# Patient Record
Sex: Female | Born: 1945 | Race: White | Hispanic: No | Smoking: Never smoker
Health system: Southern US, Community
[De-identification: ages and names within clinical notes are randomized; demographics above are authoritative.]

## PROBLEM LIST (undated history)

## (undated) DIAGNOSIS — I7300 Raynaud's syndrome without gangrene: Secondary | ICD-10-CM

## (undated) DIAGNOSIS — I781 Nevus, non-neoplastic: Secondary | ICD-10-CM

## (undated) DIAGNOSIS — I82409 Acute embolism and thrombosis of unspecified deep veins of unspecified lower extremity: Secondary | ICD-10-CM

## (undated) DIAGNOSIS — R51 Headache: Secondary | ICD-10-CM

## (undated) DIAGNOSIS — C4491 Basal cell carcinoma of skin, unspecified: Secondary | ICD-10-CM

## (undated) DIAGNOSIS — R519 Headache, unspecified: Secondary | ICD-10-CM

## (undated) DIAGNOSIS — Z8701 Personal history of pneumonia (recurrent): Secondary | ICD-10-CM

## (undated) DIAGNOSIS — R05 Cough: Secondary | ICD-10-CM

## (undated) DIAGNOSIS — I219 Acute myocardial infarction, unspecified: Secondary | ICD-10-CM

## (undated) DIAGNOSIS — E0590 Thyrotoxicosis, unspecified without thyrotoxic crisis or storm: Secondary | ICD-10-CM

## (undated) DIAGNOSIS — R112 Nausea with vomiting, unspecified: Secondary | ICD-10-CM

## (undated) DIAGNOSIS — K5090 Crohn's disease, unspecified, without complications: Secondary | ICD-10-CM

## (undated) DIAGNOSIS — M1990 Unspecified osteoarthritis, unspecified site: Secondary | ICD-10-CM

## (undated) DIAGNOSIS — I5022 Chronic systolic (congestive) heart failure: Secondary | ICD-10-CM

## (undated) DIAGNOSIS — Z9889 Other specified postprocedural states: Secondary | ICD-10-CM

## (undated) DIAGNOSIS — IMO0002 Reserved for concepts with insufficient information to code with codable children: Secondary | ICD-10-CM

## (undated) DIAGNOSIS — I341 Nonrheumatic mitral (valve) prolapse: Secondary | ICD-10-CM

## (undated) DIAGNOSIS — I5181 Takotsubo syndrome: Secondary | ICD-10-CM

## (undated) DIAGNOSIS — R053 Chronic cough: Secondary | ICD-10-CM

## (undated) DIAGNOSIS — I10 Essential (primary) hypertension: Secondary | ICD-10-CM

## (undated) HISTORY — PX: COLONOSCOPY: SHX174

## (undated) HISTORY — PX: SQUAMOUS CELL CARCINOMA EXCISION: SHX2433

## (undated) HISTORY — DX: Headache, unspecified: R51.9

## (undated) HISTORY — DX: Crohn's disease, unspecified, without complications: K50.90

## (undated) HISTORY — DX: Nevus, non-neoplastic: I78.1

## (undated) HISTORY — PX: CARDIAC CATHETERIZATION: SHX172

## (undated) HISTORY — DX: Raynaud's syndrome without gangrene: I73.00

## (undated) HISTORY — DX: Chronic systolic (congestive) heart failure: I50.22

## (undated) HISTORY — DX: Thyrotoxicosis, unspecified without thyrotoxic crisis or storm: E05.90

## (undated) HISTORY — DX: Chronic cough: R05.3

## (undated) HISTORY — DX: Personal history of pneumonia (recurrent): Z87.01

## (undated) HISTORY — DX: Takotsubo syndrome: I51.81

## (undated) HISTORY — PX: BASAL CELL CARCINOMA EXCISION: SHX1214

## (undated) HISTORY — DX: Headache: R51

## (undated) HISTORY — DX: Cough: R05

## (undated) HISTORY — DX: Essential (primary) hypertension: I10

## (undated) HISTORY — DX: Nonrheumatic mitral (valve) prolapse: I34.1

## (undated) HISTORY — DX: Acute embolism and thrombosis of unspecified deep veins of unspecified lower extremity: I82.409

---

## 1969-03-21 DIAGNOSIS — R112 Nausea with vomiting, unspecified: Secondary | ICD-10-CM

## 1969-03-21 DIAGNOSIS — Z9889 Other specified postprocedural states: Secondary | ICD-10-CM

## 1969-03-21 HISTORY — DX: Nausea with vomiting, unspecified: R11.2

## 1969-03-21 HISTORY — DX: Nausea with vomiting, unspecified: Z98.890

## 1969-07-21 HISTORY — PX: APPENDECTOMY: SHX54

## 1971-07-22 HISTORY — PX: TUBAL LIGATION: SHX77

## 1994-07-21 HISTORY — PX: SHOULDER ARTHROSCOPY W/ ROTATOR CUFF REPAIR: SHX2400

## 1998-03-21 ENCOUNTER — Ambulatory Visit (HOSPITAL_COMMUNITY): Admission: RE | Admit: 1998-03-21 | Discharge: 1998-03-21 | Payer: Self-pay | Admitting: Internal Medicine

## 1998-03-21 ENCOUNTER — Encounter: Payer: Self-pay | Admitting: Internal Medicine

## 1998-03-23 ENCOUNTER — Ambulatory Visit (HOSPITAL_BASED_OUTPATIENT_CLINIC_OR_DEPARTMENT_OTHER): Admission: RE | Admit: 1998-03-23 | Discharge: 1998-03-23 | Payer: Self-pay | Admitting: Plastic Surgery

## 1998-04-09 ENCOUNTER — Ambulatory Visit (HOSPITAL_BASED_OUTPATIENT_CLINIC_OR_DEPARTMENT_OTHER): Admission: RE | Admit: 1998-04-09 | Discharge: 1998-04-09 | Payer: Self-pay | Admitting: Plastic Surgery

## 1998-05-07 ENCOUNTER — Encounter (HOSPITAL_COMMUNITY): Admission: RE | Admit: 1998-05-07 | Discharge: 1998-08-05 | Payer: Self-pay | Admitting: Internal Medicine

## 1998-08-02 ENCOUNTER — Ambulatory Visit (HOSPITAL_COMMUNITY): Admission: RE | Admit: 1998-08-02 | Discharge: 1998-08-02 | Payer: Self-pay | Admitting: *Deleted

## 1998-09-24 ENCOUNTER — Ambulatory Visit (HOSPITAL_COMMUNITY): Admission: RE | Admit: 1998-09-24 | Discharge: 1998-09-24 | Payer: Self-pay | Admitting: Cardiology

## 1998-09-24 ENCOUNTER — Encounter: Payer: Self-pay | Admitting: Cardiology

## 1998-11-05 ENCOUNTER — Other Ambulatory Visit: Admission: RE | Admit: 1998-11-05 | Discharge: 1998-11-05 | Payer: Self-pay | Admitting: *Deleted

## 1998-12-05 ENCOUNTER — Ambulatory Visit (HOSPITAL_COMMUNITY): Admission: RE | Admit: 1998-12-05 | Discharge: 1998-12-05 | Payer: Self-pay | Admitting: Family Medicine

## 1998-12-20 ENCOUNTER — Ambulatory Visit (HOSPITAL_COMMUNITY): Admission: RE | Admit: 1998-12-20 | Discharge: 1998-12-20 | Payer: Self-pay | Admitting: Family Medicine

## 1999-04-01 ENCOUNTER — Encounter (HOSPITAL_COMMUNITY): Admission: RE | Admit: 1999-04-01 | Discharge: 1999-06-30 | Payer: Self-pay | Admitting: Internal Medicine

## 1999-04-24 ENCOUNTER — Ambulatory Visit (HOSPITAL_COMMUNITY): Admission: RE | Admit: 1999-04-24 | Discharge: 1999-04-24 | Payer: Self-pay | Admitting: Family Medicine

## 1999-04-24 ENCOUNTER — Encounter: Payer: Self-pay | Admitting: Family Medicine

## 1999-08-06 ENCOUNTER — Ambulatory Visit (HOSPITAL_COMMUNITY): Admission: RE | Admit: 1999-08-06 | Discharge: 1999-08-06 | Payer: Self-pay | Admitting: *Deleted

## 1999-08-06 ENCOUNTER — Encounter: Payer: Self-pay | Admitting: *Deleted

## 1999-10-25 ENCOUNTER — Ambulatory Visit (HOSPITAL_COMMUNITY): Admission: RE | Admit: 1999-10-25 | Discharge: 1999-10-25 | Payer: Self-pay | Admitting: Internal Medicine

## 1999-12-20 ENCOUNTER — Other Ambulatory Visit: Admission: RE | Admit: 1999-12-20 | Discharge: 1999-12-20 | Payer: Self-pay | Admitting: *Deleted

## 2000-05-16 ENCOUNTER — Ambulatory Visit (HOSPITAL_COMMUNITY): Admission: RE | Admit: 2000-05-16 | Discharge: 2000-05-16 | Payer: Self-pay | Admitting: Neurosurgery

## 2000-05-16 ENCOUNTER — Encounter: Payer: Self-pay | Admitting: Neurosurgery

## 2000-06-04 ENCOUNTER — Ambulatory Visit (HOSPITAL_COMMUNITY): Admission: RE | Admit: 2000-06-04 | Discharge: 2000-06-04 | Payer: Self-pay | Admitting: Neurosurgery

## 2000-06-04 ENCOUNTER — Encounter: Payer: Self-pay | Admitting: Neurosurgery

## 2000-08-07 ENCOUNTER — Encounter: Payer: Self-pay | Admitting: General Surgery

## 2000-08-07 ENCOUNTER — Ambulatory Visit (HOSPITAL_COMMUNITY): Admission: RE | Admit: 2000-08-07 | Discharge: 2000-08-07 | Payer: Self-pay | Admitting: General Surgery

## 2000-09-25 ENCOUNTER — Ambulatory Visit (HOSPITAL_COMMUNITY): Admission: RE | Admit: 2000-09-25 | Discharge: 2000-09-25 | Payer: Self-pay | Admitting: Cardiology

## 2000-09-25 ENCOUNTER — Encounter: Payer: Self-pay | Admitting: Cardiology

## 2000-10-06 ENCOUNTER — Ambulatory Visit (HOSPITAL_COMMUNITY): Admission: RE | Admit: 2000-10-06 | Discharge: 2000-10-06 | Payer: Self-pay | Admitting: Cardiology

## 2001-01-14 ENCOUNTER — Other Ambulatory Visit: Admission: RE | Admit: 2001-01-14 | Discharge: 2001-01-14 | Payer: Self-pay | Admitting: *Deleted

## 2001-10-29 ENCOUNTER — Ambulatory Visit (HOSPITAL_COMMUNITY): Admission: RE | Admit: 2001-10-29 | Discharge: 2001-10-29 | Payer: Self-pay | Admitting: *Deleted

## 2001-10-29 ENCOUNTER — Encounter: Payer: Self-pay | Admitting: *Deleted

## 2002-03-09 ENCOUNTER — Other Ambulatory Visit: Admission: RE | Admit: 2002-03-09 | Discharge: 2002-03-09 | Payer: Self-pay | Admitting: *Deleted

## 2002-07-08 ENCOUNTER — Encounter: Payer: Self-pay | Admitting: Neurosurgery

## 2002-07-08 ENCOUNTER — Ambulatory Visit (HOSPITAL_COMMUNITY): Admission: RE | Admit: 2002-07-08 | Discharge: 2002-07-08 | Payer: Self-pay | Admitting: Neurosurgery

## 2002-09-15 ENCOUNTER — Encounter: Payer: Self-pay | Admitting: Orthopedic Surgery

## 2002-09-15 ENCOUNTER — Ambulatory Visit (HOSPITAL_COMMUNITY): Admission: RE | Admit: 2002-09-15 | Discharge: 2002-09-15 | Payer: Self-pay | Admitting: Orthopedic Surgery

## 2002-10-10 ENCOUNTER — Ambulatory Visit (HOSPITAL_BASED_OUTPATIENT_CLINIC_OR_DEPARTMENT_OTHER): Admission: RE | Admit: 2002-10-10 | Discharge: 2002-10-10 | Payer: Self-pay | Admitting: Orthopedic Surgery

## 2002-11-13 ENCOUNTER — Encounter: Payer: Self-pay | Admitting: Orthopedic Surgery

## 2002-11-13 ENCOUNTER — Ambulatory Visit (HOSPITAL_COMMUNITY): Admission: RE | Admit: 2002-11-13 | Discharge: 2002-11-13 | Payer: Self-pay | Admitting: Orthopedic Surgery

## 2003-03-01 ENCOUNTER — Ambulatory Visit (HOSPITAL_COMMUNITY): Admission: RE | Admit: 2003-03-01 | Discharge: 2003-03-01 | Payer: Self-pay | Admitting: *Deleted

## 2003-03-01 ENCOUNTER — Encounter: Payer: Self-pay | Admitting: *Deleted

## 2003-03-24 ENCOUNTER — Ambulatory Visit (HOSPITAL_COMMUNITY): Admission: RE | Admit: 2003-03-24 | Discharge: 2003-03-24 | Payer: Self-pay | Admitting: Internal Medicine

## 2003-03-24 ENCOUNTER — Encounter (INDEPENDENT_AMBULATORY_CARE_PROVIDER_SITE_OTHER): Payer: Self-pay | Admitting: Specialist

## 2003-04-14 ENCOUNTER — Other Ambulatory Visit: Admission: RE | Admit: 2003-04-14 | Discharge: 2003-04-14 | Payer: Self-pay | Admitting: *Deleted

## 2004-02-16 ENCOUNTER — Emergency Department (HOSPITAL_COMMUNITY): Admission: EM | Admit: 2004-02-16 | Discharge: 2004-02-16 | Payer: Self-pay | Admitting: Family Medicine

## 2004-04-30 ENCOUNTER — Ambulatory Visit (HOSPITAL_COMMUNITY): Admission: RE | Admit: 2004-04-30 | Discharge: 2004-04-30 | Payer: Self-pay | Admitting: Obstetrics and Gynecology

## 2004-05-06 ENCOUNTER — Encounter: Admission: RE | Admit: 2004-05-06 | Discharge: 2004-05-06 | Payer: Self-pay | Admitting: Obstetrics and Gynecology

## 2004-08-27 ENCOUNTER — Other Ambulatory Visit: Admission: RE | Admit: 2004-08-27 | Discharge: 2004-08-27 | Payer: Self-pay | Admitting: Obstetrics and Gynecology

## 2005-01-22 ENCOUNTER — Encounter: Admission: RE | Admit: 2005-01-22 | Discharge: 2005-01-22 | Payer: Self-pay | Admitting: Family Medicine

## 2005-01-22 ENCOUNTER — Ambulatory Visit: Payer: Self-pay | Admitting: Family Medicine

## 2005-01-23 ENCOUNTER — Ambulatory Visit: Payer: Self-pay | Admitting: Internal Medicine

## 2005-01-23 ENCOUNTER — Encounter: Admission: RE | Admit: 2005-01-23 | Discharge: 2005-01-23 | Payer: Self-pay | Admitting: Internal Medicine

## 2005-01-23 ENCOUNTER — Ambulatory Visit: Payer: Self-pay | Admitting: Family Medicine

## 2005-01-28 ENCOUNTER — Ambulatory Visit: Payer: Self-pay | Admitting: Internal Medicine

## 2005-02-11 ENCOUNTER — Inpatient Hospital Stay (HOSPITAL_COMMUNITY): Admission: EM | Admit: 2005-02-11 | Discharge: 2005-02-12 | Payer: Self-pay | Admitting: Emergency Medicine

## 2005-03-12 ENCOUNTER — Encounter (INDEPENDENT_AMBULATORY_CARE_PROVIDER_SITE_OTHER): Payer: Self-pay | Admitting: *Deleted

## 2005-03-12 ENCOUNTER — Ambulatory Visit: Payer: Self-pay | Admitting: Internal Medicine

## 2005-03-12 ENCOUNTER — Ambulatory Visit (HOSPITAL_COMMUNITY): Admission: RE | Admit: 2005-03-12 | Discharge: 2005-03-12 | Payer: Self-pay | Admitting: Internal Medicine

## 2005-07-01 ENCOUNTER — Ambulatory Visit (HOSPITAL_COMMUNITY): Admission: RE | Admit: 2005-07-01 | Discharge: 2005-07-01 | Payer: Self-pay | Admitting: Obstetrics and Gynecology

## 2005-11-18 ENCOUNTER — Inpatient Hospital Stay (HOSPITAL_COMMUNITY): Admission: EM | Admit: 2005-11-18 | Discharge: 2005-11-25 | Payer: Self-pay | Admitting: *Deleted

## 2005-11-19 ENCOUNTER — Encounter (INDEPENDENT_AMBULATORY_CARE_PROVIDER_SITE_OTHER): Payer: Self-pay | Admitting: *Deleted

## 2005-12-04 ENCOUNTER — Encounter (HOSPITAL_COMMUNITY): Admission: RE | Admit: 2005-12-04 | Discharge: 2006-02-17 | Payer: Self-pay | Admitting: Cardiology

## 2005-12-10 ENCOUNTER — Ambulatory Visit (HOSPITAL_COMMUNITY): Admission: RE | Admit: 2005-12-10 | Discharge: 2005-12-10 | Payer: Self-pay | Admitting: Cardiology

## 2005-12-10 ENCOUNTER — Encounter (INDEPENDENT_AMBULATORY_CARE_PROVIDER_SITE_OTHER): Payer: Self-pay | Admitting: Cardiology

## 2005-12-18 ENCOUNTER — Emergency Department (HOSPITAL_COMMUNITY): Admission: EM | Admit: 2005-12-18 | Discharge: 2005-12-18 | Payer: Self-pay | Admitting: Emergency Medicine

## 2006-02-23 ENCOUNTER — Encounter (HOSPITAL_COMMUNITY): Admission: RE | Admit: 2006-02-23 | Discharge: 2006-05-21 | Payer: Self-pay | Admitting: Cardiology

## 2006-04-29 ENCOUNTER — Encounter (INDEPENDENT_AMBULATORY_CARE_PROVIDER_SITE_OTHER): Payer: Self-pay | Admitting: *Deleted

## 2006-04-29 ENCOUNTER — Ambulatory Visit (HOSPITAL_COMMUNITY): Admission: RE | Admit: 2006-04-29 | Discharge: 2006-04-29 | Payer: Self-pay | Admitting: Cardiology

## 2006-05-22 ENCOUNTER — Encounter (HOSPITAL_COMMUNITY): Admission: RE | Admit: 2006-05-22 | Discharge: 2006-08-20 | Payer: Self-pay | Admitting: Cardiology

## 2006-07-23 ENCOUNTER — Ambulatory Visit (HOSPITAL_COMMUNITY): Admission: RE | Admit: 2006-07-23 | Discharge: 2006-07-23 | Payer: Self-pay | Admitting: Obstetrics and Gynecology

## 2006-08-21 ENCOUNTER — Encounter (HOSPITAL_COMMUNITY): Admission: RE | Admit: 2006-08-21 | Discharge: 2006-11-19 | Payer: Self-pay | Admitting: Cardiology

## 2006-08-28 ENCOUNTER — Ambulatory Visit (HOSPITAL_COMMUNITY): Admission: RE | Admit: 2006-08-28 | Discharge: 2006-08-28 | Payer: Self-pay | Admitting: Neurology

## 2006-08-28 ENCOUNTER — Emergency Department (HOSPITAL_COMMUNITY): Admission: EM | Admit: 2006-08-28 | Discharge: 2006-08-28 | Payer: Self-pay | Admitting: Emergency Medicine

## 2006-11-20 ENCOUNTER — Encounter (HOSPITAL_COMMUNITY): Admission: RE | Admit: 2006-11-20 | Discharge: 2007-02-18 | Payer: Self-pay | Admitting: Cardiology

## 2006-12-30 ENCOUNTER — Ambulatory Visit (HOSPITAL_COMMUNITY): Admission: RE | Admit: 2006-12-30 | Discharge: 2006-12-30 | Payer: Self-pay | Admitting: Cardiology

## 2007-02-19 ENCOUNTER — Encounter (HOSPITAL_COMMUNITY): Admission: RE | Admit: 2007-02-19 | Discharge: 2007-05-20 | Payer: Self-pay | Admitting: Cardiology

## 2007-04-01 ENCOUNTER — Ambulatory Visit (HOSPITAL_COMMUNITY): Admission: RE | Admit: 2007-04-01 | Discharge: 2007-04-01 | Payer: Self-pay | Admitting: Cardiology

## 2007-05-22 ENCOUNTER — Encounter (HOSPITAL_COMMUNITY): Admission: RE | Admit: 2007-05-22 | Discharge: 2007-07-20 | Payer: Self-pay | Admitting: Cardiology

## 2007-07-22 ENCOUNTER — Encounter (HOSPITAL_COMMUNITY): Admission: RE | Admit: 2007-07-22 | Discharge: 2007-10-14 | Payer: Self-pay | Admitting: Cardiology

## 2007-08-03 ENCOUNTER — Ambulatory Visit (HOSPITAL_COMMUNITY): Admission: RE | Admit: 2007-08-03 | Discharge: 2007-08-03 | Payer: Self-pay | Admitting: Obstetrics and Gynecology

## 2007-08-17 ENCOUNTER — Encounter: Admission: RE | Admit: 2007-08-17 | Discharge: 2007-11-02 | Payer: Self-pay | Admitting: Internal Medicine

## 2007-10-12 ENCOUNTER — Ambulatory Visit: Payer: Self-pay | Admitting: Internal Medicine

## 2007-10-20 ENCOUNTER — Encounter (HOSPITAL_COMMUNITY): Admission: RE | Admit: 2007-10-20 | Discharge: 2008-01-18 | Payer: Self-pay | Admitting: Cardiology

## 2007-10-25 ENCOUNTER — Encounter: Payer: Self-pay | Admitting: Internal Medicine

## 2007-10-25 LAB — CONVERTED CEMR LAB: Hgb A1c MFr Bld: 5.9 % (ref 4.6–6.1)

## 2008-01-19 ENCOUNTER — Encounter (HOSPITAL_COMMUNITY): Admission: RE | Admit: 2008-01-19 | Discharge: 2008-04-18 | Payer: Self-pay | Admitting: Cardiology

## 2008-02-21 ENCOUNTER — Ambulatory Visit: Payer: Self-pay | Admitting: Family Medicine

## 2008-02-21 DIAGNOSIS — J029 Acute pharyngitis, unspecified: Secondary | ICD-10-CM

## 2008-02-21 DIAGNOSIS — K219 Gastro-esophageal reflux disease without esophagitis: Secondary | ICD-10-CM

## 2008-02-21 DIAGNOSIS — E0590 Thyrotoxicosis, unspecified without thyrotoxic crisis or storm: Secondary | ICD-10-CM | POA: Insufficient documentation

## 2008-04-20 ENCOUNTER — Encounter (HOSPITAL_COMMUNITY): Admission: RE | Admit: 2008-04-20 | Discharge: 2008-07-17 | Payer: Self-pay | Admitting: Cardiology

## 2008-05-24 ENCOUNTER — Telehealth: Payer: Self-pay | Admitting: Family Medicine

## 2008-07-21 ENCOUNTER — Encounter (HOSPITAL_COMMUNITY): Admission: RE | Admit: 2008-07-21 | Discharge: 2008-10-19 | Payer: Self-pay | Admitting: Cardiology

## 2008-08-17 ENCOUNTER — Ambulatory Visit (HOSPITAL_COMMUNITY): Admission: RE | Admit: 2008-08-17 | Discharge: 2008-08-17 | Payer: Self-pay | Admitting: Obstetrics and Gynecology

## 2008-10-20 ENCOUNTER — Encounter (HOSPITAL_COMMUNITY): Admission: RE | Admit: 2008-10-20 | Discharge: 2009-01-18 | Payer: Self-pay | Admitting: Cardiology

## 2008-12-01 ENCOUNTER — Ambulatory Visit: Payer: Self-pay | Admitting: Vascular Surgery

## 2008-12-04 ENCOUNTER — Telehealth: Payer: Self-pay | Admitting: Internal Medicine

## 2009-01-08 ENCOUNTER — Ambulatory Visit: Payer: Self-pay | Admitting: Internal Medicine

## 2009-01-16 ENCOUNTER — Ambulatory Visit: Payer: Self-pay | Admitting: Internal Medicine

## 2009-01-16 ENCOUNTER — Encounter: Payer: Self-pay | Admitting: Internal Medicine

## 2009-01-16 ENCOUNTER — Ambulatory Visit (HOSPITAL_COMMUNITY): Admission: RE | Admit: 2009-01-16 | Discharge: 2009-01-16 | Payer: Self-pay | Admitting: Internal Medicine

## 2009-01-17 ENCOUNTER — Encounter: Payer: Self-pay | Admitting: Internal Medicine

## 2009-01-19 ENCOUNTER — Encounter (HOSPITAL_COMMUNITY): Admission: RE | Admit: 2009-01-19 | Discharge: 2009-04-19 | Payer: Self-pay | Admitting: Cardiology

## 2009-04-12 ENCOUNTER — Encounter: Payer: Self-pay | Admitting: *Deleted

## 2009-04-20 ENCOUNTER — Encounter (HOSPITAL_COMMUNITY): Admission: RE | Admit: 2009-04-20 | Discharge: 2009-07-19 | Payer: Self-pay | Admitting: Cardiology

## 2009-04-27 DIAGNOSIS — Z8679 Personal history of other diseases of the circulatory system: Secondary | ICD-10-CM

## 2009-04-27 DIAGNOSIS — J302 Other seasonal allergic rhinitis: Secondary | ICD-10-CM

## 2009-04-27 DIAGNOSIS — I059 Rheumatic mitral valve disease, unspecified: Secondary | ICD-10-CM | POA: Insufficient documentation

## 2009-04-27 DIAGNOSIS — Z8672 Personal history of thrombophlebitis: Secondary | ICD-10-CM | POA: Insufficient documentation

## 2009-04-27 DIAGNOSIS — I789 Disease of capillaries, unspecified: Secondary | ICD-10-CM | POA: Insufficient documentation

## 2009-04-30 ENCOUNTER — Ambulatory Visit: Payer: Self-pay | Admitting: Cardiovascular Disease

## 2009-04-30 DIAGNOSIS — I5181 Takotsubo syndrome: Secondary | ICD-10-CM

## 2009-07-21 ENCOUNTER — Encounter (HOSPITAL_COMMUNITY): Admission: RE | Admit: 2009-07-21 | Discharge: 2009-10-19 | Payer: Self-pay | Admitting: Cardiology

## 2009-08-10 ENCOUNTER — Ambulatory Visit: Payer: Self-pay | Admitting: Cardiovascular Disease

## 2009-08-14 LAB — CONVERTED CEMR LAB
AST: 19 units/L (ref 0–37)
Alkaline Phosphatase: 49 units/L (ref 39–117)
BUN: 14 mg/dL (ref 6–23)
Chloride: 104 meq/L (ref 96–112)
Creatinine, Ser: 1 mg/dL (ref 0.4–1.2)
Glucose, Bld: 89 mg/dL (ref 70–99)
LDL Cholesterol: 106 mg/dL — ABNORMAL HIGH (ref 0–99)
Total CHOL/HDL Ratio: 3
Total Protein: 7.2 g/dL (ref 6.0–8.3)
VLDL: 8.2 mg/dL (ref 0.0–40.0)

## 2009-08-16 ENCOUNTER — Ambulatory Visit: Payer: Self-pay | Admitting: Vascular Surgery

## 2009-08-21 ENCOUNTER — Ambulatory Visit (HOSPITAL_COMMUNITY): Admission: RE | Admit: 2009-08-21 | Discharge: 2009-08-21 | Payer: Self-pay | Admitting: Obstetrics and Gynecology

## 2009-10-20 ENCOUNTER — Encounter (HOSPITAL_COMMUNITY): Admission: RE | Admit: 2009-10-20 | Discharge: 2010-01-18 | Payer: Self-pay | Admitting: Cardiology

## 2009-11-02 ENCOUNTER — Ambulatory Visit: Payer: Self-pay | Admitting: Family Medicine

## 2010-01-14 ENCOUNTER — Telehealth: Payer: Self-pay | Admitting: Family Medicine

## 2010-01-19 ENCOUNTER — Encounter (HOSPITAL_COMMUNITY): Admission: RE | Admit: 2010-01-19 | Discharge: 2010-04-19 | Payer: Self-pay | Admitting: Cardiology

## 2010-04-20 ENCOUNTER — Encounter (HOSPITAL_COMMUNITY)
Admission: RE | Admit: 2010-04-20 | Discharge: 2010-07-19 | Payer: Self-pay | Source: Home / Self Care | Attending: Cardiovascular Disease | Admitting: Cardiovascular Disease

## 2010-05-17 ENCOUNTER — Ambulatory Visit: Payer: Self-pay | Admitting: Cardiovascular Disease

## 2010-05-17 ENCOUNTER — Encounter: Payer: Self-pay | Admitting: Cardiovascular Disease

## 2010-05-17 DIAGNOSIS — I5181 Takotsubo syndrome: Secondary | ICD-10-CM

## 2010-07-23 ENCOUNTER — Encounter (HOSPITAL_COMMUNITY)
Admission: RE | Admit: 2010-07-23 | Discharge: 2010-08-20 | Payer: Self-pay | Source: Home / Self Care | Attending: Cardiovascular Disease | Admitting: Cardiovascular Disease

## 2010-08-10 ENCOUNTER — Encounter: Payer: Self-pay | Admitting: Obstetrics and Gynecology

## 2010-08-12 ENCOUNTER — Other Ambulatory Visit (HOSPITAL_COMMUNITY): Payer: Self-pay | Admitting: Obstetrics and Gynecology

## 2010-08-12 DIAGNOSIS — Z1231 Encounter for screening mammogram for malignant neoplasm of breast: Secondary | ICD-10-CM

## 2010-08-12 DIAGNOSIS — Z0000 Encounter for general adult medical examination without abnormal findings: Secondary | ICD-10-CM

## 2010-08-19 ENCOUNTER — Telehealth: Payer: Self-pay | Admitting: Internal Medicine

## 2010-08-20 ENCOUNTER — Ambulatory Visit
Admission: RE | Admit: 2010-08-20 | Discharge: 2010-08-20 | Payer: Self-pay | Source: Home / Self Care | Attending: Cardiovascular Disease | Admitting: Cardiovascular Disease

## 2010-08-20 ENCOUNTER — Other Ambulatory Visit: Payer: Self-pay | Admitting: Cardiovascular Disease

## 2010-08-20 LAB — BASIC METABOLIC PANEL
CO2: 32 mEq/L (ref 19–32)
Calcium: 9.5 mg/dL (ref 8.4–10.5)
Chloride: 102 mEq/L (ref 96–112)
Creatinine, Ser: 0.8 mg/dL (ref 0.4–1.2)
Sodium: 141 mEq/L (ref 135–145)

## 2010-08-20 LAB — LIPID PANEL
Cholesterol: 191 mg/dL (ref 0–200)
HDL: 78.8 mg/dL (ref >39.00)
LDL Cholesterol: 101 mg/dL — ABNORMAL HIGH (ref 0–99)
Total CHOL/HDL Ratio: 2
Triglycerides: 54 mg/dL (ref 0.0–149.0)

## 2010-08-20 LAB — HEPATIC FUNCTION PANEL
AST: 19 U/L (ref 0–37)
Albumin: 4.2 g/dL (ref 3.5–5.2)
Alkaline Phosphatase: 43 U/L (ref 39–117)
Total Protein: 6.6 g/dL (ref 6.0–8.3)

## 2010-08-20 NOTE — Assessment & Plan Note (Signed)
Summary: cough/congestion and fever/cjr   Vital Signs:  Patient profile:   65 year old female Weight:      122 pounds Temp:     98.0 degrees F oral BP sitting:   118 / 78 Cuff size:   regular  Vitals Entered By: Kern Reap CMA Duncan Dull) (November 02, 2009 11:48 AM) CC: fever, cough, congestion   Primary Care Provider:  Roderick Pee MD  CC:  fever, cough, and congestion.  History of Present Illness: Kirsten Rice is a  65 year old, married female, nonsmoker retired Engineer, civil (consulting) who comes in today for evaluation of fever, chills, headache, nonproductive cough x 3 days.  Review of systems otherwise negative.  No contact with anybody has been ill.  Allergies: 1)  ! Compazine 2)  ! * Td 3)  ! * Caffergote  Past History:  Past medical, surgical, family and social histories (including risk factors) reviewed for relevance to current acute and chronic problems.  Past Medical History: Reviewed history from 04/30/2009 and no changes required. 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 Right shoulder surgery 1996 Bilateral tubal ligation 1973 Appendectomy 1971  Family History: Reviewed history from 04/30/2009 and no changes required. Negative for premature CAD Father: deceased 67, aortic stenosis Mother:deceased 53, respiratory Siblings: 1 brother deceased infant, pre-mature 5 sisters: 1 deceased infant, pre-mature, 4 alive 42, 77, 40, 43  Social History: Reviewed history from 04/27/2009 and no changes required.  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.   divorced and remarried-  one daughter  She is  a nonsmoker No alcohol use No drug use  Review of Systems      See HPI  Physical Exam  General:  Well-developed,well-nourished,in no acute distress; alert,appropriate and cooperative throughout examination Head:  Normocephalic and atraumatic without obvious abnormalities. No apparent alopecia or balding. Eyes:  No corneal or conjunctival inflammation noted. EOMI. Perrla. Funduscopic exam benign, without hemorrhages, exudates or papilledema. Vision grossly normal. Ears:  External ear exam shows no significant lesions or deformities.  Otoscopic examination reveals clear canals, tympanic membranes are intact bilaterally without bulging, retraction, inflammation or discharge. Hearing is grossly normal bilaterally. Nose:  External nasal examination shows no deformity or inflammation. Nasal mucosa are pink and moist without lesions or exudates. Mouth:  Oral mucosa and oropharynx without lesions or exudates.  Teeth in good repair. Neck:  No deformities, masses, or tenderness noted. Lungs:  Normal respiratory effort, chest expands symmetrically. Lungs are clear to auscultation, no crackles or wheezes.   Problems:  Medical Problems Added: 1)  Dx of Viral Infection-unspec  (ICD-079.99)  Impression & Recommendations:  Problem # 1:  VIRAL INFECTION-UNSPEC (ICD-079.99) Assessment New  Her updated medication list for this problem includes:    Adult Aspirin Low Strength 81 Mg Tbdp (Aspirin) ..... Once daily    Hydromet 5-1.5 Mg/47ml Syrp (Hydrocodone-homatropine) .Marland Kitchen... 1 or 2 tsps three times a day as needed  Complete Medication List: 1)  Lotensin 10 Mg Tabs (Benazepril hcl) .... Once daily 2)  Protonix 40 Mg Tbec (Pantoprazole sodium) .... Once daily 3)  Furosemide 20 Mg Tabs (Furosemide) .... Once daily 4)  Potassium Chloride Crys Cr 20 Meq Cr-tabs (Potassium chloride crys cr) .... Take one tablet by mouth daily 5)  Fish Oil 1200 Mg Caps (Omega-3 fatty acids) .... Once daily 6)  Adult  Aspirin Low Strength 81 Mg Tbdp (Aspirin) .... Once daily 7)  Calcium 1500 Mg Tabs (Calcium carbonate) .... Once daily 8)  Colazal 750 Mg Caps (Balsalazide disodium) .... Take 3 capsules by mouth two times a day 9)  Vitamin C 500 Mg Tabs (Ascorbic acid) .Marland Kitchen.. 1 tab once daily 10)  Vitamin D 1000 Unit Tabs (Cholecalciferol) .Marland Kitchen.. 1 tab once daily 11)  Vitamin E 400 Unit Caps (Vitamin e) .Marland Kitchen.. 1 cap once daily 12)  Hydromet 5-1.5 Mg/58ml Syrp (Hydrocodone-homatropine) .Marland Kitchen.. 1 or 2 tsps three times a day as needed  Patient Instructions: 1)  drank 20 to 30 ounces of water daily, Tylenol for your fever, and chills, Hydromet one half to 1 teaspoon 3 to 4 times a day as needed for flu symptoms.  Return p.r.n. 2)  set up a time in May for a general physical exam Prescriptions: HYDROMET 5-1.5 MG/5ML SYRP (HYDROCODONE-HOMATROPINE) 1 or 2 tsps three times a day as needed  #8oz x 1   Entered and Authorized by:   Roderick Pee MD   Signed by:   Roderick Pee MD on 11/02/2009   Method used:   Print then Give to Patient   RxID:   [id number]

## 2010-08-20 NOTE — Progress Notes (Signed)
Summary: REQ FOR SHINGLES VACCINE  Phone Note Call from Patient   Caller: Patient   [phone] Reason for Call: Talk to Nurse, Talk to Doctor Summary of Call: Pt called to ck availability of Shingles (Zostavax) Vaccine.... Pt can be reached at [phone].  Initial call taken by: Debbra Riding,  January 14, 2010 10:01 AM  Follow-up for Phone Call        left message on machine for patient to let me know if she would like a rx called in or if she would like to come into the office. Follow-up by: Kern Reap CMA Duncan Dull),  January 14, 2010 1:35 PM    New/Updated Medications: ZOSTAVAX 27253 UNT/0.65ML SOLR (ZOSTER VACCINE LIVE) use as directed Prescriptions: ZOSTAVAX 66440 UNT/0.65ML SOLR (ZOSTER VACCINE LIVE) use as directed  #1 x 0   Entered by:   Kern Reap CMA (AAMA)   Authorized by:   Roderick Pee MD   Signed by:   Kern Reap CMA (AAMA) on 01/14/2010   Method used:   Electronically to        [facility] [phone]* (retail)       [address]       [address]       [place], [place]  [address]       Ph: [phone]       Fax: [phone]   RxID:   [id number]

## 2010-08-20 NOTE — Assessment & Plan Note (Signed)
Summary: [facility]  Medications Added DILT-CD 120 MG XR24H-CAP (DILTIAZEM HCL COATED BEADS) take one capsule by mouth daily FUROSEMIDE 20 MG  TABS (FUROSEMIDE) once daily POTASSIUM CHLORIDE CRYS CR 20 MEQ CR-TABS (POTASSIUM CHLORIDE CRYS CR) Take one tablet by mouth daily CALCIUM CARBONATE 600 MG TABS (CALCIUM CARBONATE) Take 1 tablet by mouth two times a day        Visit Type:  1 year follow up Primary Provider:  Roderick Pee MD  CC:  none.  History of Present Illness: This is a 65 year old woman with history of Tako-tsubo's cardiomyopathy who presents today for followup evaluation. She has previously followed with Dr. Elsie Lincoln, and presents today to establish care. Her initial event dates back to 2007, when she was involved in a stressful meeting at the hospital.  She developed chest and neck pain immediately after the meeting. She was found to have ST elevation on EKG, and was brought emergently for cardiac catheterization. This demonstrated widely patent coronary arteries and left ventriculography consistent with LV apical ballooning.  She continues to exercises at least 3 days per week. Only complaint is Raynaud's...fingers worse than toes. No chest pain or dyspnea. No other complaints.  Current Medications (verified): 1)  Lotensin 10 Mg  Tabs (Benazepril Hcl) .... Once Daily 2)  Protonix 40 Mg  Tbec (Pantoprazole Sodium) .... Once Daily 3)  Furosemide 20 Mg  Tabs (Furosemide) .... Once Daily 4)  Potassium Chloride Crys Cr 20 Meq Cr-Tabs (Potassium Chloride Crys Cr) .... Take One Tablet By Mouth Daily 5)  Fish Oil 1200 Mg  Caps (Omega-3 Fatty Acids) .... Once Daily 6)  Adult Aspirin Low Strength 81 Mg  Tbdp (Aspirin) .... Once Daily 7)  Calcium Carbonate 600 Mg Tabs (Calcium Carbonate) .... Take 1 Tablet By Mouth Two Times A Day 8)  Colazal 750 Mg Caps (Balsalazide Disodium) .... Take 3 Capsules By Mouth Two Times A Day 9)  Vitamin C 500 Mg Tabs (Ascorbic Acid) .Marland Kitchen.. 1 Tab  Once Daily 10)  Vitamin D 1000 Unit Tabs (Cholecalciferol) .Marland Kitchen.. 1 Tab Once Daily 11)  Vitamin E 400 Unit Caps (Vitamin E) .Marland Kitchen.. 1 Cap Once Daily  Allergies: 1)  ! Compazine 2)  ! * Td 3)  ! * Caffergote  Past History:  Past medical history reviewed for relevance to current acute and chronic problems.  Past Medical History: Reviewed history from 04/30/2009 and no changes required. 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 HEENT: normal Neck: normal carotid upstrokes without bruits, JVP normal Lungs: CTA CV: RRR without murmur or gallop Abd: soft, NT, positive BS, no bruit, no organomegaly Ext: no clubbing, cyanosis, or edema. peripheral pulses 2+ and equal Skin: warm and dry  without rash    EKG  Procedure date:  05/17/2010  Findings:      NSR 72 bpm, within normal limits.  Impression & Recommendations:  Problem # 1:  TAKOTSUBO SYNDROME (ICD-429.83) No recurrent chest pain. LV has normalized. Continue to follow yearly.  Problem # 2:  RAYNAUD'S SYNDROME, HX OF (ICD-V12.59) Pt's BP is always low-normal range, even when she forgets her lotensin. Recommend d/c lotensin and give a trial of low-dose diltiazem to see if it helps with her Raynaud's.  Her updated  medication list for this problem includes:    Dilt-cd 120 Mg Xr24h-cap (Diltiazem hcl coated beads) .Marland Kitchen... Take one capsule by mouth daily    Adult Aspirin Low Strength 81 Mg Tbdp (Aspirin) ..... Once daily  Other Orders: EKG w/ Interpretation (93000)  Patient Instructions: 1)  Your physician recommends that you return for a FASTING Lipid, BMP and Liver Profile in January 2012---Nothing to eat or drink after midnight--lab opens at 8:30, labs the week of 07/29/10 2)  Your physician has recommended you make the following change in your medication: STOP Lotensin, START Diltiazem CD 120mg  one a day  3)  Your physician wants you to follow-up in:   1 YEAR. You will receive a reminder letter in the mail two months in advance. If you don't receive a letter, please call our office to schedule the follow-up appointment. Prescriptions: POTASSIUM CHLORIDE CRYS CR 20 MEQ CR-TABS (POTASSIUM CHLORIDE CRYS CR) Take one tablet by mouth daily  #90 x 3   Entered by:   Julieta Gutting, RN, BSN   Authorized by:   Norva Karvonen, MD   Signed by:   Julieta Gutting, RN, BSN on 05/17/2010   Method used:   Electronically to        [facility] # [phone]* (retail)       [address]       [place], [place]  [address]       Ph: [phone]       Fax: [phone]   RxID:   [id number] FUROSEMIDE 20 MG  TABS (FUROSEMIDE) once daily  #90 x 3   Entered by:   Julieta Gutting, RN, BSN   Authorized by:   Norva Karvonen, MD   Signed by:   Julieta Gutting, RN, BSN on 05/17/2010   Method used:   Electronically to        [facility] # [phone]* (retail)       [address]       [place], [place]  [address]       Ph: [phone]       Fax: [phone]   RxID:   [id number] DILT-CD 120 MG XR24H-CAP (DILTIAZEM HCL COATED BEADS) take one capsule by mouth daily  #90 x 3   Entered by:   Julieta Gutting, RN, BSN   Authorized by:   Norva Karvonen, MD   Signed by:   Julieta Gutting, RN, BSN on 05/17/2010   Method  used:   Electronically to        [facility] # [phone]* (retail)       [address]       [place], [place]  [address]       Ph: [phone]       Fax: [phone]   RxID:   [id number]

## 2010-08-21 ENCOUNTER — Telehealth: Payer: Self-pay | Admitting: Cardiovascular Disease

## 2010-08-22 ENCOUNTER — Ambulatory Visit (HOSPITAL_COMMUNITY)
Admission: RE | Admit: 2010-08-22 | Discharge: 2010-08-22 | Disposition: A | Discharge status: Home / Self Care | Payer: Medicare Other | Source: Ambulatory Visit | Attending: Obstetrics and Gynecology | Admitting: Obstetrics and Gynecology

## 2010-08-22 ENCOUNTER — Encounter (HOSPITAL_COMMUNITY): Payer: Self-pay | Attending: Cardiovascular Disease

## 2010-08-22 ENCOUNTER — Other Ambulatory Visit (HOSPITAL_COMMUNITY): Payer: Self-pay | Admitting: Obstetrics and Gynecology

## 2010-08-22 DIAGNOSIS — I252 Old myocardial infarction: Secondary | ICD-10-CM | POA: Insufficient documentation

## 2010-08-22 DIAGNOSIS — Z1231 Encounter for screening mammogram for malignant neoplasm of breast: Secondary | ICD-10-CM | POA: Insufficient documentation

## 2010-08-22 DIAGNOSIS — Z0000 Encounter for general adult medical examination without abnormal findings: Secondary | ICD-10-CM

## 2010-08-22 DIAGNOSIS — Z7901 Long term (current) use of anticoagulants: Secondary | ICD-10-CM | POA: Insufficient documentation

## 2010-08-22 DIAGNOSIS — Z1239 Encounter for other screening for malignant neoplasm of breast: Secondary | ICD-10-CM

## 2010-08-22 DIAGNOSIS — I471 Supraventricular tachycardia, unspecified: Secondary | ICD-10-CM | POA: Insufficient documentation

## 2010-08-22 DIAGNOSIS — I059 Rheumatic mitral valve disease, unspecified: Secondary | ICD-10-CM | POA: Insufficient documentation

## 2010-08-22 DIAGNOSIS — I10 Essential (primary) hypertension: Secondary | ICD-10-CM | POA: Insufficient documentation

## 2010-08-22 DIAGNOSIS — I5181 Takotsubo syndrome: Secondary | ICD-10-CM | POA: Insufficient documentation

## 2010-08-22 DIAGNOSIS — E785 Hyperlipidemia, unspecified: Secondary | ICD-10-CM | POA: Insufficient documentation

## 2010-08-22 DIAGNOSIS — Z5189 Encounter for other specified aftercare: Secondary | ICD-10-CM | POA: Insufficient documentation

## 2010-08-23 ENCOUNTER — Encounter (HOSPITAL_COMMUNITY): Payer: Self-pay

## 2010-08-27 ENCOUNTER — Encounter (HOSPITAL_COMMUNITY): Payer: Self-pay

## 2010-08-28 NOTE — Progress Notes (Signed)
Summary: Triage  Phone Note Call from Patient Call back at Home Phone [phone]   Caller: Patient Call For: Dr. Juanda Chance Reason for Call: Talk to Nurse Summary of Call: Saw her cardiologist and wants to know if she should continue taking her Protonix Initial call taken by: Karna Christmas,  August 19, 2010 12:17 PM  Follow-up for Phone Call        Message left for patient to call back.Jesse Fall RN  August 19, 2010 1:33 PM Spoke with patient. She had a heart attack 4 years ago and her cardiologist put her on Protonix 40 mg daily. She just saw her cardiologist and her told her she did not need Protonix from his stand point but to check with Dr. Juanda Chance and see if she wanted her to stay on it. Patient would like to stay on Protonix for reflux. Please, advise. Follow-up by: Jesse Fall RN,  August 19, 2010 2:55 PM  Additional Follow-up for Phone Call Additional follow up Details #1::        I have never treated the pt for GERD , she has chronic U.Colitis. If she is having GERD then it is OK to stay on a PPI. OK to refill. Additional Follow-up by: Hart Carwin MD,  August 20, 2010 12:39 PM    Additional Follow-up for Phone Call Additional follow up Details #2::    Spoke with patient. Rx to her pharmacy. Follow-up by: Jesse Fall RN,  August 20, 2010 3:00 PM  New/Updated Medications: PROTONIX 40 MG  TBEC (PANTOPRAZOLE SODIUM) once daily Prescriptions: PROTONIX 40 MG  TBEC (PANTOPRAZOLE SODIUM) once daily  #90 x 0   Entered by:   Jesse Fall RN   Authorized by:   Hart Carwin MD   Signed by:   Jesse Fall RN on 08/20/2010   Method used:   Electronically to        [facility] # [phone]* (retail)       [address]       [place], [place]  [address]       Ph: [phone]       Fax: [phone]   RxID:   [id number]

## 2010-08-28 NOTE — Progress Notes (Signed)
Summary: question on meds  Phone Note Call from Patient Call back at Home Phone [phone]   Caller: Patient Reason for Call: Talk to Nurse Summary of Call: pt has question re her blood pressure meds Initial call taken by: Roe Coombs,  August 21, 2010 11:17 AM  Follow-up for Phone Call        I spoke with the pt and she c/o lower extremity swelling, HA and increased BP since switching to Cardizem.  The pt was switched to Cardizem from Lotensin due to Raynaud's. The pt would like to know if she can go back to Lotensin but a lower dose (5mg ). I also reviewed the pt's lab results with her and she would like to continue diet and exercise.  The pt would like a copy of lab mailed to her home.  I will forward this message to Dr Excell Seltzer to review possible medication change.  Follow-up by: Julieta Gutting, RN, BSN,  August 21, 2010 1:24 PM  Additional Follow-up for Phone Call Additional follow up Details #1::        Fine to switch back to lotensin and stop cardizem.    Additional Follow-up by: Norva Karvonen, MD,  August 21, 2010 5:43 PM    Additional Follow-up for Phone Call Additional follow up Details #2::    Left message for pt to call back.  Labwork mailed to pt. Julieta Gutting, RN, BSN  August 22, 2010 10:13 AM  pt rtn call-pls call [phone] she has appt call before 1230 or after 200p Glynda Jaeger  August 22, 2010 11:35 AM  I spoke with the pt and she will D/C Cardizem and restart Lotensin 10mg  one-half daily.  The pt will monitor her BP at home.  Rx sent to pharmacy.  The pt requested a lower dose of Lotensin due to BP running on lower side of normal for pt.    Follow-up by: Julieta Gutting, RN, BSN,  August 22, 2010 11:54 AM  New/Updated Medications: LOTENSIN 10 MG TABS (BENAZEPRIL HCL) take one-half tablet by mouth daily Prescriptions: LOTENSIN 10 MG TABS (BENAZEPRIL HCL) take one-half tablet by mouth daily  #90 x 3   Entered by:   Julieta Gutting, RN, BSN  Authorized by:   Norva Karvonen, MD   Signed by:   Julieta Gutting, RN, BSN on 08/22/2010   Method used:   Electronically to        [facility] # [phone]* (retail)       [address]       [place], [place]  [address]       Ph: [phone]       Fax: [phone]   RxID:   [id number]

## 2010-08-29 ENCOUNTER — Encounter (HOSPITAL_COMMUNITY): Payer: Self-pay

## 2010-08-30 ENCOUNTER — Encounter (HOSPITAL_COMMUNITY): Payer: Self-pay

## 2010-09-03 ENCOUNTER — Encounter (HOSPITAL_COMMUNITY): Payer: Self-pay

## 2010-09-05 ENCOUNTER — Encounter (HOSPITAL_COMMUNITY): Payer: Self-pay

## 2010-09-06 ENCOUNTER — Encounter (HOSPITAL_COMMUNITY): Payer: Self-pay

## 2010-09-10 ENCOUNTER — Encounter (HOSPITAL_COMMUNITY): Payer: Self-pay | Attending: Cardiovascular Disease

## 2010-09-10 ENCOUNTER — Encounter (HOSPITAL_COMMUNITY): Payer: Self-pay

## 2010-09-11 ENCOUNTER — Encounter (HOSPITAL_COMMUNITY): Payer: Medicare Other | Attending: Cardiovascular Disease

## 2010-09-12 ENCOUNTER — Encounter (HOSPITAL_COMMUNITY): Payer: Self-pay

## 2010-09-13 ENCOUNTER — Encounter (HOSPITAL_COMMUNITY): Payer: Self-pay

## 2010-09-17 ENCOUNTER — Encounter (HOSPITAL_COMMUNITY): Payer: Self-pay

## 2010-09-18 ENCOUNTER — Encounter (HOSPITAL_COMMUNITY): Payer: Medicare Other | Attending: Cardiovascular Disease

## 2010-09-19 ENCOUNTER — Encounter (HOSPITAL_COMMUNITY): Payer: Self-pay

## 2010-09-19 ENCOUNTER — Encounter (HOSPITAL_COMMUNITY): Payer: Medicare Other

## 2010-09-20 ENCOUNTER — Encounter (HOSPITAL_COMMUNITY): Payer: Self-pay

## 2010-09-20 ENCOUNTER — Encounter (HOSPITAL_COMMUNITY): Payer: Medicare Other

## 2010-09-24 ENCOUNTER — Encounter (HOSPITAL_COMMUNITY): Payer: Self-pay

## 2010-09-24 ENCOUNTER — Encounter (HOSPITAL_COMMUNITY): Payer: Medicare Other

## 2010-09-24 ENCOUNTER — Encounter (HOSPITAL_COMMUNITY): Payer: Self-pay | Attending: Cardiovascular Disease

## 2010-09-24 DIAGNOSIS — I059 Rheumatic mitral valve disease, unspecified: Secondary | ICD-10-CM | POA: Insufficient documentation

## 2010-09-24 DIAGNOSIS — I10 Essential (primary) hypertension: Secondary | ICD-10-CM | POA: Insufficient documentation

## 2010-09-24 DIAGNOSIS — E785 Hyperlipidemia, unspecified: Secondary | ICD-10-CM | POA: Insufficient documentation

## 2010-09-24 DIAGNOSIS — Z7901 Long term (current) use of anticoagulants: Secondary | ICD-10-CM | POA: Insufficient documentation

## 2010-09-24 DIAGNOSIS — I5181 Takotsubo syndrome: Secondary | ICD-10-CM | POA: Insufficient documentation

## 2010-09-24 DIAGNOSIS — I471 Supraventricular tachycardia, unspecified: Secondary | ICD-10-CM | POA: Insufficient documentation

## 2010-09-24 DIAGNOSIS — Z5189 Encounter for other specified aftercare: Secondary | ICD-10-CM | POA: Insufficient documentation

## 2010-09-24 DIAGNOSIS — I252 Old myocardial infarction: Secondary | ICD-10-CM | POA: Insufficient documentation

## 2010-09-26 ENCOUNTER — Encounter (HOSPITAL_COMMUNITY): Payer: Self-pay

## 2010-09-26 ENCOUNTER — Encounter (HOSPITAL_COMMUNITY): Payer: Medicare Other

## 2010-09-27 ENCOUNTER — Encounter (HOSPITAL_COMMUNITY): Payer: Medicare Other

## 2010-09-27 ENCOUNTER — Encounter (HOSPITAL_COMMUNITY): Payer: Self-pay

## 2010-10-01 ENCOUNTER — Encounter (HOSPITAL_COMMUNITY): Payer: Self-pay

## 2010-10-01 ENCOUNTER — Encounter (HOSPITAL_COMMUNITY): Payer: Medicare Other

## 2010-10-03 ENCOUNTER — Encounter (HOSPITAL_COMMUNITY): Payer: Self-pay

## 2010-10-03 ENCOUNTER — Encounter (HOSPITAL_COMMUNITY): Payer: Medicare Other

## 2010-10-04 ENCOUNTER — Encounter (HOSPITAL_COMMUNITY): Payer: Medicare Other

## 2010-10-04 ENCOUNTER — Encounter (HOSPITAL_COMMUNITY): Payer: Self-pay

## 2010-10-08 ENCOUNTER — Encounter (HOSPITAL_COMMUNITY): Payer: Self-pay

## 2010-10-08 ENCOUNTER — Encounter (HOSPITAL_COMMUNITY): Payer: Medicare Other

## 2010-10-10 ENCOUNTER — Encounter (HOSPITAL_COMMUNITY): Payer: Self-pay

## 2010-10-10 ENCOUNTER — Encounter (HOSPITAL_COMMUNITY): Payer: Medicare Other

## 2010-10-11 ENCOUNTER — Encounter (HOSPITAL_COMMUNITY): Payer: Self-pay

## 2010-10-11 ENCOUNTER — Encounter (HOSPITAL_COMMUNITY): Payer: Medicare Other

## 2010-10-15 ENCOUNTER — Encounter (HOSPITAL_COMMUNITY): Payer: Self-pay

## 2010-10-15 ENCOUNTER — Encounter (HOSPITAL_COMMUNITY): Payer: Medicare Other

## 2010-10-17 ENCOUNTER — Encounter (HOSPITAL_COMMUNITY): Payer: Medicare Other

## 2010-10-17 ENCOUNTER — Encounter (HOSPITAL_COMMUNITY): Payer: Self-pay

## 2010-10-18 ENCOUNTER — Encounter (HOSPITAL_COMMUNITY): Payer: Self-pay

## 2010-10-18 ENCOUNTER — Encounter (HOSPITAL_COMMUNITY): Payer: Medicare Other

## 2010-10-21 ENCOUNTER — Telehealth: Payer: Self-pay | Admitting: Cardiovascular Disease

## 2010-10-21 DIAGNOSIS — E785 Hyperlipidemia, unspecified: Secondary | ICD-10-CM | POA: Insufficient documentation

## 2010-10-21 NOTE — Telephone Encounter (Signed)
Faxed All Cardiac over to [facility] @ [phone] 10/21/10/KM

## 2010-10-22 ENCOUNTER — Encounter (HOSPITAL_COMMUNITY): Payer: Self-pay | Attending: Cardiovascular Disease

## 2010-10-22 ENCOUNTER — Encounter (HOSPITAL_COMMUNITY): Payer: Self-pay

## 2010-10-22 ENCOUNTER — Encounter (HOSPITAL_COMMUNITY): Payer: Medicare Other

## 2010-10-22 DIAGNOSIS — Z7901 Long term (current) use of anticoagulants: Secondary | ICD-10-CM | POA: Insufficient documentation

## 2010-10-22 DIAGNOSIS — I471 Supraventricular tachycardia, unspecified: Secondary | ICD-10-CM | POA: Insufficient documentation

## 2010-10-22 DIAGNOSIS — I252 Old myocardial infarction: Secondary | ICD-10-CM | POA: Insufficient documentation

## 2010-10-22 DIAGNOSIS — E785 Hyperlipidemia, unspecified: Secondary | ICD-10-CM | POA: Insufficient documentation

## 2010-10-22 DIAGNOSIS — Z5189 Encounter for other specified aftercare: Secondary | ICD-10-CM | POA: Insufficient documentation

## 2010-10-22 DIAGNOSIS — I5181 Takotsubo syndrome: Secondary | ICD-10-CM | POA: Insufficient documentation

## 2010-10-22 DIAGNOSIS — I10 Essential (primary) hypertension: Secondary | ICD-10-CM | POA: Insufficient documentation

## 2010-10-22 DIAGNOSIS — I059 Rheumatic mitral valve disease, unspecified: Secondary | ICD-10-CM | POA: Insufficient documentation

## 2010-10-24 ENCOUNTER — Encounter (HOSPITAL_COMMUNITY): Payer: Medicare Other

## 2010-10-24 ENCOUNTER — Encounter (HOSPITAL_COMMUNITY): Payer: Self-pay

## 2010-10-25 ENCOUNTER — Encounter (HOSPITAL_COMMUNITY): Payer: Self-pay

## 2010-10-25 ENCOUNTER — Encounter (HOSPITAL_COMMUNITY): Payer: Medicare Other

## 2010-10-29 ENCOUNTER — Encounter (HOSPITAL_COMMUNITY): Payer: Self-pay

## 2010-10-29 ENCOUNTER — Encounter (HOSPITAL_COMMUNITY): Payer: Medicare Other

## 2010-10-31 ENCOUNTER — Encounter (HOSPITAL_COMMUNITY): Payer: Medicare Other

## 2010-10-31 ENCOUNTER — Encounter (HOSPITAL_COMMUNITY): Payer: Self-pay

## 2010-11-01 ENCOUNTER — Encounter (HOSPITAL_COMMUNITY): Payer: Medicare Other

## 2010-11-01 ENCOUNTER — Encounter (HOSPITAL_COMMUNITY): Payer: Self-pay

## 2010-11-05 ENCOUNTER — Encounter (HOSPITAL_COMMUNITY): Payer: Self-pay

## 2010-11-05 ENCOUNTER — Encounter (HOSPITAL_COMMUNITY): Payer: Medicare Other

## 2010-11-07 ENCOUNTER — Encounter (HOSPITAL_COMMUNITY): Payer: Self-pay

## 2010-11-07 ENCOUNTER — Encounter (HOSPITAL_COMMUNITY): Payer: Medicare Other

## 2010-11-08 ENCOUNTER — Encounter (HOSPITAL_COMMUNITY): Payer: Self-pay

## 2010-11-08 ENCOUNTER — Encounter (HOSPITAL_COMMUNITY): Payer: Medicare Other

## 2010-11-12 ENCOUNTER — Encounter (HOSPITAL_COMMUNITY): Payer: Self-pay

## 2010-11-12 ENCOUNTER — Encounter (HOSPITAL_COMMUNITY): Payer: Medicare Other

## 2010-11-14 ENCOUNTER — Encounter (HOSPITAL_COMMUNITY): Payer: Self-pay

## 2010-11-14 ENCOUNTER — Encounter (HOSPITAL_COMMUNITY): Payer: Medicare Other

## 2010-11-15 ENCOUNTER — Encounter (HOSPITAL_COMMUNITY): Payer: Self-pay

## 2010-11-15 ENCOUNTER — Encounter (HOSPITAL_COMMUNITY): Payer: Medicare Other

## 2010-11-19 ENCOUNTER — Encounter (HOSPITAL_COMMUNITY): Payer: Self-pay

## 2010-11-19 ENCOUNTER — Other Ambulatory Visit: Payer: Self-pay | Admitting: Internal Medicine

## 2010-11-19 ENCOUNTER — Encounter (HOSPITAL_COMMUNITY): Payer: Self-pay | Attending: Cardiovascular Disease

## 2010-11-19 ENCOUNTER — Encounter (HOSPITAL_COMMUNITY): Payer: Medicare Other

## 2010-11-19 DIAGNOSIS — Z7901 Long term (current) use of anticoagulants: Secondary | ICD-10-CM | POA: Insufficient documentation

## 2010-11-19 DIAGNOSIS — I059 Rheumatic mitral valve disease, unspecified: Secondary | ICD-10-CM | POA: Insufficient documentation

## 2010-11-19 DIAGNOSIS — E785 Hyperlipidemia, unspecified: Secondary | ICD-10-CM | POA: Insufficient documentation

## 2010-11-19 DIAGNOSIS — Z5189 Encounter for other specified aftercare: Secondary | ICD-10-CM | POA: Insufficient documentation

## 2010-11-19 DIAGNOSIS — I10 Essential (primary) hypertension: Secondary | ICD-10-CM | POA: Insufficient documentation

## 2010-11-19 DIAGNOSIS — I471 Supraventricular tachycardia, unspecified: Secondary | ICD-10-CM | POA: Insufficient documentation

## 2010-11-19 DIAGNOSIS — I5181 Takotsubo syndrome: Secondary | ICD-10-CM | POA: Insufficient documentation

## 2010-11-19 DIAGNOSIS — I252 Old myocardial infarction: Secondary | ICD-10-CM | POA: Insufficient documentation

## 2010-11-21 ENCOUNTER — Encounter (HOSPITAL_COMMUNITY): Payer: Self-pay

## 2010-11-21 ENCOUNTER — Encounter (HOSPITAL_COMMUNITY): Payer: Medicare Other

## 2010-11-22 ENCOUNTER — Encounter (HOSPITAL_COMMUNITY): Payer: Medicare Other

## 2010-11-22 ENCOUNTER — Encounter (HOSPITAL_COMMUNITY): Payer: Self-pay

## 2010-11-22 ENCOUNTER — Other Ambulatory Visit: Payer: Self-pay | Admitting: Internal Medicine

## 2010-11-22 NOTE — Telephone Encounter (Signed)
Attempted to contact patient at number provided. No answer and no voicemail available. She needs an office visit as she has not been seen since 2010. She has ulcerative colitis and apparently has GERD which per 08/19/10 note from Dr Juanda Chance she states she has never seen her for. I will attempt to reach patient again at a later time.

## 2010-11-25 MED ORDER — PANTOPRAZOLE SODIUM 40 MG PO TBEC: 40.0000 mg | DELAYED_RELEASE_TABLET | Freq: Every day | ORAL | Status: DC

## 2010-11-25 NOTE — Telephone Encounter (Signed)
Left message on voicemail for patient to call back. 

## 2010-11-25 NOTE — Telephone Encounter (Signed)
Patient has been advised that she needs an appointment with Dr Juanda Chance to discuss GERD and follow up on ulcerative colitis (we have not seen her since 2010). She has scheduled next available appointment for 01/02/11 @ 8:15 am. I have asked her to arrive around 8:05 am for registration. I will send her enough protonix until her office visit on 01/02/11 since Dr Juanda Chance has previously approved it.

## 2010-11-26 ENCOUNTER — Encounter (HOSPITAL_COMMUNITY): Payer: Medicare Other

## 2010-11-26 ENCOUNTER — Encounter (HOSPITAL_COMMUNITY): Payer: Self-pay

## 2010-11-28 ENCOUNTER — Encounter (HOSPITAL_COMMUNITY): Payer: Self-pay

## 2010-11-28 ENCOUNTER — Encounter (HOSPITAL_COMMUNITY): Payer: Medicare Other

## 2010-11-29 ENCOUNTER — Encounter (HOSPITAL_COMMUNITY): Payer: Self-pay

## 2010-11-29 ENCOUNTER — Encounter (HOSPITAL_COMMUNITY): Payer: Medicare Other

## 2010-12-03 ENCOUNTER — Encounter (HOSPITAL_COMMUNITY): Payer: Medicare Other

## 2010-12-03 ENCOUNTER — Encounter (HOSPITAL_COMMUNITY): Payer: Self-pay

## 2010-12-03 NOTE — Procedures (Signed)
LOWER EXTREMITY VENOUS REFLUX EXAM   INDICATION:  Bilateral telangiectasia.   EXAM:  Using color-flow imaging and pulse Doppler spectral analysis, the  right and left common femoral, superficial femoral, popliteal, posterior  tibial, greater and lesser saphenous veins are evaluated.  There is no  evidence suggesting deep venous insufficiency in the right and left  lower extremities.   The right and left saphenofemoral junctions are competent.  The right  and left GSV's are competent.  The right and left proximal short saphenous veins demonstrate  competency.   GSV Diameter (used if found to be incompetent only)                                            Right    Left  Proximal Greater Saphenous Vein           cm       cm  Proximal-to-mid-thigh                     cm       cm  Mid thigh                                 cm       cm  Mid-distal thigh                          cm       cm  Distal thigh                              cm       cm  Knee                                      cm       cm   IMPRESSION:  1. Right and left greater saphenous veins show no evidence of reflux.  2. The right and left greater saphenous veins are not aneurysmal.  3. The right and left greater saphenous veins are not tortuous.  4. The deep venous system is competent.  5. The right and left lesser saphenous veins are competent.   ___________________________________________  Larina Earthly, M.D.   AS/MEDQ  D:  12/01/2008  T:  12/01/2008  Job:  [id number]

## 2010-12-03 NOTE — Assessment & Plan Note (Signed)
OFFICE VISIT   Kirsten Rice, Kirsten Rice  DOB:  01-02-1946                                       12/01/2008  UEAVW#:[id number]   Patient presents today for evaluation of lower extremity venous  pathology.  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  She reports that she has aching  discomfort, generally of her legs, and also specifically over some  telangiectasia over her left knee and right thigh.  She has not had any  history of deep venous thrombosis.  She reports that her mother had  severe varicose veins, and fortunately, she has not had this.   She does have a history of hypertension.  Does have a history of a  myocardial infarction.   SOCIAL HISTORY:  She is married with 1 child.  She is a Engineer, civil (consulting) to retire  soon.  She does not smoke, never has.  She does have 1 glass of wine  daily.   REVIEW OF SYSTEMS:  Weight is reported at 120 pounds.  She is 5 feet 7  inches tall.  She does have some difficulty with restless legs and  headaches, otherwise review of systems is negative.   The medication list is attached with Lotensin, Lasix, potassium  chloride, Protonix, daily aspirin, and vitamin supplements.   PHYSICAL EXAMINATION:  A well-developed and well-nourished white female  appearing stated age of 32.  She has 2+ dorsalis pedis and posterior  tibial pulses bilaterally.  She does not have any swelling, does not  have any varicosities.  She does have diffuse spider vein telangiectasia  bilaterally over her thighs, calves, and knees.  She underwent  noninvasive vascular laboratory study in our office, and this reveals no  evidence of deep venous reflux or DVT.  Her superficial veins show no  evidence of incompetence in her greater saphenous vein bilaterally.   I discussed the significance of this with the patient.  I explained that  she does not appear to have anymore serous venous pathology to cause the  telangiectasia.  I explained that the discomfort over her knee may be  related to the telangiectasia and may simply be associated with other  causes.  She is interested in pursuing sclerotherapy for potential  symptom relief and also for potential cosmetic improvement.  She  understands this is an out-of-pocket expense, and we discussed our  prediction of cost to her.  She wishes to proceed with the  sclerotherapy.  I explained that Clementeen Hoof, RN, currently is doing  sclerotherapy in our office, and we will schedule this at our  convenience.   Larina Earthly, M.D.  Electronically Signed   TFE/MEDQ  D:  12/01/2008  T:  12/04/2008  Job:  [id number]   cc:   Tinnie Gens A. Tawanna Cooler, MD

## 2010-12-03 NOTE — Assessment & Plan Note (Signed)
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  Last colonoscopy in August, 2006  was incomplete due to looping of the colonoscope proximal to the cecal  pouch.  Exam showed some ulcerative colitis, mostly chronic changes with  mild right-sided activity and fibrosis.  Patient had a heart attack in  2007 and has undergone no major changes in her lifestyle and diet.  This  positively impacted her colitis in that she is completely asymptomatic.  She needs refill for her Colazal to take.  She takes 750 mg 3 tablets  twice daily.  She uses [facility] on [address].   PHYSICAL EXAMINATION:  Blood pressure 132/78, pulse 64, weight 126  pounds, which represents a 5 pound weight gain.  She was alert and oriented in no distress.  LUNGS:  Clear to auscultation.  COR:  Normal S1 and S2.  ABDOMEN:  Soft and scaphoid with normoactive bowel sounds.  No  tenderness.  No palpable mass.  Liver edge at costal margin.  RECTAL:  Soft, hemoccult negative stool.   IMPRESSION:  A 65 year old white female with inflammatory bowel disease  in remission.  Due for next colonoscopy in approximately five years,  which would be August, 2001.   PLAN:  1. Refill Colazal.  Continue the same dose.  2. Hemoglobin A1C.  Patient is concerned about her recent hypoglycemia      and family history of type 2 diabetes in two of her sisters.     Kirsten Rice. Juanda Chance, MD  Electronically Signed    DMB/MedQ  DD: 10/12/2007  DT: 10/12/2007  Job #: [id number]   cc:   Tinnie Gens A. Tawanna Cooler, MD

## 2010-12-05 ENCOUNTER — Encounter (HOSPITAL_COMMUNITY): Payer: Medicare Other

## 2010-12-05 ENCOUNTER — Encounter (HOSPITAL_COMMUNITY): Payer: Self-pay

## 2010-12-06 ENCOUNTER — Encounter (HOSPITAL_COMMUNITY): Payer: Medicare Other

## 2010-12-06 ENCOUNTER — Encounter (HOSPITAL_COMMUNITY): Payer: Self-pay

## 2010-12-06 NOTE — Cardiovascular Report (Signed)
Kirsten, Rice NO.:  [id number]   MEDICAL RECORD NO.:  [id number]          PATIENT TYPE:  INP   LOCATION:  2914                         FACILITY:  [facility]   PHYSICIAN:  Richard A. Alanda Amass, M.D.DATE OF BIRTH:  07-04-1946   DATE OF PROCEDURE:  11/18/2005  DATE OF DISCHARGE:                              CARDIAC CATHETERIZATION   PROCEDURE:  Retrograde central aortic catheterization, selective coronary  angiography via Judkins technique, left ventricular angiogram RAO/LAO  projection, hand injection aortic root, hand injection abdominal aortic  angiogram PA projection.   BRIEF HISTORY:  Kirsten Rice is a very pleasant 65 year old white divorced and  remarried mother of one daughter with no grandchildren.  She is a nonsmoker  and is a very pleasant 65 year old white divorced and remarried mother of  one daughter with no grandchildren.  She is a nonsmoker.  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  She has a stressful job, and she  was at a relatively stressful meeting tonight.  There had been no recent  deaths in the family or difficulties at home or other major stressful  issues.  She has a history of mitral valve prolapse, Crohn's disease which  is stable and mild hypertension.  She is post menopausal on daily hormonal  supplement, takes two baby aspirin a day, lisinopril and one baby aspirin.  She has not required steroid therapy for her Crohn's disease in many years.  She states p.r.n. hydrochlorothiazide for lower extremity edema.   Kirsten Rice presented with ischemic-sounding chest pain, negative enzymes and no  acute EKG changes in July 2006 and underwent catheterization by Dr. Elsie Lincoln.  At that time, she had an entirely normal left ventricle with MVP without MR  and entirely normal smooth tortuous coronary arteries.  The etiology of her  chest pain was not clear at that time.  She also has a history of Raynaud's  phenomenon, but there was no  history of SLE or any bleeding diathesis or DVT  or past thrombotic events.  While at a meeting this evening with nurses and  physicians, she developed progressive substernal chest pain that was  pressure-like, radiated to her neck and jaw, was associated with shortness  of breath.  There was no significant reflux component.  She came to the  emergency room and was seen by Dr. Domingo Sep, and EKG showed nonspecific ST-T  wave changes, slightly more marked than prior, but she did have some mild ST  elevation in I and aVL.  Her potassium was 3.1. Otherwise, laboratory was  unremarkable except for elevated troponin with troponin 3.0 showing a rise  on second and MB rising to 13.4.  She had continued chest pain in the  emergency room despite IV morphine and sublingual nitroglycerin, but it was  improved.  She was attended to carefully by Dr. Domingo Sep. She was started on  heparin and given four baby aspirin.  It was felt best in this situation to  undergo coronary angiography and LV angiography for further assessment  despite her normal coronary arteries in the past.  This was predominantly in  view of continued chest pain and elevated enzymes.  I saw her with Dr.  Domingo Sep in the emergency room.  She did not have typical EKG to suggest  tako-tsubo syndrome (often presents with acute ST-segment elevation), but  there was concern about this in the historical setting as outlined above.  Informed consent was obtained from the patient and the husband to proceed  with angiography.  She was brought second floor CP lab.  On entering the  laboratory, she had a episode of hypotension with blood pressure dropping to  65 and 70.  We stopped her IV nitroglycerin, her heparin was held and  careful fluids were administered in bolus fashion. Her blood pressure came  up to 80 - 85.  The CRFA was entered with single anterior puncture using 18  thin-wall needle, and a 6-French short Daig sidearm sheath was  inserted  without difficulty.  Coronary angiography was done with 6-French 4-cm taper,  preformed Cordis coronary and angled pigtail catheters.  LV angiogram was  done in the RAO and LAO projection at 25 cc, 14 cc per second; 20 cc, 12 cc  per second, respectively.  Pullback pressure CA was performed and showed no  gradient across the aortic valve.  At this point, blood pressure had risen  to 100 - 105. Good color returned.  She was pain free.  The ACT was greater  than 300. Repeat potassium was 3.2.   PRESSURES:  LV:  105/18; LVEDP 26 mmHg.   CA:  105/65 mmHg.   There is no gradient across the aortic valve on catheter pullback.   Hand injection of the aortic arch did not show any evidence of dissection or  aortic regurgitation on limited injection.   Hand injection of the abdominal aorta above the level of the renal artery  showed normal single renal arteries, good bilateral nephrograms and no  aneurysm or aortic stenosis with normal appearing iliacs, patent IMA, SMA  and celiac proximally.   The main left coronary was entirely normal and relatively long.   The left anterior descending was torturous, widely patent and smooth  throughout its course where it coursed to the apex and bifurcated.  There  was a normal large DX1 from the proximal third of the LAD that bifurcated  followed by a septal perforator.  There was a small DX2 from the mid-LAD  that was normal.   The circumflex artery was a moderate size nondominant vessel with a small  OM1 and a large trifurcating OM2.  The AV groove and posterior AV groove  branches were normal, widely patent and smooth   The right coronary was a dominant vessel, widely patent and smooth with  normal PDA, normal bifurcating PLA and RV branches.  There was no evidence  of coronary spasm or atherosclerotic epicardial coronary disease on  angiography.  There was no evidence of coronary spasm and no evidence of thrombus.   LV angiogram  demonstrated hypokinesis of the basilar, down to the apical  portion of the anterolateral wall, hypo-akinesis of the mid inferior wall  and hypo-akinesis from the mid septum to the posterior apical segment in the  LAO projection.  Estimated EF was approximately 20 to 25%.  There was mitral  valve prolapse present with no mitral regurgitation.   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  She did not have ST-segment  elevation which is often present with in this  syndrome.  She did have a  precipitating event of a particularly stressful meeting tonight.  She has a  underlying history of Raynaud's that may indicate some microvascular  problems.  This syndrome fortunately has a good prognosis and often there is  near full or full LV recovery.  It is probably not felt to be related to  coronary spasm since this is not usually observed in this setting and may be  microvascular in origin or possibly neurohormonal related as well.  It is  common in people of this age group.  I would recommend treatment of her LV  dysfunction which would probably include ACE inhibitors to tolerance  assuming her creatinine is okay post angiogram, along with diuretics if  necessary carefully administered, consideration for aldosterone inhibitors  and possible need for beta blockers.  Long-term therapy might include  vasodilators, ACE inhibitors and possibly status to help with endothelial  function.  I would recommend she be started on heparin after her sheath was  removed and then switched over to Coumadin because of the risk of  embolization and LV thrombus.   Case was discussed with Dr. Domingo Sep post catheterization along with the  patient and her husband.   CATHETERIZATION DIAGNOSIS:  1.  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  2.   Normal epicardial coronary arteries.  3.  Acute LV dysfunction, anteroapical and mid inferior hypo-akinesis, EF      less than 25%.  4.  History of hypertension, normal renal arteries.  5.  Chronic hormonal replacement which may need to be considered for      discontinuation, cholesterol status unknown, a nonsmoker, hypokalemia      possibly related to intermittent outpatient diuretic therapy to be      replaced, Crohn's disease inactive, Raynaud's syndrome by history.      Richard A. Alanda Amass, M.D.  Electronically Signed     RAW/MEDQ  D:  11/18/2005  T:  11/19/2005  Job:  [id number]   cc:   Madaline Savage, M.D.  Fax: [phone]   Eugenio Hoes. Tawanna Cooler, M.D. [facility]  [address] [place]  [place] [address]

## 2010-12-06 NOTE — Discharge Summary (Signed)
NAMESHALINA, NORFOLK NO.:  [id number]   MEDICAL RECORD NO.:  [id number]          PATIENT TYPE:  INP   LOCATION:  2039                         FACILITY:  [facility]   PHYSICIAN:  Madaline Savage, M.D.DATE OF BIRTH:  April 14, 1946   DATE OF ADMISSION:  11/18/2005  DATE OF DISCHARGE:  11/25/2005                                 DISCHARGE SUMMARY   DISCHARGE DIAGNOSES:  1.  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  2.  Congestive heart failure secondary to #1, improved at discharge.  3.  Paroxysmal supraventricular tachycardia.  4.  Past history of Crohn's colitis.  5.  History of Raynaud's.  6.  Coumadin therapy for anterior wall hypokinesis.   HOSPITAL COURSE:  Ms. 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  She has a past history of prolapse,  hypertension, Crohn's, colitis and Raynaud's.  She has had previous chest  pain.  She had good LV function by an echocardiogram in 2002 and negative  Cardiolite 2002.  She had a cath in July 2006 and this showed no significant  CAD with good LV function.  She presented to the emergency room Nov 18, 2005  with 6/10 chest pain consistent with unstable angina.  Her EKG showed ST  changes.  She was admitted and her subsequent cardiac markers were positive.  Her troponin was 3.02.  She was started on aspirin, beta-blockers, nitrates,  and a statin.  It was decided to proceed with diagnostic catheterization  which was done Nov 18, 2005 by Dr. Alanda Amass.  She had normal coronaries with  a severe LV dysfunction with an EF of 20-25%.  Dr. Alanda Amass feels she has  typical Tako-Tsubo syndrome.  She did develop some congestive failure which  responded to diuretics.  She was watched in the CCU.  We did have to put her  on dopamine for hypotension and this  was tapered off within 24 hours.  Echocardiogram on Nov 19, 2005 revealed an EF of 40%.  She still had some  residual anterior wall and anterior septal hypokinesis.  She was started on  Coumadin therapy.  Over the weekend she again had recurrent congestive  failure and was treated with diuretics.  She was transferred to floor on Nov 23, 2005 and we feel she can be discharged Nov 25, 2005.  Plan is to leave her  on Coumadin for at least 6 weeks or so.   DISCHARGE MEDICATIONS:  1.  Aspirin 81 mg once a day.  2.  Coumadin, she will take 2 mg a day with 4 mg on Monday and Thursday.  3.  Colazal 750 mg two tablets twice a day.  4.  Prilosec OTC once a day.  5.  Aldactone 12.5 mg a day.  6.  Simvastatin 20 mg a day.  7.  Lotensin 10 mg a day.  8.  Coreg  CR 10 mg a day.  9.  Lanoxin 0.25 mg a day.  10. Lasix 20 mg a day.  11. Potassium 20 mEq a day.   LABORATORIES AT DISCHARGE:  Her INR is 2.2.  Hematology shows a white count  of 6.6, hemoglobin 11.7, hematocrit 33.7, platelets 245.  Sodium 140,  potassium 4.1, BUN 10, creatinine 1.0.  CKs peaked at 208 with 30 MBs.  Lipid panel shows an LDL of 84, HDL 84.  BNP on Nov 24, 2005 was 1443.  ANA  was negative.  D-dimer is 0.91.  TSH 6.20.  Chest x-ray on Nov 24, 2005  showed no significant change in bibasilar atelectasis and improved right  effusion.  EKG shows sinus rhythm with T-wave inversion in lead I, lead II,  aVF, aVL and V1-V6.   DISPOSITION:  The patient is discharged in stable condition and will follow  up with Dr. Elsie Lincoln.  She will have a prothrombin time Thursday of this week.      Abelino Derrick, P.A.    ______________________________  Madaline Savage, M.D.    Lenard Lance  D:  11/25/2005  T:  11/26/2005  Job:  [id number]

## 2010-12-06 NOTE — Op Note (Signed)
NAME:  Kirsten Rice, Kirsten Rice                           ACCOUNT NO.:  [id number]   MEDICAL RECORD NO.:  [id number]                   PATIENT TYPE:  AMB   LOCATION:  ENDO                                 FACILITY:  [facility]   PHYSICIAN:  [name], M.D. [facility]               DATE OF BIRTH:  26-Sep-1945   DATE OF PROCEDURE:  03/24/2003  DATE OF DISCHARGE:                                 OPERATIVE REPORT   PROCEDURE:  Colonoscopy.   INDICATIONS:  This 65 year old white female has a history of inflammatory  bowel disease, most likely Crohn's colitis.  She has been intolerant to  multiple medications and her medical regimen has included only sulfasalazine  in small amounts and intermittent steroids for acute flare ups.  She also  had two Remicade infusions in the past.  Her last colonoscopy was done in  1998, showing some activity in the right colon.  She has been pain-free, but  continues to have chronic diarrhea, having loose stools but no nocturnal  bowel movements.  Her weight has been maintained at around 115 pounds.  She  is now undergoing repeat colonoscopy to assess the activity of the disease  and to modify her medial regimen.   ENDOSCOPE:  Fujinon single channel video endoscope.   SEDATION:  Versed 7 mg IV, fentanyl 70 mcg IV.   FINDINGS:  The Fujinon single channel video endoscope was directly into the  rectum to the sigmoid colon.  The patient was monitored by pulse oximetry  and oxygen saturations were normal.  Her prep was excellent.  There was no  perianal disease and rectal tone was normal.  In the sigmoid and descending  colon, mucosa was fibrotic with decreased ulcerations.  There was diffuse  fibrosis of the left colon with patchy areas erythema and edema with  complete loss of submucosal blood vessels.  There were no discrete  ulcerations and no pseudopolyps.  The colonoscope passed easily through the  descending colon to the transverse colon which appear essentially normal.  In  the right colon to the hepatic flexure and in the ascending colon, mucosa  was again rather fibrotic and patchy with patches of erythema.  In the cecal  pouch, at least one tiny aphthous ulceration was noted.  The ileocecal valve  itself was normal.  Multiple biopsies were taken from the cecal pouch.  An  attempt was made to intubate the terminal ileum but I was unable to do that.  Two specimen containers were obtained, one from the right colon with random  biopsies and one from the left colon.  The patient tolerated the procedure  well.   IMPRESSION:  Chronic as well as active Crohn's colitis, status post random  biopsies.   PLAN:  1. I will discuss with the patient modification of her regimen.  Again, we     will bring up __________ p.r.n. as an immunomodulator.  2. Try substitute Azulfidine with Fluconazole.  In the past, was unable to     tolerate Isocal.  3. Consider restarting her Remicade infusions.   All of this will be discussed with the patient.                                               [name], M.D. [facility]   DB/MEDQ  D:  03/24/2003  T:  03/25/2003  Job:  [id number]

## 2010-12-10 ENCOUNTER — Encounter (HOSPITAL_COMMUNITY): Payer: Self-pay

## 2010-12-10 ENCOUNTER — Encounter (HOSPITAL_COMMUNITY): Payer: Medicare Other

## 2010-12-12 ENCOUNTER — Encounter (HOSPITAL_COMMUNITY): Payer: Medicare Other

## 2010-12-12 ENCOUNTER — Encounter (HOSPITAL_COMMUNITY): Payer: Self-pay

## 2010-12-13 ENCOUNTER — Encounter (HOSPITAL_COMMUNITY): Payer: Self-pay

## 2010-12-13 ENCOUNTER — Encounter (HOSPITAL_COMMUNITY): Payer: Medicare Other

## 2010-12-17 ENCOUNTER — Encounter (HOSPITAL_COMMUNITY): Payer: Self-pay

## 2010-12-17 ENCOUNTER — Encounter (HOSPITAL_COMMUNITY): Payer: Medicare Other

## 2010-12-19 ENCOUNTER — Encounter (HOSPITAL_COMMUNITY): Payer: Self-pay

## 2010-12-19 ENCOUNTER — Encounter (HOSPITAL_COMMUNITY): Payer: Medicare Other

## 2010-12-20 ENCOUNTER — Encounter (HOSPITAL_COMMUNITY): Payer: Medicare Other

## 2010-12-20 ENCOUNTER — Encounter (HOSPITAL_COMMUNITY): Payer: Self-pay | Attending: Cardiovascular Disease

## 2010-12-20 DIAGNOSIS — Z5189 Encounter for other specified aftercare: Secondary | ICD-10-CM | POA: Insufficient documentation

## 2010-12-20 DIAGNOSIS — E785 Hyperlipidemia, unspecified: Secondary | ICD-10-CM | POA: Insufficient documentation

## 2010-12-20 DIAGNOSIS — I059 Rheumatic mitral valve disease, unspecified: Secondary | ICD-10-CM | POA: Insufficient documentation

## 2010-12-20 DIAGNOSIS — I471 Supraventricular tachycardia, unspecified: Secondary | ICD-10-CM | POA: Insufficient documentation

## 2010-12-20 DIAGNOSIS — Z7901 Long term (current) use of anticoagulants: Secondary | ICD-10-CM | POA: Insufficient documentation

## 2010-12-20 DIAGNOSIS — I252 Old myocardial infarction: Secondary | ICD-10-CM | POA: Insufficient documentation

## 2010-12-20 DIAGNOSIS — I10 Essential (primary) hypertension: Secondary | ICD-10-CM | POA: Insufficient documentation

## 2010-12-20 DIAGNOSIS — I5181 Takotsubo syndrome: Secondary | ICD-10-CM | POA: Insufficient documentation

## 2010-12-24 ENCOUNTER — Encounter (HOSPITAL_COMMUNITY): Payer: Self-pay

## 2010-12-24 ENCOUNTER — Encounter (HOSPITAL_COMMUNITY): Payer: Medicare Other

## 2010-12-25 ENCOUNTER — Encounter: Payer: Self-pay | Admitting: Internal Medicine

## 2010-12-26 ENCOUNTER — Encounter (HOSPITAL_COMMUNITY): Payer: Self-pay

## 2010-12-26 ENCOUNTER — Encounter (HOSPITAL_COMMUNITY): Payer: Medicare Other

## 2010-12-27 ENCOUNTER — Encounter (HOSPITAL_COMMUNITY): Payer: Self-pay

## 2010-12-27 ENCOUNTER — Encounter (HOSPITAL_COMMUNITY): Payer: Medicare Other

## 2010-12-31 ENCOUNTER — Encounter (HOSPITAL_COMMUNITY): Payer: Self-pay

## 2010-12-31 ENCOUNTER — Encounter (HOSPITAL_COMMUNITY): Payer: Medicare Other

## 2011-01-02 ENCOUNTER — Ambulatory Visit (INDEPENDENT_AMBULATORY_CARE_PROVIDER_SITE_OTHER): Payer: Medicare Other | Admitting: Internal Medicine

## 2011-01-02 ENCOUNTER — Encounter (HOSPITAL_COMMUNITY): Payer: Self-pay

## 2011-01-02 ENCOUNTER — Encounter (HOSPITAL_COMMUNITY): Payer: Medicare Other

## 2011-01-02 ENCOUNTER — Encounter: Payer: Self-pay | Admitting: Internal Medicine

## 2011-01-02 VITALS — BP 124/76 | HR 62 | Ht 67.0 in | Wt 121.0 lb

## 2011-01-02 DIAGNOSIS — K5100 Ulcerative (chronic) pancolitis without complications: Secondary | ICD-10-CM

## 2011-01-02 MED ORDER — BALSALAZIDE DISODIUM 750 MG PO CAPS: ORAL_CAPSULE | ORAL | Status: DC

## 2011-01-02 MED ORDER — PANTOPRAZOLE SODIUM 40 MG PO TBEC: 40.0000 mg | DELAYED_RELEASE_TABLET | Freq: Every day | ORAL | Status: DC

## 2011-01-02 NOTE — Patient Instructions (Addendum)
We have sent a refill to your pharmacy for Protonix. You should take 1 tablet by mouth once daily. We have sent refills for Colazal 750 mg. You should take 1 tablet by mouth three times daily. You will be due for a recall colonoscopy in June 2013. We will send you a reminder when it gets closer to that time.  Dr Wylene Simmer

## 2011-01-02 NOTE — Progress Notes (Signed)
Kirsten Rice 08-16-1945 MRN [id number]      History of Present Illness:  This is a 65 year old white female with chronic ulcerative colitis for more than 20 years duration. She is in symptomatic remission. Her last office visit was in March 2009. Her last colonoscopy was in June 2010, Biopsies showed patchy chronic active colitis. She denies rectal bleeding or abdominal pain. She retired 2 years ago and has done well. She has a history of coronary artery disease and is status post MI in 2007. Patient is on Colazal 750 mg, 3 tablets a day    Past Medical History  Diagnosis Date  . Takotsubo cardiomyopathy 2007  . Raynaud's disease   . Mitral valve prolapse   . Crohn disease   . Deep vein thrombosis   . Telangiectasia   . Hyperthyroidism   . GERD (gastroesophageal reflux disease)   . Cardiomyopathy    Past Surgical History  Procedure Date  . Shoulder surgery 1996    right  . Tubal ligation 1973  . Appendectomy 1971    reports that she has never smoked. She has never used smokeless tobacco. She reports that she drinks alcohol. She reports that she does not use illicit drugs. family history includes Aortic stenosis in her father.  There is no history of Colon cancer. Allergies  Allergen Reactions  . Prochlorperazine Edisylate     REACTION: extrapyrimadal syndrome  . Tetanus-Diphtheria Toxoids     REACTION: arm swells        Review of Systems:Occasional reflux symptoms. No dysphagia or odynophagia. Denies shortness of breath or chest pain  The remainder of the 10  point ROS is negative except as outlined in H&P   Physical Exam: General appearance  Well developed, in no distress. Eyes- non icteric. HEENT nontraumatic, normocephalic. Mouth no lesions, tongue papillated, no cheilosis. Neck supple without adenopathy, thyroid not enlarged, no carotid bruits, no JVD. Lungs Clear to auscultation bilaterally. Cor normal S1 normal S2, regular rhythm , no murmur,  quiet  precordium. Abdomen Soft nontender abdomen with normal active bowel sounds. No tenderness. Liver edge at costal margin. No CVA tenderness.  Rectal:Soft Hemoccult negative stool. Extremities no pedal edema. Skin no lesions. Neurological alert and oriented x 3. Psychological normal mood and affect.  Assessment and Plan:  Problem #1 Asymptomatic ulcerative colitis. Patient is up-to-date on her colonoscopy. A recall colonoscopy will be due in June 2013. We will plan to have her continue Colazal 750 mg 3 times a day. She will take Protonix on an as necessary basis only. She will be due for an office visit in one year.   01/02/2011 [name]

## 2011-01-03 ENCOUNTER — Encounter (HOSPITAL_COMMUNITY): Payer: Medicare Other

## 2011-01-03 ENCOUNTER — Encounter (HOSPITAL_COMMUNITY): Payer: Self-pay

## 2011-01-07 ENCOUNTER — Encounter (HOSPITAL_COMMUNITY): Payer: Medicare Other

## 2011-01-07 ENCOUNTER — Encounter (HOSPITAL_COMMUNITY): Payer: Self-pay

## 2011-01-09 ENCOUNTER — Encounter (HOSPITAL_COMMUNITY): Payer: Medicare Other

## 2011-01-09 ENCOUNTER — Encounter (HOSPITAL_COMMUNITY): Payer: Self-pay

## 2011-01-10 ENCOUNTER — Encounter (HOSPITAL_COMMUNITY): Payer: Medicare Other

## 2011-01-10 ENCOUNTER — Encounter (HOSPITAL_COMMUNITY): Payer: Self-pay

## 2011-01-14 ENCOUNTER — Encounter (HOSPITAL_COMMUNITY): Payer: Medicare Other

## 2011-01-14 ENCOUNTER — Encounter (HOSPITAL_COMMUNITY): Payer: Self-pay

## 2011-01-16 ENCOUNTER — Encounter (HOSPITAL_COMMUNITY): Payer: Self-pay

## 2011-01-16 ENCOUNTER — Encounter (HOSPITAL_COMMUNITY): Payer: Medicare Other

## 2011-01-17 ENCOUNTER — Encounter (HOSPITAL_COMMUNITY): Payer: Medicare Other

## 2011-01-17 ENCOUNTER — Encounter (HOSPITAL_COMMUNITY): Payer: Self-pay

## 2011-01-21 ENCOUNTER — Encounter (HOSPITAL_COMMUNITY): Payer: Self-pay | Attending: Cardiovascular Disease

## 2011-01-21 ENCOUNTER — Encounter (HOSPITAL_COMMUNITY): Payer: Self-pay

## 2011-01-21 ENCOUNTER — Encounter (HOSPITAL_COMMUNITY): Payer: Medicare Other

## 2011-01-21 DIAGNOSIS — Z7901 Long term (current) use of anticoagulants: Secondary | ICD-10-CM | POA: Insufficient documentation

## 2011-01-21 DIAGNOSIS — I252 Old myocardial infarction: Secondary | ICD-10-CM | POA: Insufficient documentation

## 2011-01-21 DIAGNOSIS — I471 Supraventricular tachycardia, unspecified: Secondary | ICD-10-CM | POA: Insufficient documentation

## 2011-01-21 DIAGNOSIS — I5181 Takotsubo syndrome: Secondary | ICD-10-CM | POA: Insufficient documentation

## 2011-01-21 DIAGNOSIS — E785 Hyperlipidemia, unspecified: Secondary | ICD-10-CM | POA: Insufficient documentation

## 2011-01-21 DIAGNOSIS — I059 Rheumatic mitral valve disease, unspecified: Secondary | ICD-10-CM | POA: Insufficient documentation

## 2011-01-21 DIAGNOSIS — I10 Essential (primary) hypertension: Secondary | ICD-10-CM | POA: Insufficient documentation

## 2011-01-21 DIAGNOSIS — Z5189 Encounter for other specified aftercare: Secondary | ICD-10-CM | POA: Insufficient documentation

## 2011-01-23 ENCOUNTER — Encounter (HOSPITAL_COMMUNITY): Payer: Medicare Other

## 2011-01-23 ENCOUNTER — Encounter (HOSPITAL_COMMUNITY): Payer: Self-pay

## 2011-01-24 ENCOUNTER — Encounter (HOSPITAL_COMMUNITY): Payer: Self-pay

## 2011-01-24 ENCOUNTER — Encounter (HOSPITAL_COMMUNITY): Payer: Medicare Other

## 2011-01-28 ENCOUNTER — Encounter (HOSPITAL_COMMUNITY): Payer: Self-pay

## 2011-01-28 ENCOUNTER — Encounter (HOSPITAL_COMMUNITY): Payer: Medicare Other

## 2011-01-30 ENCOUNTER — Encounter (HOSPITAL_COMMUNITY): Payer: Self-pay

## 2011-01-30 ENCOUNTER — Encounter (HOSPITAL_COMMUNITY): Payer: Medicare Other

## 2011-01-31 ENCOUNTER — Encounter (HOSPITAL_COMMUNITY): Payer: Medicare Other

## 2011-01-31 ENCOUNTER — Encounter (HOSPITAL_COMMUNITY): Payer: Self-pay

## 2011-02-04 ENCOUNTER — Encounter (HOSPITAL_COMMUNITY): Payer: Self-pay

## 2011-02-04 ENCOUNTER — Encounter (HOSPITAL_COMMUNITY): Payer: Medicare Other

## 2011-02-06 ENCOUNTER — Encounter (HOSPITAL_COMMUNITY): Payer: Self-pay

## 2011-02-06 ENCOUNTER — Encounter (HOSPITAL_COMMUNITY): Payer: Medicare Other

## 2011-02-07 ENCOUNTER — Encounter (HOSPITAL_COMMUNITY): Payer: Medicare Other

## 2011-02-07 ENCOUNTER — Encounter (HOSPITAL_COMMUNITY): Payer: Self-pay

## 2011-02-11 ENCOUNTER — Encounter (HOSPITAL_COMMUNITY): Payer: Self-pay

## 2011-02-11 ENCOUNTER — Encounter (HOSPITAL_COMMUNITY): Payer: Medicare Other

## 2011-02-13 ENCOUNTER — Encounter (HOSPITAL_COMMUNITY): Payer: Medicare Other

## 2011-02-13 ENCOUNTER — Encounter (HOSPITAL_COMMUNITY): Payer: Self-pay

## 2011-02-14 ENCOUNTER — Encounter (HOSPITAL_COMMUNITY): Payer: Medicare Other

## 2011-02-14 ENCOUNTER — Encounter (HOSPITAL_COMMUNITY): Payer: Self-pay

## 2011-02-18 ENCOUNTER — Encounter (HOSPITAL_COMMUNITY): Payer: Self-pay

## 2011-02-18 ENCOUNTER — Encounter (HOSPITAL_COMMUNITY): Payer: Medicare Other

## 2011-02-20 ENCOUNTER — Encounter (HOSPITAL_COMMUNITY): Payer: Medicare Other

## 2011-02-20 ENCOUNTER — Encounter (HOSPITAL_COMMUNITY): Payer: Self-pay

## 2011-02-20 ENCOUNTER — Encounter (HOSPITAL_COMMUNITY): Payer: Self-pay | Attending: Cardiovascular Disease

## 2011-02-20 DIAGNOSIS — Z5189 Encounter for other specified aftercare: Secondary | ICD-10-CM | POA: Insufficient documentation

## 2011-02-20 DIAGNOSIS — Z7901 Long term (current) use of anticoagulants: Secondary | ICD-10-CM | POA: Insufficient documentation

## 2011-02-20 DIAGNOSIS — I5181 Takotsubo syndrome: Secondary | ICD-10-CM | POA: Insufficient documentation

## 2011-02-20 DIAGNOSIS — I471 Supraventricular tachycardia, unspecified: Secondary | ICD-10-CM | POA: Insufficient documentation

## 2011-02-20 DIAGNOSIS — I059 Rheumatic mitral valve disease, unspecified: Secondary | ICD-10-CM | POA: Insufficient documentation

## 2011-02-20 DIAGNOSIS — I252 Old myocardial infarction: Secondary | ICD-10-CM | POA: Insufficient documentation

## 2011-02-20 DIAGNOSIS — E785 Hyperlipidemia, unspecified: Secondary | ICD-10-CM | POA: Insufficient documentation

## 2011-02-20 DIAGNOSIS — I10 Essential (primary) hypertension: Secondary | ICD-10-CM | POA: Insufficient documentation

## 2011-02-21 ENCOUNTER — Encounter (HOSPITAL_COMMUNITY): Payer: Self-pay

## 2011-02-21 ENCOUNTER — Encounter (HOSPITAL_COMMUNITY): Payer: Medicare Other

## 2011-02-25 ENCOUNTER — Encounter (HOSPITAL_COMMUNITY): Payer: Self-pay

## 2011-02-25 ENCOUNTER — Encounter (HOSPITAL_COMMUNITY): Payer: Medicare Other

## 2011-02-27 ENCOUNTER — Encounter (HOSPITAL_COMMUNITY): Payer: Self-pay

## 2011-02-27 ENCOUNTER — Encounter (HOSPITAL_COMMUNITY): Payer: Medicare Other

## 2011-02-28 ENCOUNTER — Encounter (HOSPITAL_COMMUNITY): Payer: Self-pay

## 2011-02-28 ENCOUNTER — Encounter (HOSPITAL_COMMUNITY): Payer: Medicare Other

## 2011-03-04 ENCOUNTER — Encounter (HOSPITAL_COMMUNITY): Payer: Self-pay

## 2011-03-04 ENCOUNTER — Encounter (HOSPITAL_COMMUNITY): Payer: Medicare Other

## 2011-03-06 ENCOUNTER — Encounter (HOSPITAL_COMMUNITY): Payer: Medicare Other

## 2011-03-06 ENCOUNTER — Encounter (HOSPITAL_COMMUNITY): Payer: Self-pay

## 2011-03-07 ENCOUNTER — Encounter (HOSPITAL_COMMUNITY): Payer: Self-pay

## 2011-03-07 ENCOUNTER — Encounter (HOSPITAL_COMMUNITY): Payer: Medicare Other

## 2011-03-11 ENCOUNTER — Encounter (HOSPITAL_COMMUNITY): Payer: Self-pay

## 2011-03-11 ENCOUNTER — Encounter (HOSPITAL_COMMUNITY): Payer: Medicare Other

## 2011-03-12 ENCOUNTER — Encounter (HOSPITAL_COMMUNITY): Payer: Self-pay

## 2011-03-13 ENCOUNTER — Encounter (HOSPITAL_COMMUNITY): Payer: Self-pay

## 2011-03-13 ENCOUNTER — Encounter (HOSPITAL_COMMUNITY): Payer: Medicare Other

## 2011-03-14 ENCOUNTER — Encounter (HOSPITAL_COMMUNITY): Payer: Medicare Other

## 2011-03-14 ENCOUNTER — Encounter (HOSPITAL_COMMUNITY): Payer: Self-pay

## 2011-03-18 ENCOUNTER — Encounter (HOSPITAL_COMMUNITY): Payer: Self-pay

## 2011-03-18 ENCOUNTER — Encounter (HOSPITAL_COMMUNITY): Payer: Medicare Other

## 2011-03-20 ENCOUNTER — Encounter (HOSPITAL_COMMUNITY): Payer: Self-pay

## 2011-03-20 ENCOUNTER — Encounter (HOSPITAL_COMMUNITY): Payer: Medicare Other

## 2011-03-21 ENCOUNTER — Encounter (HOSPITAL_COMMUNITY): Payer: Self-pay

## 2011-03-21 ENCOUNTER — Encounter (HOSPITAL_COMMUNITY): Payer: Medicare Other

## 2011-03-25 ENCOUNTER — Encounter (HOSPITAL_COMMUNITY): Payer: Self-pay

## 2011-03-25 ENCOUNTER — Encounter (HOSPITAL_COMMUNITY): Payer: Medicare Other

## 2011-03-25 ENCOUNTER — Encounter (HOSPITAL_COMMUNITY): Payer: Self-pay | Attending: Cardiovascular Disease

## 2011-03-25 DIAGNOSIS — I471 Supraventricular tachycardia, unspecified: Secondary | ICD-10-CM | POA: Insufficient documentation

## 2011-03-25 DIAGNOSIS — I252 Old myocardial infarction: Secondary | ICD-10-CM | POA: Insufficient documentation

## 2011-03-25 DIAGNOSIS — Z7901 Long term (current) use of anticoagulants: Secondary | ICD-10-CM | POA: Insufficient documentation

## 2011-03-25 DIAGNOSIS — I10 Essential (primary) hypertension: Secondary | ICD-10-CM | POA: Insufficient documentation

## 2011-03-25 DIAGNOSIS — I059 Rheumatic mitral valve disease, unspecified: Secondary | ICD-10-CM | POA: Insufficient documentation

## 2011-03-25 DIAGNOSIS — Z5189 Encounter for other specified aftercare: Secondary | ICD-10-CM | POA: Insufficient documentation

## 2011-03-25 DIAGNOSIS — I5181 Takotsubo syndrome: Secondary | ICD-10-CM | POA: Insufficient documentation

## 2011-03-25 DIAGNOSIS — E785 Hyperlipidemia, unspecified: Secondary | ICD-10-CM | POA: Insufficient documentation

## 2011-03-27 ENCOUNTER — Encounter (HOSPITAL_COMMUNITY): Payer: Self-pay

## 2011-03-27 ENCOUNTER — Encounter (HOSPITAL_COMMUNITY): Payer: Medicare Other

## 2011-03-28 ENCOUNTER — Encounter (HOSPITAL_COMMUNITY): Payer: Medicare Other

## 2011-03-28 ENCOUNTER — Encounter (HOSPITAL_COMMUNITY): Payer: Self-pay

## 2011-04-01 ENCOUNTER — Encounter (HOSPITAL_COMMUNITY): Payer: Self-pay

## 2011-04-01 ENCOUNTER — Encounter (HOSPITAL_COMMUNITY): Payer: Medicare Other

## 2011-04-03 ENCOUNTER — Encounter (HOSPITAL_COMMUNITY): Payer: Self-pay

## 2011-04-03 ENCOUNTER — Encounter (HOSPITAL_COMMUNITY): Payer: Medicare Other

## 2011-04-04 ENCOUNTER — Encounter (HOSPITAL_COMMUNITY): Payer: Medicare Other

## 2011-04-04 ENCOUNTER — Encounter (HOSPITAL_COMMUNITY): Payer: Self-pay

## 2011-04-08 ENCOUNTER — Encounter (HOSPITAL_COMMUNITY): Payer: Self-pay

## 2011-04-08 ENCOUNTER — Encounter (HOSPITAL_COMMUNITY): Payer: Medicare Other

## 2011-04-10 ENCOUNTER — Encounter (HOSPITAL_COMMUNITY): Payer: Medicare Other

## 2011-04-10 ENCOUNTER — Encounter (HOSPITAL_COMMUNITY): Payer: Self-pay

## 2011-04-11 ENCOUNTER — Encounter (HOSPITAL_COMMUNITY): Payer: Self-pay

## 2011-04-11 ENCOUNTER — Encounter (HOSPITAL_COMMUNITY): Payer: Medicare Other

## 2011-04-15 ENCOUNTER — Encounter (HOSPITAL_COMMUNITY): Payer: Medicare Other

## 2011-04-15 ENCOUNTER — Encounter (HOSPITAL_COMMUNITY): Payer: Self-pay

## 2011-04-17 ENCOUNTER — Encounter (HOSPITAL_COMMUNITY): Payer: Self-pay

## 2011-04-17 ENCOUNTER — Encounter (HOSPITAL_COMMUNITY): Payer: Medicare Other

## 2011-04-18 ENCOUNTER — Encounter (HOSPITAL_COMMUNITY): Payer: Self-pay

## 2011-04-18 ENCOUNTER — Encounter (HOSPITAL_COMMUNITY): Payer: Medicare Other

## 2011-04-22 ENCOUNTER — Encounter (HOSPITAL_COMMUNITY): Payer: Self-pay

## 2011-04-22 ENCOUNTER — Encounter (HOSPITAL_COMMUNITY): Payer: Medicare Other

## 2011-04-22 ENCOUNTER — Other Ambulatory Visit: Payer: Self-pay | Admitting: *Deleted

## 2011-04-22 ENCOUNTER — Encounter (HOSPITAL_COMMUNITY): Payer: Self-pay | Attending: Cardiovascular Disease

## 2011-04-22 DIAGNOSIS — E785 Hyperlipidemia, unspecified: Secondary | ICD-10-CM | POA: Insufficient documentation

## 2011-04-22 DIAGNOSIS — I471 Supraventricular tachycardia, unspecified: Secondary | ICD-10-CM | POA: Insufficient documentation

## 2011-04-22 DIAGNOSIS — Z7901 Long term (current) use of anticoagulants: Secondary | ICD-10-CM | POA: Insufficient documentation

## 2011-04-22 DIAGNOSIS — Z5189 Encounter for other specified aftercare: Secondary | ICD-10-CM | POA: Insufficient documentation

## 2011-04-22 DIAGNOSIS — I252 Old myocardial infarction: Secondary | ICD-10-CM | POA: Insufficient documentation

## 2011-04-22 DIAGNOSIS — I5181 Takotsubo syndrome: Secondary | ICD-10-CM | POA: Insufficient documentation

## 2011-04-22 DIAGNOSIS — I059 Rheumatic mitral valve disease, unspecified: Secondary | ICD-10-CM | POA: Insufficient documentation

## 2011-04-22 DIAGNOSIS — I10 Essential (primary) hypertension: Secondary | ICD-10-CM | POA: Insufficient documentation

## 2011-04-22 MED ORDER — PANTOPRAZOLE SODIUM 40 MG PO TBEC: 40.0000 mg | DELAYED_RELEASE_TABLET | Freq: Every day | ORAL | Status: DC

## 2011-04-23 ENCOUNTER — Encounter (HOSPITAL_COMMUNITY): Payer: Self-pay

## 2011-04-24 ENCOUNTER — Encounter (HOSPITAL_COMMUNITY): Payer: Self-pay

## 2011-04-24 ENCOUNTER — Encounter (HOSPITAL_COMMUNITY): Payer: Medicare Other

## 2011-04-25 ENCOUNTER — Encounter (HOSPITAL_COMMUNITY): Payer: Self-pay

## 2011-04-25 ENCOUNTER — Encounter (HOSPITAL_COMMUNITY): Payer: Medicare Other

## 2011-04-29 ENCOUNTER — Encounter (HOSPITAL_COMMUNITY): Payer: Self-pay

## 2011-04-29 ENCOUNTER — Encounter (HOSPITAL_COMMUNITY): Payer: Medicare Other

## 2011-04-29 DIAGNOSIS — Z0289 Encounter for other administrative examinations: Secondary | ICD-10-CM | POA: Insufficient documentation

## 2011-05-01 ENCOUNTER — Encounter (HOSPITAL_COMMUNITY): Payer: Self-pay

## 2011-05-01 ENCOUNTER — Encounter (HOSPITAL_COMMUNITY): Payer: Medicare Other

## 2011-05-02 ENCOUNTER — Encounter (HOSPITAL_COMMUNITY): Payer: Medicare Other

## 2011-05-02 ENCOUNTER — Encounter (HOSPITAL_COMMUNITY): Payer: Self-pay

## 2011-05-06 ENCOUNTER — Encounter (HOSPITAL_COMMUNITY): Payer: Self-pay

## 2011-05-06 ENCOUNTER — Encounter (HOSPITAL_COMMUNITY): Payer: Medicare Other

## 2011-05-08 ENCOUNTER — Encounter (HOSPITAL_COMMUNITY): Payer: Medicare Other

## 2011-05-08 ENCOUNTER — Encounter (HOSPITAL_COMMUNITY): Payer: Self-pay

## 2011-05-08 LAB — CBC
HCT: 40.3
MCHC: 34
MCV: 93.2
Platelets: 278
RDW: 13.5

## 2011-05-08 LAB — COMPREHENSIVE METABOLIC PANEL
Albumin: 4.6
BUN: 15
Calcium: 10.3
Creatinine, Ser: 0.82
Glucose, Bld: 110 — ABNORMAL HIGH
Total Protein: 8

## 2011-05-08 LAB — TSH: TSH: 2.576

## 2011-05-09 ENCOUNTER — Encounter (HOSPITAL_COMMUNITY): Payer: Medicare Other

## 2011-05-09 ENCOUNTER — Encounter (HOSPITAL_COMMUNITY): Payer: Self-pay

## 2011-05-13 ENCOUNTER — Encounter (HOSPITAL_COMMUNITY): Payer: Self-pay

## 2011-05-13 ENCOUNTER — Encounter (HOSPITAL_COMMUNITY): Payer: Medicare Other

## 2011-05-13 DIAGNOSIS — K5190 Ulcerative colitis, unspecified, without complications: Secondary | ICD-10-CM | POA: Insufficient documentation

## 2011-05-15 ENCOUNTER — Encounter (HOSPITAL_COMMUNITY): Payer: Medicare Other

## 2011-05-15 ENCOUNTER — Encounter (HOSPITAL_COMMUNITY): Payer: Self-pay

## 2011-05-16 ENCOUNTER — Encounter (HOSPITAL_COMMUNITY): Payer: Medicare Other

## 2011-05-16 ENCOUNTER — Encounter (HOSPITAL_COMMUNITY): Payer: Self-pay

## 2011-05-20 ENCOUNTER — Encounter (HOSPITAL_COMMUNITY): Payer: Self-pay

## 2011-05-20 ENCOUNTER — Encounter (HOSPITAL_COMMUNITY): Payer: Medicare Other

## 2011-05-22 ENCOUNTER — Encounter (HOSPITAL_COMMUNITY): Payer: Self-pay

## 2011-05-22 ENCOUNTER — Encounter: Payer: Self-pay | Admitting: Cardiovascular Disease

## 2011-05-22 ENCOUNTER — Encounter (HOSPITAL_COMMUNITY): Payer: Medicare Other

## 2011-05-22 ENCOUNTER — Ambulatory Visit (INDEPENDENT_AMBULATORY_CARE_PROVIDER_SITE_OTHER): Payer: Medicare Other | Admitting: Cardiovascular Disease

## 2011-05-22 DIAGNOSIS — I5181 Takotsubo syndrome: Secondary | ICD-10-CM | POA: Insufficient documentation

## 2011-05-22 DIAGNOSIS — I10 Essential (primary) hypertension: Secondary | ICD-10-CM | POA: Insufficient documentation

## 2011-05-22 DIAGNOSIS — I059 Rheumatic mitral valve disease, unspecified: Secondary | ICD-10-CM | POA: Insufficient documentation

## 2011-05-22 DIAGNOSIS — Z7901 Long term (current) use of anticoagulants: Secondary | ICD-10-CM | POA: Insufficient documentation

## 2011-05-22 DIAGNOSIS — Z5189 Encounter for other specified aftercare: Secondary | ICD-10-CM | POA: Insufficient documentation

## 2011-05-22 DIAGNOSIS — E785 Hyperlipidemia, unspecified: Secondary | ICD-10-CM | POA: Insufficient documentation

## 2011-05-22 DIAGNOSIS — I428 Other cardiomyopathies: Secondary | ICD-10-CM

## 2011-05-22 DIAGNOSIS — I252 Old myocardial infarction: Secondary | ICD-10-CM | POA: Insufficient documentation

## 2011-05-22 DIAGNOSIS — I471 Supraventricular tachycardia, unspecified: Secondary | ICD-10-CM | POA: Insufficient documentation

## 2011-05-22 MED ORDER — POTASSIUM CHLORIDE CRYS ER 20 MEQ PO TBCR: 20.0000 meq | EXTENDED_RELEASE_TABLET | Freq: Every day | ORAL | Status: DC

## 2011-05-22 MED ORDER — BENAZEPRIL HCL 10 MG PO TABS: 5.0000 mg | ORAL_TABLET | Freq: Every day | ORAL | Status: DC

## 2011-05-22 MED ORDER — FUROSEMIDE 20 MG PO TABS: 20.0000 mg | ORAL_TABLET | Freq: Every day | ORAL | Status: DC

## 2011-05-22 NOTE — Patient Instructions (Signed)
Your physician wants you to follow-up in: 1 YEAR.  You will receive a reminder letter in the mail two months in advance. If you don't receive a letter, please call our office to schedule the follow-up appointment.  Your physician recommends that you continue on your current medications as directed. Please refer to the Current Medication list given to you today.  

## 2011-05-22 NOTE — Progress Notes (Signed)
HPI:   This is a 65 year old woman presented for followup evaluation. She has a history of Takotsubo's cardiomyopathy that presented in 2007. This occurred after a stressful meeting at work. She's had no recurrence of chest pain or pressure. Her LV function is normalized. At the time of cardiac catheterization she had no significant obstructive CAD.  The patient has remained active and she has no exertional symptoms. She denies dyspnea, orthopnea, or PND. She is doing well at present has no complaints today.  Outpatient Encounter Prescriptions as of 05/22/2011  Medication Sig Dispense Refill  . aspirin 81 MG tablet Take 81 mg by mouth daily.        . balsalazide (COLAZAL) 750 MG capsule Take 1 tablet by mouth three times daily  90 capsule  4  . benazepril (LOTENSIN) 10 MG tablet Take 5 mg by mouth daily.       . Calcium Carbonate-Vitamin D (CALCIUM 600 + D PO) Take by mouth 2 (two) times daily.        . cholecalciferol (VITAMIN D) 1000 UNITS tablet Take 1,000 Units by mouth daily.        . furosemide (LASIX) 20 MG tablet Take 20 mg by mouth daily.        . Omega-3 Fatty Acids (FISH OIL) 1200 MG CAPS Take by mouth daily.        . pantoprazole (PROTONIX) 40 MG tablet Take 1 tablet (40 mg total) by mouth daily.  90 tablet  1  . potassium chloride 20 MEQ/15ML (10%) solution Take 20 mEq by mouth daily.        . vitamin C (ASCORBIC ACID) 500 MG tablet Take 500 mg by mouth daily.        Marland Kitchen DISCONTD: vitamin E 400 UNIT capsule Take 400 Units by mouth daily.          Allergies  Allergen Reactions  . Prochlorperazine Edisylate     REACTION: extrapyrimadal syndrome  . Tetanus-Diphtheria Toxoids     REACTION: arm swells    Past Medical History  Diagnosis Date  . Takotsubo cardiomyopathy 2007  . Raynaud's disease   . Mitral valve prolapse   . Crohn disease   . Deep vein thrombosis   . Telangiectasia   . Hyperthyroidism   . GERD (gastroesophageal reflux disease)   . Cardiomyopathy     ROS:  Negative except as per HPI  BP 127/74  Pulse 75  Resp 18  Ht 5\' 7"  (1.702 m)  Wt 125 lb 6.4 oz (56.881 kg)  BMI 19.64 kg/m2  PHYSICAL EXAM: Pt is alert and oriented, NAD HEENT: normal Neck: JVP - normal, carotids 2+= without bruits Lungs: CTA bilaterally CV: RRR without murmur or gallop Abd: soft, NT, Positive BS, no hepatomegaly Ext: no C/C/E, distal pulses intact and equal Skin: warm/dry no rash  EKG:  NSR 68 bpm, left atrial enlargement, otherwise within normal limits.  ASSESSMENT AND PLAN:

## 2011-05-23 ENCOUNTER — Encounter (HOSPITAL_COMMUNITY): Payer: Medicare Other

## 2011-05-23 ENCOUNTER — Encounter (HOSPITAL_COMMUNITY): Payer: Self-pay

## 2011-05-27 ENCOUNTER — Encounter (HOSPITAL_COMMUNITY): Payer: Self-pay

## 2011-05-27 ENCOUNTER — Encounter (HOSPITAL_COMMUNITY): Payer: Medicare Other

## 2011-05-29 ENCOUNTER — Encounter (HOSPITAL_COMMUNITY): Payer: Self-pay

## 2011-05-29 ENCOUNTER — Encounter (HOSPITAL_COMMUNITY): Payer: Medicare Other

## 2011-05-30 ENCOUNTER — Encounter (HOSPITAL_COMMUNITY): Payer: Medicare Other

## 2011-05-30 ENCOUNTER — Encounter (HOSPITAL_COMMUNITY): Payer: Self-pay

## 2011-06-02 ENCOUNTER — Telehealth: Payer: Self-pay | Admitting: Cardiovascular Disease

## 2011-06-02 NOTE — Telephone Encounter (Addendum)
ROi faxed to [facility] @ [phone]  06/02/11/km  Refaxed ROI to [facility] @ [phone]  06/13/11/km  ROI Faxed to [facility] @ [phone] 06/25/11/km  Records Received from [facility] gave to [facility] 07/04/11/km

## 2011-06-03 ENCOUNTER — Encounter (HOSPITAL_COMMUNITY): Payer: Medicare Other

## 2011-06-03 ENCOUNTER — Encounter (HOSPITAL_COMMUNITY): Payer: Self-pay

## 2011-06-05 ENCOUNTER — Encounter (HOSPITAL_COMMUNITY): Payer: Self-pay

## 2011-06-05 ENCOUNTER — Encounter (HOSPITAL_COMMUNITY): Payer: Medicare Other

## 2011-06-06 ENCOUNTER — Encounter (HOSPITAL_COMMUNITY): Payer: Medicare Other

## 2011-06-06 ENCOUNTER — Encounter (HOSPITAL_COMMUNITY)
Admission: RE | Admit: 2011-06-06 | Discharge: 2011-06-06 | Disposition: A | Discharge status: Home / Self Care | Payer: Self-pay | Source: Ambulatory Visit | Attending: Cardiovascular Disease | Admitting: Cardiovascular Disease

## 2011-06-09 ENCOUNTER — Encounter (HOSPITAL_COMMUNITY)
Admission: RE | Admit: 2011-06-09 | Discharge: 2011-06-09 | Disposition: A | Discharge status: Home / Self Care | Payer: Self-pay | Source: Ambulatory Visit | Attending: Cardiovascular Disease | Admitting: Cardiovascular Disease

## 2011-06-10 ENCOUNTER — Encounter (HOSPITAL_COMMUNITY): Payer: Medicare Other

## 2011-06-10 ENCOUNTER — Encounter (HOSPITAL_COMMUNITY): Payer: Self-pay

## 2011-06-10 ENCOUNTER — Encounter (HOSPITAL_COMMUNITY)
Admission: RE | Admit: 2011-06-10 | Discharge: 2011-06-10 | Disposition: A | Discharge status: Home / Self Care | Payer: Self-pay | Source: Ambulatory Visit | Attending: Cardiovascular Disease | Admitting: Cardiovascular Disease

## 2011-06-11 NOTE — Assessment & Plan Note (Addendum)
The patient has had no recurrent symptoms. She has not had there'll years but last assessment of her LV function showed that it was normal. In absence of symptoms I see no reason to repeat this. I would like to see her back in one year for followup.  She has been on benazepril and is tolerating this medication.  She also remains on low dose aspirin.

## 2011-06-12 ENCOUNTER — Encounter (HOSPITAL_COMMUNITY): Payer: Medicare Other

## 2011-06-12 ENCOUNTER — Encounter (HOSPITAL_COMMUNITY): Payer: Self-pay

## 2011-06-13 ENCOUNTER — Encounter (HOSPITAL_COMMUNITY): Payer: Medicare Other

## 2011-06-13 ENCOUNTER — Encounter (HOSPITAL_COMMUNITY): Payer: Self-pay

## 2011-06-17 ENCOUNTER — Encounter (HOSPITAL_COMMUNITY): Payer: Self-pay

## 2011-06-17 ENCOUNTER — Other Ambulatory Visit: Payer: Self-pay | Admitting: Internal Medicine

## 2011-06-17 ENCOUNTER — Encounter (HOSPITAL_COMMUNITY)
Admission: RE | Admit: 2011-06-17 | Discharge: 2011-06-17 | Disposition: A | Discharge status: Home / Self Care | Payer: Self-pay | Source: Ambulatory Visit | Attending: Cardiovascular Disease | Admitting: Cardiovascular Disease

## 2011-06-17 ENCOUNTER — Encounter (HOSPITAL_COMMUNITY): Payer: Medicare Other

## 2011-06-19 ENCOUNTER — Encounter (HOSPITAL_COMMUNITY): Payer: Medicare Other

## 2011-06-19 ENCOUNTER — Encounter (HOSPITAL_COMMUNITY): Payer: Self-pay

## 2011-06-19 ENCOUNTER — Encounter (HOSPITAL_COMMUNITY)
Admission: RE | Admit: 2011-06-19 | Discharge: 2011-06-19 | Disposition: A | Discharge status: Home / Self Care | Payer: Self-pay | Source: Ambulatory Visit | Attending: Cardiovascular Disease | Admitting: Cardiovascular Disease

## 2011-06-20 ENCOUNTER — Encounter (HOSPITAL_COMMUNITY): Payer: Medicare Other

## 2011-06-20 ENCOUNTER — Encounter (HOSPITAL_COMMUNITY)
Admission: RE | Admit: 2011-06-20 | Discharge: 2011-06-20 | Disposition: A | Discharge status: Home / Self Care | Payer: Self-pay | Source: Ambulatory Visit | Attending: Cardiovascular Disease | Admitting: Cardiovascular Disease

## 2011-06-24 ENCOUNTER — Encounter (HOSPITAL_COMMUNITY)
Admission: RE | Admit: 2011-06-24 | Discharge: 2011-06-24 | Disposition: A | Discharge status: Home / Self Care | Payer: Self-pay | Source: Ambulatory Visit | Attending: Cardiovascular Disease | Admitting: Cardiovascular Disease

## 2011-06-24 ENCOUNTER — Encounter (HOSPITAL_COMMUNITY): Payer: Self-pay

## 2011-06-24 ENCOUNTER — Encounter (HOSPITAL_COMMUNITY): Payer: Medicare Other

## 2011-06-24 DIAGNOSIS — I471 Supraventricular tachycardia, unspecified: Secondary | ICD-10-CM | POA: Insufficient documentation

## 2011-06-24 DIAGNOSIS — I059 Rheumatic mitral valve disease, unspecified: Secondary | ICD-10-CM | POA: Insufficient documentation

## 2011-06-24 DIAGNOSIS — I10 Essential (primary) hypertension: Secondary | ICD-10-CM | POA: Insufficient documentation

## 2011-06-24 DIAGNOSIS — Z7901 Long term (current) use of anticoagulants: Secondary | ICD-10-CM | POA: Insufficient documentation

## 2011-06-24 DIAGNOSIS — I252 Old myocardial infarction: Secondary | ICD-10-CM | POA: Insufficient documentation

## 2011-06-24 DIAGNOSIS — E785 Hyperlipidemia, unspecified: Secondary | ICD-10-CM | POA: Insufficient documentation

## 2011-06-24 DIAGNOSIS — Z5189 Encounter for other specified aftercare: Secondary | ICD-10-CM | POA: Insufficient documentation

## 2011-06-24 DIAGNOSIS — I5181 Takotsubo syndrome: Secondary | ICD-10-CM | POA: Insufficient documentation

## 2011-06-26 ENCOUNTER — Encounter (HOSPITAL_COMMUNITY): Payer: Self-pay

## 2011-06-26 ENCOUNTER — Encounter (HOSPITAL_COMMUNITY)
Admission: RE | Admit: 2011-06-26 | Discharge: 2011-06-26 | Disposition: A | Discharge status: Home / Self Care | Payer: Self-pay | Source: Ambulatory Visit | Attending: Cardiovascular Disease | Admitting: Cardiovascular Disease

## 2011-06-26 ENCOUNTER — Encounter (HOSPITAL_COMMUNITY): Payer: Medicare Other

## 2011-06-27 ENCOUNTER — Encounter (HOSPITAL_COMMUNITY): Payer: Medicare Other

## 2011-06-27 ENCOUNTER — Encounter (HOSPITAL_COMMUNITY)
Admission: RE | Admit: 2011-06-27 | Discharge: 2011-06-27 | Disposition: A | Discharge status: Home / Self Care | Payer: Self-pay | Source: Ambulatory Visit | Attending: Cardiovascular Disease | Admitting: Cardiovascular Disease

## 2011-06-30 ENCOUNTER — Encounter (HOSPITAL_COMMUNITY)
Admission: RE | Admit: 2011-06-30 | Discharge: 2011-06-30 | Disposition: A | Discharge status: Home / Self Care | Payer: Self-pay | Source: Ambulatory Visit | Attending: Cardiovascular Disease | Admitting: Cardiovascular Disease

## 2011-06-30 ENCOUNTER — Encounter: Payer: Self-pay | Admitting: Cardiovascular Disease

## 2011-07-01 ENCOUNTER — Encounter (HOSPITAL_COMMUNITY)
Admission: RE | Admit: 2011-07-01 | Discharge: 2011-07-01 | Disposition: A | Discharge status: Home / Self Care | Payer: Self-pay | Source: Ambulatory Visit | Attending: Cardiovascular Disease | Admitting: Cardiovascular Disease

## 2011-07-01 ENCOUNTER — Encounter (HOSPITAL_COMMUNITY): Payer: Medicare Other

## 2011-07-01 ENCOUNTER — Encounter (HOSPITAL_COMMUNITY): Payer: Self-pay

## 2011-07-03 ENCOUNTER — Encounter (HOSPITAL_COMMUNITY): Payer: Medicare Other

## 2011-07-03 ENCOUNTER — Encounter (HOSPITAL_COMMUNITY): Payer: Self-pay

## 2011-07-04 ENCOUNTER — Encounter (HOSPITAL_COMMUNITY): Payer: Medicare Other

## 2011-07-04 ENCOUNTER — Encounter (HOSPITAL_COMMUNITY): Payer: Self-pay

## 2011-07-08 ENCOUNTER — Encounter (HOSPITAL_COMMUNITY): Payer: Self-pay

## 2011-07-08 ENCOUNTER — Encounter (HOSPITAL_COMMUNITY): Payer: Medicare Other

## 2011-07-10 ENCOUNTER — Encounter (HOSPITAL_COMMUNITY): Payer: Self-pay

## 2011-07-10 ENCOUNTER — Encounter (HOSPITAL_COMMUNITY): Payer: Medicare Other

## 2011-07-10 ENCOUNTER — Encounter (HOSPITAL_COMMUNITY)
Admission: RE | Admit: 2011-07-10 | Discharge: 2011-07-10 | Disposition: A | Discharge status: Home / Self Care | Payer: Self-pay | Source: Ambulatory Visit | Attending: Cardiovascular Disease | Admitting: Cardiovascular Disease

## 2011-07-11 ENCOUNTER — Encounter (HOSPITAL_COMMUNITY): Payer: Medicare Other

## 2011-07-11 ENCOUNTER — Other Ambulatory Visit: Payer: Self-pay | Admitting: Internal Medicine

## 2011-07-11 ENCOUNTER — Encounter (HOSPITAL_COMMUNITY)
Admission: RE | Admit: 2011-07-11 | Discharge: 2011-07-11 | Disposition: A | Discharge status: Home / Self Care | Payer: Self-pay | Source: Ambulatory Visit | Attending: Cardiovascular Disease | Admitting: Cardiovascular Disease

## 2011-07-15 ENCOUNTER — Encounter (HOSPITAL_COMMUNITY): Payer: Self-pay

## 2011-07-15 ENCOUNTER — Encounter (HOSPITAL_COMMUNITY): Payer: Medicare Other

## 2011-07-17 ENCOUNTER — Encounter (HOSPITAL_COMMUNITY): Payer: Self-pay

## 2011-07-17 ENCOUNTER — Encounter (HOSPITAL_COMMUNITY): Payer: Medicare Other

## 2011-07-18 ENCOUNTER — Encounter (HOSPITAL_COMMUNITY): Payer: Self-pay

## 2011-07-18 ENCOUNTER — Encounter (HOSPITAL_COMMUNITY): Payer: Medicare Other

## 2011-07-22 ENCOUNTER — Encounter (HOSPITAL_COMMUNITY): Payer: Medicare Other

## 2011-07-22 ENCOUNTER — Encounter (HOSPITAL_COMMUNITY): Payer: Self-pay

## 2011-07-24 ENCOUNTER — Encounter (HOSPITAL_COMMUNITY)
Admission: RE | Admit: 2011-07-24 | Discharge: 2011-07-24 | Disposition: A | Discharge status: Home / Self Care | Payer: Self-pay | Source: Ambulatory Visit | Attending: Cardiovascular Disease | Admitting: Cardiovascular Disease

## 2011-07-24 ENCOUNTER — Encounter (HOSPITAL_COMMUNITY): Payer: Self-pay

## 2011-07-24 ENCOUNTER — Encounter (HOSPITAL_COMMUNITY): Payer: Medicare Other

## 2011-07-24 DIAGNOSIS — I5181 Takotsubo syndrome: Secondary | ICD-10-CM | POA: Insufficient documentation

## 2011-07-24 DIAGNOSIS — I471 Supraventricular tachycardia, unspecified: Secondary | ICD-10-CM | POA: Insufficient documentation

## 2011-07-24 DIAGNOSIS — I252 Old myocardial infarction: Secondary | ICD-10-CM | POA: Insufficient documentation

## 2011-07-24 DIAGNOSIS — Z5189 Encounter for other specified aftercare: Secondary | ICD-10-CM | POA: Insufficient documentation

## 2011-07-24 DIAGNOSIS — Z7901 Long term (current) use of anticoagulants: Secondary | ICD-10-CM | POA: Insufficient documentation

## 2011-07-24 DIAGNOSIS — I10 Essential (primary) hypertension: Secondary | ICD-10-CM | POA: Insufficient documentation

## 2011-07-24 DIAGNOSIS — E785 Hyperlipidemia, unspecified: Secondary | ICD-10-CM | POA: Insufficient documentation

## 2011-07-24 DIAGNOSIS — I059 Rheumatic mitral valve disease, unspecified: Secondary | ICD-10-CM | POA: Insufficient documentation

## 2011-07-25 ENCOUNTER — Encounter (HOSPITAL_COMMUNITY)
Admission: RE | Admit: 2011-07-25 | Discharge: 2011-07-25 | Disposition: A | Discharge status: Home / Self Care | Payer: Self-pay | Source: Ambulatory Visit | Attending: Cardiovascular Disease | Admitting: Cardiovascular Disease

## 2011-07-25 ENCOUNTER — Encounter (HOSPITAL_COMMUNITY): Payer: Medicare Other

## 2011-07-29 ENCOUNTER — Encounter (HOSPITAL_COMMUNITY): Payer: Medicare Other

## 2011-07-29 ENCOUNTER — Encounter (HOSPITAL_COMMUNITY): Payer: Self-pay

## 2011-07-29 ENCOUNTER — Encounter (HOSPITAL_COMMUNITY)
Admission: RE | Admit: 2011-07-29 | Discharge: 2011-07-29 | Disposition: A | Discharge status: Home / Self Care | Payer: Self-pay | Source: Ambulatory Visit | Attending: Cardiovascular Disease | Admitting: Cardiovascular Disease

## 2011-07-31 ENCOUNTER — Encounter (HOSPITAL_COMMUNITY): Payer: Self-pay

## 2011-07-31 ENCOUNTER — Encounter (HOSPITAL_COMMUNITY): Payer: Medicare Other

## 2011-07-31 ENCOUNTER — Encounter (HOSPITAL_COMMUNITY): Admission: RE | Admit: 2011-07-31 | Payer: Self-pay | Source: Ambulatory Visit

## 2011-08-01 ENCOUNTER — Encounter (HOSPITAL_COMMUNITY): Payer: Medicare Other

## 2011-08-01 ENCOUNTER — Encounter (HOSPITAL_COMMUNITY)
Admission: RE | Admit: 2011-08-01 | Discharge: 2011-08-01 | Disposition: A | Discharge status: Home / Self Care | Payer: Self-pay | Source: Ambulatory Visit | Attending: Cardiovascular Disease | Admitting: Cardiovascular Disease

## 2011-08-05 ENCOUNTER — Encounter (HOSPITAL_COMMUNITY): Payer: Self-pay

## 2011-08-05 ENCOUNTER — Encounter (HOSPITAL_COMMUNITY)
Admission: RE | Admit: 2011-08-05 | Discharge: 2011-08-05 | Disposition: A | Discharge status: Home / Self Care | Payer: Self-pay | Source: Ambulatory Visit | Attending: Cardiovascular Disease | Admitting: Cardiovascular Disease

## 2011-08-05 ENCOUNTER — Encounter (HOSPITAL_COMMUNITY): Payer: Medicare Other

## 2011-08-07 ENCOUNTER — Encounter (HOSPITAL_COMMUNITY): Payer: Medicare Other

## 2011-08-07 ENCOUNTER — Encounter (HOSPITAL_COMMUNITY): Payer: Self-pay

## 2011-08-07 ENCOUNTER — Encounter (HOSPITAL_COMMUNITY)
Admission: RE | Admit: 2011-08-07 | Discharge: 2011-08-07 | Disposition: A | Discharge status: Home / Self Care | Payer: Self-pay | Source: Ambulatory Visit | Attending: Cardiovascular Disease | Admitting: Cardiovascular Disease

## 2011-08-08 ENCOUNTER — Encounter (HOSPITAL_COMMUNITY): Payer: Self-pay

## 2011-08-08 ENCOUNTER — Encounter (HOSPITAL_COMMUNITY): Payer: Medicare Other

## 2011-08-12 ENCOUNTER — Encounter (HOSPITAL_COMMUNITY)
Admission: RE | Admit: 2011-08-12 | Discharge: 2011-08-12 | Disposition: A | Discharge status: Home / Self Care | Payer: Self-pay | Source: Ambulatory Visit | Attending: Cardiovascular Disease | Admitting: Cardiovascular Disease

## 2011-08-14 ENCOUNTER — Encounter (HOSPITAL_COMMUNITY): Payer: Self-pay

## 2011-08-15 ENCOUNTER — Encounter (HOSPITAL_COMMUNITY)
Admission: RE | Admit: 2011-08-15 | Discharge: 2011-08-15 | Disposition: A | Discharge status: Home / Self Care | Payer: Self-pay | Source: Ambulatory Visit | Attending: Cardiovascular Disease | Admitting: Cardiovascular Disease

## 2011-08-19 ENCOUNTER — Encounter (HOSPITAL_COMMUNITY)
Admission: RE | Admit: 2011-08-19 | Discharge: 2011-08-19 | Disposition: A | Discharge status: Home / Self Care | Payer: Self-pay | Source: Ambulatory Visit | Attending: Cardiovascular Disease | Admitting: Cardiovascular Disease

## 2011-08-21 ENCOUNTER — Encounter (HOSPITAL_COMMUNITY)
Admission: RE | Admit: 2011-08-21 | Discharge: 2011-08-21 | Disposition: A | Discharge status: Home / Self Care | Payer: Self-pay | Source: Ambulatory Visit | Attending: Cardiovascular Disease | Admitting: Cardiovascular Disease

## 2011-08-22 ENCOUNTER — Encounter (HOSPITAL_COMMUNITY)
Admission: RE | Admit: 2011-08-22 | Discharge: 2011-08-22 | Disposition: A | Discharge status: Home / Self Care | Payer: Self-pay | Source: Ambulatory Visit | Attending: Cardiovascular Disease | Admitting: Cardiovascular Disease

## 2011-08-22 DIAGNOSIS — I059 Rheumatic mitral valve disease, unspecified: Secondary | ICD-10-CM | POA: Insufficient documentation

## 2011-08-22 DIAGNOSIS — I252 Old myocardial infarction: Secondary | ICD-10-CM | POA: Insufficient documentation

## 2011-08-22 DIAGNOSIS — E785 Hyperlipidemia, unspecified: Secondary | ICD-10-CM | POA: Insufficient documentation

## 2011-08-22 DIAGNOSIS — Z5189 Encounter for other specified aftercare: Secondary | ICD-10-CM | POA: Insufficient documentation

## 2011-08-22 DIAGNOSIS — Z7901 Long term (current) use of anticoagulants: Secondary | ICD-10-CM | POA: Insufficient documentation

## 2011-08-22 DIAGNOSIS — I471 Supraventricular tachycardia, unspecified: Secondary | ICD-10-CM | POA: Insufficient documentation

## 2011-08-22 DIAGNOSIS — I10 Essential (primary) hypertension: Secondary | ICD-10-CM | POA: Insufficient documentation

## 2011-08-22 DIAGNOSIS — I5181 Takotsubo syndrome: Secondary | ICD-10-CM | POA: Insufficient documentation

## 2011-08-26 ENCOUNTER — Encounter (HOSPITAL_COMMUNITY)
Admission: RE | Admit: 2011-08-26 | Discharge: 2011-08-26 | Disposition: A | Discharge status: Home / Self Care | Payer: Self-pay | Source: Ambulatory Visit | Attending: Cardiovascular Disease | Admitting: Cardiovascular Disease

## 2011-08-28 ENCOUNTER — Encounter (HOSPITAL_COMMUNITY)
Admission: RE | Admit: 2011-08-28 | Discharge: 2011-08-28 | Disposition: A | Discharge status: Home / Self Care | Payer: Self-pay | Source: Ambulatory Visit | Attending: Cardiovascular Disease | Admitting: Cardiovascular Disease

## 2011-08-29 ENCOUNTER — Encounter (HOSPITAL_COMMUNITY)
Admission: RE | Admit: 2011-08-29 | Discharge: 2011-08-29 | Disposition: A | Discharge status: Home / Self Care | Payer: Self-pay | Source: Ambulatory Visit | Attending: Cardiovascular Disease | Admitting: Cardiovascular Disease

## 2011-09-02 ENCOUNTER — Encounter (HOSPITAL_COMMUNITY)
Admission: RE | Admit: 2011-09-02 | Discharge: 2011-09-02 | Disposition: A | Discharge status: Home / Self Care | Payer: Self-pay | Source: Ambulatory Visit | Attending: Cardiovascular Disease | Admitting: Cardiovascular Disease

## 2011-09-04 ENCOUNTER — Encounter (HOSPITAL_COMMUNITY): Payer: Self-pay

## 2011-09-05 ENCOUNTER — Ambulatory Visit (HOSPITAL_COMMUNITY)
Admission: RE | Admit: 2011-09-05 | Discharge: 2011-09-05 | Disposition: A | Discharge status: Home / Self Care | Payer: Medicare Other | Source: Ambulatory Visit | Attending: Obstetrics and Gynecology | Admitting: Obstetrics and Gynecology

## 2011-09-05 ENCOUNTER — Ambulatory Visit (HOSPITAL_COMMUNITY): Admission: RE | Admit: 2011-09-05 | Payer: Medicare Other | Source: Ambulatory Visit

## 2011-09-05 ENCOUNTER — Encounter (HOSPITAL_COMMUNITY): Payer: Self-pay

## 2011-09-05 ENCOUNTER — Other Ambulatory Visit (HOSPITAL_COMMUNITY): Payer: Self-pay | Admitting: Obstetrics and Gynecology

## 2011-09-05 DIAGNOSIS — Z1231 Encounter for screening mammogram for malignant neoplasm of breast: Secondary | ICD-10-CM

## 2011-09-09 ENCOUNTER — Encounter (HOSPITAL_COMMUNITY)
Admission: RE | Admit: 2011-09-09 | Discharge: 2011-09-09 | Disposition: A | Discharge status: Home / Self Care | Payer: Self-pay | Source: Ambulatory Visit | Attending: Cardiovascular Disease | Admitting: Cardiovascular Disease

## 2011-09-11 ENCOUNTER — Encounter (HOSPITAL_COMMUNITY)
Admission: RE | Admit: 2011-09-11 | Discharge: 2011-09-11 | Disposition: A | Discharge status: Home / Self Care | Payer: Self-pay | Source: Ambulatory Visit | Attending: Cardiovascular Disease | Admitting: Cardiovascular Disease

## 2011-09-12 ENCOUNTER — Encounter (HOSPITAL_COMMUNITY): Payer: Self-pay

## 2011-09-16 ENCOUNTER — Encounter (HOSPITAL_COMMUNITY): Payer: Self-pay

## 2011-09-18 ENCOUNTER — Encounter (HOSPITAL_COMMUNITY)
Admission: RE | Admit: 2011-09-18 | Discharge: 2011-09-18 | Disposition: A | Discharge status: Home / Self Care | Payer: Self-pay | Source: Ambulatory Visit | Attending: Cardiovascular Disease | Admitting: Cardiovascular Disease

## 2011-09-19 ENCOUNTER — Encounter (HOSPITAL_COMMUNITY)
Admission: RE | Admit: 2011-09-19 | Discharge: 2011-09-19 | Disposition: A | Discharge status: Home / Self Care | Payer: Self-pay | Source: Ambulatory Visit | Attending: Cardiovascular Disease | Admitting: Cardiovascular Disease

## 2011-09-19 DIAGNOSIS — I471 Supraventricular tachycardia, unspecified: Secondary | ICD-10-CM | POA: Insufficient documentation

## 2011-09-19 DIAGNOSIS — Z5189 Encounter for other specified aftercare: Secondary | ICD-10-CM | POA: Insufficient documentation

## 2011-09-19 DIAGNOSIS — I252 Old myocardial infarction: Secondary | ICD-10-CM | POA: Insufficient documentation

## 2011-09-19 DIAGNOSIS — I059 Rheumatic mitral valve disease, unspecified: Secondary | ICD-10-CM | POA: Insufficient documentation

## 2011-09-19 DIAGNOSIS — I5181 Takotsubo syndrome: Secondary | ICD-10-CM | POA: Insufficient documentation

## 2011-09-19 DIAGNOSIS — E785 Hyperlipidemia, unspecified: Secondary | ICD-10-CM | POA: Insufficient documentation

## 2011-09-19 DIAGNOSIS — Z7901 Long term (current) use of anticoagulants: Secondary | ICD-10-CM | POA: Insufficient documentation

## 2011-09-19 DIAGNOSIS — I10 Essential (primary) hypertension: Secondary | ICD-10-CM | POA: Insufficient documentation

## 2011-09-23 ENCOUNTER — Encounter (HOSPITAL_COMMUNITY)
Admission: RE | Admit: 2011-09-23 | Discharge: 2011-09-23 | Disposition: A | Discharge status: Home / Self Care | Payer: Self-pay | Source: Ambulatory Visit | Attending: Cardiovascular Disease | Admitting: Cardiovascular Disease

## 2011-09-25 ENCOUNTER — Encounter (HOSPITAL_COMMUNITY)
Admission: RE | Admit: 2011-09-25 | Discharge: 2011-09-25 | Disposition: A | Discharge status: Home / Self Care | Payer: Self-pay | Source: Ambulatory Visit | Attending: Cardiovascular Disease | Admitting: Cardiovascular Disease

## 2011-09-26 ENCOUNTER — Encounter (HOSPITAL_COMMUNITY)
Admission: RE | Admit: 2011-09-26 | Discharge: 2011-09-26 | Disposition: A | Discharge status: Home / Self Care | Payer: Self-pay | Source: Ambulatory Visit | Attending: Cardiovascular Disease | Admitting: Cardiovascular Disease

## 2011-09-30 ENCOUNTER — Encounter (HOSPITAL_COMMUNITY)
Admission: RE | Admit: 2011-09-30 | Discharge: 2011-09-30 | Disposition: A | Discharge status: Home / Self Care | Payer: Self-pay | Source: Ambulatory Visit | Attending: Cardiovascular Disease | Admitting: Cardiovascular Disease

## 2011-09-30 ENCOUNTER — Other Ambulatory Visit: Payer: Self-pay | Admitting: Cardiovascular Disease

## 2011-10-01 ENCOUNTER — Encounter (HOSPITAL_COMMUNITY)
Admission: RE | Admit: 2011-10-01 | Discharge: 2011-10-01 | Disposition: A | Discharge status: Home / Self Care | Payer: Self-pay | Source: Ambulatory Visit | Attending: Cardiovascular Disease | Admitting: Cardiovascular Disease

## 2011-10-01 ENCOUNTER — Encounter: Payer: Self-pay | Admitting: Internal Medicine

## 2011-10-02 ENCOUNTER — Encounter (HOSPITAL_COMMUNITY)
Admission: RE | Admit: 2011-10-02 | Discharge: 2011-10-02 | Disposition: A | Discharge status: Home / Self Care | Payer: Self-pay | Source: Ambulatory Visit | Attending: Cardiovascular Disease | Admitting: Cardiovascular Disease

## 2011-10-03 ENCOUNTER — Encounter (HOSPITAL_COMMUNITY): Payer: Self-pay

## 2011-10-07 ENCOUNTER — Encounter (HOSPITAL_COMMUNITY)
Admission: RE | Admit: 2011-10-07 | Discharge: 2011-10-07 | Disposition: A | Discharge status: Home / Self Care | Payer: Self-pay | Source: Ambulatory Visit | Attending: Cardiovascular Disease | Admitting: Cardiovascular Disease

## 2011-10-09 ENCOUNTER — Encounter (HOSPITAL_COMMUNITY)
Admission: RE | Admit: 2011-10-09 | Discharge: 2011-10-09 | Disposition: A | Discharge status: Home / Self Care | Payer: Self-pay | Source: Ambulatory Visit | Attending: Cardiovascular Disease | Admitting: Cardiovascular Disease

## 2011-10-10 ENCOUNTER — Encounter (HOSPITAL_COMMUNITY)
Admission: RE | Admit: 2011-10-10 | Discharge: 2011-10-10 | Disposition: A | Discharge status: Home / Self Care | Payer: Self-pay | Source: Ambulatory Visit | Attending: Cardiovascular Disease | Admitting: Cardiovascular Disease

## 2011-10-14 ENCOUNTER — Encounter (HOSPITAL_COMMUNITY)
Admission: RE | Admit: 2011-10-14 | Discharge: 2011-10-14 | Disposition: A | Discharge status: Home / Self Care | Payer: Self-pay | Source: Ambulatory Visit | Attending: Cardiovascular Disease | Admitting: Cardiovascular Disease

## 2011-10-16 ENCOUNTER — Encounter (HOSPITAL_COMMUNITY)
Admission: RE | Admit: 2011-10-16 | Discharge: 2011-10-16 | Disposition: A | Discharge status: Home / Self Care | Payer: Self-pay | Source: Ambulatory Visit | Attending: Cardiovascular Disease | Admitting: Cardiovascular Disease

## 2011-10-17 ENCOUNTER — Encounter (HOSPITAL_COMMUNITY)
Admission: RE | Admit: 2011-10-17 | Discharge: 2011-10-17 | Disposition: A | Discharge status: Home / Self Care | Payer: Self-pay | Source: Ambulatory Visit | Attending: Cardiovascular Disease | Admitting: Cardiovascular Disease

## 2011-10-20 ENCOUNTER — Other Ambulatory Visit: Payer: Self-pay | Admitting: Internal Medicine

## 2011-10-20 MED ORDER — BALSALAZIDE DISODIUM 750 MG PO CAPS: ORAL_CAPSULE | ORAL | Status: DC

## 2011-10-20 NOTE — Telephone Encounter (Signed)
Non-emergent Has been waiting on refill on colazal. Refills will be provided.  Question about the need for repeat colonoscopy?? Pt thought was due June 2013, but letter now recommend June 2015 and patient is concerned this interval. Would like more information.  Dr. Juanda Chance can follow-up re: colon interval.

## 2011-10-20 NOTE — Telephone Encounter (Signed)
Thanx, she is right , she has had UC x 20 uears, last colon 12/2008, chronic active colitis. Please schedule recall colon for 12/2011

## 2011-10-21 ENCOUNTER — Encounter (HOSPITAL_COMMUNITY)
Admission: RE | Admit: 2011-10-21 | Discharge: 2011-10-21 | Disposition: A | Discharge status: Home / Self Care | Payer: Self-pay | Source: Ambulatory Visit | Attending: Cardiovascular Disease | Admitting: Cardiovascular Disease

## 2011-10-21 DIAGNOSIS — I252 Old myocardial infarction: Secondary | ICD-10-CM | POA: Insufficient documentation

## 2011-10-21 DIAGNOSIS — Z7901 Long term (current) use of anticoagulants: Secondary | ICD-10-CM | POA: Insufficient documentation

## 2011-10-21 DIAGNOSIS — I5181 Takotsubo syndrome: Secondary | ICD-10-CM | POA: Insufficient documentation

## 2011-10-21 DIAGNOSIS — I059 Rheumatic mitral valve disease, unspecified: Secondary | ICD-10-CM | POA: Insufficient documentation

## 2011-10-21 DIAGNOSIS — I471 Supraventricular tachycardia, unspecified: Secondary | ICD-10-CM | POA: Insufficient documentation

## 2011-10-21 DIAGNOSIS — I10 Essential (primary) hypertension: Secondary | ICD-10-CM | POA: Insufficient documentation

## 2011-10-21 DIAGNOSIS — Z5189 Encounter for other specified aftercare: Secondary | ICD-10-CM | POA: Insufficient documentation

## 2011-10-21 DIAGNOSIS — E785 Hyperlipidemia, unspecified: Secondary | ICD-10-CM | POA: Insufficient documentation

## 2011-10-21 NOTE — Telephone Encounter (Signed)
Corrected recall colon in computer.

## 2011-10-21 NOTE — Telephone Encounter (Signed)
Patient notified that recall is 12/2011. Also verified her rx has been refilled.

## 2011-10-23 ENCOUNTER — Encounter (HOSPITAL_COMMUNITY)
Admission: RE | Admit: 2011-10-23 | Discharge: 2011-10-23 | Disposition: A | Discharge status: Home / Self Care | Payer: Self-pay | Source: Ambulatory Visit | Attending: Cardiovascular Disease | Admitting: Cardiovascular Disease

## 2011-10-24 ENCOUNTER — Encounter (HOSPITAL_COMMUNITY)
Admission: RE | Admit: 2011-10-24 | Discharge: 2011-10-24 | Disposition: A | Discharge status: Home / Self Care | Payer: Self-pay | Source: Ambulatory Visit | Attending: Cardiovascular Disease | Admitting: Cardiovascular Disease

## 2011-10-28 ENCOUNTER — Encounter (HOSPITAL_COMMUNITY)
Admission: RE | Admit: 2011-10-28 | Discharge: 2011-10-28 | Disposition: A | Discharge status: Home / Self Care | Payer: Self-pay | Source: Ambulatory Visit | Attending: Cardiovascular Disease | Admitting: Cardiovascular Disease

## 2011-10-30 ENCOUNTER — Encounter (HOSPITAL_COMMUNITY)
Admission: RE | Admit: 2011-10-30 | Discharge: 2011-10-30 | Disposition: A | Discharge status: Home / Self Care | Payer: Self-pay | Source: Ambulatory Visit | Attending: Cardiovascular Disease | Admitting: Cardiovascular Disease

## 2011-10-31 ENCOUNTER — Encounter (HOSPITAL_COMMUNITY)
Admission: RE | Admit: 2011-10-31 | Discharge: 2011-10-31 | Disposition: A | Discharge status: Home / Self Care | Payer: Self-pay | Source: Ambulatory Visit | Attending: Cardiovascular Disease | Admitting: Cardiovascular Disease

## 2011-11-04 ENCOUNTER — Encounter (HOSPITAL_COMMUNITY)
Admission: RE | Admit: 2011-11-04 | Discharge: 2011-11-04 | Disposition: A | Discharge status: Home / Self Care | Payer: Self-pay | Source: Ambulatory Visit | Attending: Cardiovascular Disease | Admitting: Cardiovascular Disease

## 2011-11-06 ENCOUNTER — Encounter (HOSPITAL_COMMUNITY)
Admission: RE | Admit: 2011-11-06 | Discharge: 2011-11-06 | Disposition: A | Discharge status: Home / Self Care | Payer: Self-pay | Source: Ambulatory Visit | Attending: Cardiovascular Disease | Admitting: Cardiovascular Disease

## 2011-11-07 ENCOUNTER — Encounter (HOSPITAL_COMMUNITY)
Admission: RE | Admit: 2011-11-07 | Discharge: 2011-11-07 | Disposition: A | Discharge status: Home / Self Care | Payer: Self-pay | Source: Ambulatory Visit | Attending: Cardiovascular Disease | Admitting: Cardiovascular Disease

## 2011-11-11 ENCOUNTER — Encounter (HOSPITAL_COMMUNITY)
Admission: RE | Admit: 2011-11-11 | Discharge: 2011-11-11 | Disposition: A | Discharge status: Home / Self Care | Payer: Self-pay | Source: Ambulatory Visit | Attending: Cardiovascular Disease | Admitting: Cardiovascular Disease

## 2011-11-13 ENCOUNTER — Encounter (HOSPITAL_COMMUNITY): Payer: Self-pay

## 2011-11-14 ENCOUNTER — Encounter (HOSPITAL_COMMUNITY): Payer: Self-pay

## 2011-11-18 ENCOUNTER — Encounter (HOSPITAL_COMMUNITY)
Admission: RE | Admit: 2011-11-18 | Discharge: 2011-11-18 | Disposition: A | Discharge status: Home / Self Care | Payer: Self-pay | Source: Ambulatory Visit | Attending: Cardiovascular Disease | Admitting: Cardiovascular Disease

## 2011-11-19 ENCOUNTER — Encounter (HOSPITAL_COMMUNITY)
Admission: RE | Admit: 2011-11-19 | Discharge: 2011-11-19 | Disposition: A | Discharge status: Home / Self Care | Payer: Self-pay | Source: Ambulatory Visit | Attending: Cardiovascular Disease | Admitting: Cardiovascular Disease

## 2011-11-19 DIAGNOSIS — I471 Supraventricular tachycardia, unspecified: Secondary | ICD-10-CM | POA: Insufficient documentation

## 2011-11-19 DIAGNOSIS — I5181 Takotsubo syndrome: Secondary | ICD-10-CM | POA: Insufficient documentation

## 2011-11-19 DIAGNOSIS — I059 Rheumatic mitral valve disease, unspecified: Secondary | ICD-10-CM | POA: Insufficient documentation

## 2011-11-19 DIAGNOSIS — I252 Old myocardial infarction: Secondary | ICD-10-CM | POA: Insufficient documentation

## 2011-11-19 DIAGNOSIS — E785 Hyperlipidemia, unspecified: Secondary | ICD-10-CM | POA: Insufficient documentation

## 2011-11-19 DIAGNOSIS — I10 Essential (primary) hypertension: Secondary | ICD-10-CM | POA: Insufficient documentation

## 2011-11-19 DIAGNOSIS — Z7901 Long term (current) use of anticoagulants: Secondary | ICD-10-CM | POA: Insufficient documentation

## 2011-11-19 DIAGNOSIS — Z5189 Encounter for other specified aftercare: Secondary | ICD-10-CM | POA: Insufficient documentation

## 2011-11-20 ENCOUNTER — Encounter (HOSPITAL_COMMUNITY)
Admission: RE | Admit: 2011-11-20 | Discharge: 2011-11-20 | Disposition: A | Discharge status: Home / Self Care | Payer: Self-pay | Source: Ambulatory Visit | Attending: Cardiovascular Disease | Admitting: Cardiovascular Disease

## 2011-11-21 ENCOUNTER — Encounter (HOSPITAL_COMMUNITY): Payer: Self-pay

## 2011-11-25 ENCOUNTER — Encounter (HOSPITAL_COMMUNITY)
Admission: RE | Admit: 2011-11-25 | Discharge: 2011-11-25 | Disposition: A | Discharge status: Home / Self Care | Payer: Self-pay | Source: Ambulatory Visit | Attending: Cardiovascular Disease | Admitting: Cardiovascular Disease

## 2011-11-27 ENCOUNTER — Encounter (HOSPITAL_COMMUNITY)
Admission: RE | Admit: 2011-11-27 | Discharge: 2011-11-27 | Disposition: A | Discharge status: Home / Self Care | Payer: Self-pay | Source: Ambulatory Visit | Attending: Cardiovascular Disease | Admitting: Cardiovascular Disease

## 2011-11-28 ENCOUNTER — Encounter (HOSPITAL_COMMUNITY)
Admission: RE | Admit: 2011-11-28 | Discharge: 2011-11-28 | Disposition: A | Discharge status: Home / Self Care | Payer: Self-pay | Source: Ambulatory Visit | Attending: Cardiovascular Disease | Admitting: Cardiovascular Disease

## 2011-12-02 ENCOUNTER — Encounter (HOSPITAL_COMMUNITY)
Admission: RE | Admit: 2011-12-02 | Discharge: 2011-12-02 | Disposition: A | Discharge status: Home / Self Care | Payer: Self-pay | Source: Ambulatory Visit | Attending: Cardiovascular Disease | Admitting: Cardiovascular Disease

## 2011-12-03 ENCOUNTER — Encounter (HOSPITAL_COMMUNITY)
Admission: RE | Admit: 2011-12-03 | Discharge: 2011-12-03 | Disposition: A | Discharge status: Home / Self Care | Payer: Self-pay | Source: Ambulatory Visit | Attending: Cardiovascular Disease | Admitting: Cardiovascular Disease

## 2011-12-04 ENCOUNTER — Encounter (HOSPITAL_COMMUNITY)
Admission: RE | Admit: 2011-12-04 | Discharge: 2011-12-04 | Disposition: A | Discharge status: Home / Self Care | Payer: Self-pay | Source: Ambulatory Visit | Attending: Cardiovascular Disease | Admitting: Cardiovascular Disease

## 2011-12-05 ENCOUNTER — Encounter (HOSPITAL_COMMUNITY): Admission: RE | Admit: 2011-12-05 | Payer: Self-pay | Source: Ambulatory Visit

## 2011-12-09 ENCOUNTER — Encounter (HOSPITAL_COMMUNITY)
Admission: RE | Admit: 2011-12-09 | Discharge: 2011-12-09 | Disposition: A | Discharge status: Home / Self Care | Payer: Self-pay | Source: Ambulatory Visit | Attending: Cardiovascular Disease | Admitting: Cardiovascular Disease

## 2011-12-11 ENCOUNTER — Encounter (HOSPITAL_COMMUNITY)
Admission: RE | Admit: 2011-12-11 | Discharge: 2011-12-11 | Disposition: A | Discharge status: Home / Self Care | Payer: Self-pay | Source: Ambulatory Visit | Attending: Cardiovascular Disease | Admitting: Cardiovascular Disease

## 2011-12-12 ENCOUNTER — Encounter (HOSPITAL_COMMUNITY)
Admission: RE | Admit: 2011-12-12 | Discharge: 2011-12-12 | Disposition: A | Discharge status: Home / Self Care | Payer: Self-pay | Source: Ambulatory Visit | Attending: Cardiovascular Disease | Admitting: Cardiovascular Disease

## 2011-12-12 DIAGNOSIS — IMO0002 Reserved for concepts with insufficient information to code with codable children: Secondary | ICD-10-CM | POA: Insufficient documentation

## 2011-12-12 DIAGNOSIS — H02839 Dermatochalasis of unspecified eye, unspecified eyelid: Secondary | ICD-10-CM | POA: Insufficient documentation

## 2011-12-12 DIAGNOSIS — H04123 Dry eye syndrome of bilateral lacrimal glands: Secondary | ICD-10-CM | POA: Insufficient documentation

## 2011-12-16 ENCOUNTER — Encounter (HOSPITAL_COMMUNITY): Payer: Self-pay

## 2011-12-18 ENCOUNTER — Encounter (HOSPITAL_COMMUNITY): Payer: Self-pay

## 2011-12-19 ENCOUNTER — Encounter (HOSPITAL_COMMUNITY): Payer: Self-pay

## 2011-12-20 ENCOUNTER — Other Ambulatory Visit: Payer: Self-pay | Admitting: Internal Medicine

## 2011-12-23 ENCOUNTER — Encounter (HOSPITAL_COMMUNITY)
Admission: RE | Admit: 2011-12-23 | Discharge: 2011-12-23 | Disposition: A | Discharge status: Home / Self Care | Payer: Self-pay | Source: Ambulatory Visit | Attending: Cardiovascular Disease | Admitting: Cardiovascular Disease

## 2011-12-23 DIAGNOSIS — I471 Supraventricular tachycardia, unspecified: Secondary | ICD-10-CM | POA: Insufficient documentation

## 2011-12-23 DIAGNOSIS — Z7901 Long term (current) use of anticoagulants: Secondary | ICD-10-CM | POA: Insufficient documentation

## 2011-12-23 DIAGNOSIS — I252 Old myocardial infarction: Secondary | ICD-10-CM | POA: Insufficient documentation

## 2011-12-23 DIAGNOSIS — I10 Essential (primary) hypertension: Secondary | ICD-10-CM | POA: Insufficient documentation

## 2011-12-23 DIAGNOSIS — Z5189 Encounter for other specified aftercare: Secondary | ICD-10-CM | POA: Insufficient documentation

## 2011-12-23 DIAGNOSIS — E785 Hyperlipidemia, unspecified: Secondary | ICD-10-CM | POA: Insufficient documentation

## 2011-12-23 DIAGNOSIS — I5181 Takotsubo syndrome: Secondary | ICD-10-CM | POA: Insufficient documentation

## 2011-12-23 DIAGNOSIS — I059 Rheumatic mitral valve disease, unspecified: Secondary | ICD-10-CM | POA: Insufficient documentation

## 2011-12-25 ENCOUNTER — Encounter (HOSPITAL_COMMUNITY)
Admission: RE | Admit: 2011-12-25 | Discharge: 2011-12-25 | Disposition: A | Discharge status: Home / Self Care | Payer: Self-pay | Source: Ambulatory Visit | Attending: Cardiovascular Disease | Admitting: Cardiovascular Disease

## 2011-12-26 ENCOUNTER — Encounter (HOSPITAL_COMMUNITY)
Admission: RE | Admit: 2011-12-26 | Discharge: 2011-12-26 | Disposition: A | Discharge status: Home / Self Care | Payer: Self-pay | Source: Ambulatory Visit | Attending: Cardiovascular Disease | Admitting: Cardiovascular Disease

## 2011-12-30 ENCOUNTER — Encounter (HOSPITAL_COMMUNITY)
Admission: RE | Admit: 2011-12-30 | Discharge: 2011-12-30 | Disposition: A | Discharge status: Home / Self Care | Payer: Self-pay | Source: Ambulatory Visit | Attending: Cardiovascular Disease | Admitting: Cardiovascular Disease

## 2012-01-01 ENCOUNTER — Encounter (HOSPITAL_COMMUNITY)
Admission: RE | Admit: 2012-01-01 | Discharge: 2012-01-01 | Disposition: A | Discharge status: Home / Self Care | Payer: Self-pay | Source: Ambulatory Visit | Attending: Cardiovascular Disease | Admitting: Cardiovascular Disease

## 2012-01-02 ENCOUNTER — Encounter (HOSPITAL_COMMUNITY)
Admission: RE | Admit: 2012-01-02 | Discharge: 2012-01-02 | Disposition: A | Discharge status: Home / Self Care | Payer: Self-pay | Source: Ambulatory Visit | Attending: Cardiovascular Disease | Admitting: Cardiovascular Disease

## 2012-01-06 ENCOUNTER — Encounter (HOSPITAL_COMMUNITY)
Admission: RE | Admit: 2012-01-06 | Discharge: 2012-01-06 | Disposition: A | Discharge status: Home / Self Care | Payer: Self-pay | Source: Ambulatory Visit | Attending: Cardiovascular Disease | Admitting: Cardiovascular Disease

## 2012-01-07 ENCOUNTER — Encounter: Payer: Self-pay | Admitting: Internal Medicine

## 2012-01-07 ENCOUNTER — Ambulatory Visit (INDEPENDENT_AMBULATORY_CARE_PROVIDER_SITE_OTHER): Payer: Medicare Other | Admitting: Internal Medicine

## 2012-01-07 VITALS — BP 110/70 | HR 80 | Ht 67.0 in | Wt 122.0 lb

## 2012-01-07 DIAGNOSIS — R198 Other specified symptoms and signs involving the digestive system and abdomen: Secondary | ICD-10-CM

## 2012-01-07 DIAGNOSIS — K5100 Ulcerative (chronic) pancolitis without complications: Secondary | ICD-10-CM

## 2012-01-07 MED ORDER — PANTOPRAZOLE SODIUM 40 MG PO TBEC: 40.0000 mg | DELAYED_RELEASE_TABLET | Freq: Every day | ORAL | Status: DC

## 2012-01-07 MED ORDER — BALSALAZIDE DISODIUM 750 MG PO CAPS: ORAL_CAPSULE | ORAL | Status: DC

## 2012-01-07 NOTE — Patient Instructions (Addendum)
We have sent the following medications to your pharmacy for you to pick up at your convenience: colazal protonix Please purchase Metamucil over the counter. Take as directed. CC: Dr Guerry Bruin

## 2012-01-07 NOTE — Progress Notes (Signed)
Kirsten Rice 1946-02-12 MRN [id number]   History of Present Illness:  This is a 66 year old white female with ulcerative colitis of more than 20 years duration. Her last office visit was in June 2012. She was then in remission. She comes now because of change in bowel habits. Her stools are smaller in caliber, narrow and ropy. She denies any rectal bleeding or abdominal pain. She has only 1-2 bowel movements a day. Her medications include: Colazal 750 mg 3 times a day. She has a history of Takotsuba cardiomyopathy and Raynaud disease. Her last colonoscopy in June 2010 showed patchy chronic active colitis.   Past Medical History  Diagnosis Date  . Takotsubo cardiomyopathy 2007  . Raynaud's disease   . Mitral valve prolapse   . Crohn disease   . Deep vein thrombosis   . Telangiectasia   . Hyperthyroidism   . GERD (gastroesophageal reflux disease)   . Cardiomyopathy    Past Surgical History  Procedure Date  . Shoulder surgery 1996    right  . Tubal ligation 1973  . Appendectomy 1971    reports that she has never smoked. She has never used smokeless tobacco. She reports that she drinks alcohol. She reports that she does not use illicit drugs. family history includes Aortic stenosis in her father.  There is no history of Colon cancer. Allergies  Allergen Reactions  . Prochlorperazine Edisylate     REACTION: extrapyrimadal syndrome  . Tetanus-Diphtheria Toxoids Td     REACTION: arm swells        Review of Systems: Denies abdominal pain, heartburn odynophagia or chest pain  The remainder of the 10 point ROS is negative except as outlined in H&P   Physical Exam: General appearance  Well developed, in no distress. Eyes- non icteric. HEENT nontraumatic, normocephalic. Mouth no lesions, tongue papillated, no cheilosis. Neck supple without adenopathy, thyroid not enlarged, no carotid bruits, no JVD. Lungs Clear to auscultation bilaterally. Cor normal S1, normal S2, regular  rhythm, no murmur,  quiet precordium. Abdomen: Soft nontender abdomen with normoactive bowel sounds. Minimal tenderness in left lower quadrant. No palpable mass or stool. Rectal: Soft Hemoccult negative stool. Extremities no pedal edema. Skin no lesions. Neurological alert and oriented x 3. Psychological normal mood and affect.  Assessment and Plan:  Problem #1 Ulcerative colitis of more than 20 years duration. Her last colonoscopy was 3 years ago. She is due for a recall colonoscopy. She is Hemoccult-negative today. The last exam showed chronic patchy colitis. Her current symptoms may be related to low-grade colitis. We will increase her Colazal to 750 mg 2 tablets 3 times a day to total of 4.5 g daily. She will let us know in the next 6-8 weeks how she is doing and then we will decide if she is going to have a colonoscopy now or later this year. I have also given her samples of Metamucil to take to bulk up her stool. 01/07/2012 Kirsten Rice

## 2012-01-08 ENCOUNTER — Encounter (HOSPITAL_COMMUNITY)
Admission: RE | Admit: 2012-01-08 | Discharge: 2012-01-08 | Disposition: A | Discharge status: Home / Self Care | Payer: Self-pay | Source: Ambulatory Visit | Attending: Cardiovascular Disease | Admitting: Cardiovascular Disease

## 2012-01-09 ENCOUNTER — Encounter (HOSPITAL_COMMUNITY)
Admission: RE | Admit: 2012-01-09 | Discharge: 2012-01-09 | Disposition: A | Discharge status: Home / Self Care | Payer: Self-pay | Source: Ambulatory Visit | Attending: Cardiovascular Disease | Admitting: Cardiovascular Disease

## 2012-01-13 ENCOUNTER — Encounter (HOSPITAL_COMMUNITY)
Admission: RE | Admit: 2012-01-13 | Discharge: 2012-01-13 | Disposition: A | Discharge status: Home / Self Care | Payer: Self-pay | Source: Ambulatory Visit | Attending: Cardiovascular Disease | Admitting: Cardiovascular Disease

## 2012-01-15 ENCOUNTER — Encounter (HOSPITAL_COMMUNITY)
Admission: RE | Admit: 2012-01-15 | Discharge: 2012-01-15 | Disposition: A | Discharge status: Home / Self Care | Payer: Self-pay | Source: Ambulatory Visit | Attending: Cardiovascular Disease | Admitting: Cardiovascular Disease

## 2012-01-16 ENCOUNTER — Encounter (HOSPITAL_COMMUNITY)
Admission: RE | Admit: 2012-01-16 | Discharge: 2012-01-16 | Disposition: A | Discharge status: Home / Self Care | Payer: Self-pay | Source: Ambulatory Visit | Attending: Cardiovascular Disease | Admitting: Cardiovascular Disease

## 2012-01-20 ENCOUNTER — Encounter (HOSPITAL_COMMUNITY)
Admission: RE | Admit: 2012-01-20 | Discharge: 2012-01-20 | Disposition: A | Discharge status: Home / Self Care | Payer: Self-pay | Source: Ambulatory Visit | Attending: Cardiovascular Disease | Admitting: Cardiovascular Disease

## 2012-01-20 DIAGNOSIS — E785 Hyperlipidemia, unspecified: Secondary | ICD-10-CM | POA: Insufficient documentation

## 2012-01-20 DIAGNOSIS — I10 Essential (primary) hypertension: Secondary | ICD-10-CM | POA: Insufficient documentation

## 2012-01-20 DIAGNOSIS — I5181 Takotsubo syndrome: Secondary | ICD-10-CM | POA: Insufficient documentation

## 2012-01-20 DIAGNOSIS — Z7901 Long term (current) use of anticoagulants: Secondary | ICD-10-CM | POA: Insufficient documentation

## 2012-01-20 DIAGNOSIS — I471 Supraventricular tachycardia, unspecified: Secondary | ICD-10-CM | POA: Insufficient documentation

## 2012-01-20 DIAGNOSIS — I252 Old myocardial infarction: Secondary | ICD-10-CM | POA: Insufficient documentation

## 2012-01-20 DIAGNOSIS — Z5189 Encounter for other specified aftercare: Secondary | ICD-10-CM | POA: Insufficient documentation

## 2012-01-20 DIAGNOSIS — I059 Rheumatic mitral valve disease, unspecified: Secondary | ICD-10-CM | POA: Insufficient documentation

## 2012-01-22 ENCOUNTER — Encounter (HOSPITAL_COMMUNITY): Payer: Self-pay

## 2012-01-23 ENCOUNTER — Encounter (HOSPITAL_COMMUNITY)
Admission: RE | Admit: 2012-01-23 | Discharge: 2012-01-23 | Disposition: A | Discharge status: Home / Self Care | Payer: Self-pay | Source: Ambulatory Visit | Attending: Cardiovascular Disease | Admitting: Cardiovascular Disease

## 2012-01-27 ENCOUNTER — Encounter (HOSPITAL_COMMUNITY)
Admission: RE | Admit: 2012-01-27 | Discharge: 2012-01-27 | Disposition: A | Discharge status: Home / Self Care | Payer: Self-pay | Source: Ambulatory Visit | Attending: Cardiovascular Disease | Admitting: Cardiovascular Disease

## 2012-01-29 ENCOUNTER — Encounter (HOSPITAL_COMMUNITY)
Admission: RE | Admit: 2012-01-29 | Discharge: 2012-01-29 | Disposition: A | Discharge status: Home / Self Care | Payer: Self-pay | Source: Ambulatory Visit | Attending: Cardiovascular Disease | Admitting: Cardiovascular Disease

## 2012-01-30 ENCOUNTER — Encounter (HOSPITAL_COMMUNITY)
Admission: RE | Admit: 2012-01-30 | Discharge: 2012-01-30 | Disposition: A | Discharge status: Home / Self Care | Payer: Self-pay | Source: Ambulatory Visit | Attending: Cardiovascular Disease | Admitting: Cardiovascular Disease

## 2012-02-03 ENCOUNTER — Encounter (HOSPITAL_COMMUNITY): Payer: Self-pay

## 2012-02-05 ENCOUNTER — Encounter (HOSPITAL_COMMUNITY)
Admission: RE | Admit: 2012-02-05 | Discharge: 2012-02-05 | Disposition: A | Discharge status: Home / Self Care | Payer: Self-pay | Source: Ambulatory Visit | Attending: Cardiovascular Disease | Admitting: Cardiovascular Disease

## 2012-02-06 ENCOUNTER — Encounter (HOSPITAL_COMMUNITY)
Admission: RE | Admit: 2012-02-06 | Discharge: 2012-02-06 | Disposition: A | Discharge status: Home / Self Care | Payer: Self-pay | Source: Ambulatory Visit | Attending: Cardiovascular Disease | Admitting: Cardiovascular Disease

## 2012-02-10 ENCOUNTER — Encounter (HOSPITAL_COMMUNITY)
Admission: RE | Admit: 2012-02-10 | Discharge: 2012-02-10 | Disposition: A | Discharge status: Home / Self Care | Payer: Self-pay | Source: Ambulatory Visit | Attending: Cardiovascular Disease | Admitting: Cardiovascular Disease

## 2012-02-12 ENCOUNTER — Encounter (HOSPITAL_COMMUNITY)
Admission: RE | Admit: 2012-02-12 | Discharge: 2012-02-12 | Disposition: A | Discharge status: Home / Self Care | Payer: Self-pay | Source: Ambulatory Visit | Attending: Cardiovascular Disease | Admitting: Cardiovascular Disease

## 2012-02-13 ENCOUNTER — Encounter (HOSPITAL_COMMUNITY)
Admission: RE | Admit: 2012-02-13 | Discharge: 2012-02-13 | Disposition: A | Discharge status: Home / Self Care | Payer: Self-pay | Source: Ambulatory Visit | Attending: Cardiovascular Disease | Admitting: Cardiovascular Disease

## 2012-02-17 ENCOUNTER — Encounter (HOSPITAL_COMMUNITY)
Admission: RE | Admit: 2012-02-17 | Discharge: 2012-02-17 | Disposition: A | Discharge status: Home / Self Care | Payer: Self-pay | Source: Ambulatory Visit | Attending: Cardiovascular Disease | Admitting: Cardiovascular Disease

## 2012-02-19 ENCOUNTER — Encounter (HOSPITAL_COMMUNITY)
Admission: RE | Admit: 2012-02-19 | Discharge: 2012-02-19 | Disposition: A | Discharge status: Home / Self Care | Payer: Self-pay | Source: Ambulatory Visit | Attending: Cardiovascular Disease | Admitting: Cardiovascular Disease

## 2012-02-19 DIAGNOSIS — I471 Supraventricular tachycardia, unspecified: Secondary | ICD-10-CM | POA: Insufficient documentation

## 2012-02-19 DIAGNOSIS — I5181 Takotsubo syndrome: Secondary | ICD-10-CM | POA: Insufficient documentation

## 2012-02-19 DIAGNOSIS — I10 Essential (primary) hypertension: Secondary | ICD-10-CM | POA: Insufficient documentation

## 2012-02-19 DIAGNOSIS — I252 Old myocardial infarction: Secondary | ICD-10-CM | POA: Insufficient documentation

## 2012-02-19 DIAGNOSIS — I059 Rheumatic mitral valve disease, unspecified: Secondary | ICD-10-CM | POA: Insufficient documentation

## 2012-02-19 DIAGNOSIS — E785 Hyperlipidemia, unspecified: Secondary | ICD-10-CM | POA: Insufficient documentation

## 2012-02-19 DIAGNOSIS — Z5189 Encounter for other specified aftercare: Secondary | ICD-10-CM | POA: Insufficient documentation

## 2012-02-20 ENCOUNTER — Encounter (HOSPITAL_COMMUNITY)
Admission: RE | Admit: 2012-02-20 | Discharge: 2012-02-20 | Disposition: A | Discharge status: Home / Self Care | Payer: Self-pay | Source: Ambulatory Visit | Attending: Cardiovascular Disease | Admitting: Cardiovascular Disease

## 2012-02-24 ENCOUNTER — Encounter (HOSPITAL_COMMUNITY)
Admission: RE | Admit: 2012-02-24 | Discharge: 2012-02-24 | Disposition: A | Discharge status: Home / Self Care | Payer: Self-pay | Source: Ambulatory Visit | Attending: Cardiovascular Disease | Admitting: Cardiovascular Disease

## 2012-02-26 ENCOUNTER — Encounter (HOSPITAL_COMMUNITY)
Admission: RE | Admit: 2012-02-26 | Discharge: 2012-02-26 | Disposition: A | Discharge status: Home / Self Care | Payer: Self-pay | Source: Ambulatory Visit | Attending: Cardiovascular Disease | Admitting: Cardiovascular Disease

## 2012-02-27 ENCOUNTER — Encounter (HOSPITAL_COMMUNITY)
Admission: RE | Admit: 2012-02-27 | Discharge: 2012-02-27 | Disposition: A | Discharge status: Home / Self Care | Payer: Self-pay | Source: Ambulatory Visit | Attending: Cardiovascular Disease | Admitting: Cardiovascular Disease

## 2012-03-02 ENCOUNTER — Other Ambulatory Visit: Payer: Self-pay | Admitting: Obstetrics and Gynecology

## 2012-03-02 ENCOUNTER — Encounter (HOSPITAL_COMMUNITY)
Admission: RE | Admit: 2012-03-02 | Discharge: 2012-03-02 | Disposition: A | Discharge status: Home / Self Care | Payer: Self-pay | Source: Ambulatory Visit | Attending: Cardiovascular Disease | Admitting: Cardiovascular Disease

## 2012-03-03 ENCOUNTER — Telehealth: Payer: Self-pay | Admitting: Internal Medicine

## 2012-03-03 DIAGNOSIS — K5190 Ulcerative colitis, unspecified, without complications: Secondary | ICD-10-CM

## 2012-03-03 NOTE — Telephone Encounter (Signed)
Patient had OV in June. Dr. Juanda Chance increased her Colazal to 6 tab/ day to see if it would help the small stools. Patient calling to update that she still is having small finger size stools and feels like she needs to go to bathroom but cannot. Seems that the Colazal is more constipating. She did use Metamucil also. She is wondering if she should go ahead and schedule a colonoscopy. Please, advise.

## 2012-03-03 NOTE — Telephone Encounter (Signed)
Last colon 12/2008, she has had UC for over 20 years, so it is appropriate to do colonoscopy. She is on ASA, I don't see Plavix but ask her, just to make sure. Also, she may decrease the Colazal to 1 tid.

## 2012-03-04 ENCOUNTER — Encounter (HOSPITAL_COMMUNITY)
Admission: RE | Admit: 2012-03-04 | Discharge: 2012-03-04 | Disposition: A | Discharge status: Home / Self Care | Payer: Self-pay | Source: Ambulatory Visit | Attending: Cardiovascular Disease | Admitting: Cardiovascular Disease

## 2012-03-04 NOTE — Telephone Encounter (Signed)
Next hospital week.But tell her, I would prefer [facility] because I can use Propofol which would make it so much easier on her in terms of sedation.Aske her why she wants it at [facility]. ?? Insurance?

## 2012-03-04 NOTE — Telephone Encounter (Signed)
Patient just prefers hospital . She will out of town Sept 6-23. Need to schedule around this.

## 2012-03-04 NOTE — Telephone Encounter (Signed)
Spoke with patient and gave her Dr. Regino Schultze recommendations. She would like to have the colonoscopy at [facility] endo. Dr. Juanda Chance, do you want me to schedule it on your next hospital week? Please, advise.

## 2012-03-05 ENCOUNTER — Other Ambulatory Visit: Payer: Self-pay | Admitting: *Deleted

## 2012-03-05 ENCOUNTER — Encounter (HOSPITAL_COMMUNITY)
Admission: RE | Admit: 2012-03-05 | Discharge: 2012-03-05 | Disposition: A | Discharge status: Home / Self Care | Payer: Self-pay | Source: Ambulatory Visit | Attending: Cardiovascular Disease | Admitting: Cardiovascular Disease

## 2012-03-05 DIAGNOSIS — K5190 Ulcerative colitis, unspecified, without complications: Secondary | ICD-10-CM

## 2012-03-05 NOTE — Telephone Encounter (Signed)
Spoke with patient and gave her colonoscopy date/time. Scheduled pre visit on 05/04/12 at 10:00 AM.

## 2012-03-05 NOTE — Telephone Encounter (Signed)
Scheduled Colonoscopy at [facility] endo on 05/11/12 at 8:30 AM. Booking number- [id number](stephanie) Left a message for patient to call me.

## 2012-03-09 ENCOUNTER — Encounter (HOSPITAL_COMMUNITY): Payer: Self-pay

## 2012-03-11 ENCOUNTER — Encounter (HOSPITAL_COMMUNITY): Payer: Self-pay

## 2012-03-12 ENCOUNTER — Encounter (HOSPITAL_COMMUNITY)
Admission: RE | Admit: 2012-03-12 | Discharge: 2012-03-12 | Disposition: A | Discharge status: Home / Self Care | Payer: Self-pay | Source: Ambulatory Visit | Attending: Cardiovascular Disease | Admitting: Cardiovascular Disease

## 2012-03-16 ENCOUNTER — Encounter (HOSPITAL_COMMUNITY)
Admission: RE | Admit: 2012-03-16 | Discharge: 2012-03-16 | Disposition: A | Discharge status: Home / Self Care | Payer: Self-pay | Source: Ambulatory Visit | Attending: Cardiovascular Disease | Admitting: Cardiovascular Disease

## 2012-03-18 ENCOUNTER — Encounter (HOSPITAL_COMMUNITY)
Admission: RE | Admit: 2012-03-18 | Discharge: 2012-03-18 | Disposition: A | Discharge status: Home / Self Care | Payer: Self-pay | Source: Ambulatory Visit | Attending: Cardiovascular Disease | Admitting: Cardiovascular Disease

## 2012-03-19 ENCOUNTER — Encounter (HOSPITAL_COMMUNITY)
Admission: RE | Admit: 2012-03-19 | Discharge: 2012-03-19 | Disposition: A | Discharge status: Home / Self Care | Payer: Self-pay | Source: Ambulatory Visit | Attending: Cardiovascular Disease | Admitting: Cardiovascular Disease

## 2012-03-23 ENCOUNTER — Encounter (HOSPITAL_COMMUNITY)
Admission: RE | Admit: 2012-03-23 | Discharge: 2012-03-23 | Disposition: A | Discharge status: Home / Self Care | Payer: Self-pay | Source: Ambulatory Visit | Attending: Cardiovascular Disease | Admitting: Cardiovascular Disease

## 2012-03-23 DIAGNOSIS — I10 Essential (primary) hypertension: Secondary | ICD-10-CM | POA: Insufficient documentation

## 2012-03-23 DIAGNOSIS — I471 Supraventricular tachycardia, unspecified: Secondary | ICD-10-CM | POA: Insufficient documentation

## 2012-03-23 DIAGNOSIS — I059 Rheumatic mitral valve disease, unspecified: Secondary | ICD-10-CM | POA: Insufficient documentation

## 2012-03-23 DIAGNOSIS — Z7901 Long term (current) use of anticoagulants: Secondary | ICD-10-CM | POA: Insufficient documentation

## 2012-03-23 DIAGNOSIS — I5181 Takotsubo syndrome: Secondary | ICD-10-CM | POA: Insufficient documentation

## 2012-03-23 DIAGNOSIS — E785 Hyperlipidemia, unspecified: Secondary | ICD-10-CM | POA: Insufficient documentation

## 2012-03-23 DIAGNOSIS — I252 Old myocardial infarction: Secondary | ICD-10-CM | POA: Insufficient documentation

## 2012-03-23 DIAGNOSIS — Z5189 Encounter for other specified aftercare: Secondary | ICD-10-CM | POA: Insufficient documentation

## 2012-03-25 ENCOUNTER — Encounter (HOSPITAL_COMMUNITY)
Admission: RE | Admit: 2012-03-25 | Discharge: 2012-03-25 | Disposition: A | Discharge status: Home / Self Care | Payer: Self-pay | Source: Ambulatory Visit | Attending: Cardiovascular Disease | Admitting: Cardiovascular Disease

## 2012-03-26 ENCOUNTER — Encounter (HOSPITAL_COMMUNITY)
Admission: RE | Admit: 2012-03-26 | Discharge: 2012-03-26 | Disposition: A | Discharge status: Home / Self Care | Payer: Self-pay | Source: Ambulatory Visit | Attending: Cardiovascular Disease | Admitting: Cardiovascular Disease

## 2012-03-30 ENCOUNTER — Encounter (HOSPITAL_COMMUNITY): Payer: Self-pay

## 2012-04-01 ENCOUNTER — Encounter (HOSPITAL_COMMUNITY): Payer: Self-pay

## 2012-04-02 ENCOUNTER — Encounter (HOSPITAL_COMMUNITY): Payer: Self-pay

## 2012-04-06 ENCOUNTER — Encounter (HOSPITAL_COMMUNITY): Payer: Self-pay

## 2012-04-08 ENCOUNTER — Other Ambulatory Visit: Payer: Self-pay | Admitting: Cardiovascular Disease

## 2012-04-08 ENCOUNTER — Encounter (HOSPITAL_COMMUNITY)
Admission: RE | Admit: 2012-04-08 | Discharge: 2012-04-08 | Disposition: A | Discharge status: Home / Self Care | Payer: Self-pay | Source: Ambulatory Visit | Attending: Cardiovascular Disease | Admitting: Cardiovascular Disease

## 2012-04-08 NOTE — Telephone Encounter (Signed)
Refilled furosemide

## 2012-04-09 ENCOUNTER — Encounter (HOSPITAL_COMMUNITY)
Admission: RE | Admit: 2012-04-09 | Discharge: 2012-04-09 | Disposition: A | Discharge status: Home / Self Care | Payer: Self-pay | Source: Ambulatory Visit | Attending: Cardiovascular Disease | Admitting: Cardiovascular Disease

## 2012-04-09 DIAGNOSIS — Z283 Underimmunization status: Secondary | ICD-10-CM | POA: Insufficient documentation

## 2012-04-13 ENCOUNTER — Encounter (HOSPITAL_COMMUNITY): Payer: Self-pay

## 2012-04-15 ENCOUNTER — Encounter (HOSPITAL_COMMUNITY)
Admission: RE | Admit: 2012-04-15 | Discharge: 2012-04-15 | Disposition: A | Discharge status: Home / Self Care | Payer: Self-pay | Source: Ambulatory Visit | Attending: Cardiovascular Disease | Admitting: Cardiovascular Disease

## 2012-04-16 ENCOUNTER — Encounter (HOSPITAL_COMMUNITY)
Admission: RE | Admit: 2012-04-16 | Discharge: 2012-04-16 | Disposition: A | Discharge status: Home / Self Care | Payer: Self-pay | Source: Ambulatory Visit | Attending: Cardiovascular Disease | Admitting: Cardiovascular Disease

## 2012-04-20 ENCOUNTER — Encounter (HOSPITAL_COMMUNITY)
Admission: RE | Admit: 2012-04-20 | Discharge: 2012-04-20 | Disposition: A | Discharge status: Home / Self Care | Payer: Self-pay | Source: Ambulatory Visit | Attending: Cardiovascular Disease | Admitting: Cardiovascular Disease

## 2012-04-20 DIAGNOSIS — I5181 Takotsubo syndrome: Secondary | ICD-10-CM | POA: Insufficient documentation

## 2012-04-20 DIAGNOSIS — I10 Essential (primary) hypertension: Secondary | ICD-10-CM | POA: Insufficient documentation

## 2012-04-20 DIAGNOSIS — I252 Old myocardial infarction: Secondary | ICD-10-CM | POA: Insufficient documentation

## 2012-04-20 DIAGNOSIS — E785 Hyperlipidemia, unspecified: Secondary | ICD-10-CM | POA: Insufficient documentation

## 2012-04-20 DIAGNOSIS — I059 Rheumatic mitral valve disease, unspecified: Secondary | ICD-10-CM | POA: Insufficient documentation

## 2012-04-20 DIAGNOSIS — Z7901 Long term (current) use of anticoagulants: Secondary | ICD-10-CM | POA: Insufficient documentation

## 2012-04-20 DIAGNOSIS — I471 Supraventricular tachycardia, unspecified: Secondary | ICD-10-CM | POA: Insufficient documentation

## 2012-04-20 DIAGNOSIS — Z5189 Encounter for other specified aftercare: Secondary | ICD-10-CM | POA: Insufficient documentation

## 2012-04-22 ENCOUNTER — Encounter (HOSPITAL_COMMUNITY): Payer: Self-pay

## 2012-04-22 DIAGNOSIS — H01009 Unspecified blepharitis unspecified eye, unspecified eyelid: Secondary | ICD-10-CM | POA: Insufficient documentation

## 2012-04-23 ENCOUNTER — Encounter (HOSPITAL_COMMUNITY)
Admission: RE | Admit: 2012-04-23 | Discharge: 2012-04-23 | Disposition: A | Discharge status: Home / Self Care | Payer: Self-pay | Source: Ambulatory Visit | Attending: Cardiovascular Disease | Admitting: Cardiovascular Disease

## 2012-04-27 ENCOUNTER — Encounter (HOSPITAL_COMMUNITY)
Admission: RE | Admit: 2012-04-27 | Discharge: 2012-04-27 | Disposition: A | Discharge status: Home / Self Care | Payer: Self-pay | Source: Ambulatory Visit | Attending: Cardiovascular Disease | Admitting: Cardiovascular Disease

## 2012-04-29 ENCOUNTER — Encounter (HOSPITAL_COMMUNITY)
Admission: RE | Admit: 2012-04-29 | Discharge: 2012-04-29 | Disposition: A | Discharge status: Home / Self Care | Payer: Self-pay | Source: Ambulatory Visit | Attending: Cardiovascular Disease | Admitting: Cardiovascular Disease

## 2012-04-30 ENCOUNTER — Encounter (HOSPITAL_COMMUNITY): Payer: Self-pay

## 2012-05-04 ENCOUNTER — Encounter (HOSPITAL_COMMUNITY)
Admission: RE | Admit: 2012-05-04 | Discharge: 2012-05-04 | Disposition: A | Discharge status: Home / Self Care | Payer: Self-pay | Source: Ambulatory Visit | Attending: Cardiovascular Disease | Admitting: Cardiovascular Disease

## 2012-05-04 ENCOUNTER — Ambulatory Visit (AMBULATORY_SURGERY_CENTER): Payer: Medicare Other

## 2012-05-04 ENCOUNTER — Encounter: Payer: Self-pay | Admitting: Internal Medicine

## 2012-05-04 VITALS — Ht 67.5 in | Wt 123.7 lb

## 2012-05-04 DIAGNOSIS — K5120 Ulcerative (chronic) proctitis without complications: Secondary | ICD-10-CM

## 2012-05-04 DIAGNOSIS — Z1211 Encounter for screening for malignant neoplasm of colon: Secondary | ICD-10-CM

## 2012-05-04 MED ORDER — MOVIPREP 100 G PO SOLR: ORAL | Status: DC

## 2012-05-06 ENCOUNTER — Encounter (HOSPITAL_COMMUNITY)
Admission: RE | Admit: 2012-05-06 | Discharge: 2012-05-06 | Disposition: A | Discharge status: Home / Self Care | Payer: Self-pay | Source: Ambulatory Visit | Attending: Cardiovascular Disease | Admitting: Cardiovascular Disease

## 2012-05-07 ENCOUNTER — Encounter (HOSPITAL_COMMUNITY): Payer: Self-pay

## 2012-05-10 NOTE — H&P (Signed)
  Kirsten Rice 1946/02/25 MRN [id number]        History of Present Illness:  This is a 66 yo WF undergoing colonoscopy for follow up of Ulcerative colitis of greater than 20 years duration. Last colonoscopy in June 41324 showed chronic active colitis. She has had a change of the bowl habits. We have increased her Colazal to 750 mg, 2 po tid.    Past Medical History  Diagnosis Date  . Takotsubo cardiomyopathy 2007  . Raynaud's disease   . Mitral valve prolapse   . Crohn disease   . Deep vein thrombosis   . Telangiectasia   . Hyperthyroidism   . GERD (gastroesophageal reflux disease)   . Cardiomyopathy   . Myocardial infarction    Past Surgical History  Procedure Date  . Shoulder surgery 1996    right  . Tubal ligation 1973  . Appendectomy 1971  . Colonoscopy     reports that she has never smoked. She has never used smokeless tobacco. She reports that she drinks about 4.2 ounces of alcohol per week. She reports that she does not use illicit drugs. family history includes Aortic stenosis in her father and Diabetes in her sisters.  There is no history of Colon cancer. Allergies  Allergen Reactions  . Compazine (Prochlorperazine Edisylate) Other (See Comments)    paralysis of face  . Prochlorperazine Edisylate     REACTION: extrapyrimadal syndrome  . Tetanus-Diphtheria Toxoids Td     REACTION: arm swells        Review of Systems:  The remainder of the 10 point ROS is negative except as outlined in H&P   Physical Exam: General appearance  Well developed, in no distress. Eyes- non icteric. HEENT nontraumatic, normocephalic. Mouth no lesions, tongue papillated, no cheilosis. Neck supple without adenopathy, thyroid not enlarged, no carotid bruits, no JVD. Lungs Clear to auscultation bilaterally. Cor normal S1, normal S2, regular rhythm, no murmur,  quiet precordium. Abdomen: tender LLQ. Active bowl sounds Rectal:deferred Extremities no pedal edema. Skin no  lesions. Neurological alert and oriented x 3. Psychological normal mood and affect.  Assessment and Plan:  U.C x 20 + years, change in bowl habits. Pt is due for follow up colonoscopy to r/o dysplasia.   05/10/2012 [name]

## 2012-05-10 NOTE — Interval H&P Note (Signed)
History and Physical Interval Note:  05/10/2012 10:09 PM  Kirsten Rice  has presented today for surgery, with the diagnosis of Ulcerative colitis  The various methods of treatment have been discussed with the patient and family. After consideration of risks, benefits and other options for treatment, the patient has consented to  Procedure(s) (LRB) with comments: COLONOSCOPY (N/A) as a surgical intervention .  The patient's history has been reviewed, patient examined, no change in status, stable for surgery.  I have reviewed the patient's chart and labs.  Questions were answered to the patient's satisfaction.     [name]

## 2012-05-11 ENCOUNTER — Encounter (HOSPITAL_COMMUNITY): Payer: Self-pay

## 2012-05-11 ENCOUNTER — Ambulatory Visit (HOSPITAL_COMMUNITY)
Admission: RE | Admit: 2012-05-11 | Discharge: 2012-05-11 | Disposition: A | Discharge status: Home / Self Care | Payer: Medicare Other | Source: Ambulatory Visit | Attending: Internal Medicine | Admitting: Internal Medicine

## 2012-05-11 ENCOUNTER — Encounter (HOSPITAL_COMMUNITY)
Admission: RE | Disposition: A | Discharge status: Home / Self Care | Payer: Self-pay | Source: Ambulatory Visit | Attending: Internal Medicine

## 2012-05-11 DIAGNOSIS — I252 Old myocardial infarction: Secondary | ICD-10-CM | POA: Insufficient documentation

## 2012-05-11 DIAGNOSIS — K5190 Ulcerative colitis, unspecified, without complications: Secondary | ICD-10-CM

## 2012-05-11 DIAGNOSIS — I7300 Raynaud's syndrome without gangrene: Secondary | ICD-10-CM | POA: Insufficient documentation

## 2012-05-11 DIAGNOSIS — Z86718 Personal history of other venous thrombosis and embolism: Secondary | ICD-10-CM | POA: Insufficient documentation

## 2012-05-11 DIAGNOSIS — K5100 Ulcerative (chronic) pancolitis without complications: Secondary | ICD-10-CM | POA: Insufficient documentation

## 2012-05-11 DIAGNOSIS — K219 Gastro-esophageal reflux disease without esophagitis: Secondary | ICD-10-CM | POA: Insufficient documentation

## 2012-05-11 DIAGNOSIS — E0590 Thyrotoxicosis, unspecified without thyrotoxic crisis or storm: Secondary | ICD-10-CM | POA: Insufficient documentation

## 2012-05-11 HISTORY — PX: COLONOSCOPY: SHX5424

## 2012-05-11 SURGERY — COLONOSCOPY
Anesthesia: Moderate Sedation

## 2012-05-11 MED ORDER — MESALAMINE 1000 MG RE SUPP: 1000.0000 mg | Freq: Every day | RECTAL | Status: DC

## 2012-05-11 MED ORDER — MIDAZOLAM HCL 10 MG/2ML IJ SOLN
INTRAMUSCULAR | Status: AC
  Filled 2012-05-11: qty 2

## 2012-05-11 MED ORDER — FENTANYL CITRATE 0.05 MG/ML IJ SOLN
INTRAMUSCULAR | Status: AC
  Filled 2012-05-11: qty 2

## 2012-05-11 MED ORDER — SODIUM CHLORIDE 0.9 % IV SOLN
INTRAVENOUS | Status: DC
  Administered 2012-05-11: 500 mL via INTRAVENOUS

## 2012-05-11 MED ORDER — FENTANYL CITRATE 0.05 MG/ML IJ SOLN
INTRAMUSCULAR | Status: DC | PRN
  Administered 2012-05-11 (×5): 25 ug via INTRAVENOUS

## 2012-05-11 MED ORDER — MIDAZOLAM HCL 5 MG/5ML IJ SOLN
INTRAMUSCULAR | Status: DC | PRN
  Administered 2012-05-11: 1 mg via INTRAVENOUS
  Administered 2012-05-11 (×4): 2 mg via INTRAVENOUS
  Administered 2012-05-11: 1 mg via INTRAVENOUS
  Administered 2012-05-11: 2 mg via INTRAVENOUS

## 2012-05-11 NOTE — Op Note (Signed)
[facility] [address] [place], [address]   COLONOSCOPY PROCEDURE REPORT  PATIENT: Kirsten Rice, Kirsten Rice  MR#: [id number] BIRTHDATE: Jul 26, 1945 , 66  yrs. old GENDER: Female ENDOSCOPIST: Hart Carwin, MD REFERRED BY:  Guerry Bruin, M.D. PROCEDURE DATE:  05/11/2012 PROCEDURE:   Colonoscopy with biopsy ASA CLASS:   Class II INDICATIONS:ulcerative colitis of > 20 years, change in bowl habits, last colon 12/2008 showed chronic active colitis. MEDICATIONS: These medications were titrated to patient response per physician's verbal order, Fentanyl-Detailed 125 mcg IV, and Versed-Detailed 12 mg IV  DESCRIPTION OF PROCEDURE:   After the risks and benefits and of the procedure were explained, informed consent was obtained.  A digital rectal exam revealed no abnormalities of the rectum.    The [id number])  endoscope was introduced through the anus and advanced to the cecum, which was identified by both the appendix and ileocecal valve .  The quality of the prep was good, using MoviPrep .  The instrument was then slowly withdrawn as the colon was fully examined.     COLON FINDINGS: Decreased hastral folds throughout the colon, patchy erythema and increased fibrosis left and the right colon, multiple biopsiea were taken from right colon 120-80 cm, left colon 30- 80 cm and rectosigmoid 0-20 cm.     Retroflexed views revealed no abnormalities.     The scope was then withdrawn from the patient and the procedure completed.  COMPLICATIONS: There were no complications. ENDOSCOPIC IMPRESSION: Decreased hastral folds throughout the colon, patchy erythema and increased fibrosis left and the right colon, multiple biopsiea were taken from right colon 120-80 cm, left colon 30- 80 cm and rectosigmoid 0-20 cm - consistent with chronic ulcerative poancolitis, r/o dysplasia  RECOMMENDATIONS: Await biopsy results increase Colazal to 750 mg 2 po tid, Canasa supp 1059m g  hs  REPEAT EXAM: In 2-3 year(s)  for Colonoscopy.  cc:  _______________________________ eSignedHart Carwin, MD 05/11/2012 9:27 AM     PATIENT NAME:  Kirsten Rice, Kirsten Rice MR#: [id number]

## 2012-05-12 ENCOUNTER — Encounter (HOSPITAL_COMMUNITY): Payer: Self-pay | Admitting: Internal Medicine

## 2012-05-12 ENCOUNTER — Telehealth: Payer: Self-pay | Admitting: Internal Medicine

## 2012-05-12 ENCOUNTER — Encounter (HOSPITAL_COMMUNITY): Payer: Self-pay

## 2012-05-12 NOTE — Telephone Encounter (Signed)
Spoke with patient and explained that we are waiting on her path results and when they are reviewed by Dr. Juanda Chance she will get a letter stating the findings and recommendations. She states she did have some "stringy bright, red blood from her rectum yesterday but has not had any today. She also reports the right side of her abdomen is sore today.

## 2012-05-12 NOTE — Telephone Encounter (Signed)
Biopsies showed diffuse colitis, stay on Colazal 750 2 po tid, OV  First available to discuss other options for treatment.

## 2012-05-13 ENCOUNTER — Encounter (HOSPITAL_COMMUNITY)
Admission: RE | Admit: 2012-05-13 | Discharge: 2012-05-13 | Disposition: A | Discharge status: Home / Self Care | Payer: Self-pay | Source: Ambulatory Visit | Attending: Cardiovascular Disease | Admitting: Cardiovascular Disease

## 2012-05-13 NOTE — Telephone Encounter (Signed)
Patient aware.  I have sent note to Dr. Juanda Chance on bx result note.  See this for additional information

## 2012-05-14 ENCOUNTER — Encounter (HOSPITAL_COMMUNITY)
Admission: RE | Admit: 2012-05-14 | Discharge: 2012-05-14 | Disposition: A | Discharge status: Home / Self Care | Payer: Self-pay | Source: Ambulatory Visit | Attending: Cardiovascular Disease | Admitting: Cardiovascular Disease

## 2012-05-17 ENCOUNTER — Encounter: Payer: Self-pay | Admitting: *Deleted

## 2012-05-17 ENCOUNTER — Ambulatory Visit (INDEPENDENT_AMBULATORY_CARE_PROVIDER_SITE_OTHER): Payer: Medicare Other | Admitting: Internal Medicine

## 2012-05-17 VITALS — BP 134/80 | HR 72 | Ht 67.5 in | Wt 122.4 lb

## 2012-05-17 DIAGNOSIS — L719 Rosacea, unspecified: Secondary | ICD-10-CM | POA: Insufficient documentation

## 2012-05-17 DIAGNOSIS — I252 Old myocardial infarction: Secondary | ICD-10-CM | POA: Insufficient documentation

## 2012-05-17 DIAGNOSIS — K5010 Crohn's disease of large intestine without complications: Secondary | ICD-10-CM

## 2012-05-17 MED ORDER — BALSALAZIDE DISODIUM 750 MG PO CAPS: 1500.0000 mg | ORAL_CAPSULE | Freq: Three times a day (TID) | ORAL | Status: DC

## 2012-05-17 NOTE — Progress Notes (Signed)
Kirsten Rice [birth year] MRN [id number]   History of Present Illness:  This is a 66 year old white female with inflammatory bowel disease treated as ulcerative colitis. She had a colonoscopy last week which showed minimally active chronic colitis throughout the colon. There were granulomas. It indicates that we may be dealing with Crohn's disease. She denies diarrhea but has had some rectal symptoms of poor evacuation and change in the caliber of stools. Her colonoscopy showed a decreased haustral pattern due to  chronic colitis. There was some fibrosis but no obstruction. She was started on Colazal 750 mg 2 tablets 3 times a day and Canasa suppositories 1,000 mcg at bedtime. After the colonoscopy, she developed soreness in the right lower quadrant which has by now decreased considerably.   Past Medical History  Diagnosis Date  . Takotsubo cardiomyopathy 2007  . Raynaud's disease   . Mitral valve prolapse   . Ulcerative colitis   . Deep vein thrombosis   . Telangiectasia   . Hyperthyroidism   . GERD (gastroesophageal reflux disease)   . Cardiomyopathy   . Myocardial infarction    Past Surgical History  Procedure Date  . Shoulder surgery 1996    right  . Tubal ligation 1973  . Appendectomy 1971  . Colonoscopy   . Colonoscopy 05/11/2012    Procedure: COLONOSCOPY;  Surgeon: Hart Carwin, MD;  Location: [facility] ENDOSCOPY;  Service: Endoscopy;  Laterality: N/A;    reports that she has never smoked. She has never used smokeless tobacco. She reports that she drinks about 4.2 ounces of alcohol per week. She reports that she does not use illicit drugs. family history includes Aortic stenosis in her father and Diabetes in her sisters.  There is no history of Colon cancer. Allergies  Allergen Reactions  . Compazine (Prochlorperazine Edisylate) Other (See Comments)    paralysis of face  . Prochlorperazine Edisylate     REACTION: extrapyrimadal syndrome  . Tetanus-Diphtheria Toxoids Td    REACTION: arm swells        Review of Systems: Denies nausea vomiting fever or rectal bleeding  The remainder of the 10 point ROS is negative except as outlined in H&P   Physical Exam: General appearance  thin, in no distress. Eyes- non icteric. HEENT nontraumatic, normocephalic. Mouth no lesions, tongue papillated, no cheilosis. Neck supple without adenopathy, thyroid not enlarged, no carotid bruits, no JVD. Lungs Clear to auscultation bilaterally. Cor normal S1, normal S2, regular rhythm, no murmur,  quiet precordium. Abdomen: Scaphoid soft abdomen with normoactive bowel sounds. Mild tenderness rectal quadrant. No palpable mass. Rectal: Not repeated. Extremities no pedal edema. Skin no lesions. Neurological alert and oriented x 3. Psychological normal mood and affect.  Assessment and Plan:  Problem #1 Inflammatory bowel disease, most likely Crohn's colitis. Up to this point, we were thinking that she has ulcerative colitis. The treatment is the same. She will remain on Colazal 750 mg 2 tablets 3 times a day and Canasa suppositories. She will add fiber to her diet to improve caliber of the stools. I gave her samples of a probiotic to take on an as necessary basis. Her next colonoscopy will be due in 5 years. We have discussed the possibility of biologicals and immunomodulators but I am not in favor of them at this point since her disease has not been severe enough.   05/17/2012 Kirsten Rice

## 2012-05-17 NOTE — Patient Instructions (Addendum)
We have sent the following medications to your pharmacy for you to pick up at your convenience: Colozal  CC: Dr Guerry Bruin

## 2012-05-18 ENCOUNTER — Encounter (HOSPITAL_COMMUNITY)
Admission: RE | Admit: 2012-05-18 | Discharge: 2012-05-18 | Disposition: A | Discharge status: Home / Self Care | Payer: Self-pay | Source: Ambulatory Visit | Attending: Cardiovascular Disease | Admitting: Cardiovascular Disease

## 2012-05-20 ENCOUNTER — Encounter (HOSPITAL_COMMUNITY)
Admission: RE | Admit: 2012-05-20 | Discharge: 2012-05-20 | Disposition: A | Discharge status: Home / Self Care | Payer: Self-pay | Source: Ambulatory Visit | Attending: Cardiovascular Disease | Admitting: Cardiovascular Disease

## 2012-05-21 ENCOUNTER — Encounter (HOSPITAL_COMMUNITY)
Admission: RE | Admit: 2012-05-21 | Discharge: 2012-05-21 | Disposition: A | Discharge status: Home / Self Care | Payer: Self-pay | Source: Ambulatory Visit | Attending: Cardiovascular Disease | Admitting: Cardiovascular Disease

## 2012-05-21 DIAGNOSIS — I10 Essential (primary) hypertension: Secondary | ICD-10-CM | POA: Insufficient documentation

## 2012-05-21 DIAGNOSIS — I471 Supraventricular tachycardia, unspecified: Secondary | ICD-10-CM | POA: Insufficient documentation

## 2012-05-21 DIAGNOSIS — Z7901 Long term (current) use of anticoagulants: Secondary | ICD-10-CM | POA: Insufficient documentation

## 2012-05-21 DIAGNOSIS — E785 Hyperlipidemia, unspecified: Secondary | ICD-10-CM | POA: Insufficient documentation

## 2012-05-21 DIAGNOSIS — I5181 Takotsubo syndrome: Secondary | ICD-10-CM | POA: Insufficient documentation

## 2012-05-21 DIAGNOSIS — I059 Rheumatic mitral valve disease, unspecified: Secondary | ICD-10-CM | POA: Insufficient documentation

## 2012-05-21 DIAGNOSIS — Z5189 Encounter for other specified aftercare: Secondary | ICD-10-CM | POA: Insufficient documentation

## 2012-05-21 DIAGNOSIS — I252 Old myocardial infarction: Secondary | ICD-10-CM | POA: Insufficient documentation

## 2012-05-25 ENCOUNTER — Ambulatory Visit: Payer: Medicare Other | Admitting: Cardiovascular Disease

## 2012-05-25 ENCOUNTER — Encounter (HOSPITAL_COMMUNITY)
Admission: RE | Admit: 2012-05-25 | Discharge: 2012-05-25 | Disposition: A | Discharge status: Home / Self Care | Payer: Self-pay | Source: Ambulatory Visit | Attending: Cardiovascular Disease | Admitting: Cardiovascular Disease

## 2012-05-27 ENCOUNTER — Encounter (HOSPITAL_COMMUNITY)
Admission: RE | Admit: 2012-05-27 | Discharge: 2012-05-27 | Disposition: A | Discharge status: Home / Self Care | Payer: Self-pay | Source: Ambulatory Visit | Attending: Cardiovascular Disease | Admitting: Cardiovascular Disease

## 2012-05-28 ENCOUNTER — Encounter (HOSPITAL_COMMUNITY)
Admission: RE | Admit: 2012-05-28 | Discharge: 2012-05-28 | Disposition: A | Discharge status: Home / Self Care | Payer: Self-pay | Source: Ambulatory Visit | Attending: Cardiovascular Disease | Admitting: Cardiovascular Disease

## 2012-06-01 ENCOUNTER — Encounter (HOSPITAL_COMMUNITY): Payer: Self-pay

## 2012-06-03 ENCOUNTER — Encounter (HOSPITAL_COMMUNITY): Payer: Self-pay

## 2012-06-04 ENCOUNTER — Encounter (HOSPITAL_COMMUNITY)
Admission: RE | Admit: 2012-06-04 | Discharge: 2012-06-04 | Disposition: A | Discharge status: Home / Self Care | Payer: Self-pay | Source: Ambulatory Visit | Attending: Cardiovascular Disease | Admitting: Cardiovascular Disease

## 2012-06-07 ENCOUNTER — Encounter: Payer: Self-pay | Admitting: Cardiovascular Disease

## 2012-06-07 ENCOUNTER — Ambulatory Visit (INDEPENDENT_AMBULATORY_CARE_PROVIDER_SITE_OTHER): Payer: Medicare Other | Admitting: Cardiovascular Disease

## 2012-06-07 VITALS — BP 143/77 | HR 70 | Resp 16 | Ht 67.0 in | Wt 122.0 lb

## 2012-06-07 DIAGNOSIS — I428 Other cardiomyopathies: Secondary | ICD-10-CM

## 2012-06-07 NOTE — Progress Notes (Signed)
HPI:  66 year-old woman with history of Takotsubo's cardiomyopathy presenting for followup evaluation. Her cardiac event occurred in 2007 after a stressful meeting at work. Her LV function has completely normalized. Cardiac catheterization back in 2007 showed no obstructive CAD.  Overall she is doing well. She continues to enjoy retirement. She has occasional chest pains but no exertional symptoms. Her chest pain is fleeting. She has mild exertional dyspnea with walking up hills, but otherwise has no complaints with routine activities or low-level exercise. Her symptoms are unchanged over time. She denies palpitations, lightheadedness, leg swelling, orthopnea, or PND.  Outpatient Encounter Prescriptions as of 06/07/2012  Medication Sig Dispense Refill  . aspirin 81 MG tablet Take 81 mg by mouth daily.        . balsalazide (COLAZAL) 750 MG capsule Take 2 capsules (1,500 mg total) by mouth 3 (three) times daily. Take 6 tablets by mouth once daily  540 capsule  1  . benazepril (LOTENSIN) 10 MG tablet Take 0.5 tablets (5 mg total) by mouth daily.  90 tablet  3  . Calcium Carbonate-Vitamin D (CALCIUM 600 + D PO) Take 1 tablet by mouth daily.       . cholecalciferol (VITAMIN D) 1000 UNITS tablet Take 1,000 Units by mouth daily.        . furosemide (LASIX) 20 MG tablet TAKE 1 TABLET BY MOUTH DAILY  90 tablet  0  . mesalamine (CANASA) 1000 MG suppository Place 1 suppository (1,000 mg total) rectally at bedtime.  30 suppository  12  . Omega-3 Fatty Acids (FISH OIL) 1200 MG CAPS Take by mouth daily.        . pantoprazole (PROTONIX) 40 MG tablet Take 1 tablet (40 mg total) by mouth daily.  90 tablet  2  . potassium chloride SA (K-DUR,KLOR-CON) 20 MEQ tablet Take 20 mEq by mouth daily.      . vitamin C (ASCORBIC ACID) 500 MG tablet Take 500 mg by mouth daily.        . [DISCONTINUED] doxycycline (DORYX) 100 MG EC tablet Take 100 mg by mouth 2 (two) times daily.      . [DISCONTINUED] potassium chloride SA  (K-DUR,KLOR-CON) 20 MEQ tablet Take 1 tablet (20 mEq total) by mouth daily.  90 tablet  3    Allergies  Allergen Reactions  . Compazine (Prochlorperazine Edisylate) Other (See Comments)    paralysis of face  . Prochlorperazine Edisylate     REACTION: extrapyrimadal syndrome  . Tetanus-Diphtheria Toxoids Td     REACTION: arm swells    Past Medical History  Diagnosis Date  . Takotsubo cardiomyopathy 2007  . Raynaud's disease   . Mitral valve prolapse   . Ulcerative colitis   . Deep vein thrombosis   . Telangiectasia   . Hyperthyroidism   . GERD (gastroesophageal reflux disease)   . Cardiomyopathy   . Myocardial infarction     ROS: Negative except as per HPI  BP 143/77  Pulse 70  Resp 16  Ht 5\' 7"  (1.702 m)  Wt 55.339 kg (122 lb)  BMI 19.11 kg/m2  PHYSICAL EXAM: Pt is alert and oriented, pleasant, thin woman in NAD HEENT: normal Neck: JVP - normal, carotids 2+= without bruits Lungs: CTA bilaterally CV: RRR without murmur or gallop Abd: soft, NT, Positive BS, no hepatomegaly Ext: no C/C/E, distal pulses intact and equal Skin: warm/dry no rash  EKG:  Normal sinus rhythm 70 beats per minute, possible left atrial enlargement. Otherwise within normal limits.  ASSESSMENT AND PLAN: 1. Takotsubo's cardiomyopathy. LV function normalized and no recent echo data. She has minimal symptoms, and really has no exertional complaints. Will continue her current medical program without changes.  2. Lipids - labs from Dr Deneen Harts office reviewed and lipid panel is favorable. Chol 213, HDL 78, LDL 122, Trig 66.  I'll see her back in 12 months for follow-up unless problems arise in the interim.  Tonny Bollman 06/07/2012 1:15 PM

## 2012-06-07 NOTE — Patient Instructions (Signed)
Your physician wants you to follow-up in: 1 YEAR with Dr Cooper.  You will receive a reminder letter in the mail two months in advance. If you don't receive a letter, please call our office to schedule the follow-up appointment.  Your physician recommends that you continue on your current medications as directed. Please refer to the Current Medication list given to you today.  

## 2012-06-08 ENCOUNTER — Encounter (HOSPITAL_COMMUNITY)
Admission: RE | Admit: 2012-06-08 | Discharge: 2012-06-08 | Disposition: A | Discharge status: Home / Self Care | Payer: Self-pay | Source: Ambulatory Visit | Attending: Cardiovascular Disease | Admitting: Cardiovascular Disease

## 2012-06-09 ENCOUNTER — Encounter (HOSPITAL_COMMUNITY)
Admission: RE | Admit: 2012-06-09 | Discharge: 2012-06-09 | Disposition: A | Discharge status: Home / Self Care | Payer: Self-pay | Source: Ambulatory Visit | Attending: Cardiovascular Disease | Admitting: Cardiovascular Disease

## 2012-06-10 ENCOUNTER — Encounter (HOSPITAL_COMMUNITY): Payer: Self-pay

## 2012-06-11 ENCOUNTER — Encounter (HOSPITAL_COMMUNITY)
Admission: RE | Admit: 2012-06-11 | Discharge: 2012-06-11 | Disposition: A | Discharge status: Home / Self Care | Payer: Self-pay | Source: Ambulatory Visit | Attending: Cardiovascular Disease | Admitting: Cardiovascular Disease

## 2012-06-15 ENCOUNTER — Encounter (HOSPITAL_COMMUNITY)
Admission: RE | Admit: 2012-06-15 | Discharge: 2012-06-15 | Disposition: A | Discharge status: Home / Self Care | Payer: Self-pay | Source: Ambulatory Visit | Attending: Cardiovascular Disease | Admitting: Cardiovascular Disease

## 2012-06-16 ENCOUNTER — Encounter (HOSPITAL_COMMUNITY)
Admission: RE | Admit: 2012-06-16 | Discharge: 2012-06-16 | Disposition: A | Discharge status: Home / Self Care | Payer: Self-pay | Source: Ambulatory Visit | Attending: Cardiovascular Disease | Admitting: Cardiovascular Disease

## 2012-06-22 ENCOUNTER — Encounter (HOSPITAL_COMMUNITY)
Admission: RE | Admit: 2012-06-22 | Discharge: 2012-06-22 | Disposition: A | Discharge status: Home / Self Care | Payer: Self-pay | Source: Ambulatory Visit | Attending: Cardiovascular Disease | Admitting: Cardiovascular Disease

## 2012-06-22 DIAGNOSIS — Z7901 Long term (current) use of anticoagulants: Secondary | ICD-10-CM | POA: Insufficient documentation

## 2012-06-22 DIAGNOSIS — I5181 Takotsubo syndrome: Secondary | ICD-10-CM | POA: Insufficient documentation

## 2012-06-22 DIAGNOSIS — I252 Old myocardial infarction: Secondary | ICD-10-CM | POA: Insufficient documentation

## 2012-06-22 DIAGNOSIS — Z5189 Encounter for other specified aftercare: Secondary | ICD-10-CM | POA: Insufficient documentation

## 2012-06-22 DIAGNOSIS — I10 Essential (primary) hypertension: Secondary | ICD-10-CM | POA: Insufficient documentation

## 2012-06-22 DIAGNOSIS — I059 Rheumatic mitral valve disease, unspecified: Secondary | ICD-10-CM | POA: Insufficient documentation

## 2012-06-22 DIAGNOSIS — E785 Hyperlipidemia, unspecified: Secondary | ICD-10-CM | POA: Insufficient documentation

## 2012-06-22 DIAGNOSIS — I471 Supraventricular tachycardia, unspecified: Secondary | ICD-10-CM | POA: Insufficient documentation

## 2012-06-23 ENCOUNTER — Ambulatory Visit: Payer: Medicare Other | Admitting: Internal Medicine

## 2012-06-24 ENCOUNTER — Encounter (HOSPITAL_COMMUNITY)
Admission: RE | Admit: 2012-06-24 | Discharge: 2012-06-24 | Disposition: A | Discharge status: Home / Self Care | Payer: Self-pay | Source: Ambulatory Visit | Attending: Cardiovascular Disease | Admitting: Cardiovascular Disease

## 2012-06-25 ENCOUNTER — Encounter (HOSPITAL_COMMUNITY)
Admission: RE | Admit: 2012-06-25 | Discharge: 2012-06-25 | Disposition: A | Discharge status: Home / Self Care | Payer: Self-pay | Source: Ambulatory Visit | Attending: Cardiovascular Disease | Admitting: Cardiovascular Disease

## 2012-06-29 ENCOUNTER — Encounter (HOSPITAL_COMMUNITY)
Admission: RE | Admit: 2012-06-29 | Discharge: 2012-06-29 | Disposition: A | Discharge status: Home / Self Care | Payer: Self-pay | Source: Ambulatory Visit | Attending: Cardiovascular Disease | Admitting: Cardiovascular Disease

## 2012-07-01 ENCOUNTER — Encounter (HOSPITAL_COMMUNITY)
Admission: RE | Admit: 2012-07-01 | Discharge: 2012-07-01 | Disposition: A | Discharge status: Home / Self Care | Payer: Self-pay | Source: Ambulatory Visit | Attending: Cardiovascular Disease | Admitting: Cardiovascular Disease

## 2012-07-02 ENCOUNTER — Encounter (HOSPITAL_COMMUNITY)
Admission: RE | Admit: 2012-07-02 | Discharge: 2012-07-02 | Disposition: A | Discharge status: Home / Self Care | Payer: Self-pay | Source: Ambulatory Visit | Attending: Cardiovascular Disease | Admitting: Cardiovascular Disease

## 2012-07-06 ENCOUNTER — Encounter (HOSPITAL_COMMUNITY)
Admission: RE | Admit: 2012-07-06 | Discharge: 2012-07-06 | Disposition: A | Discharge status: Home / Self Care | Payer: Self-pay | Source: Ambulatory Visit | Attending: Cardiovascular Disease | Admitting: Cardiovascular Disease

## 2012-07-08 ENCOUNTER — Encounter (HOSPITAL_COMMUNITY)
Admission: RE | Admit: 2012-07-08 | Discharge: 2012-07-08 | Disposition: A | Discharge status: Home / Self Care | Payer: Self-pay | Source: Ambulatory Visit | Attending: Cardiovascular Disease | Admitting: Cardiovascular Disease

## 2012-07-09 ENCOUNTER — Encounter (HOSPITAL_COMMUNITY)
Admission: RE | Admit: 2012-07-09 | Discharge: 2012-07-09 | Disposition: A | Discharge status: Home / Self Care | Payer: Self-pay | Source: Ambulatory Visit | Attending: Cardiovascular Disease | Admitting: Cardiovascular Disease

## 2012-07-13 ENCOUNTER — Encounter (HOSPITAL_COMMUNITY): Payer: Self-pay

## 2012-07-15 ENCOUNTER — Encounter (HOSPITAL_COMMUNITY): Payer: Self-pay

## 2012-07-16 ENCOUNTER — Encounter (HOSPITAL_COMMUNITY)
Admission: RE | Admit: 2012-07-16 | Discharge: 2012-07-16 | Disposition: A | Discharge status: Home / Self Care | Payer: Self-pay | Source: Ambulatory Visit | Attending: Cardiovascular Disease | Admitting: Cardiovascular Disease

## 2012-07-20 ENCOUNTER — Encounter (HOSPITAL_COMMUNITY)
Admission: RE | Admit: 2012-07-20 | Discharge: 2012-07-20 | Disposition: A | Discharge status: Home / Self Care | Payer: Self-pay | Source: Ambulatory Visit | Attending: Cardiovascular Disease | Admitting: Cardiovascular Disease

## 2012-07-22 ENCOUNTER — Encounter (HOSPITAL_COMMUNITY)
Admission: RE | Admit: 2012-07-22 | Discharge: 2012-07-22 | Disposition: A | Discharge status: Home / Self Care | Payer: Self-pay | Source: Ambulatory Visit | Attending: Cardiovascular Disease | Admitting: Cardiovascular Disease

## 2012-07-22 DIAGNOSIS — I471 Supraventricular tachycardia, unspecified: Secondary | ICD-10-CM | POA: Insufficient documentation

## 2012-07-22 DIAGNOSIS — Z5189 Encounter for other specified aftercare: Secondary | ICD-10-CM | POA: Insufficient documentation

## 2012-07-22 DIAGNOSIS — I5181 Takotsubo syndrome: Secondary | ICD-10-CM | POA: Insufficient documentation

## 2012-07-22 DIAGNOSIS — I10 Essential (primary) hypertension: Secondary | ICD-10-CM | POA: Insufficient documentation

## 2012-07-22 DIAGNOSIS — I059 Rheumatic mitral valve disease, unspecified: Secondary | ICD-10-CM | POA: Insufficient documentation

## 2012-07-22 DIAGNOSIS — I252 Old myocardial infarction: Secondary | ICD-10-CM | POA: Insufficient documentation

## 2012-07-22 DIAGNOSIS — Z7901 Long term (current) use of anticoagulants: Secondary | ICD-10-CM | POA: Insufficient documentation

## 2012-07-22 DIAGNOSIS — E785 Hyperlipidemia, unspecified: Secondary | ICD-10-CM | POA: Insufficient documentation

## 2012-07-23 ENCOUNTER — Encounter (HOSPITAL_COMMUNITY)
Admission: RE | Admit: 2012-07-23 | Discharge: 2012-07-23 | Disposition: A | Discharge status: Home / Self Care | Payer: Self-pay | Source: Ambulatory Visit | Attending: Cardiovascular Disease | Admitting: Cardiovascular Disease

## 2012-07-26 ENCOUNTER — Other Ambulatory Visit: Payer: Self-pay | Admitting: Cardiovascular Disease

## 2012-07-27 ENCOUNTER — Encounter (HOSPITAL_COMMUNITY)
Admission: RE | Admit: 2012-07-27 | Discharge: 2012-07-27 | Disposition: A | Discharge status: Home / Self Care | Payer: Self-pay | Source: Ambulatory Visit | Attending: Cardiovascular Disease | Admitting: Cardiovascular Disease

## 2012-07-27 ENCOUNTER — Other Ambulatory Visit: Payer: Self-pay | Admitting: *Deleted

## 2012-07-29 ENCOUNTER — Encounter (HOSPITAL_COMMUNITY)
Admission: RE | Admit: 2012-07-29 | Discharge: 2012-07-29 | Disposition: A | Discharge status: Home / Self Care | Payer: Self-pay | Source: Ambulatory Visit | Attending: Cardiovascular Disease | Admitting: Cardiovascular Disease

## 2012-07-30 ENCOUNTER — Encounter (HOSPITAL_COMMUNITY)
Admission: RE | Admit: 2012-07-30 | Discharge: 2012-07-30 | Disposition: A | Discharge status: Home / Self Care | Payer: Self-pay | Source: Ambulatory Visit | Attending: Cardiovascular Disease | Admitting: Cardiovascular Disease

## 2012-08-02 ENCOUNTER — Other Ambulatory Visit: Payer: Self-pay | Admitting: *Deleted

## 2012-08-02 MED ORDER — BENAZEPRIL HCL 10 MG PO TABS: ORAL_TABLET | ORAL | Status: DC

## 2012-08-02 MED ORDER — POTASSIUM CHLORIDE CRYS ER 20 MEQ PO TBCR: 20.0000 meq | EXTENDED_RELEASE_TABLET | Freq: Every day | ORAL | Status: DC

## 2012-08-02 MED ORDER — FUROSEMIDE 20 MG PO TABS: 20.0000 mg | ORAL_TABLET | Freq: Every day | ORAL | Status: DC

## 2012-08-03 ENCOUNTER — Encounter (HOSPITAL_COMMUNITY)
Admission: RE | Admit: 2012-08-03 | Discharge: 2012-08-03 | Disposition: A | Discharge status: Home / Self Care | Payer: Self-pay | Source: Ambulatory Visit | Attending: Cardiovascular Disease | Admitting: Cardiovascular Disease

## 2012-08-03 ENCOUNTER — Other Ambulatory Visit (HOSPITAL_COMMUNITY): Payer: Self-pay | Admitting: Obstetrics and Gynecology

## 2012-08-03 DIAGNOSIS — Z1231 Encounter for screening mammogram for malignant neoplasm of breast: Secondary | ICD-10-CM

## 2012-08-05 ENCOUNTER — Encounter (HOSPITAL_COMMUNITY)
Admission: RE | Admit: 2012-08-05 | Discharge: 2012-08-05 | Disposition: A | Discharge status: Home / Self Care | Payer: Self-pay | Source: Ambulatory Visit | Attending: Cardiovascular Disease | Admitting: Cardiovascular Disease

## 2012-08-06 ENCOUNTER — Encounter (HOSPITAL_COMMUNITY)
Admission: RE | Admit: 2012-08-06 | Discharge: 2012-08-06 | Disposition: A | Discharge status: Home / Self Care | Payer: Self-pay | Source: Ambulatory Visit | Attending: Cardiovascular Disease | Admitting: Cardiovascular Disease

## 2012-08-10 ENCOUNTER — Encounter (HOSPITAL_COMMUNITY)
Admission: RE | Admit: 2012-08-10 | Discharge: 2012-08-10 | Disposition: A | Discharge status: Home / Self Care | Payer: Self-pay | Source: Ambulatory Visit | Attending: Cardiovascular Disease | Admitting: Cardiovascular Disease

## 2012-08-12 ENCOUNTER — Encounter (HOSPITAL_COMMUNITY)
Admission: RE | Admit: 2012-08-12 | Discharge: 2012-08-12 | Disposition: A | Discharge status: Home / Self Care | Payer: Self-pay | Source: Ambulatory Visit | Attending: Cardiovascular Disease | Admitting: Cardiovascular Disease

## 2012-08-13 ENCOUNTER — Encounter (HOSPITAL_COMMUNITY)
Admission: RE | Admit: 2012-08-13 | Discharge: 2012-08-13 | Disposition: A | Discharge status: Home / Self Care | Payer: Self-pay | Source: Ambulatory Visit | Attending: Cardiovascular Disease | Admitting: Cardiovascular Disease

## 2012-08-17 ENCOUNTER — Encounter (HOSPITAL_COMMUNITY)
Admission: RE | Admit: 2012-08-17 | Discharge: 2012-08-17 | Disposition: A | Discharge status: Home / Self Care | Payer: Self-pay | Source: Ambulatory Visit | Attending: Cardiovascular Disease | Admitting: Cardiovascular Disease

## 2012-08-19 ENCOUNTER — Encounter (HOSPITAL_COMMUNITY): Payer: Self-pay

## 2012-08-20 ENCOUNTER — Encounter (HOSPITAL_COMMUNITY)
Admission: RE | Admit: 2012-08-20 | Discharge: 2012-08-20 | Disposition: A | Discharge status: Home / Self Care | Payer: Self-pay | Source: Ambulatory Visit | Attending: Cardiovascular Disease | Admitting: Cardiovascular Disease

## 2012-08-24 ENCOUNTER — Encounter (HOSPITAL_COMMUNITY)
Admission: RE | Admit: 2012-08-24 | Discharge: 2012-08-24 | Disposition: A | Discharge status: Home / Self Care | Payer: Self-pay | Source: Ambulatory Visit | Attending: Cardiovascular Disease | Admitting: Cardiovascular Disease

## 2012-08-24 DIAGNOSIS — I059 Rheumatic mitral valve disease, unspecified: Secondary | ICD-10-CM | POA: Insufficient documentation

## 2012-08-24 DIAGNOSIS — I5181 Takotsubo syndrome: Secondary | ICD-10-CM | POA: Insufficient documentation

## 2012-08-24 DIAGNOSIS — I471 Supraventricular tachycardia, unspecified: Secondary | ICD-10-CM | POA: Insufficient documentation

## 2012-08-24 DIAGNOSIS — Z7901 Long term (current) use of anticoagulants: Secondary | ICD-10-CM | POA: Insufficient documentation

## 2012-08-24 DIAGNOSIS — I252 Old myocardial infarction: Secondary | ICD-10-CM | POA: Insufficient documentation

## 2012-08-24 DIAGNOSIS — Z5189 Encounter for other specified aftercare: Secondary | ICD-10-CM | POA: Insufficient documentation

## 2012-08-24 DIAGNOSIS — I10 Essential (primary) hypertension: Secondary | ICD-10-CM | POA: Insufficient documentation

## 2012-08-24 DIAGNOSIS — E785 Hyperlipidemia, unspecified: Secondary | ICD-10-CM | POA: Insufficient documentation

## 2012-08-26 ENCOUNTER — Encounter (HOSPITAL_COMMUNITY)
Admission: RE | Admit: 2012-08-26 | Discharge: 2012-08-26 | Disposition: A | Discharge status: Home / Self Care | Payer: Self-pay | Source: Ambulatory Visit | Attending: Cardiovascular Disease | Admitting: Cardiovascular Disease

## 2012-08-27 ENCOUNTER — Encounter (HOSPITAL_COMMUNITY)
Admission: RE | Admit: 2012-08-27 | Discharge: 2012-08-27 | Disposition: A | Discharge status: Home / Self Care | Payer: Self-pay | Source: Ambulatory Visit | Attending: Cardiovascular Disease | Admitting: Cardiovascular Disease

## 2012-08-31 ENCOUNTER — Encounter (HOSPITAL_COMMUNITY)
Admission: RE | Admit: 2012-08-31 | Discharge: 2012-08-31 | Disposition: A | Discharge status: Home / Self Care | Payer: Self-pay | Source: Ambulatory Visit | Attending: Cardiovascular Disease | Admitting: Cardiovascular Disease

## 2012-09-02 ENCOUNTER — Encounter (HOSPITAL_COMMUNITY): Payer: Self-pay

## 2012-09-03 ENCOUNTER — Encounter (HOSPITAL_COMMUNITY): Payer: Self-pay

## 2012-09-06 ENCOUNTER — Ambulatory Visit (HOSPITAL_COMMUNITY)
Admission: RE | Admit: 2012-09-06 | Discharge: 2012-09-06 | Disposition: A | Discharge status: Home / Self Care | Payer: Medicare Other | Source: Ambulatory Visit | Attending: Obstetrics and Gynecology | Admitting: Obstetrics and Gynecology

## 2012-09-06 DIAGNOSIS — Z1231 Encounter for screening mammogram for malignant neoplasm of breast: Secondary | ICD-10-CM

## 2012-09-07 ENCOUNTER — Encounter (HOSPITAL_COMMUNITY)
Admission: RE | Admit: 2012-09-07 | Discharge: 2012-09-07 | Disposition: A | Discharge status: Home / Self Care | Payer: Self-pay | Source: Ambulatory Visit | Attending: Cardiovascular Disease | Admitting: Cardiovascular Disease

## 2012-09-09 ENCOUNTER — Encounter (HOSPITAL_COMMUNITY)
Admission: RE | Admit: 2012-09-09 | Discharge: 2012-09-09 | Disposition: A | Discharge status: Home / Self Care | Payer: Self-pay | Source: Ambulatory Visit | Attending: Cardiovascular Disease | Admitting: Cardiovascular Disease

## 2012-09-10 ENCOUNTER — Encounter (HOSPITAL_COMMUNITY): Payer: Self-pay

## 2012-09-14 ENCOUNTER — Encounter (HOSPITAL_COMMUNITY)
Admission: RE | Admit: 2012-09-14 | Discharge: 2012-09-14 | Disposition: A | Discharge status: Home / Self Care | Payer: Self-pay | Source: Ambulatory Visit | Attending: Cardiovascular Disease | Admitting: Cardiovascular Disease

## 2012-09-16 ENCOUNTER — Encounter (HOSPITAL_COMMUNITY)
Admission: RE | Admit: 2012-09-16 | Discharge: 2012-09-16 | Disposition: A | Discharge status: Home / Self Care | Payer: Self-pay | Source: Ambulatory Visit | Attending: Cardiovascular Disease | Admitting: Cardiovascular Disease

## 2012-09-17 ENCOUNTER — Encounter (HOSPITAL_COMMUNITY)
Admission: RE | Admit: 2012-09-17 | Discharge: 2012-09-17 | Disposition: A | Discharge status: Home / Self Care | Payer: Self-pay | Source: Ambulatory Visit | Attending: Cardiovascular Disease | Admitting: Cardiovascular Disease

## 2012-09-20 ENCOUNTER — Encounter (HOSPITAL_COMMUNITY)
Admission: RE | Admit: 2012-09-20 | Discharge: 2012-09-20 | Disposition: A | Discharge status: Home / Self Care | Payer: Self-pay | Source: Ambulatory Visit | Attending: Cardiovascular Disease | Admitting: Cardiovascular Disease

## 2012-09-20 DIAGNOSIS — I10 Essential (primary) hypertension: Secondary | ICD-10-CM | POA: Insufficient documentation

## 2012-09-20 DIAGNOSIS — Z5189 Encounter for other specified aftercare: Secondary | ICD-10-CM | POA: Insufficient documentation

## 2012-09-20 DIAGNOSIS — I471 Supraventricular tachycardia, unspecified: Secondary | ICD-10-CM | POA: Insufficient documentation

## 2012-09-20 DIAGNOSIS — Z7901 Long term (current) use of anticoagulants: Secondary | ICD-10-CM | POA: Insufficient documentation

## 2012-09-20 DIAGNOSIS — I059 Rheumatic mitral valve disease, unspecified: Secondary | ICD-10-CM | POA: Insufficient documentation

## 2012-09-20 DIAGNOSIS — E785 Hyperlipidemia, unspecified: Secondary | ICD-10-CM | POA: Insufficient documentation

## 2012-09-20 DIAGNOSIS — I5181 Takotsubo syndrome: Secondary | ICD-10-CM | POA: Insufficient documentation

## 2012-09-20 DIAGNOSIS — I252 Old myocardial infarction: Secondary | ICD-10-CM | POA: Insufficient documentation

## 2012-09-21 ENCOUNTER — Encounter (HOSPITAL_COMMUNITY): Payer: Self-pay

## 2012-09-23 ENCOUNTER — Encounter (HOSPITAL_COMMUNITY): Payer: Self-pay

## 2012-09-24 ENCOUNTER — Encounter (HOSPITAL_COMMUNITY): Payer: Self-pay

## 2012-09-28 ENCOUNTER — Encounter (HOSPITAL_COMMUNITY): Payer: Self-pay

## 2012-09-30 ENCOUNTER — Encounter (HOSPITAL_COMMUNITY)
Admission: RE | Admit: 2012-09-30 | Discharge: 2012-09-30 | Disposition: A | Discharge status: Home / Self Care | Payer: Self-pay | Source: Ambulatory Visit | Attending: Cardiovascular Disease | Admitting: Cardiovascular Disease

## 2012-10-01 ENCOUNTER — Encounter (HOSPITAL_COMMUNITY)
Admission: RE | Admit: 2012-10-01 | Discharge: 2012-10-01 | Disposition: A | Discharge status: Home / Self Care | Payer: Self-pay | Source: Ambulatory Visit | Attending: Cardiovascular Disease | Admitting: Cardiovascular Disease

## 2012-10-05 ENCOUNTER — Encounter (HOSPITAL_COMMUNITY): Payer: Self-pay

## 2012-10-07 ENCOUNTER — Encounter (HOSPITAL_COMMUNITY)
Admission: RE | Admit: 2012-10-07 | Discharge: 2012-10-07 | Disposition: A | Discharge status: Home / Self Care | Payer: Self-pay | Source: Ambulatory Visit | Attending: Cardiovascular Disease | Admitting: Cardiovascular Disease

## 2012-10-08 ENCOUNTER — Encounter (HOSPITAL_COMMUNITY)
Admission: RE | Admit: 2012-10-08 | Discharge: 2012-10-08 | Disposition: A | Discharge status: Home / Self Care | Payer: Self-pay | Source: Ambulatory Visit | Attending: Cardiovascular Disease | Admitting: Cardiovascular Disease

## 2012-10-12 ENCOUNTER — Encounter (HOSPITAL_COMMUNITY)
Admission: RE | Admit: 2012-10-12 | Discharge: 2012-10-12 | Disposition: A | Discharge status: Home / Self Care | Payer: Self-pay | Source: Ambulatory Visit | Attending: Cardiovascular Disease | Admitting: Cardiovascular Disease

## 2012-10-14 ENCOUNTER — Encounter (HOSPITAL_COMMUNITY)
Admission: RE | Admit: 2012-10-14 | Discharge: 2012-10-14 | Disposition: A | Discharge status: Home / Self Care | Payer: Self-pay | Source: Ambulatory Visit | Attending: Cardiovascular Disease | Admitting: Cardiovascular Disease

## 2012-10-15 ENCOUNTER — Encounter (HOSPITAL_COMMUNITY)
Admission: RE | Admit: 2012-10-15 | Discharge: 2012-10-15 | Disposition: A | Discharge status: Home / Self Care | Payer: Self-pay | Source: Ambulatory Visit | Attending: Cardiovascular Disease | Admitting: Cardiovascular Disease

## 2012-10-19 ENCOUNTER — Encounter (HOSPITAL_COMMUNITY)
Admission: RE | Admit: 2012-10-19 | Discharge: 2012-10-19 | Disposition: A | Discharge status: Home / Self Care | Payer: Self-pay | Source: Ambulatory Visit | Attending: Cardiovascular Disease | Admitting: Cardiovascular Disease

## 2012-10-19 DIAGNOSIS — I10 Essential (primary) hypertension: Secondary | ICD-10-CM | POA: Insufficient documentation

## 2012-10-19 DIAGNOSIS — E785 Hyperlipidemia, unspecified: Secondary | ICD-10-CM | POA: Insufficient documentation

## 2012-10-19 DIAGNOSIS — I252 Old myocardial infarction: Secondary | ICD-10-CM | POA: Insufficient documentation

## 2012-10-19 DIAGNOSIS — I471 Supraventricular tachycardia, unspecified: Secondary | ICD-10-CM | POA: Insufficient documentation

## 2012-10-19 DIAGNOSIS — Z5189 Encounter for other specified aftercare: Secondary | ICD-10-CM | POA: Insufficient documentation

## 2012-10-19 DIAGNOSIS — Z7901 Long term (current) use of anticoagulants: Secondary | ICD-10-CM | POA: Insufficient documentation

## 2012-10-19 DIAGNOSIS — I5181 Takotsubo syndrome: Secondary | ICD-10-CM | POA: Insufficient documentation

## 2012-10-19 DIAGNOSIS — I059 Rheumatic mitral valve disease, unspecified: Secondary | ICD-10-CM | POA: Insufficient documentation

## 2012-10-21 ENCOUNTER — Encounter (HOSPITAL_COMMUNITY)
Admission: RE | Admit: 2012-10-21 | Discharge: 2012-10-21 | Disposition: A | Discharge status: Home / Self Care | Payer: Self-pay | Source: Ambulatory Visit | Attending: Cardiovascular Disease | Admitting: Cardiovascular Disease

## 2012-10-22 ENCOUNTER — Encounter (HOSPITAL_COMMUNITY)
Admission: RE | Admit: 2012-10-22 | Discharge: 2012-10-22 | Disposition: A | Discharge status: Home / Self Care | Payer: Self-pay | Source: Ambulatory Visit | Attending: Cardiovascular Disease | Admitting: Cardiovascular Disease

## 2012-10-23 ENCOUNTER — Other Ambulatory Visit: Payer: Self-pay | Admitting: Cardiovascular Disease

## 2012-10-26 ENCOUNTER — Encounter (HOSPITAL_COMMUNITY): Payer: Self-pay

## 2012-10-28 ENCOUNTER — Encounter (HOSPITAL_COMMUNITY)
Admission: RE | Admit: 2012-10-28 | Discharge: 2012-10-28 | Disposition: A | Discharge status: Home / Self Care | Payer: Self-pay | Source: Ambulatory Visit | Attending: Cardiovascular Disease | Admitting: Cardiovascular Disease

## 2012-10-29 ENCOUNTER — Encounter (HOSPITAL_COMMUNITY)
Admission: RE | Admit: 2012-10-29 | Discharge: 2012-10-29 | Disposition: A | Discharge status: Home / Self Care | Payer: Self-pay | Source: Ambulatory Visit | Attending: Cardiovascular Disease | Admitting: Cardiovascular Disease

## 2012-11-02 ENCOUNTER — Encounter (HOSPITAL_COMMUNITY)
Admission: RE | Admit: 2012-11-02 | Discharge: 2012-11-02 | Disposition: A | Discharge status: Home / Self Care | Payer: Self-pay | Source: Ambulatory Visit | Attending: Cardiovascular Disease | Admitting: Cardiovascular Disease

## 2012-11-04 ENCOUNTER — Encounter (HOSPITAL_COMMUNITY)
Admission: RE | Admit: 2012-11-04 | Discharge: 2012-11-04 | Disposition: A | Discharge status: Home / Self Care | Payer: Self-pay | Source: Ambulatory Visit | Attending: Cardiovascular Disease | Admitting: Cardiovascular Disease

## 2012-11-05 ENCOUNTER — Encounter (HOSPITAL_COMMUNITY)
Admission: RE | Admit: 2012-11-05 | Discharge: 2012-11-05 | Disposition: A | Discharge status: Home / Self Care | Payer: Self-pay | Source: Ambulatory Visit | Attending: Cardiovascular Disease | Admitting: Cardiovascular Disease

## 2012-11-09 ENCOUNTER — Encounter (HOSPITAL_COMMUNITY)
Admission: RE | Admit: 2012-11-09 | Discharge: 2012-11-09 | Disposition: A | Discharge status: Home / Self Care | Payer: Self-pay | Source: Ambulatory Visit | Attending: Cardiovascular Disease | Admitting: Cardiovascular Disease

## 2012-11-11 ENCOUNTER — Encounter (HOSPITAL_COMMUNITY)
Admission: RE | Admit: 2012-11-11 | Discharge: 2012-11-11 | Disposition: A | Discharge status: Home / Self Care | Payer: Self-pay | Source: Ambulatory Visit | Attending: Cardiovascular Disease | Admitting: Cardiovascular Disease

## 2012-11-12 ENCOUNTER — Encounter (HOSPITAL_COMMUNITY)
Admission: RE | Admit: 2012-11-12 | Discharge: 2012-11-12 | Disposition: A | Discharge status: Home / Self Care | Payer: Self-pay | Source: Ambulatory Visit | Attending: Cardiovascular Disease | Admitting: Cardiovascular Disease

## 2012-11-15 ENCOUNTER — Telehealth: Payer: Self-pay | Admitting: Internal Medicine

## 2012-11-15 NOTE — Telephone Encounter (Signed)
She will see Amy in am- consider Uceris course. Canasa supp samples or Hydrocortisone supp for rectal symptoms.

## 2012-11-15 NOTE — Telephone Encounter (Signed)
Spoke with patient and she states she had diarrhea starting last Thursday. She had 4-5 watery stools/day. She took Imodium and was ok. This afternoon, she had a bowel movement with bright red blood in toilet water and when she wiped. Reports abdominal pain on left side that is very slight. She is on Colazal 6 tab/day. She is not taking Canasa suppositories due to cost. Patient will go to bland diet. Scheduled with Mike Gip, PA on 11/16/12 at 8:30 AM.

## 2012-11-16 ENCOUNTER — Ambulatory Visit (INDEPENDENT_AMBULATORY_CARE_PROVIDER_SITE_OTHER): Payer: Medicare Other | Admitting: Physician Assistant

## 2012-11-16 ENCOUNTER — Other Ambulatory Visit: Payer: Medicare Other

## 2012-11-16 ENCOUNTER — Encounter (HOSPITAL_COMMUNITY)
Admission: RE | Admit: 2012-11-16 | Discharge: 2012-11-16 | Disposition: A | Discharge status: Home / Self Care | Payer: Self-pay | Source: Ambulatory Visit | Attending: Cardiovascular Disease | Admitting: Cardiovascular Disease

## 2012-11-16 ENCOUNTER — Encounter: Payer: Self-pay | Admitting: Physician Assistant

## 2012-11-16 VITALS — BP 100/60 | HR 80 | Ht 67.0 in | Wt 120.0 lb

## 2012-11-16 DIAGNOSIS — K5090 Crohn's disease, unspecified, without complications: Secondary | ICD-10-CM

## 2012-11-16 DIAGNOSIS — R197 Diarrhea, unspecified: Secondary | ICD-10-CM

## 2012-11-16 DIAGNOSIS — K921 Melena: Secondary | ICD-10-CM

## 2012-11-16 DIAGNOSIS — K5010 Crohn's disease of large intestine without complications: Secondary | ICD-10-CM

## 2012-11-16 MED ORDER — PREDNISONE 20 MG PO TABS: 20.0000 mg | ORAL_TABLET | Freq: Every day | ORAL | Status: DC

## 2012-11-16 MED ORDER — BALSALAZIDE DISODIUM 750 MG PO CAPS: ORAL_CAPSULE | ORAL | Status: DC

## 2012-11-16 NOTE — Telephone Encounter (Signed)
Patient was seen by Mike Gip PA

## 2012-11-16 NOTE — Patient Instructions (Addendum)
Please go to the basement level to our lab for a stool test. We sent a prescription for Prednisone 20 mg, take as directed.  Sent to [facility]. Take the Colazal medication 4 tablets twice daily ( 8 tabs  Daily).   We made you an appointment with Dr. Juanda Chance on 12-28-2012 at 3:15 Am.

## 2012-11-16 NOTE — Progress Notes (Signed)
Subjective:    Patient ID: Kirsten Rice, female    DOB: 24-Jul-1945, 67 y.o.   MRN: [id number]  HPI Kirsten Rice is a pleasant 67 year old white female known to Dr. Lina Rice with history of inflammatory bowel disease which had been treated as ulcerative colitis in the past. She underwent colonoscopy in October of 2013 and at that time was found to have a minimally active chronic colitis throughout the colon. Path did show presence of granulomas and therefore it is felt more likely that she actually has Crohn's colitis. She  has done very well on Colazal over the past several months. She comes in today after onset last week with diarrhea generally having 4-5 watery bowel movements per day area she says she had not had any associated pain or cramping no nausea or vomiting no fever or chills. Starting yesterday she began seeing dark red blood mixed in with her bowel movements. She had not had any new medications other than doxycycline which she stopped about 3 weeks ago and had taken for about a month for a flare of rosacea. She has not had any worsening of the diarrhea but says that it persists and that she has been seeing blood with each bowel movement over the past 24 hours.     Review of Systems  Constitutional: Negative.   HENT: Negative.   Eyes: Negative.   Respiratory: Negative.   Cardiovascular: Negative.   Gastrointestinal: Positive for diarrhea and blood in stool.  Endocrine: Negative.   Genitourinary: Negative.   Allergic/Immunologic: Negative.   Neurological: Negative.   Hematological: Negative.   Psychiatric/Behavioral: Negative.    Outpatient Prescriptions Prior to Visit  Medication Sig Dispense Refill  . aspirin 81 MG tablet Take 81 mg by mouth daily.        . benazepril (LOTENSIN) 10 MG tablet Take 1/2 tablet by mouth once daily.  90 tablet  1  . Calcium Carbonate-Vitamin D (CALCIUM 600 + D PO) Take 1 tablet by mouth daily.       . cholecalciferol (VITAMIN D) 1000 UNITS tablet  Take 1,000 Units by mouth daily.        . furosemide (LASIX) 20 MG tablet Take 1 tablet (20 mg total) by mouth daily.  90 tablet  3  . mesalamine (CANASA) 1000 MG suppository Place 1 suppository (1,000 mg total) rectally at bedtime.  30 suppository  12  . Omega-3 Fatty Acids (FISH OIL) 1200 MG CAPS Take by mouth daily.        . pantoprazole (PROTONIX) 40 MG tablet Take 1 tablet (40 mg total) by mouth daily.  90 tablet  2  . potassium chloride SA (K-DUR,KLOR-CON) 20 MEQ tablet TAKE 1 TABLET BY MOUTH DAILY  90 tablet  1  . vitamin C (ASCORBIC ACID) 500 MG tablet Take 500 mg by mouth daily.        . balsalazide (COLAZAL) 750 MG capsule Take 2 capsules (1,500 mg total) by mouth 3 (three) times daily. Take 6 tablets by mouth once daily  540 capsule  1   No facility-administered medications prior to visit.   Allergies  Allergen Reactions  . Compazine (Prochlorperazine Edisylate) Other (See Comments)    paralysis of face  . Prochlorperazine Edisylate     REACTION: extrapyrimadal syndrome  . Tetanus-Diphtheria Toxoids Td     REACTION: arm swells   Patient Active Problem List   Diagnosis Date Noted  . CARDIOMYOPATHY 05/17/2010  . Takotsubo syndrome 04/30/2009  . MITRAL VALVE PROLAPSE 04/27/2009  .  TELANGIECTASIA 04/27/2009  . ALLERGIC RHINITIS 04/27/2009  . CROHN'S DISEASE 04/27/2009  . DEEP VENOUS THROMBOPHLEBITIS, HX OF 04/27/2009  . RAYNAUD'S SYNDROME, HX OF 04/27/2009  . HYPERTHYROIDISM 02/21/2008  . SORE THROAT 02/21/2008  . GERD 02/21/2008   History  Substance Use Topics  . Smoking status: Never Smoker   . Smokeless tobacco: Never Used  . Alcohol Use: 4.2 oz/week    7 Glasses of wine per week     Comment: glass of wine daily       Kirsten Rice had no medications administered during this visit.  Objective:   Physical Exam well-developed thin white female in no acute distress, pleasant blood pressure 100/60 pulse 80 height 5 foot 7 weight 120. HEENT; nontraumatic normocephalic  EOMI PERRLA sclera anicteric, Neck; supple no JVD, Cardiovascular; regular rate and rhythm with S1-S2 no murmur or gout Pulmonary; clear bilaterally, Abdomen; soft only tender in the left lower quadrant there is no guarding or rebound no palpable mass or hepatomegaly bowel sounds are present, Rectal; exam not done, Extremities ;no clubbing cyanosis or edema skin warm and dry, Psych; mood and affect normal and appropriate        Assessment & Plan:  #85  67 year old female probable Crohn's colitis with exacerbation manifested by diarrhea and hematochezia. Will rule out C. difficile given recent antibiotic use #2 cardiomyopathy #3 hyperthyroidism #4 GERD- stable  Plan; increase Colazal to  4 tablets by mouth twice daily Start  prednisone 20 mg by mouth every morning. She will remain on 20 mg until followup office visit with Dr. Juanda Rice  or myself in 2 weeks and we'll plan gradual taper. Check stool for C. difficile PCR

## 2012-11-16 NOTE — Progress Notes (Signed)
Reviewed and agree. I will see her in follow up. 

## 2012-11-18 ENCOUNTER — Encounter (HOSPITAL_COMMUNITY)
Admission: RE | Admit: 2012-11-18 | Discharge: 2012-11-18 | Disposition: A | Discharge status: Home / Self Care | Payer: Self-pay | Source: Ambulatory Visit | Attending: Cardiovascular Disease | Admitting: Cardiovascular Disease

## 2012-11-18 DIAGNOSIS — E785 Hyperlipidemia, unspecified: Secondary | ICD-10-CM | POA: Insufficient documentation

## 2012-11-18 DIAGNOSIS — I252 Old myocardial infarction: Secondary | ICD-10-CM | POA: Insufficient documentation

## 2012-11-18 DIAGNOSIS — I059 Rheumatic mitral valve disease, unspecified: Secondary | ICD-10-CM | POA: Insufficient documentation

## 2012-11-18 DIAGNOSIS — I5181 Takotsubo syndrome: Secondary | ICD-10-CM | POA: Insufficient documentation

## 2012-11-18 DIAGNOSIS — I471 Supraventricular tachycardia, unspecified: Secondary | ICD-10-CM | POA: Insufficient documentation

## 2012-11-18 DIAGNOSIS — I10 Essential (primary) hypertension: Secondary | ICD-10-CM | POA: Insufficient documentation

## 2012-11-18 DIAGNOSIS — Z5189 Encounter for other specified aftercare: Secondary | ICD-10-CM | POA: Insufficient documentation

## 2012-11-18 DIAGNOSIS — Z7901 Long term (current) use of anticoagulants: Secondary | ICD-10-CM | POA: Insufficient documentation

## 2012-11-18 LAB — CLOSTRIDIUM DIFFICILE BY PCR: Toxigenic C. Difficile by PCR: NOT DETECTED

## 2012-11-19 ENCOUNTER — Encounter (HOSPITAL_COMMUNITY): Payer: Self-pay

## 2012-11-23 ENCOUNTER — Encounter (HOSPITAL_COMMUNITY): Payer: Self-pay

## 2012-11-25 ENCOUNTER — Encounter (HOSPITAL_COMMUNITY)
Admission: RE | Admit: 2012-11-25 | Discharge: 2012-11-25 | Disposition: A | Discharge status: Home / Self Care | Payer: Self-pay | Source: Ambulatory Visit | Attending: Cardiovascular Disease | Admitting: Cardiovascular Disease

## 2012-11-26 ENCOUNTER — Encounter (HOSPITAL_COMMUNITY)
Admission: RE | Admit: 2012-11-26 | Discharge: 2012-11-26 | Disposition: A | Discharge status: Home / Self Care | Payer: Self-pay | Source: Ambulatory Visit | Attending: Cardiovascular Disease | Admitting: Cardiovascular Disease

## 2012-11-30 ENCOUNTER — Encounter (HOSPITAL_COMMUNITY): Payer: Self-pay

## 2012-12-02 ENCOUNTER — Encounter (HOSPITAL_COMMUNITY): Payer: Self-pay

## 2012-12-03 ENCOUNTER — Encounter (HOSPITAL_COMMUNITY): Payer: Self-pay

## 2012-12-07 ENCOUNTER — Encounter (HOSPITAL_COMMUNITY)
Admission: RE | Admit: 2012-12-07 | Discharge: 2012-12-07 | Disposition: A | Discharge status: Home / Self Care | Payer: Self-pay | Source: Ambulatory Visit | Attending: Cardiovascular Disease | Admitting: Cardiovascular Disease

## 2012-12-09 ENCOUNTER — Encounter (HOSPITAL_COMMUNITY)
Admission: RE | Admit: 2012-12-09 | Discharge: 2012-12-09 | Disposition: A | Discharge status: Home / Self Care | Payer: Self-pay | Source: Ambulatory Visit | Attending: Cardiovascular Disease | Admitting: Cardiovascular Disease

## 2012-12-10 ENCOUNTER — Encounter (HOSPITAL_COMMUNITY)
Admission: RE | Admit: 2012-12-10 | Discharge: 2012-12-10 | Disposition: A | Discharge status: Home / Self Care | Payer: Self-pay | Source: Ambulatory Visit | Attending: Cardiovascular Disease | Admitting: Cardiovascular Disease

## 2012-12-14 ENCOUNTER — Encounter (HOSPITAL_COMMUNITY)
Admission: RE | Admit: 2012-12-14 | Discharge: 2012-12-14 | Disposition: A | Discharge status: Home / Self Care | Payer: Self-pay | Source: Ambulatory Visit | Attending: Cardiovascular Disease | Admitting: Cardiovascular Disease

## 2012-12-16 ENCOUNTER — Encounter (HOSPITAL_COMMUNITY)
Admission: RE | Admit: 2012-12-16 | Discharge: 2012-12-16 | Disposition: A | Discharge status: Home / Self Care | Payer: Self-pay | Source: Ambulatory Visit | Attending: Cardiovascular Disease | Admitting: Cardiovascular Disease

## 2012-12-17 ENCOUNTER — Encounter (HOSPITAL_COMMUNITY)
Admission: RE | Admit: 2012-12-17 | Discharge: 2012-12-17 | Disposition: A | Discharge status: Home / Self Care | Payer: Self-pay | Source: Ambulatory Visit | Attending: Cardiovascular Disease | Admitting: Cardiovascular Disease

## 2012-12-20 ENCOUNTER — Other Ambulatory Visit: Payer: Self-pay | Admitting: Internal Medicine

## 2012-12-21 ENCOUNTER — Encounter (HOSPITAL_COMMUNITY)
Admission: RE | Admit: 2012-12-21 | Discharge: 2012-12-21 | Disposition: A | Discharge status: Home / Self Care | Payer: Self-pay | Source: Ambulatory Visit | Attending: Cardiovascular Disease | Admitting: Cardiovascular Disease

## 2012-12-21 DIAGNOSIS — I471 Supraventricular tachycardia, unspecified: Secondary | ICD-10-CM | POA: Insufficient documentation

## 2012-12-21 DIAGNOSIS — E785 Hyperlipidemia, unspecified: Secondary | ICD-10-CM | POA: Insufficient documentation

## 2012-12-21 DIAGNOSIS — Z5189 Encounter for other specified aftercare: Secondary | ICD-10-CM | POA: Insufficient documentation

## 2012-12-21 DIAGNOSIS — I059 Rheumatic mitral valve disease, unspecified: Secondary | ICD-10-CM | POA: Insufficient documentation

## 2012-12-21 DIAGNOSIS — I252 Old myocardial infarction: Secondary | ICD-10-CM | POA: Insufficient documentation

## 2012-12-21 DIAGNOSIS — Z7901 Long term (current) use of anticoagulants: Secondary | ICD-10-CM | POA: Insufficient documentation

## 2012-12-21 DIAGNOSIS — I10 Essential (primary) hypertension: Secondary | ICD-10-CM | POA: Insufficient documentation

## 2012-12-21 DIAGNOSIS — I5181 Takotsubo syndrome: Secondary | ICD-10-CM | POA: Insufficient documentation

## 2012-12-23 ENCOUNTER — Encounter (HOSPITAL_COMMUNITY)
Admission: RE | Admit: 2012-12-23 | Discharge: 2012-12-23 | Disposition: A | Discharge status: Home / Self Care | Payer: Self-pay | Source: Ambulatory Visit | Attending: Cardiovascular Disease | Admitting: Cardiovascular Disease

## 2012-12-24 ENCOUNTER — Encounter (HOSPITAL_COMMUNITY)
Admission: RE | Admit: 2012-12-24 | Discharge: 2012-12-24 | Disposition: A | Discharge status: Home / Self Care | Payer: Self-pay | Source: Ambulatory Visit | Attending: Cardiovascular Disease | Admitting: Cardiovascular Disease

## 2012-12-28 ENCOUNTER — Other Ambulatory Visit (INDEPENDENT_AMBULATORY_CARE_PROVIDER_SITE_OTHER): Payer: Medicare Other

## 2012-12-28 ENCOUNTER — Other Ambulatory Visit: Payer: Self-pay | Admitting: Cardiovascular Disease

## 2012-12-28 ENCOUNTER — Encounter (HOSPITAL_COMMUNITY)
Admission: RE | Admit: 2012-12-28 | Discharge: 2012-12-28 | Disposition: A | Discharge status: Home / Self Care | Payer: Self-pay | Source: Ambulatory Visit | Attending: Cardiovascular Disease | Admitting: Cardiovascular Disease

## 2012-12-28 ENCOUNTER — Ambulatory Visit (INDEPENDENT_AMBULATORY_CARE_PROVIDER_SITE_OTHER): Payer: Medicare Other | Admitting: Internal Medicine

## 2012-12-28 ENCOUNTER — Encounter: Payer: Self-pay | Admitting: Internal Medicine

## 2012-12-28 VITALS — BP 124/74 | HR 72 | Ht 67.5 in | Wt 121.1 lb

## 2012-12-28 DIAGNOSIS — K5190 Ulcerative colitis, unspecified, without complications: Secondary | ICD-10-CM

## 2012-12-28 LAB — CBC WITH DIFFERENTIAL/PLATELET
Basophils Absolute: 0 10*3/uL (ref 0.0–0.1)
Eosinophils Relative: 0.6 % (ref 0.0–5.0)
Hemoglobin: 13.3 g/dL (ref 12.0–15.0)
Lymphocytes Relative: 12.7 % (ref 12.0–46.0)
Monocytes Relative: 6.4 % (ref 3.0–12.0)
Neutro Abs: 6.2 10*3/uL (ref 1.4–7.7)
Platelets: 290 10*3/uL (ref 150.0–400.0)
RDW: 14.9 % — ABNORMAL HIGH (ref 11.5–14.6)
WBC: 7.7 10*3/uL (ref 4.5–10.5)

## 2012-12-28 MED ORDER — PREDNISONE 5 MG PO TABS: 5.0000 mg | ORAL_TABLET | ORAL | Status: DC

## 2012-12-28 NOTE — Progress Notes (Signed)
Kirsten Rice 1946/07/14 MRN [id number]        History of Present Illness:  This is a 67 year old white female with inflammatory bowel disease and chronic diarrhea. She's had ulcerative colitis for over 20 years. On recent colonoscopy October 2013 she had a minimal active chronic colitis with intramural granulomas which suggested Crohn's colitis. She had a recent flareup of rectal bleeding and diarrhea which has responded to prednisone taper. She started at 20 mg a day 4 weeks ago and is down to 10 mg currently. Her diarrhea has resolved and her rectal bleeding has stopped. Her C. difficile toxin was negative.   Past Medical History  Diagnosis Date  . Takotsubo cardiomyopathy 2007  . Raynaud's disease   . Mitral valve prolapse   . Ulcerative colitis   . Deep vein thrombosis   . Telangiectasia   . Hyperthyroidism   . GERD (gastroesophageal reflux disease)   . Cardiomyopathy   . Myocardial infarction    Past Surgical History  Procedure Laterality Date  . Shoulder surgery  1996    right  . Tubal ligation  1973  . Appendectomy  1971  . Colonoscopy    . Colonoscopy  05/11/2012    Procedure: COLONOSCOPY;  Surgeon: Hart Carwin, MD;  Location: [facility];  Service: Endoscopy;  Laterality: N/A;    reports that she has never smoked. She has never used smokeless tobacco. She reports that she drinks about 4.2 ounces of alcohol per week. She reports that she does not use illicit drugs. family history includes Aortic stenosis in her father and Diabetes in her sisters.  There is no history of Colon cancer. Allergies  Allergen Reactions  . Compazine (Prochlorperazine Edisylate) Other (See Comments)    paralysis of face  . Prochlorperazine Edisylate     REACTION: extrapyrimadal syndrome  . Tetanus-Diphtheria Toxoids Td     REACTION: arm swells        Review of Systems: Denies upper GI symptoms chest pain or heartburn  The remainder of the 10 point ROS is negative except as  outlined in H&P   Physical Exam: General appearance thin, in no distress. Eyes- non icteric. HEENT nontraumatic, normocephalic. Mouth no lesions, tongue papillated, no cheilosis. Neck supple without adenopathy, thyroid not enlarged, no carotid bruits, no JVD. Lungs Clear to auscultation bilaterally. Cor normal S1, normal S2, regular rhythm, no murmur,  quiet precordium. Abdomen: Of nontender with hyperactive bowel sounds. No distention. No palpable mass Rectal: Not done Extremities no pedal edema. Skin no lesions. Neurological alert and oriented x 3. Psychological normal mood and affect.  Assessment and Plan:  67 year old white female with the inflammatory bowel disease initially treated as ulcerative colitis but currently leaning toward diagnosis Crohn's colitis. We will obtain IBD markers. And continue prednisone taper. She will stay on Colazal 750 mg 4 tablets twice a day. We will check her CBC today. I will see her in 6-8 weeks   12/28/2012 Kirsten Rice

## 2012-12-28 NOTE — Patient Instructions (Addendum)
Your physician has requested that you go to the basement for the following lab work before leaving today: CBC, IBD serologies  We have sent the following medications to your pharmacy for you to pick up at your convenience: Prednisone   Richard Tisovec M.D.

## 2012-12-30 ENCOUNTER — Encounter (HOSPITAL_COMMUNITY)
Admission: RE | Admit: 2012-12-30 | Discharge: 2012-12-30 | Disposition: A | Discharge status: Home / Self Care | Payer: Self-pay | Source: Ambulatory Visit | Attending: Cardiovascular Disease | Admitting: Cardiovascular Disease

## 2012-12-30 LAB — IBD EXPANDED PANEL
ALCA: 27 units (ref 0–60)
AMCA: 66 units (ref 0–100)
Atypical pANCA: NEGATIVE
gASCA: 15 units (ref 0–50)

## 2012-12-31 ENCOUNTER — Encounter (HOSPITAL_COMMUNITY)
Admission: RE | Admit: 2012-12-31 | Discharge: 2012-12-31 | Disposition: A | Discharge status: Home / Self Care | Payer: Self-pay | Source: Ambulatory Visit | Attending: Cardiovascular Disease | Admitting: Cardiovascular Disease

## 2013-01-04 ENCOUNTER — Encounter (HOSPITAL_COMMUNITY)
Admission: RE | Admit: 2013-01-04 | Discharge: 2013-01-04 | Disposition: A | Discharge status: Home / Self Care | Payer: Self-pay | Source: Ambulatory Visit | Attending: Cardiovascular Disease | Admitting: Cardiovascular Disease

## 2013-01-06 ENCOUNTER — Encounter (HOSPITAL_COMMUNITY)
Admission: RE | Admit: 2013-01-06 | Discharge: 2013-01-06 | Disposition: A | Discharge status: Home / Self Care | Payer: Self-pay | Source: Ambulatory Visit | Attending: Cardiovascular Disease | Admitting: Cardiovascular Disease

## 2013-01-07 ENCOUNTER — Encounter (HOSPITAL_COMMUNITY)
Admission: RE | Admit: 2013-01-07 | Discharge: 2013-01-07 | Disposition: A | Discharge status: Home / Self Care | Payer: Self-pay | Source: Ambulatory Visit | Attending: Cardiovascular Disease | Admitting: Cardiovascular Disease

## 2013-01-11 ENCOUNTER — Encounter (HOSPITAL_COMMUNITY)
Admission: RE | Admit: 2013-01-11 | Discharge: 2013-01-11 | Disposition: A | Discharge status: Home / Self Care | Payer: Self-pay | Source: Ambulatory Visit | Attending: Cardiovascular Disease | Admitting: Cardiovascular Disease

## 2013-01-13 ENCOUNTER — Encounter (HOSPITAL_COMMUNITY)
Admission: RE | Admit: 2013-01-13 | Discharge: 2013-01-13 | Disposition: A | Discharge status: Home / Self Care | Payer: Self-pay | Source: Ambulatory Visit | Attending: Cardiovascular Disease | Admitting: Cardiovascular Disease

## 2013-01-14 ENCOUNTER — Encounter (HOSPITAL_COMMUNITY)
Admission: RE | Admit: 2013-01-14 | Discharge: 2013-01-14 | Disposition: A | Discharge status: Home / Self Care | Payer: Self-pay | Source: Ambulatory Visit | Attending: Cardiovascular Disease | Admitting: Cardiovascular Disease

## 2013-01-18 ENCOUNTER — Encounter (HOSPITAL_COMMUNITY)
Admission: RE | Admit: 2013-01-18 | Discharge: 2013-01-18 | Disposition: A | Discharge status: Home / Self Care | Payer: Self-pay | Source: Ambulatory Visit | Attending: Cardiovascular Disease | Admitting: Cardiovascular Disease

## 2013-01-18 DIAGNOSIS — Z5189 Encounter for other specified aftercare: Secondary | ICD-10-CM | POA: Insufficient documentation

## 2013-01-18 DIAGNOSIS — I059 Rheumatic mitral valve disease, unspecified: Secondary | ICD-10-CM | POA: Insufficient documentation

## 2013-01-18 DIAGNOSIS — E785 Hyperlipidemia, unspecified: Secondary | ICD-10-CM | POA: Insufficient documentation

## 2013-01-18 DIAGNOSIS — I5181 Takotsubo syndrome: Secondary | ICD-10-CM | POA: Insufficient documentation

## 2013-01-18 DIAGNOSIS — I252 Old myocardial infarction: Secondary | ICD-10-CM | POA: Insufficient documentation

## 2013-01-18 DIAGNOSIS — I471 Supraventricular tachycardia, unspecified: Secondary | ICD-10-CM | POA: Insufficient documentation

## 2013-01-18 DIAGNOSIS — I10 Essential (primary) hypertension: Secondary | ICD-10-CM | POA: Insufficient documentation

## 2013-01-18 DIAGNOSIS — Z7901 Long term (current) use of anticoagulants: Secondary | ICD-10-CM | POA: Insufficient documentation

## 2013-01-19 ENCOUNTER — Other Ambulatory Visit: Payer: Self-pay | Admitting: Cardiovascular Disease

## 2013-01-19 ENCOUNTER — Encounter (HOSPITAL_COMMUNITY)
Admission: RE | Admit: 2013-01-19 | Discharge: 2013-01-19 | Disposition: A | Discharge status: Home / Self Care | Payer: Self-pay | Source: Ambulatory Visit | Attending: Cardiovascular Disease | Admitting: Cardiovascular Disease

## 2013-01-20 ENCOUNTER — Encounter (HOSPITAL_COMMUNITY)
Admission: RE | Admit: 2013-01-20 | Discharge: 2013-01-20 | Disposition: A | Discharge status: Home / Self Care | Payer: Self-pay | Source: Ambulatory Visit | Attending: Cardiovascular Disease | Admitting: Cardiovascular Disease

## 2013-01-24 ENCOUNTER — Telehealth: Payer: Self-pay | Admitting: Internal Medicine

## 2013-01-24 ENCOUNTER — Encounter: Payer: Self-pay | Admitting: Internal Medicine

## 2013-01-24 ENCOUNTER — Other Ambulatory Visit (INDEPENDENT_AMBULATORY_CARE_PROVIDER_SITE_OTHER): Payer: Medicare Other

## 2013-01-24 ENCOUNTER — Ambulatory Visit (INDEPENDENT_AMBULATORY_CARE_PROVIDER_SITE_OTHER): Payer: Medicare Other | Admitting: Internal Medicine

## 2013-01-24 VITALS — BP 118/68 | Ht 67.5 in | Wt 120.3 lb

## 2013-01-24 DIAGNOSIS — R197 Diarrhea, unspecified: Secondary | ICD-10-CM

## 2013-01-24 DIAGNOSIS — K5190 Ulcerative colitis, unspecified, without complications: Secondary | ICD-10-CM

## 2013-01-24 LAB — CBC WITH DIFFERENTIAL/PLATELET
Basophils Absolute: 0 10*3/uL (ref 0.0–0.1)
Eosinophils Absolute: 0 10*3/uL (ref 0.0–0.7)
MCHC: 34 g/dL (ref 30.0–36.0)
MCV: 94.1 fl (ref 78.0–100.0)
Monocytes Absolute: 0.2 10*3/uL (ref 0.1–1.0)
Neutrophils Relative %: 83.6 % — ABNORMAL HIGH (ref 43.0–77.0)
Platelets: 247 10*3/uL (ref 150.0–400.0)

## 2013-01-24 MED ORDER — INTEGRA 62.5-62.5-40-3 MG PO CAPS: 1.0000 | ORAL_CAPSULE | Freq: Every day | ORAL | Status: DC

## 2013-01-24 MED ORDER — PREDNISONE 10 MG PO TABS: 10.0000 mg | ORAL_TABLET | ORAL | Status: DC

## 2013-01-24 NOTE — Progress Notes (Signed)
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 Her last colonoscopy was completed on 05/11/2012 and confirmed pancolitis.. She had a flareup of colitis in April and was on a prednisone taper but had another flareup last week when she tapered down to 5 mg a day. She describes several episodes of bloody stools. Her hemoglobin dropped from 13 to10.3 today. She has been on colazal 750 mg 8 tablets a day. She was on Remicade in the past but this was discontinued several years ago. She had a flu vaccine and pneumococcal vaccine by Kirsten.Tisovec in 2013. She states that she had a bone density in the past as well, however, after contacting [facility]irsten Rice office, we have been notified that her last bone density was prior to 2010. Therefore, she will likely need an updated one.  Past Medical History  Diagnosis Date  . Takotsubo cardiomyopathy 2007  . Raynaud's disease   . Mitral valve prolapse   . Ulcerative colitis   . Deep vein thrombosis   . Telangiectasia   . Hyperthyroidism   . GERD (gastroesophageal reflux disease)   . Cardiomyopathy   . Myocardial infarction    Past Surgical History  Procedure Laterality Date  . Shoulder surgery  1996    right  . Tubal ligation  1973  . Appendectomy  1971  . Colonoscopy    . Colonoscopy  05/11/2012    Procedure: COLONOSCOPY;  Surgeon: Kirsten Carwin, MD;  Location: [facility];  Service: Endoscopy;  Laterality: N/A;    reports that she has never smoked. She has never used smokeless tobacco. She reports that she drinks about 4.2 ounces of alcohol per week. She reports that she does not use illicit drugs. family history includes Aortic stenosis in her father and Diabetes in her sisters.  There is no history of Colon cancer. Allergies  Allergen Reactions  . Compazine (Prochlorperazine Edisylate) Other (See Comments)    paralysis of face  .  Prochlorperazine Edisylate     REACTION: extrapyrimadal syndrome  . Tetanus-Diphtheria Toxoids Td     REACTION: arm swells        Review of Systems:Denies dysphagia odynophagia. Positive for diarrhea  The remainder of the 10 point ROS is negative except as outlined in H&P   Physical Exam: General appearance  Well developed, in no distress. Eyes- non icteric. HEENT nontraumatic, normocephalic. Mouth no lesions, tongue papillated, no cheilosis. Neck supple without adenopathy, thyroid not enlarged, no carotid bruits, no JVD. Lungs Clear to auscultation bilaterally. Cor normal S1, normal S2, regular rhythm, no murmur,  quiet precordium. Abdomen: Soft nontender with normoactive bowel sounds. No distention. No palpable mass. Rectal:And anoscopic exam reveals tender anal canal with spasm. Small first grade hemorrhoids internally without signs of bleeding. Stool is brown Hemoccult negative.  Extremities no pedal edema. Skin no lesions. Neurological alert and oriented x 3. Psychological normal mood and affect.  Assessment and Plan:  Problem #17 67 year old white female with ulcerative pancolitis with a recent flareup which responded initially to a prednisone taper but had another flareup as of last week.Her Hgb dropped from 13.0 3 weeks ago to 10.3 today We will have to increase her prednisone back to 30 mg daily and we have discussed the use of immunomodulators or biologicals. She is asking to try a prednisone taper before considering biologicals. I have given her samples of iron supplements and refilled her  prednisone. She has an appointment with me in 2 weeks. If we consider biologicals, she would need to have a TB skin test which was last done 4 years ago.   01/24/2013 Kirsten Rice

## 2013-01-24 NOTE — Telephone Encounter (Signed)
Per Dr. Juanda Chance, OV today with CBC prior. Spoke with patient and scheduled her today at 2:15 PM.

## 2013-01-24 NOTE — Telephone Encounter (Signed)
Patient calling to report bright, red bloody diarrhea this weekend. States this "just comes out of the blue." States she had diarrhea with blood 4 times on Saturday and 2 times on Sunday. Denies any pain. She has had 2 bowel movements today without visible blood. Reports she did feel light headed and weak over the weekend. She is on Prednisone 5 mg and Colazal 750 mg 8 tablets/day. She is not on blood thinners but does take Aspirin 81 mg/day. Please, advise,. ?lab ? OV

## 2013-01-24 NOTE — Patient Instructions (Addendum)
We have sent the following medications to your pharmacy for you to pick up at your convenience: Prednisone  We have given you samples of the following medication to take: Integra  Please follow up with Dr Juanda Chance in 2 weeks.  CC: Dr Guerry Bruin

## 2013-01-25 ENCOUNTER — Encounter (HOSPITAL_COMMUNITY): Admission: RE | Admit: 2013-01-25 | Payer: Self-pay | Source: Ambulatory Visit

## 2013-01-26 ENCOUNTER — Other Ambulatory Visit: Payer: Medicare Other

## 2013-01-27 ENCOUNTER — Encounter (HOSPITAL_COMMUNITY)
Admission: RE | Admit: 2013-01-27 | Discharge: 2013-01-27 | Disposition: A | Discharge status: Home / Self Care | Payer: Self-pay | Source: Ambulatory Visit | Attending: Cardiovascular Disease | Admitting: Cardiovascular Disease

## 2013-01-27 ENCOUNTER — Ambulatory Visit (INDEPENDENT_AMBULATORY_CARE_PROVIDER_SITE_OTHER)
Admission: RE | Admit: 2013-01-27 | Discharge: 2013-01-27 | Disposition: A | Discharge status: Home / Self Care | Payer: Medicare Other | Source: Ambulatory Visit | Attending: Internal Medicine | Admitting: Internal Medicine

## 2013-01-27 DIAGNOSIS — K5190 Ulcerative colitis, unspecified, without complications: Secondary | ICD-10-CM

## 2013-01-28 ENCOUNTER — Encounter (HOSPITAL_COMMUNITY)
Admission: RE | Admit: 2013-01-28 | Discharge: 2013-01-28 | Disposition: A | Discharge status: Home / Self Care | Payer: Self-pay | Source: Ambulatory Visit | Attending: Cardiovascular Disease | Admitting: Cardiovascular Disease

## 2013-02-01 ENCOUNTER — Other Ambulatory Visit: Payer: Self-pay | Admitting: *Deleted

## 2013-02-01 ENCOUNTER — Encounter (HOSPITAL_COMMUNITY)
Admission: RE | Admit: 2013-02-01 | Discharge: 2013-02-01 | Disposition: A | Discharge status: Home / Self Care | Payer: Self-pay | Source: Ambulatory Visit | Attending: Cardiovascular Disease | Admitting: Cardiovascular Disease

## 2013-02-03 ENCOUNTER — Encounter (HOSPITAL_COMMUNITY)
Admission: RE | Admit: 2013-02-03 | Discharge: 2013-02-03 | Disposition: A | Discharge status: Home / Self Care | Payer: Self-pay | Source: Ambulatory Visit | Attending: Cardiovascular Disease | Admitting: Cardiovascular Disease

## 2013-02-04 ENCOUNTER — Encounter (HOSPITAL_COMMUNITY)
Admission: RE | Admit: 2013-02-04 | Discharge: 2013-02-04 | Disposition: A | Discharge status: Home / Self Care | Payer: Self-pay | Source: Ambulatory Visit | Attending: Cardiovascular Disease | Admitting: Cardiovascular Disease

## 2013-02-08 ENCOUNTER — Encounter: Payer: Self-pay | Admitting: Internal Medicine

## 2013-02-08 ENCOUNTER — Encounter (HOSPITAL_COMMUNITY)
Admission: RE | Admit: 2013-02-08 | Discharge: 2013-02-08 | Disposition: A | Discharge status: Home / Self Care | Payer: Self-pay | Source: Ambulatory Visit | Attending: Cardiovascular Disease | Admitting: Cardiovascular Disease

## 2013-02-08 ENCOUNTER — Ambulatory Visit (INDEPENDENT_AMBULATORY_CARE_PROVIDER_SITE_OTHER): Payer: Medicare Other | Admitting: Internal Medicine

## 2013-02-08 ENCOUNTER — Other Ambulatory Visit (INDEPENDENT_AMBULATORY_CARE_PROVIDER_SITE_OTHER): Payer: Medicare Other

## 2013-02-08 VITALS — BP 122/74 | HR 80 | Ht 67.5 in | Wt 123.4 lb

## 2013-02-08 DIAGNOSIS — K5190 Ulcerative colitis, unspecified, without complications: Secondary | ICD-10-CM

## 2013-02-08 DIAGNOSIS — K5090 Crohn's disease, unspecified, without complications: Secondary | ICD-10-CM

## 2013-02-08 DIAGNOSIS — D849 Immunodeficiency, unspecified: Secondary | ICD-10-CM

## 2013-02-08 LAB — CBC WITH DIFFERENTIAL/PLATELET
Basophils Absolute: 0 10*3/uL (ref 0.0–0.1)
HCT: 35.8 % — ABNORMAL LOW (ref 36.0–46.0)
Hemoglobin: 11.9 g/dL — ABNORMAL LOW (ref 12.0–15.0)
Lymphs Abs: 0.4 10*3/uL — ABNORMAL LOW (ref 0.7–4.0)
MCV: 93.9 fl (ref 78.0–100.0)
Monocytes Absolute: 0.1 10*3/uL (ref 0.1–1.0)
Monocytes Relative: 0.9 % — ABNORMAL LOW (ref 3.0–12.0)
Neutro Abs: 8.7 10*3/uL — ABNORMAL HIGH (ref 1.4–7.7)
RDW: 14.1 % (ref 11.5–14.6)

## 2013-02-08 LAB — HEPATITIS B SURFACE ANTIGEN: Hepatitis B Surface Ag: NEGATIVE

## 2013-02-08 NOTE — Patient Instructions (Addendum)
Your physician has requested that you go to the basement for the following lab work before leaving today: Hepatitis A and B serologies, CBC  Please decrease your prednisone dosage to 20 mg daily until Humira induction.  Please increase your calcium to 1200 mg daily.  We will begin the proccess to get you started on Humira pending negative TB test, negative labs and insurance approval.  We have given you a TB skin test today. Please make certain to come back to the office for a reading between 48-72 hours from now to avoid requiring repeat testing. If you choose to have someone else read this test, they must have a current license and fax the result to [phone].  CC: Dr Guerry Bruin

## 2013-02-08 NOTE — Progress Notes (Signed)
Kirsten Rice 11-07-1945 MRN [id number]    History of Present Illness:  This is a 67 year old white female with inflammatory bowel disease involving her entire colon. She has been diagnosed as ulcerative colitis but there has been some doubt as to whether this could be Crohn's disease because she has pseudopolyps.. She has never had involvement of the small bowel. Her IBD markers are negative. She had pancolitis as per her recent colonoscopy in October 2013 and a flareup of disease in April 2014 necessitating a prednisone taper. She did have a breakthrough flareup when she decreased her prednisone to 5 mg a day. Her hemoglobin recently dropped from 13 to10 g. She complains of large-volume bleeding. We have increased the prednisone to 30 mg a day. She has been on it for 2 weeks. She is currently asymptomatic. We are considering starting biologicals. She was on Remicade in the early 2000's for about 6 months but had to discontinue it because of poor tolerance. She had a recent bone density indicating osteopenia. She had her pneumococcal vaccine in September 2013.She had a pneumovax and Flu vaccine.. She thinks she had a hepatitis B vaccine but we will check her status before starting biologicals.    Past Medical History  Diagnosis Date  . Takotsubo cardiomyopathy 2007  . Raynaud's disease   . Mitral valve prolapse   . Ulcerative colitis   . Deep vein thrombosis   . Telangiectasia   . Hyperthyroidism   . GERD (gastroesophageal reflux disease)   . Cardiomyopathy   . Myocardial infarction    Past Surgical History  Procedure Laterality Date  . Shoulder surgery  1996    right  . Tubal ligation  1973  . Appendectomy  1971  . Colonoscopy    . Colonoscopy  05/11/2012    Procedure: COLONOSCOPY;  Surgeon: Hart Carwin, MD;  Location: [facility];  Service: Endoscopy;  Laterality: N/A;    reports that she has never smoked. She has never used smokeless tobacco. She reports that she drinks about  4.2 ounces of alcohol per week. She reports that she does not use illicit drugs. family history includes Aortic stenosis in her father and Diabetes in her sisters.  There is no history of Colon cancer. Allergies  Allergen Reactions  . Compazine (Prochlorperazine Edisylate) Other (See Comments)    paralysis of face  . Prochlorperazine Edisylate     REACTION: extrapyrimadal syndrome  . Tetanus-Diphtheria Toxoids Td     REACTION: arm swells        Review of Systems: Denies abdominal pain rectal bleeding but has decreased level of energy  The remainder of the 10 point ROS is negative except as outlined in H&P   Physical Exam: General appearance  Well developed, in no distress. Psychological normal mood and affect.  Assessment and Plan:  Problem #6 67 year old white female with long standing inflammatory bowel disease  Since 1970, never requiring hodpitalization.which has recently been active. Patient is a good candidate for biologicals. We had a long discussion concerning side effects, risks and benefits of biological therapy. She was briefly on Remicade about 10 years ago. I would like to start her on a Humira induction regimen with a maintenance dose of 40 mg every 2 weeks. She is an [employer] and can inject herself. Because of the side effects of prednisone, we will decrease her dose to 20 mg daily. We will also have her to continue her Colazal at 8 tablets a day. Her bone density showed osteopenia and  so she will increase her calcium intake to 1200 mg daily to take with vitamin D. We will request preauthorization for Humira. We will also check her hepatitis B and A serologies as well as her CBC today   02/08/2013 Kirsten Rice

## 2013-02-09 LAB — HEPATITIS A ANTIBODY, TOTAL: Hep A Total Ab: NEGATIVE

## 2013-02-10 ENCOUNTER — Encounter (HOSPITAL_COMMUNITY)
Admission: RE | Admit: 2013-02-10 | Discharge: 2013-02-10 | Disposition: A | Discharge status: Home / Self Care | Payer: Self-pay | Source: Ambulatory Visit | Attending: Cardiovascular Disease | Admitting: Cardiovascular Disease

## 2013-02-11 ENCOUNTER — Encounter (HOSPITAL_COMMUNITY)
Admission: RE | Admit: 2013-02-11 | Discharge: 2013-02-11 | Disposition: A | Discharge status: Home / Self Care | Payer: Self-pay | Source: Ambulatory Visit | Attending: Cardiovascular Disease | Admitting: Cardiovascular Disease

## 2013-02-15 ENCOUNTER — Encounter (HOSPITAL_COMMUNITY)
Admission: RE | Admit: 2013-02-15 | Discharge: 2013-02-15 | Disposition: A | Discharge status: Home / Self Care | Payer: Self-pay | Source: Ambulatory Visit | Attending: Cardiovascular Disease | Admitting: Cardiovascular Disease

## 2013-02-17 ENCOUNTER — Encounter (HOSPITAL_COMMUNITY)
Admission: RE | Admit: 2013-02-17 | Discharge: 2013-02-17 | Disposition: A | Discharge status: Home / Self Care | Payer: Self-pay | Source: Ambulatory Visit | Attending: Cardiovascular Disease | Admitting: Cardiovascular Disease

## 2013-02-18 ENCOUNTER — Encounter (HOSPITAL_COMMUNITY)
Admission: RE | Admit: 2013-02-18 | Discharge: 2013-02-18 | Disposition: A | Discharge status: Home / Self Care | Payer: Self-pay | Source: Ambulatory Visit | Attending: Cardiovascular Disease | Admitting: Cardiovascular Disease

## 2013-02-18 DIAGNOSIS — I252 Old myocardial infarction: Secondary | ICD-10-CM | POA: Insufficient documentation

## 2013-02-18 DIAGNOSIS — E785 Hyperlipidemia, unspecified: Secondary | ICD-10-CM | POA: Insufficient documentation

## 2013-02-18 DIAGNOSIS — I5181 Takotsubo syndrome: Secondary | ICD-10-CM | POA: Insufficient documentation

## 2013-02-18 DIAGNOSIS — Z7901 Long term (current) use of anticoagulants: Secondary | ICD-10-CM | POA: Insufficient documentation

## 2013-02-18 DIAGNOSIS — I10 Essential (primary) hypertension: Secondary | ICD-10-CM | POA: Insufficient documentation

## 2013-02-18 DIAGNOSIS — Z5189 Encounter for other specified aftercare: Secondary | ICD-10-CM | POA: Insufficient documentation

## 2013-02-18 DIAGNOSIS — I059 Rheumatic mitral valve disease, unspecified: Secondary | ICD-10-CM | POA: Insufficient documentation

## 2013-02-22 ENCOUNTER — Encounter (HOSPITAL_COMMUNITY)
Admission: RE | Admit: 2013-02-22 | Discharge: 2013-02-22 | Disposition: A | Discharge status: Home / Self Care | Payer: Self-pay | Source: Ambulatory Visit | Attending: Cardiovascular Disease | Admitting: Cardiovascular Disease

## 2013-02-23 ENCOUNTER — Other Ambulatory Visit: Payer: Self-pay

## 2013-02-24 ENCOUNTER — Encounter (HOSPITAL_COMMUNITY)
Admission: RE | Admit: 2013-02-24 | Discharge: 2013-02-24 | Disposition: A | Discharge status: Home / Self Care | Payer: Self-pay | Source: Ambulatory Visit | Attending: Cardiovascular Disease | Admitting: Cardiovascular Disease

## 2013-02-25 ENCOUNTER — Encounter (HOSPITAL_COMMUNITY)
Admission: RE | Admit: 2013-02-25 | Discharge: 2013-02-25 | Disposition: A | Discharge status: Home / Self Care | Payer: Self-pay | Source: Ambulatory Visit | Attending: Cardiovascular Disease | Admitting: Cardiovascular Disease

## 2013-02-28 ENCOUNTER — Encounter (HOSPITAL_COMMUNITY)
Admission: RE | Admit: 2013-02-28 | Discharge: 2013-02-28 | Disposition: A | Discharge status: Home / Self Care | Payer: Self-pay | Source: Ambulatory Visit | Attending: Cardiovascular Disease | Admitting: Cardiovascular Disease

## 2013-03-01 ENCOUNTER — Encounter (HOSPITAL_COMMUNITY)
Admission: RE | Admit: 2013-03-01 | Discharge: 2013-03-01 | Disposition: A | Discharge status: Home / Self Care | Payer: Self-pay | Source: Ambulatory Visit | Attending: Cardiovascular Disease | Admitting: Cardiovascular Disease

## 2013-03-01 ENCOUNTER — Telehealth: Payer: Self-pay | Admitting: *Deleted

## 2013-03-01 LAB — TB SKIN TEST: Induration: 0 mm

## 2013-03-01 MED ORDER — ADALIMUMAB 40 MG/0.8ML ~~LOC~~ KIT: PACK | SUBCUTANEOUS | Status: DC

## 2013-03-01 MED ORDER — ADALIMUMAB 40 MG/0.8ML ~~LOC~~ KIT: 40.0000 mg | PACK | SUBCUTANEOUS | Status: DC

## 2013-03-01 NOTE — Telephone Encounter (Signed)
Patient's TB skin test was negative. I have sent Humira prescriptions to [facility] and have contacted patient. Patient states that after reading all possible side effects of Humira (as well as other biologicals), she is extremly leery of starting the medication due to previous skin cancers etc. She would like to talk to Dr Juanda Chance again in detail before starting on Humira. Dr Juanda Chance, patient states that she will be out of the house from 11 am-5 pm today (03/01/13) and will be at home tomorrow morning if you can call her.

## 2013-03-03 ENCOUNTER — Encounter (HOSPITAL_COMMUNITY): Payer: Self-pay

## 2013-03-03 NOTE — Telephone Encounter (Signed)
I cannot talk to her this week, too busy in the hospital.

## 2013-03-04 ENCOUNTER — Encounter (HOSPITAL_COMMUNITY): Payer: Self-pay

## 2013-03-04 NOTE — Telephone Encounter (Signed)
Left message advising patient that Dr Juanda Chance is hospital doctor this week and has not had a chance to speak with her regarding Humira. Advised that Dr Juanda Chance would speak to her at a later time regarding her detailed questions about Humira although it would not be this week.

## 2013-03-04 NOTE — Telephone Encounter (Signed)
I have spoken to patient. We were actually advised today from our Humira representative that there is a new Doctor, hospital" program for Humira patients in which an [place] representative will make sure that patients insurance is checked, patient is given Humira at the cheapest price and patient is instructed on Humira induction as well as maintanence. Patient has been sent a form to sign and mail to [employer] to start that process before going forward with ordering the medication at $2,500 per month. She will keep me up to date on progress.

## 2013-03-04 NOTE — Telephone Encounter (Signed)
I have spoken to the patient at length, she wants to go ahead with the Humira, it will be costly to her even with the insurance $2500.00 for the induction tregimen ( injections) them $1100.00 /month. Please go ahead and initiate the induction. She will come for instructions when you call her. Thanx DB

## 2013-03-08 ENCOUNTER — Encounter (HOSPITAL_COMMUNITY)
Admission: RE | Admit: 2013-03-08 | Discharge: 2013-03-08 | Disposition: A | Discharge status: Home / Self Care | Payer: Self-pay | Source: Ambulatory Visit | Attending: Cardiovascular Disease | Admitting: Cardiovascular Disease

## 2013-03-10 ENCOUNTER — Encounter (HOSPITAL_COMMUNITY)
Admission: RE | Admit: 2013-03-10 | Discharge: 2013-03-10 | Disposition: A | Discharge status: Home / Self Care | Payer: Self-pay | Source: Ambulatory Visit | Attending: Cardiovascular Disease | Admitting: Cardiovascular Disease

## 2013-03-11 ENCOUNTER — Encounter (HOSPITAL_COMMUNITY)
Admission: RE | Admit: 2013-03-11 | Discharge: 2013-03-11 | Disposition: A | Discharge status: Home / Self Care | Payer: Self-pay | Source: Ambulatory Visit | Attending: Cardiovascular Disease | Admitting: Cardiovascular Disease

## 2013-03-11 DIAGNOSIS — G250 Essential tremor: Secondary | ICD-10-CM | POA: Insufficient documentation

## 2013-03-15 ENCOUNTER — Encounter (HOSPITAL_COMMUNITY): Payer: Self-pay

## 2013-03-17 ENCOUNTER — Encounter (HOSPITAL_COMMUNITY)
Admission: RE | Admit: 2013-03-17 | Discharge: 2013-03-17 | Disposition: A | Discharge status: Home / Self Care | Payer: Self-pay | Source: Ambulatory Visit | Attending: Cardiovascular Disease | Admitting: Cardiovascular Disease

## 2013-03-18 ENCOUNTER — Encounter (HOSPITAL_COMMUNITY)
Admission: RE | Admit: 2013-03-18 | Discharge: 2013-03-18 | Disposition: A | Discharge status: Home / Self Care | Payer: Self-pay | Source: Ambulatory Visit | Attending: Cardiovascular Disease | Admitting: Cardiovascular Disease

## 2013-03-18 ENCOUNTER — Other Ambulatory Visit: Payer: Self-pay | Admitting: *Deleted

## 2013-03-18 MED ORDER — PREDNISONE 10 MG PO TABS: 10.0000 mg | ORAL_TABLET | ORAL | Status: DC

## 2013-03-18 NOTE — Telephone Encounter (Signed)
rx sent

## 2013-03-22 ENCOUNTER — Encounter (HOSPITAL_COMMUNITY)
Admission: RE | Admit: 2013-03-22 | Discharge: 2013-03-22 | Disposition: A | Discharge status: Home / Self Care | Payer: Self-pay | Source: Ambulatory Visit | Attending: Cardiovascular Disease | Admitting: Cardiovascular Disease

## 2013-03-22 DIAGNOSIS — Z7901 Long term (current) use of anticoagulants: Secondary | ICD-10-CM | POA: Insufficient documentation

## 2013-03-22 DIAGNOSIS — Z5189 Encounter for other specified aftercare: Secondary | ICD-10-CM | POA: Insufficient documentation

## 2013-03-22 DIAGNOSIS — I252 Old myocardial infarction: Secondary | ICD-10-CM | POA: Insufficient documentation

## 2013-03-22 DIAGNOSIS — I059 Rheumatic mitral valve disease, unspecified: Secondary | ICD-10-CM | POA: Insufficient documentation

## 2013-03-22 DIAGNOSIS — E785 Hyperlipidemia, unspecified: Secondary | ICD-10-CM | POA: Insufficient documentation

## 2013-03-22 DIAGNOSIS — I5181 Takotsubo syndrome: Secondary | ICD-10-CM | POA: Insufficient documentation

## 2013-03-22 DIAGNOSIS — I10 Essential (primary) hypertension: Secondary | ICD-10-CM | POA: Insufficient documentation

## 2013-03-23 ENCOUNTER — Telehealth: Payer: Self-pay | Admitting: Internal Medicine

## 2013-03-23 NOTE — Telephone Encounter (Signed)
Humira and Prednisone need to overlap. We can start tapering the Prednisone once the Humira  has been taken for at least a month or 2 and when her colitis is under control. She will start tapering her Prednisone by 5 mg ever 2 weeks ( she is supposed to be on 30 mg?). I see where she started Humira on 03/01/2013?, then we would consider  To taper her Prednisone earliest in mid September. Let her call us with an update in next 2 weeks.

## 2013-03-23 NOTE — Telephone Encounter (Signed)
Patient has received her shipment of Humira. She is wanting to know what she will do about getting off the Prednisone.She does not want to take both the Humira and Prednisone. Please, advise

## 2013-03-24 ENCOUNTER — Encounter (HOSPITAL_COMMUNITY)
Admission: RE | Admit: 2013-03-24 | Discharge: 2013-03-24 | Disposition: A | Discharge status: Home / Self Care | Payer: Self-pay | Source: Ambulatory Visit | Attending: Cardiovascular Disease | Admitting: Cardiovascular Disease

## 2013-03-24 NOTE — Telephone Encounter (Signed)
Patient given recommendations. She is starting the Humira today 03/24/13. Patient states she is currently on Prednisone 20 mg daily. She states she is not having any bleeding or diarrhea at this time. She is wondering what will help her know the Humira is getting the colitis under control.

## 2013-03-24 NOTE — Telephone Encounter (Signed)
Stay on prednisone 20mg /day  till October 1,2014 ( 4 weeks),then start to taper Prednisone by 5 mg q 2 weeks till gone. Continue  Humira maintenance program 40 mg IM q 2 weeks after completing the induction phase. OV 4-6 weeks.

## 2013-03-24 NOTE — Telephone Encounter (Signed)
Patient given recommendations. Scheduled OV on 05/10/13 at 1:45 PM.

## 2013-03-25 ENCOUNTER — Encounter (HOSPITAL_COMMUNITY): Admission: RE | Admit: 2013-03-25 | Payer: Self-pay | Source: Ambulatory Visit

## 2013-03-29 ENCOUNTER — Encounter (HOSPITAL_COMMUNITY)
Admission: RE | Admit: 2013-03-29 | Discharge: 2013-03-29 | Disposition: A | Discharge status: Home / Self Care | Payer: Self-pay | Source: Ambulatory Visit | Attending: Cardiovascular Disease | Admitting: Cardiovascular Disease

## 2013-03-30 DIAGNOSIS — G43109 Migraine with aura, not intractable, without status migrainosus: Secondary | ICD-10-CM | POA: Insufficient documentation

## 2013-03-31 ENCOUNTER — Encounter (HOSPITAL_COMMUNITY): Payer: Self-pay

## 2013-04-01 ENCOUNTER — Encounter (HOSPITAL_COMMUNITY)
Admission: RE | Admit: 2013-04-01 | Discharge: 2013-04-01 | Disposition: A | Discharge status: Home / Self Care | Payer: Self-pay | Source: Ambulatory Visit | Attending: Cardiovascular Disease | Admitting: Cardiovascular Disease

## 2013-04-05 ENCOUNTER — Other Ambulatory Visit: Payer: Self-pay | Admitting: Obstetrics and Gynecology

## 2013-04-05 ENCOUNTER — Encounter (HOSPITAL_COMMUNITY)
Admission: RE | Admit: 2013-04-05 | Discharge: 2013-04-05 | Disposition: A | Discharge status: Home / Self Care | Payer: Self-pay | Source: Ambulatory Visit | Attending: Cardiovascular Disease | Admitting: Cardiovascular Disease

## 2013-04-05 DIAGNOSIS — R922 Inconclusive mammogram: Secondary | ICD-10-CM

## 2013-04-05 DIAGNOSIS — R9230 Dense breasts, unspecified: Secondary | ICD-10-CM

## 2013-04-07 ENCOUNTER — Encounter (HOSPITAL_COMMUNITY)
Admission: RE | Admit: 2013-04-07 | Discharge: 2013-04-07 | Disposition: A | Discharge status: Home / Self Care | Payer: Self-pay | Source: Ambulatory Visit | Attending: Cardiovascular Disease | Admitting: Cardiovascular Disease

## 2013-04-08 ENCOUNTER — Encounter (HOSPITAL_COMMUNITY)
Admission: RE | Admit: 2013-04-08 | Discharge: 2013-04-08 | Disposition: A | Discharge status: Home / Self Care | Payer: Self-pay | Source: Ambulatory Visit | Attending: Cardiovascular Disease | Admitting: Cardiovascular Disease

## 2013-04-08 ENCOUNTER — Encounter: Payer: Self-pay | Admitting: Neurology

## 2013-04-08 ENCOUNTER — Ambulatory Visit (INDEPENDENT_AMBULATORY_CARE_PROVIDER_SITE_OTHER): Payer: Medicare Other | Admitting: Neurology

## 2013-04-08 VITALS — BP 122/80 | HR 60 | Temp 97.5°F | Resp 14 | Ht 67.0 in | Wt 123.1 lb

## 2013-04-08 DIAGNOSIS — G250 Essential tremor: Secondary | ICD-10-CM

## 2013-04-08 DIAGNOSIS — H539 Unspecified visual disturbance: Secondary | ICD-10-CM

## 2013-04-08 MED ORDER — CLONAZEPAM 0.5 MG PO TABS: 0.5000 mg | ORAL_TABLET | Freq: Two times a day (BID) | ORAL | Status: DC | PRN

## 2013-04-08 NOTE — Patient Instructions (Addendum)
1.  Start klonopin - 0.5 mg - 1/2 tablet at night (to get used to the medication) and 1/2 tablet as needed for tremor 2.  Get copy of labs for me from your oct physical exam 3.  Call me and give me some feedback 4.  Your MRI is scheduled for 10:45am on Wednesday September 24th at [facility]. 5.  I will see you in December, sooner should you need me.

## 2013-04-08 NOTE — Progress Notes (Signed)
Subjective:   Kirsten Rice was seen in consultation in the movement disorder clinic at the request of Gaspar Garbe, MD.  The evaluation is for tremor.  The pt previously saw Dr. Sandria Manly.  No previous notes are available from [facility] to review.  The patient is a 67 y.o. right handed female with a history of tremor.  Tremor began several years ago, but worse over the last 2 years.  There is a family hx of tremor in her father and in her sister.  Her sister is on medication, ? primidone.    Affected by caffeine:  no Affected by alcohol:  no Affected by stress:  yes Affected by fatigue:  yes Spills soup if on spoon:  yes Spills glass of liquid if full:  yes Affects ADL's (tying shoes, brushing teeth, etc):  yes  Current/Previously tried tremor medications: no  Current medications that may exacerbate tremor:  Prednisone (pt feels like it makes tremor worse and is moody with the medication)  Pt states that about a month ago she woke up in the AM and saw a bright color out of the eye with a black center.  It lasted 1 min.  She has had more frequent  Episodes.  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  She states that the left side of the head feels different than the right but there is no pain or discrete sensory change.  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    Outside reports reviewed: historical medical records.  Allergies  Allergen Reactions  . Compazine [Prochlorperazine Edisylate] Other (See Comments)    paralysis of face  . Prochlorperazine Edisylate     REACTION: extrapyrimadal syndrome  . Tetanus-Diphtheria Toxoids Td     REACTION: arm swells    Current Outpatient Prescriptions on File Prior to Visit  Medication Sig Dispense Refill  . adalimumab (HUMIRA PEN STARTER) 40 MG/0.8ML injection Use 4 pens (160 mg) SQ at one time. Then, 2 weeks laster, take 80 mg (2 pens). Then, start maintinance dose.  6 each  0  . adalimumab (HUMIRA) 40 MG/0.8ML injection  Inject 0.8 mLs (40 mg total) into the skin every 14 (fourteen) days.  6 each  0  . aspirin 81 MG tablet Take 81 mg by mouth daily.        . balsalazide (COLAZAL) 750 MG capsule Take 4 tablets twice daily ( 8 tabs)  240 capsule  3  . benazepril (LOTENSIN) 10 MG tablet TAKE 1/2 TABLET BY MOUTH DAILY  90 tablet  0  . Calcium-Vitamin D 600-200 MG-UNIT per tablet Take 2 tablets by mouth daily.      . cholecalciferol (VITAMIN D) 1000 UNITS tablet Take 1,000 Units by mouth daily.        . Fe Fum-FePoly-Vit C-Vit B3 (INTEGRA) 62.5-62.5-40-3 MG CAPS Take 1 capsule by mouth daily.  14 capsule  0  . furosemide (LASIX) 20 MG tablet Take 1 tablet (20 mg total) by mouth daily.  90 tablet  3  . Multiple Vitamin (MULTIVITAMIN) tablet Take 1 tablet by mouth daily.      . Omega-3 Fatty Acids (FISH OIL) 1200 MG CAPS Take by mouth daily.        . pantoprazole (PROTONIX) 40 MG tablet GIVE "SANDRA_KAYE" 1 TABLET BY MOUTH DAILY  90 tablet  0  . potassium chloride SA (K-DUR,KLOR-CON) 20 MEQ tablet TAKE 1 TABLET BY MOUTH DAILY  90 tablet  1  .  predniSONE (DELTASONE) 10 MG tablet Take 1 tablet (10 mg total) by mouth as directed.  100 tablet  0  . vitamin C (ASCORBIC ACID) 500 MG tablet Take 500 mg by mouth daily.         No current facility-administered medications on file prior to visit.    Past Medical History  Diagnosis Date  . Takotsubo cardiomyopathy 2007  . Raynaud's disease   . Mitral valve prolapse   . Ulcerative colitis   . Deep vein thrombosis   . Telangiectasia   . Hyperthyroidism   . GERD (gastroesophageal reflux disease)   . Cardiomyopathy   . Myocardial infarction     Past Surgical History  Procedure Laterality Date  . Shoulder surgery  1996    right  . Tubal ligation  1973  . Appendectomy  1971  . Colonoscopy    . Colonoscopy  05/11/2012    Procedure: COLONOSCOPY;  Surgeon: Hart Carwin, MD;  Location: [facility];  Service: Endoscopy;  Laterality: N/A;    History   Social History   . Marital Status: Married    Spouse Name: N/A    Number of Children: N/A  . Years of Education: N/A   Occupational History  . Retired     Engineer, civil (consulting)   Social History Main Topics  . Smoking status: Never Smoker   . Smokeless tobacco: Never Used  . Alcohol Use: 4.2 oz/week    7 Glasses of wine per week     Comment: glass of wine daily   . Drug Use: No  . Sexual Activity: Not on file   Other Topics Concern  . Not on file   Social History Narrative  . No narrative on file    Family Status  Relation Status Death Age  . Father Deceased   . Mother Deceased   . Sister Alive   . Sister Alive   . Sister Alive   . Sister Alive     Review of Systems A complete 10 system ROS was obtained and was negative apart from what is mentioned.   Objective:   VITALS:   Filed Vitals:   04/08/13 1036  BP: 122/80  Pulse: 60  Temp: 97.5 F (36.4 C)  Resp: 14  Height: 5\' 7"  (1.702 m)  Weight: 123 lb 1.6 oz (55.838 kg)   Gen:  Appears stated age and in NAD. HEENT:  Normocephalic, atraumatic. The mucous membranes are moist. The superficial temporal arteries are without ropiness or tenderness. Cardiovascular: Regular rate and rhythm. Lungs: Clear to auscultation bilaterally. Neck: There are no carotid bruits noted bilaterally.  NEUROLOGICAL:  Orientation:  The patient is alert and oriented x 3.  Recent and remote memory are intact.  Attention span and concentration are normal.  Able to name objects and repeat without trouble.  Fund of knowledge is appropriate Cranial nerves: There is good facial symmetry. The pupils are equal round and reactive to light bilaterally. Fundoscopic exam reveals clear disc margins bilaterally. Extraocular muscles are intact and visual fields are full to confrontational testing. Speech is fluent and clear. Soft palate rises symmetrically and there is no tongue deviation. Hearing is intact to conversational tone. Tone: Tone is good throughout. Sensation:  Sensation is intact to light touch and pinprick throughout (facial, trunk, extremities). Vibration is intact at the bilateral big toe. There is no extinction with double simultaneous stimulation. There is no sensory dermatomal level identified. Coordination:  The patient has no dysdiadichokinesia or dysmetria. Motor: Strength is 5/5  in the bilateral upper and lower extremities.  Shoulder shrug is equal bilaterally.  There is no pronator drift.  There are no fasciculations noted. DTR's: Deep tendon reflexes are 2+/4 at the bilateral biceps, triceps, brachioradialis, patella and achilles.  Plantar responses are downgoing bilaterally. Gait and Station: The patient is able to ambulate without difficulty. The patient is able to heel toe walk without any difficulty. The patient is able to ambulate in a tandem fashion. The patient is able to stand in the Romberg position.   MOVEMENT EXAM: Tremor:  There is tremor in the bilateral upper extremities, most notable with postural changes, and increases with intention.  The right is worse than the left.  There is a mild resting component on the right.  She has difficulty with Archimedes spirals bilaterally, right worse than left.  She cannot pour water from one glass to another without spilling it.  Labs: She has work from her primary care physician on 03/11/2013.  Her TSH was 0.7 and free T4 was normal at 1.3.     Assessment/Plan:   1.  Essential Tremor.  -This is evidenced by the symmetrical nature, family history and longstanding hx of gradually getting worse.  This has been worsened by prednisone.  -The patient would like to try a medication on an as-needed basis.  I do not think that she is a candidate for propranolol, given her low pulse and she reports that she generally has low blood pressure as well.  I think she is a good candidate for primidone, but she really does not want a daily medication right now.  We talked about clonazepam, and she would like  to try that.  I asked her not to drive initially after she has taken it may make her very sleepy.  She does have some restless leg and it may be beneficial for her to try clonazepam, half tablet at bedtime, which should help the restless leg and she also help her get used to the medication.  Risks, benefits, side effects and alternative therapies were discussed.  The opportunity to ask questions was given and they were answered to the best of my ability.  The patient expressed understanding and willingness to follow the outlined treatment protocols. 2.  vision changes.  -She has had several episodes of seeing colorful floaters in her visual field.  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  She is going to have an MRI of the brain. 3.  The patient has a physical coming up next month.  She is to get a copy of those labs. 4.  Return in about 3 months (around 07/08/2013).

## 2013-04-12 ENCOUNTER — Encounter (HOSPITAL_COMMUNITY)
Admission: RE | Admit: 2013-04-12 | Discharge: 2013-04-12 | Disposition: A | Discharge status: Home / Self Care | Payer: Self-pay | Source: Ambulatory Visit | Attending: Cardiovascular Disease | Admitting: Cardiovascular Disease

## 2013-04-13 ENCOUNTER — Ambulatory Visit
Admission: RE | Admit: 2013-04-13 | Discharge: 2013-04-13 | Disposition: A | Discharge status: Home / Self Care | Payer: Medicare Other | Source: Ambulatory Visit | Attending: Obstetrics and Gynecology | Admitting: Obstetrics and Gynecology

## 2013-04-13 ENCOUNTER — Encounter (HOSPITAL_COMMUNITY)
Admission: RE | Admit: 2013-04-13 | Discharge: 2013-04-13 | Disposition: A | Discharge status: Home / Self Care | Payer: Self-pay | Source: Ambulatory Visit | Attending: Cardiovascular Disease | Admitting: Cardiovascular Disease

## 2013-04-13 ENCOUNTER — Ambulatory Visit
Admission: RE | Admit: 2013-04-13 | Discharge: 2013-04-13 | Disposition: A | Discharge status: Home / Self Care | Payer: Medicare Other | Source: Ambulatory Visit | Attending: Neurology | Admitting: Neurology

## 2013-04-13 DIAGNOSIS — R922 Inconclusive mammogram: Secondary | ICD-10-CM

## 2013-04-14 ENCOUNTER — Encounter (HOSPITAL_COMMUNITY)
Admission: RE | Admit: 2013-04-14 | Discharge: 2013-04-14 | Disposition: A | Discharge status: Home / Self Care | Payer: Self-pay | Source: Ambulatory Visit | Attending: Cardiovascular Disease | Admitting: Cardiovascular Disease

## 2013-04-15 ENCOUNTER — Other Ambulatory Visit: Payer: Self-pay | Admitting: Internal Medicine

## 2013-04-15 ENCOUNTER — Encounter (HOSPITAL_COMMUNITY)
Admission: RE | Admit: 2013-04-15 | Discharge: 2013-04-15 | Disposition: A | Discharge status: Home / Self Care | Payer: Self-pay | Source: Ambulatory Visit | Attending: Cardiovascular Disease | Admitting: Cardiovascular Disease

## 2013-04-19 ENCOUNTER — Encounter (HOSPITAL_COMMUNITY)
Admission: RE | Admit: 2013-04-19 | Discharge: 2013-04-19 | Disposition: A | Discharge status: Home / Self Care | Payer: Self-pay | Source: Ambulatory Visit | Attending: Cardiovascular Disease | Admitting: Cardiovascular Disease

## 2013-04-21 ENCOUNTER — Encounter (HOSPITAL_COMMUNITY)
Admission: RE | Admit: 2013-04-21 | Discharge: 2013-04-21 | Disposition: A | Discharge status: Home / Self Care | Payer: Self-pay | Source: Ambulatory Visit | Attending: Cardiovascular Disease | Admitting: Cardiovascular Disease

## 2013-04-21 DIAGNOSIS — I059 Rheumatic mitral valve disease, unspecified: Secondary | ICD-10-CM | POA: Insufficient documentation

## 2013-04-21 DIAGNOSIS — E785 Hyperlipidemia, unspecified: Secondary | ICD-10-CM | POA: Insufficient documentation

## 2013-04-21 DIAGNOSIS — Z5189 Encounter for other specified aftercare: Secondary | ICD-10-CM | POA: Insufficient documentation

## 2013-04-21 DIAGNOSIS — I252 Old myocardial infarction: Secondary | ICD-10-CM | POA: Insufficient documentation

## 2013-04-21 DIAGNOSIS — I5181 Takotsubo syndrome: Secondary | ICD-10-CM | POA: Insufficient documentation

## 2013-04-21 DIAGNOSIS — Z7901 Long term (current) use of anticoagulants: Secondary | ICD-10-CM | POA: Insufficient documentation

## 2013-04-21 DIAGNOSIS — I10 Essential (primary) hypertension: Secondary | ICD-10-CM | POA: Insufficient documentation

## 2013-04-22 ENCOUNTER — Other Ambulatory Visit: Payer: Self-pay | Admitting: Cardiovascular Disease

## 2013-04-22 ENCOUNTER — Encounter (HOSPITAL_COMMUNITY)
Admission: RE | Admit: 2013-04-22 | Discharge: 2013-04-22 | Disposition: A | Discharge status: Home / Self Care | Payer: Self-pay | Source: Ambulatory Visit | Attending: Cardiovascular Disease | Admitting: Cardiovascular Disease

## 2013-04-26 ENCOUNTER — Encounter (HOSPITAL_COMMUNITY)
Admission: RE | Admit: 2013-04-26 | Discharge: 2013-04-26 | Disposition: A | Discharge status: Home / Self Care | Payer: Self-pay | Source: Ambulatory Visit | Attending: Cardiovascular Disease | Admitting: Cardiovascular Disease

## 2013-04-28 ENCOUNTER — Encounter (HOSPITAL_COMMUNITY)
Admission: RE | Admit: 2013-04-28 | Discharge: 2013-04-28 | Disposition: A | Discharge status: Home / Self Care | Payer: Self-pay | Source: Ambulatory Visit | Attending: Cardiovascular Disease | Admitting: Cardiovascular Disease

## 2013-04-29 ENCOUNTER — Telehealth: Payer: Self-pay | Admitting: Neurology

## 2013-04-29 ENCOUNTER — Encounter (HOSPITAL_COMMUNITY)
Admission: RE | Admit: 2013-04-29 | Discharge: 2013-04-29 | Disposition: A | Discharge status: Home / Self Care | Payer: Self-pay | Source: Ambulatory Visit | Attending: Cardiovascular Disease | Admitting: Cardiovascular Disease

## 2013-04-29 NOTE — Telephone Encounter (Signed)
Patient notified of MRI brain results.    Donika K. Allena Katz, DO

## 2013-04-29 NOTE — Telephone Encounter (Addendum)
Patient called requesting to know the results of her MRI brain.  Returned call to patient, but she was not available.  Message left to call back.  Kirsten Rice. Allena Katz, DO

## 2013-04-29 NOTE — Telephone Encounter (Signed)
Pt returned your call, left message to call back / Sherri

## 2013-05-03 ENCOUNTER — Encounter (HOSPITAL_COMMUNITY)
Admission: RE | Admit: 2013-05-03 | Discharge: 2013-05-03 | Disposition: A | Discharge status: Home / Self Care | Payer: Self-pay | Source: Ambulatory Visit | Attending: Cardiovascular Disease | Admitting: Cardiovascular Disease

## 2013-05-05 ENCOUNTER — Encounter (HOSPITAL_COMMUNITY): Payer: Medicare Other

## 2013-05-06 ENCOUNTER — Encounter (HOSPITAL_COMMUNITY): Payer: Medicare Other

## 2013-05-10 ENCOUNTER — Encounter: Payer: Self-pay | Admitting: Internal Medicine

## 2013-05-10 ENCOUNTER — Encounter (HOSPITAL_COMMUNITY)
Admission: RE | Admit: 2013-05-10 | Discharge: 2013-05-10 | Disposition: A | Discharge status: Home / Self Care | Payer: Self-pay | Source: Ambulatory Visit | Attending: Cardiovascular Disease | Admitting: Cardiovascular Disease

## 2013-05-10 ENCOUNTER — Ambulatory Visit (INDEPENDENT_AMBULATORY_CARE_PROVIDER_SITE_OTHER): Payer: Medicare Other | Admitting: Internal Medicine

## 2013-05-10 VITALS — BP 130/70 | HR 80 | Ht 67.5 in | Wt 125.0 lb

## 2013-05-10 DIAGNOSIS — D849 Immunodeficiency, unspecified: Secondary | ICD-10-CM

## 2013-05-10 DIAGNOSIS — K5100 Ulcerative (chronic) pancolitis without complications: Secondary | ICD-10-CM

## 2013-05-10 MED ORDER — PREDNISONE 5 MG PO TABS: 5.0000 mg | ORAL_TABLET | ORAL | Status: DC

## 2013-05-10 MED ORDER — BALSALAZIDE DISODIUM 750 MG PO CAPS: ORAL_CAPSULE | ORAL | Status: DC

## 2013-05-10 MED ORDER — PANTOPRAZOLE SODIUM 40 MG PO TBEC: DELAYED_RELEASE_TABLET | ORAL | Status: DC

## 2013-05-10 NOTE — Progress Notes (Signed)
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 She has negative IBD markers. She was started on Humira 03/[birth year]/1913 overlapping with prednisone initially at 20 mg daily and then decreased to 10 mg on October 1. She was on Remicade in 2005 for a brief period of time. She is also on Colazal 4 tablets twice a day. 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 Her bone density showed osteopenia. She has a history of cardiomyopathy . She has been very emotional, irritable and somewhat depressed and experiences back pain about 48 hours after an injection of Humira.   Past Medical History  Diagnosis Date  . Takotsubo cardiomyopathy 2007  . Raynaud's disease   . Mitral valve prolapse   . Crohn's disease   . Deep vein thrombosis     pt denies: 04/08/13  . Telangiectasia   . Hyperthyroidism     pt denies:  04/08/13  . GERD (gastroesophageal reflux disease)   . Myocardial infarction   . Migraines     occular   Past Surgical History  Procedure Laterality Date  . Shoulder surgery  1996    right  . Tubal ligation  1973  . Appendectomy  1971  . Colonoscopy    . Colonoscopy  05/11/2012    Procedure: COLONOSCOPY;  Surgeon: Hart Carwin, MD;  Location: [facility];  Service: Endoscopy;  Laterality: N/A;    reports that she has never smoked. She has never used smokeless tobacco. She reports that she drinks about 4.2 ounces of alcohol per week. She reports that she does not use illicit drugs. family history includes Aortic stenosis in her father; Diabetes in her sister and sister; Hypertension in her father and mother; Hyperthyroidism in her sister, sister, sister, and sister. There is no history of Colon cancer. Allergies  Allergen Reactions  . Compazine [Prochlorperazine Edisylate] Other (See Comments)    paralysis of face  . Prochlorperazine Edisylate     REACTION:  extrapyrimadal syndrome  . Tetanus-Diphtheria Toxoids Td     REACTION: arm swells        Review of Systems:back pain, depression  The remainder of the 10 point ROS is negative except as outlined in H&P   Physical Exam: General appearance  Well developed, in no distress. Eyes- non icteric. HEENT nontraumatic, normocephalic. Mouth no lesions, tongue papillated, no cheilosis. Neck supple without adenopathy, thyroid not enlarged, no carotid bruits, no JVD. Lungs Clear to auscultation bilaterally. Cor normal S1, normal S2, regular rhythm, no murmur,  quiet precordium. Abdomen: Soft nontender with normoactive bowel sounds. Rectal: Soft Hemoccult negative stool.  Extremities no pedal edema. Skin no lesions. Neurological alert and oriented x 3. Psychological normal mood and affect.  Assessment and Plan:  Problem #29 67 year old white female on a prednisone taper. She is responding to biologicals. She is Hemoccult negative. Her diarrhea has resolved. She will continue on maintenance dose of Humira. She will also continue Colazal 8 tablets daily. I will see her in 2 months.   05/10/2013 Kirsten Rice

## 2013-05-10 NOTE — Patient Instructions (Signed)
Decrease Prednisone as directed by Dr Juanda Chance.  Cc: Dr Wylene Simmer

## 2013-05-12 ENCOUNTER — Encounter (HOSPITAL_COMMUNITY)
Admission: RE | Admit: 2013-05-12 | Discharge: 2013-05-12 | Disposition: A | Discharge status: Home / Self Care | Payer: Self-pay | Source: Ambulatory Visit | Attending: Cardiovascular Disease | Admitting: Cardiovascular Disease

## 2013-05-13 ENCOUNTER — Encounter (HOSPITAL_COMMUNITY)
Admission: RE | Admit: 2013-05-13 | Discharge: 2013-05-13 | Disposition: A | Discharge status: Home / Self Care | Payer: Self-pay | Source: Ambulatory Visit | Attending: Cardiovascular Disease | Admitting: Cardiovascular Disease

## 2013-05-17 ENCOUNTER — Encounter (HOSPITAL_COMMUNITY)
Admission: RE | Admit: 2013-05-17 | Discharge: 2013-05-17 | Disposition: A | Discharge status: Home / Self Care | Payer: Self-pay | Source: Ambulatory Visit | Attending: Cardiovascular Disease | Admitting: Cardiovascular Disease

## 2013-05-19 ENCOUNTER — Encounter (HOSPITAL_COMMUNITY)
Admission: RE | Admit: 2013-05-19 | Discharge: 2013-05-19 | Disposition: A | Discharge status: Home / Self Care | Payer: Self-pay | Source: Ambulatory Visit | Attending: Cardiovascular Disease | Admitting: Cardiovascular Disease

## 2013-05-20 ENCOUNTER — Encounter (HOSPITAL_COMMUNITY)
Admission: RE | Admit: 2013-05-20 | Discharge: 2013-05-20 | Disposition: A | Discharge status: Home / Self Care | Payer: Self-pay | Source: Ambulatory Visit | Attending: Cardiovascular Disease | Admitting: Cardiovascular Disease

## 2013-05-23 DIAGNOSIS — F321 Major depressive disorder, single episode, moderate: Secondary | ICD-10-CM | POA: Insufficient documentation

## 2013-05-24 ENCOUNTER — Encounter (HOSPITAL_COMMUNITY)
Admission: RE | Admit: 2013-05-24 | Discharge: 2013-05-24 | Disposition: A | Discharge status: Home / Self Care | Payer: Self-pay | Source: Ambulatory Visit | Attending: Cardiovascular Disease | Admitting: Cardiovascular Disease

## 2013-05-24 DIAGNOSIS — Z7901 Long term (current) use of anticoagulants: Secondary | ICD-10-CM | POA: Insufficient documentation

## 2013-05-24 DIAGNOSIS — E785 Hyperlipidemia, unspecified: Secondary | ICD-10-CM | POA: Insufficient documentation

## 2013-05-24 DIAGNOSIS — Z5189 Encounter for other specified aftercare: Secondary | ICD-10-CM | POA: Insufficient documentation

## 2013-05-24 DIAGNOSIS — I10 Essential (primary) hypertension: Secondary | ICD-10-CM | POA: Insufficient documentation

## 2013-05-24 DIAGNOSIS — I5181 Takotsubo syndrome: Secondary | ICD-10-CM | POA: Insufficient documentation

## 2013-05-24 DIAGNOSIS — I059 Rheumatic mitral valve disease, unspecified: Secondary | ICD-10-CM | POA: Insufficient documentation

## 2013-05-24 DIAGNOSIS — I252 Old myocardial infarction: Secondary | ICD-10-CM | POA: Insufficient documentation

## 2013-05-26 ENCOUNTER — Other Ambulatory Visit: Payer: Self-pay

## 2013-05-26 ENCOUNTER — Encounter (HOSPITAL_COMMUNITY)
Admission: RE | Admit: 2013-05-26 | Discharge: 2013-05-26 | Disposition: A | Discharge status: Home / Self Care | Payer: Self-pay | Source: Ambulatory Visit | Attending: Cardiovascular Disease | Admitting: Cardiovascular Disease

## 2013-05-27 ENCOUNTER — Encounter (HOSPITAL_COMMUNITY)
Admission: RE | Admit: 2013-05-27 | Discharge: 2013-05-27 | Disposition: A | Discharge status: Home / Self Care | Payer: Self-pay | Source: Ambulatory Visit | Attending: Cardiovascular Disease | Admitting: Cardiovascular Disease

## 2013-05-31 ENCOUNTER — Encounter (HOSPITAL_COMMUNITY)
Admission: RE | Admit: 2013-05-31 | Discharge: 2013-05-31 | Disposition: A | Discharge status: Home / Self Care | Payer: Self-pay | Source: Ambulatory Visit | Attending: Cardiovascular Disease | Admitting: Cardiovascular Disease

## 2013-06-02 ENCOUNTER — Encounter (HOSPITAL_COMMUNITY): Payer: Self-pay

## 2013-06-03 ENCOUNTER — Encounter (HOSPITAL_COMMUNITY): Admission: RE | Admit: 2013-06-03 | Payer: Self-pay | Source: Ambulatory Visit

## 2013-06-07 ENCOUNTER — Encounter: Payer: Self-pay | Admitting: Cardiovascular Disease

## 2013-06-07 ENCOUNTER — Encounter (HOSPITAL_COMMUNITY)
Admission: RE | Admit: 2013-06-07 | Discharge: 2013-06-07 | Disposition: A | Discharge status: Home / Self Care | Payer: Self-pay | Source: Ambulatory Visit | Attending: Cardiovascular Disease | Admitting: Cardiovascular Disease

## 2013-06-07 ENCOUNTER — Ambulatory Visit (INDEPENDENT_AMBULATORY_CARE_PROVIDER_SITE_OTHER): Payer: Medicare Other | Admitting: Cardiovascular Disease

## 2013-06-07 VITALS — BP 120/78 | HR 83 | Ht 67.0 in | Wt 124.0 lb

## 2013-06-07 DIAGNOSIS — I428 Other cardiomyopathies: Secondary | ICD-10-CM

## 2013-06-07 DIAGNOSIS — I5181 Takotsubo syndrome: Secondary | ICD-10-CM

## 2013-06-07 MED ORDER — BENAZEPRIL HCL 10 MG PO TABS: ORAL_TABLET | ORAL | Status: DC

## 2013-06-07 MED ORDER — POTASSIUM CHLORIDE CRYS ER 20 MEQ PO TBCR: EXTENDED_RELEASE_TABLET | ORAL | Status: DC

## 2013-06-07 MED ORDER — FUROSEMIDE 20 MG PO TABS: 20.0000 mg | ORAL_TABLET | Freq: Every day | ORAL | Status: DC

## 2013-06-07 NOTE — Progress Notes (Signed)
HPI:  67 year-old woman with history of Takotsubo's cardiomyopathy presenting for followup evaluation. Her cardiac event occurred in 2007 after a stressful meeting at work. Her LV function has completely normalized. Cardiac catheterization back in 2007 showed no obstructive CAD.  The patient has had a difficult year. She's been on prednisone for treatment of Crohn's disease. She's had a lot of side effects and has just weaned off of prednisone. She has just started Humira.  From a cardiac perspective, she reports 1 episode of prolonged chest pain last month with the pain lasting over 12 hours. She's had no chest pain since that time. She reports mild exertional dyspnea with moderate level activity and this is unchanged over time. She has no resting shortness of breath. She denies leg swelling, orthopnea, or PND. She does admit to palpitations.   Outpatient Encounter Prescriptions as of 06/07/2013  Medication Sig  . adalimumab (HUMIRA) 40 MG/0.8ML injection Inject 0.8 mLs (40 mg total) into the skin every 14 (fourteen) days.  Marland Kitchen aspirin 81 MG tablet Take 81 mg by mouth daily.    . balsalazide (COLAZAL) 750 MG capsule Take 4 tablets twice daily ( 8 tabs)  . benazepril (LOTENSIN) 10 MG tablet TAKE 1/2 TABLET BY MOUTH DAILY  . Biotin 2500 MCG CAPS Take by mouth once.  . Calcium-Vitamin D 600-200 MG-UNIT per tablet Take 2 tablets by mouth daily.  . furosemide (LASIX) 20 MG tablet Take 1 tablet (20 mg total) by mouth daily.  Marland Kitchen HUMIRA PEN 40 MG/0.8ML injection INJECT 1 PEN SUBCUTANEOUSLY EVERY 14 DAYS REFRIGERATE  . Omega-3 Fatty Acids (FISH OIL) 1200 MG CAPS Take by mouth daily.    . pantoprazole (PROTONIX) 40 MG tablet Take 1 TABLET BY MOUTH DAILY  . potassium chloride SA (K-DUR,KLOR-CON) 20 MEQ tablet TAKE 1 TABLET BY MOUTH DAILY  . vitamin C (ASCORBIC ACID) 500 MG tablet Take 500 mg by mouth daily.    . [DISCONTINUED] cholecalciferol (VITAMIN D) 1000 UNITS tablet Take 1,000 Units by mouth  daily.    . [DISCONTINUED] predniSONE (DELTASONE) 10 MG tablet Take 1 tablet (10 mg total) by mouth as directed.  . [DISCONTINUED] predniSONE (DELTASONE) 5 MG tablet Take 1 tablet (5 mg total) by mouth as directed.    Allergies  Allergen Reactions  . Compazine [Prochlorperazine Edisylate] Other (See Comments)    paralysis of face  . Prochlorperazine Edisylate     REACTION: extrapyrimadal syndrome  . Tetanus-Diphtheria Toxoids Td     REACTION: arm swells    Past Medical History  Diagnosis Date  . Takotsubo cardiomyopathy 2007  . Raynaud's disease   . Mitral valve prolapse   . Crohn's disease   . Deep vein thrombosis     pt denies: 04/08/13  . Telangiectasia   . Hyperthyroidism     pt denies:  04/08/13  . GERD (gastroesophageal reflux disease)   . Myocardial infarction   . Migraines     occular    ROS: Negative except as per HPI  BP 120/78  Pulse 83  Ht 5\' 7"  (1.702 m)  Wt 124 lb (56.246 kg)  BMI 19.42 kg/m2  PHYSICAL EXAM: Pt is alert and oriented, NAD HEENT: normal Neck: JVP - normal, carotids 2+= without bruits Lungs: CTA bilaterally CV: RRR without murmur or gallop Abd: soft, NT, Positive BS, no hepatomegaly Ext: no C/C/E, distal pulses intact and equal Skin: warm/dry no rash  EKG:  Normal sinus rhythm 83 beats per minute, within normal limits.  ASSESSMENT  AND PLAN: 1. Takotsubo's Cardiomyopathy with recovery of LV function (event 2007) 2. Mitral valve prolapse 3. HTN  The patient had a prolonged episode of chest pain recently. She has mild exertional dyspnea. I recommended a repeat echocardiogram since it has been several years from her last study. I will see her back in one year for followup evaluation.  Tonny Bollman 06/07/2013 3:37 PM

## 2013-06-07 NOTE — Patient Instructions (Signed)
Your physician recommends that you continue on your current medications as directed. Please refer to the Current Medication list given to you today.  Your physician wants you to follow-up in: one year with Dr Excell Seltzer. You will receive a reminder letter in the mail two months in advance. If you don't receive a letter, please call our office to schedule the follow-up appointment.  Your physician has requested that you have an echocardiogram. Echocardiography is a painless test that uses sound waves to create images of your heart. It provides your doctor with information about the size and shape of your heart and how well your heart's chambers and valves are working. This procedure takes approximately one hour. There are no restrictions for this procedure.

## 2013-06-08 ENCOUNTER — Ambulatory Visit: Payer: Medicare Other | Admitting: Cardiovascular Disease

## 2013-06-09 ENCOUNTER — Encounter (HOSPITAL_COMMUNITY)
Admission: RE | Admit: 2013-06-09 | Discharge: 2013-06-09 | Disposition: A | Discharge status: Home / Self Care | Payer: Self-pay | Source: Ambulatory Visit | Attending: Cardiovascular Disease | Admitting: Cardiovascular Disease

## 2013-06-10 ENCOUNTER — Other Ambulatory Visit: Payer: Self-pay | Admitting: Dermatology

## 2013-06-10 ENCOUNTER — Encounter (HOSPITAL_COMMUNITY)
Admission: RE | Admit: 2013-06-10 | Discharge: 2013-06-10 | Disposition: A | Discharge status: Home / Self Care | Payer: Self-pay | Source: Ambulatory Visit | Attending: Cardiovascular Disease | Admitting: Cardiovascular Disease

## 2013-06-14 ENCOUNTER — Encounter (HOSPITAL_COMMUNITY)
Admission: RE | Admit: 2013-06-14 | Discharge: 2013-06-14 | Disposition: A | Discharge status: Home / Self Care | Payer: Self-pay | Source: Ambulatory Visit | Attending: Cardiovascular Disease | Admitting: Cardiovascular Disease

## 2013-06-15 ENCOUNTER — Encounter (HOSPITAL_COMMUNITY)
Admission: RE | Admit: 2013-06-15 | Discharge: 2013-06-15 | Disposition: A | Discharge status: Home / Self Care | Payer: Self-pay | Source: Ambulatory Visit | Attending: Cardiovascular Disease | Admitting: Cardiovascular Disease

## 2013-06-21 ENCOUNTER — Encounter (HOSPITAL_COMMUNITY)
Admission: RE | Admit: 2013-06-21 | Discharge: 2013-06-21 | Disposition: A | Discharge status: Home / Self Care | Payer: Self-pay | Source: Ambulatory Visit | Attending: Cardiovascular Disease | Admitting: Cardiovascular Disease

## 2013-06-21 DIAGNOSIS — Z7901 Long term (current) use of anticoagulants: Secondary | ICD-10-CM | POA: Insufficient documentation

## 2013-06-21 DIAGNOSIS — Z5189 Encounter for other specified aftercare: Secondary | ICD-10-CM | POA: Insufficient documentation

## 2013-06-21 DIAGNOSIS — I252 Old myocardial infarction: Secondary | ICD-10-CM | POA: Insufficient documentation

## 2013-06-21 DIAGNOSIS — I10 Essential (primary) hypertension: Secondary | ICD-10-CM | POA: Insufficient documentation

## 2013-06-21 DIAGNOSIS — I059 Rheumatic mitral valve disease, unspecified: Secondary | ICD-10-CM | POA: Insufficient documentation

## 2013-06-21 DIAGNOSIS — I5181 Takotsubo syndrome: Secondary | ICD-10-CM | POA: Insufficient documentation

## 2013-06-21 DIAGNOSIS — E785 Hyperlipidemia, unspecified: Secondary | ICD-10-CM | POA: Insufficient documentation

## 2013-06-22 ENCOUNTER — Ambulatory Visit (HOSPITAL_COMMUNITY): Payer: Medicare Other | Attending: Cardiovascular Disease | Admitting: Cardiology

## 2013-06-22 ENCOUNTER — Telehealth: Payer: Self-pay | Admitting: Internal Medicine

## 2013-06-22 DIAGNOSIS — I059 Rheumatic mitral valve disease, unspecified: Secondary | ICD-10-CM | POA: Insufficient documentation

## 2013-06-22 DIAGNOSIS — I252 Old myocardial infarction: Secondary | ICD-10-CM | POA: Insufficient documentation

## 2013-06-22 DIAGNOSIS — I5181 Takotsubo syndrome: Secondary | ICD-10-CM

## 2013-06-22 DIAGNOSIS — I079 Rheumatic tricuspid valve disease, unspecified: Secondary | ICD-10-CM | POA: Insufficient documentation

## 2013-06-22 DIAGNOSIS — L719 Rosacea, unspecified: Secondary | ICD-10-CM

## 2013-06-22 DIAGNOSIS — I428 Other cardiomyopathies: Secondary | ICD-10-CM

## 2013-06-22 MED ORDER — DOXYCYCLINE HYCLATE 50 MG PO CAPS: ORAL_CAPSULE | ORAL | Status: DC

## 2013-06-22 MED ORDER — PROBIOTIC DAILY PO CAPS: ORAL_CAPSULE | ORAL | Status: DC

## 2013-06-22 NOTE — Telephone Encounter (Signed)
Rx sent to pharmacy. Left a message for patient to call me. 

## 2013-06-22 NOTE — Telephone Encounter (Signed)
Spoke with patient and gave her Dr. Brodie's recommendations. 

## 2013-06-22 NOTE — Telephone Encounter (Signed)
Spoke with patient and she states she is having a bout of rosacea. Her dermatologist usually prescribes Doxycycline but he would not order it. He told her to call Dr. Juanda Chance because she had a flare last time she was on it. Please, advise.

## 2013-06-22 NOTE — Telephone Encounter (Signed)
I don't think she should flare up on Doxycycline. OK to take Doxycycline 50 mg 1 po qd x 30 days, no refill. Also take a Probiotic with it.

## 2013-06-22 NOTE — Progress Notes (Signed)
Echo performed. 

## 2013-06-23 ENCOUNTER — Encounter (HOSPITAL_COMMUNITY)
Admission: RE | Admit: 2013-06-23 | Discharge: 2013-06-23 | Disposition: A | Discharge status: Home / Self Care | Payer: Self-pay | Source: Ambulatory Visit | Attending: Cardiovascular Disease | Admitting: Cardiovascular Disease

## 2013-06-23 ENCOUNTER — Other Ambulatory Visit: Payer: Self-pay | Admitting: Internal Medicine

## 2013-06-24 ENCOUNTER — Encounter (HOSPITAL_COMMUNITY): Payer: Self-pay

## 2013-06-26 ENCOUNTER — Other Ambulatory Visit: Payer: Self-pay | Admitting: Cardiovascular Disease

## 2013-06-28 ENCOUNTER — Encounter (HOSPITAL_COMMUNITY)
Admission: RE | Admit: 2013-06-28 | Discharge: 2013-06-28 | Disposition: A | Discharge status: Home / Self Care | Payer: Self-pay | Source: Ambulatory Visit | Attending: Cardiovascular Disease | Admitting: Cardiovascular Disease

## 2013-06-28 ENCOUNTER — Encounter: Payer: Self-pay | Admitting: Internal Medicine

## 2013-06-28 ENCOUNTER — Ambulatory Visit (INDEPENDENT_AMBULATORY_CARE_PROVIDER_SITE_OTHER): Payer: Medicare Other | Admitting: Internal Medicine

## 2013-06-28 VITALS — BP 116/70 | HR 80 | Ht 67.0 in | Wt 123.4 lb

## 2013-06-28 DIAGNOSIS — K5100 Ulcerative (chronic) pancolitis without complications: Secondary | ICD-10-CM

## 2013-06-28 DIAGNOSIS — D849 Immunodeficiency, unspecified: Secondary | ICD-10-CM

## 2013-06-28 DIAGNOSIS — Z79899 Other long term (current) drug therapy: Secondary | ICD-10-CM

## 2013-06-28 MED ORDER — TRAMADOL HCL 50 MG PO TABS: ORAL_TABLET | ORAL | Status: DC

## 2013-06-28 MED ORDER — BALSALAZIDE DISODIUM 750 MG PO CAPS: ORAL_CAPSULE | ORAL | Status: DC

## 2013-06-28 MED ORDER — PROMETHAZINE HCL 12.5 MG PO TABS: ORAL_TABLET | ORAL | Status: DC

## 2013-06-28 NOTE — Progress Notes (Signed)
Kirsten Rice [birth year] [id number]   History of Present Illness:   This is a 67 year old white female with ulcerative colitis versus Crohn's colitis of more than 20 years duration. Her last office visit was 05/10/2013. Her last colonoscopy in October 2013 showed pan colitis and pseudopolyps. Her IBD markers are negative. She started Humira in September 2014 and tapered her prednisone. She was on Remicade in 2005. She is currently on Colazal 750 mg 4 tablets twice a day. She is up-to-date on her bone density, TB skin testing, hepatitis A and B serologies. She recently had a blood count which was 13.0. She is satisfied with response to Humira but has developed migraine headaches. She used to have migraine headaches in the past. She thinks the Humira has also reactivated her rosacea for which she takes Vibramycin 50 mg daily  Past Medical History  Diagnosis Date  . Takotsubo cardiomyopathy 2007  . Raynaud's disease   . Mitral valve prolapse   . Crohn's disease   . Deep vein thrombosis     pt denies: 04/08/13  . Telangiectasia   . Hyperthyroidism     pt denies:  04/08/13  . GERD (gastroesophageal reflux disease)   . Myocardial infarction   . Migraines     occular    Past Surgical History  Procedure Laterality Date  . Shoulder surgery  1996    right  . Tubal ligation  1973  . Appendectomy  1971  . Colonoscopy    . Colonoscopy  05/11/2012    Procedure: COLONOSCOPY;  Surgeon: Hart Carwin, MD;  Location: [facility];  Service: Endoscopy;  Laterality: N/A;    Allergies  Allergen Reactions  . Compazine [Prochlorperazine Edisylate] Other (See Comments)    paralysis of face  . Prochlorperazine Edisylate     REACTION: extrapyrimadal syndrome  . Tetanus-Diphtheria Toxoids Td     REACTION: arm swells    Review of Systems: Headaches. Skin rash  The remainder of the 10 point ROS is negative except as outlined in the H&P  Physical Exam: General Appearance Well developed, in no  distress Eyes  Non icteric  HEENT  Non traumatic, normocephalic  Mouth No lesion, tongue papillated, no cheilosis Neck Supple without adenopathy, thyroid not enlarged, no carotid bruits, no JVD Lungs Clear to auscultation bilaterally COR Normal S1, normal S2, regular rhythm, no murmur, quiet precordium Abdomen Soft, non tender with normoactive bowel sounds Rectal not done Extremities  No pedal edema Skin No lesions Neurological Alert and oriented x 3 Psychological Normal mood and affect  Assessment and Plan:  Problem #1 Inflammatory bowel disease. This is currently in remission on Humira. She will take tramadol and Phenergan for headaches which she associates with taking Humira. She will reduce her Colazal to 2 tablets twice a day. She also needs prior authorization for Humira for 90 days. We will obtain blood test results Dr.Tisovec. I will see her in 3 months.   Problem #2 migraine headaches. I have asked her to followup with Dr.Tisovec to manage her headaches Problem #3 anemia. She is on iron supplements. Last hemoglobin was normal Problem #4 cardiomyopathy. Followed by cardiology   Kirsten Rice 06/28/2013

## 2013-06-28 NOTE — Patient Instructions (Addendum)
We have sent the following medications to your pharmacy for you to pick up at your convenience: Colozal 2 tablets twice daily Tramadol 50 mg-Take 1-2 tablets every 6 hours as needed for headache Phenergan 12.5 mg-Take 1 tablet every 4-6 hours as needed  We have requested your recent labwork from Dr Wylene Simmer.  We will obtain prior authorization for your Humira.  CC: Dr Wylene Simmer

## 2013-06-29 ENCOUNTER — Telehealth: Payer: Self-pay | Admitting: Internal Medicine

## 2013-06-29 ENCOUNTER — Telehealth: Payer: Self-pay | Admitting: *Deleted

## 2013-06-29 NOTE — Telephone Encounter (Signed)
I have advised Kirsten Rice of the conversation below. She verbalizes understanding to call back the first week of January to request that we try for Humira prior authorization again. She will also request that we sent a 3 month supply of the Humira as it will save her several hundred dollars.

## 2013-06-29 NOTE — Telephone Encounter (Signed)
Left message for patient to call back  

## 2013-06-29 NOTE — Telephone Encounter (Signed)
I have spoken to patient. The tramadol was not received. I called and gave a verbal order to [facility] for the tramadol. That she be ready soon.

## 2013-06-29 NOTE — Telephone Encounter (Signed)
I have called and spoken to a representative with [employer] regarding getting patient approved for Humira for 2015 since patient recently received a letter that she would need authorization for this upcoming year. Per the representative, the prior authorization is not able to be completed until the first week of January as the current system will not allow updates for next year yet. He states that there is a grace period in which the patient should be able to get her medication at the 2014 price while we work on prior authorization. Also, per patient's approval letter from [facility] in 01/2013, she was approved for the medication until 01/2014. I will ask about this in January as well.

## 2013-06-30 ENCOUNTER — Encounter (HOSPITAL_COMMUNITY)
Admission: RE | Admit: 2013-06-30 | Discharge: 2013-06-30 | Disposition: A | Discharge status: Home / Self Care | Payer: Self-pay | Source: Ambulatory Visit | Attending: Cardiovascular Disease | Admitting: Cardiovascular Disease

## 2013-07-01 ENCOUNTER — Encounter (HOSPITAL_COMMUNITY)
Admission: RE | Admit: 2013-07-01 | Discharge: 2013-07-01 | Disposition: A | Discharge status: Home / Self Care | Payer: Self-pay | Source: Ambulatory Visit | Attending: Cardiovascular Disease | Admitting: Cardiovascular Disease

## 2013-07-05 ENCOUNTER — Encounter (HOSPITAL_COMMUNITY)
Admission: RE | Admit: 2013-07-05 | Discharge: 2013-07-05 | Disposition: A | Discharge status: Home / Self Care | Payer: Self-pay | Source: Ambulatory Visit | Attending: Cardiovascular Disease | Admitting: Cardiovascular Disease

## 2013-07-07 ENCOUNTER — Other Ambulatory Visit: Payer: Self-pay | Admitting: Physician Assistant

## 2013-07-07 ENCOUNTER — Encounter (HOSPITAL_COMMUNITY)
Admission: RE | Admit: 2013-07-07 | Discharge: 2013-07-07 | Disposition: A | Discharge status: Home / Self Care | Payer: Self-pay | Source: Ambulatory Visit | Attending: Cardiovascular Disease | Admitting: Cardiovascular Disease

## 2013-07-08 ENCOUNTER — Ambulatory Visit (INDEPENDENT_AMBULATORY_CARE_PROVIDER_SITE_OTHER): Payer: Medicare Other | Admitting: Neurology

## 2013-07-08 ENCOUNTER — Encounter: Payer: Self-pay | Admitting: Neurology

## 2013-07-08 ENCOUNTER — Encounter (HOSPITAL_COMMUNITY)
Admission: RE | Admit: 2013-07-08 | Discharge: 2013-07-08 | Disposition: A | Discharge status: Home / Self Care | Payer: Self-pay | Source: Ambulatory Visit | Attending: Cardiovascular Disease | Admitting: Cardiovascular Disease

## 2013-07-08 ENCOUNTER — Other Ambulatory Visit: Payer: Self-pay | Admitting: Neurology

## 2013-07-08 VITALS — BP 110/64 | HR 60 | Temp 98.0°F | Resp 12 | Ht 67.0 in | Wt 122.3 lb

## 2013-07-08 DIAGNOSIS — M542 Cervicalgia: Secondary | ICD-10-CM

## 2013-07-08 DIAGNOSIS — G43909 Migraine, unspecified, not intractable, without status migrainosus: Secondary | ICD-10-CM

## 2013-07-08 DIAGNOSIS — G250 Essential tremor: Secondary | ICD-10-CM

## 2013-07-08 MED ORDER — MELOXICAM 7.5 MG PO TABS: 7.5000 mg | ORAL_TABLET | ORAL | Status: DC | PRN

## 2013-07-08 MED ORDER — TIZANIDINE HCL 4 MG PO TABS: 4.0000 mg | ORAL_TABLET | ORAL | Status: DC

## 2013-07-08 NOTE — Patient Instructions (Signed)
1.  Start zanaflex - 1/2 tablet nightly for 1 week, then 1 tablet at night for 1 week.  Take that for 2-3 weeks and call me.  I may need to increase the dosage. 2.  I gave you RX for Mobic if you need it when you get a headache.

## 2013-07-08 NOTE — Progress Notes (Signed)
Subjective:   Kirsten Rice was seen in consultation in the movement disorder clinic at the request of Gaspar Garbe, MD.  The evaluation is for tremor.  The pt previously saw Dr. Sandria Manly.  No previous notes are available from [facility] to review.  The patient is a 67 y.o. right handed female with a history of tremor.  Tremor began several years ago, but worse over the last 2 years.  There is a family hx of tremor in her father and in her sister.  Her sister is on medication, ? primidone.    Affected by caffeine:  no Affected by alcohol:  no Affected by stress:  yes Affected by fatigue:  yes Spills soup if on spoon:  yes Spills glass of liquid if full:  yes Affects ADL's (tying shoes, brushing teeth, etc):  yes  Current/Previously tried tremor medications: no  Current medications that may exacerbate tremor:  Prednisone (pt feels like it makes tremor worse and is moody with the medication)  Pt states that about a month ago she woke up in the AM and saw a bright color out of the eye with a black center.  It lasted 1 min.  She has had more frequent  Episodes.  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  She states that the left side of the head feels different than the right but there is no pain or discrete sensory change.  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    07/08/13:  The pt is f/u today.  She has ET.  She was given klonopin as she only wanted a prn medication.  She never filled it as she read the potential side effects and said that she would rather shake than take it.   She is now off of the prednisone and has been off of it since mid November and was able to take Humira.  Going off the prednisone definitely decreased for tremor, but unfortunately going on Humira caused a resurgence of her migraines that she had years ago.  She states that after she takes the Humira, she will get a frontal headache that feels like her head will explode."  She has nausea and  vomiting.  This is like the migraines she used to get.  Independently, she also has some muscle spasm in the right side of the neck.  She has no shooting pain down the arm.  She has no electric-like pain at all.  An MRI of the brain was done since last visit.  There was moderate small vessel disease and atrophy.  The patient and I discussed this today.  Outside reports reviewed: historical medical records.  Allergies  Allergen Reactions  . Compazine [Prochlorperazine Edisylate] Other (See Comments)    paralysis of face  . Prochlorperazine Edisylate     REACTION: extrapyrimadal syndrome  . Tetanus-Diphtheria Toxoids Td     REACTION: arm swells   Meds:  reviewed  Past Medical History  Diagnosis Date  . Takotsubo cardiomyopathy 2007  . Raynaud's disease   . Mitral valve prolapse   . Crohn's disease   . Deep vein thrombosis     pt denies: 04/08/13  . Telangiectasia   . Hyperthyroidism     pt denies:  04/08/13  . GERD (gastroesophageal reflux disease)   . Myocardial infarction   . Migraines     occular    Past Surgical History  Procedure Laterality Date  . Shoulder surgery  1996  right  . Tubal ligation  1973  . Appendectomy  1971  . Colonoscopy    . Colonoscopy  05/11/2012    Procedure: COLONOSCOPY;  Surgeon: Hart Carwin, MD;  Location: [facility];  Service: Endoscopy;  Laterality: N/A;    History   Social History  . Marital Status: Married    Spouse Name: N/A    Number of Children: N/A  . Years of Education: N/A   Occupational History  . Retired     Engineer, civil (consulting)   Social History Main Topics  . Smoking status: Never Smoker   . Smokeless tobacco: Never Used  . Alcohol Use: 4.2 oz/week    7 Glasses of wine per week     Comment: glass of wine daily   . Drug Use: No  . Sexual Activity: Not on file   Other Topics Concern  . Not on file   Social History Narrative  . No narrative on file    Family Status  Relation Status Death Age  . Father Deceased      Aortic stenosis  . Sister Alive     4, hyperthyroidism, cataracts, DM, tremor  . Mother Deceased     COPD, tob abuse  . Child Alive     bipolar disease    Review of Systems A complete 10 system ROS was obtained and was negative apart from what is mentioned.   Objective:   VITALS:   Filed Vitals:   07/08/13 1106  BP: 110/64  Pulse: 60  Temp: 98 F (36.7 C)  Resp: 12  Height: 5\' 7"  (1.702 m)  Weight: 122 lb 4.8 oz (55.475 kg)   Gen:  Appears stated age and in NAD. HEENT:  Normocephalic, atraumatic. The mucous membranes are moist. The superficial temporal arteries are without ropiness or tenderness. Cardiovascular: Regular rate and rhythm. Lungs: Clear to auscultation bilaterally. Neck: There are no carotid bruits noted bilaterally.  NEUROLOGICAL:  Orientation:  The patient is alert and oriented x 3.  Recent and remote memory are intact.  Attention span and concentration are normal.  Able to name objects and repeat without trouble.  Fund of knowledge is appropriate Cranial nerves: There is good facial symmetry. The pupils are equal round and reactive to light bilaterally. Fundoscopic exam reveals clear disc margins bilaterally. Extraocular muscles are intact and visual fields are full to confrontational testing. Speech is fluent and clear. Soft palate rises symmetrically and there is no tongue deviation. Hearing is intact to conversational tone. Tone: Tone is good throughout. Sensation: Sensation is intact to light touch throughout. Coordination:  The patient has no dysdiadichokinesia or dysmetria. Motor: Strength is 5/5 in the bilateral upper and lower extremities.  Shoulder shrug is equal bilaterally.  There is no pronator drift.  There are no fasciculations noted. Gait and Station: The patient is able to ambulate without difficulty. The patient is able to heel toe walk without any difficulty. The patient is able to ambulate in a tandem fashion. The patient is able to stand in  the Romberg position.   MOVEMENT EXAM: Tremor:  There is tremor in the bilateral upper extremities, most notable with postural changes, and increases with intention.  The right is worse than the left.  There is no resting tremor today.    Labs: She has work from her primary care physician on 03/11/2013.  Her TSH was 0.7 and free T4 was normal at 1.3.     Assessment/Plan:   1.  Essential Tremor.  -This is  evidenced by the symmetrical nature, family history and longstanding hx of gradually getting worse.  This has been worsened by prednisone.  -The patient no longer wants a medicine for essential tremor.  If she changes her mind in the future, I do not think she is a candidate for beta blocker therapy given Raynaud's and given her history of low blood pressure.  primidone may be an option.  She did not want to try clonazepam. 2.  neck pain that sounds like muscle spasm.  -We decided to try Zanaflex at night.  We talked about Lidoderm as well.  We will see how the Zanaflex works.  If she develops any type of shooting pain, we should do an MRI of the cervical spine. 3.  Migraine with humira  -Hopefully, the Zanaflex will help some with migraine prophylaxis.  I gave her some Mobic to try as well.  She should clear this with Dr. Juanda Chance.   4.  The patient is to call me in a few weeks and let me know how she is doing.  Followup is anticipated in the next few months.

## 2013-07-11 ENCOUNTER — Encounter (HOSPITAL_COMMUNITY)
Admission: RE | Admit: 2013-07-11 | Discharge: 2013-07-11 | Disposition: A | Discharge status: Home / Self Care | Payer: Self-pay | Source: Ambulatory Visit | Attending: Cardiovascular Disease | Admitting: Cardiovascular Disease

## 2013-07-12 ENCOUNTER — Encounter (HOSPITAL_COMMUNITY): Payer: Self-pay

## 2013-07-19 ENCOUNTER — Encounter (HOSPITAL_COMMUNITY)
Admission: RE | Admit: 2013-07-19 | Discharge: 2013-07-19 | Disposition: A | Discharge status: Home / Self Care | Payer: Self-pay | Source: Ambulatory Visit | Attending: Cardiovascular Disease | Admitting: Cardiovascular Disease

## 2013-07-22 ENCOUNTER — Encounter (HOSPITAL_COMMUNITY)
Admission: RE | Admit: 2013-07-22 | Discharge: 2013-07-22 | Disposition: A | Discharge status: Home / Self Care | Payer: Self-pay | Source: Ambulatory Visit | Attending: Cardiovascular Disease | Admitting: Cardiovascular Disease

## 2013-07-22 DIAGNOSIS — Z5189 Encounter for other specified aftercare: Secondary | ICD-10-CM | POA: Insufficient documentation

## 2013-07-22 DIAGNOSIS — I10 Essential (primary) hypertension: Secondary | ICD-10-CM | POA: Insufficient documentation

## 2013-07-22 DIAGNOSIS — Z7901 Long term (current) use of anticoagulants: Secondary | ICD-10-CM | POA: Insufficient documentation

## 2013-07-22 DIAGNOSIS — I5181 Takotsubo syndrome: Secondary | ICD-10-CM | POA: Insufficient documentation

## 2013-07-22 DIAGNOSIS — I059 Rheumatic mitral valve disease, unspecified: Secondary | ICD-10-CM | POA: Insufficient documentation

## 2013-07-22 DIAGNOSIS — E785 Hyperlipidemia, unspecified: Secondary | ICD-10-CM | POA: Insufficient documentation

## 2013-07-22 DIAGNOSIS — I252 Old myocardial infarction: Secondary | ICD-10-CM | POA: Insufficient documentation

## 2013-07-26 ENCOUNTER — Encounter (HOSPITAL_COMMUNITY)
Admission: RE | Admit: 2013-07-26 | Discharge: 2013-07-26 | Disposition: A | Discharge status: Home / Self Care | Payer: Self-pay | Source: Ambulatory Visit | Attending: Cardiovascular Disease | Admitting: Cardiovascular Disease

## 2013-07-26 ENCOUNTER — Telehealth: Payer: Self-pay | Admitting: Internal Medicine

## 2013-07-26 DIAGNOSIS — R197 Diarrhea, unspecified: Secondary | ICD-10-CM

## 2013-07-26 NOTE — Telephone Encounter (Signed)
Spoke with patient and last week, she started having liquid brown diarrhea. Reports 6-8 stools/day. She felt better Sunday and Monday but has had 6 diarrhea stools today.  States her stomach is "sore." Denies bleeding. She is taking Humira last dose on 07/14/13 and is due again Thursday, Colazal 4 tabs/daily. She completed her Prednisone taper about a month ago. She completed Doxycyline 50 mg/daily with Align daily aroung 07/14/13. Please, advise.

## 2013-07-26 NOTE — Telephone Encounter (Signed)
Please increase Colazal to 8/day, obtain stool culture for C.Diff by PCR, lactoferrin and pathogens, add Flagyl 250 mg, #21, 1 po tid x 1 week., call with an update  In 1 week

## 2013-07-27 ENCOUNTER — Other Ambulatory Visit: Payer: Medicare Other

## 2013-07-27 DIAGNOSIS — R197 Diarrhea, unspecified: Secondary | ICD-10-CM

## 2013-07-27 MED ORDER — METRONIDAZOLE 250 MG PO TABS: ORAL_TABLET | ORAL | Status: DC

## 2013-07-27 MED ORDER — BALSALAZIDE DISODIUM 750 MG PO CAPS: ORAL_CAPSULE | ORAL | Status: DC

## 2013-07-27 NOTE — Telephone Encounter (Signed)
Patient given recommendations. Rx sent. Lab in [facility]. Patient will call in one week with update.

## 2013-07-28 ENCOUNTER — Encounter (HOSPITAL_COMMUNITY)
Admission: RE | Admit: 2013-07-28 | Discharge: 2013-07-28 | Disposition: A | Discharge status: Home / Self Care | Payer: Self-pay | Source: Ambulatory Visit | Attending: Cardiovascular Disease | Admitting: Cardiovascular Disease

## 2013-07-28 LAB — FECAL LACTOFERRIN, QUANT: LACTOFERRIN: POSITIVE

## 2013-07-29 ENCOUNTER — Encounter (HOSPITAL_COMMUNITY)
Admission: RE | Admit: 2013-07-29 | Discharge: 2013-07-29 | Disposition: A | Discharge status: Home / Self Care | Payer: Self-pay | Source: Ambulatory Visit | Attending: Cardiovascular Disease | Admitting: Cardiovascular Disease

## 2013-07-31 LAB — STOOL CULTURE

## 2013-08-01 ENCOUNTER — Other Ambulatory Visit: Payer: Medicare Other

## 2013-08-01 ENCOUNTER — Other Ambulatory Visit: Payer: Self-pay | Admitting: *Deleted

## 2013-08-01 ENCOUNTER — Telehealth: Payer: Self-pay | Admitting: *Deleted

## 2013-08-01 DIAGNOSIS — R197 Diarrhea, unspecified: Secondary | ICD-10-CM

## 2013-08-01 NOTE — Telephone Encounter (Signed)
Spoke with [place] in our lab and the GI pathogen panel was not sent to the correct lab. Patient will need to recollect. Spoke with patient and she states the diarrhea had decreased to 4-5 loose stools/day. They remain urgent. She out of town but will come to our lab when she gets back to recollect. She will complete her Flagyl on Wednesday.

## 2013-08-01 NOTE — Telephone Encounter (Signed)
Left a message for patient to call me. 

## 2013-08-02 ENCOUNTER — Encounter (HOSPITAL_COMMUNITY): Payer: Medicare Other

## 2013-08-04 ENCOUNTER — Other Ambulatory Visit: Payer: Medicare Other

## 2013-08-04 ENCOUNTER — Encounter (HOSPITAL_COMMUNITY)
Admission: RE | Admit: 2013-08-04 | Discharge: 2013-08-04 | Disposition: A | Discharge status: Home / Self Care | Payer: Self-pay | Source: Ambulatory Visit | Attending: Cardiovascular Disease | Admitting: Cardiovascular Disease

## 2013-08-04 DIAGNOSIS — R197 Diarrhea, unspecified: Secondary | ICD-10-CM

## 2013-08-05 ENCOUNTER — Encounter (HOSPITAL_COMMUNITY)
Admission: RE | Admit: 2013-08-05 | Discharge: 2013-08-05 | Disposition: A | Discharge status: Home / Self Care | Payer: Self-pay | Source: Ambulatory Visit | Attending: Cardiovascular Disease | Admitting: Cardiovascular Disease

## 2013-08-05 LAB — GASTROINTESTINAL PATHOGEN PANEL PCR
C. difficile Tox A/B, PCR: NEGATIVE
Campylobacter, PCR: NEGATIVE
Cryptosporidium, PCR: NEGATIVE
E COLI (STEC) STX1/STX2, PCR: NEGATIVE
E COLI 0157, PCR: NEGATIVE
E coli (ETEC) LT/ST PCR: NEGATIVE
Giardia lamblia, PCR: NEGATIVE
Norovirus, PCR: NEGATIVE
Rotavirus A, PCR: NEGATIVE
SALMONELLA, PCR: NEGATIVE
Shigella, PCR: NEGATIVE

## 2013-08-08 ENCOUNTER — Other Ambulatory Visit: Payer: Self-pay | Admitting: *Deleted

## 2013-08-08 MED ORDER — DICYCLOMINE HCL 10 MG PO CAPS: ORAL_CAPSULE | ORAL | Status: DC

## 2013-08-09 ENCOUNTER — Encounter (HOSPITAL_COMMUNITY)
Admission: RE | Admit: 2013-08-09 | Discharge: 2013-08-09 | Disposition: A | Discharge status: Home / Self Care | Payer: Self-pay | Source: Ambulatory Visit | Attending: Cardiovascular Disease | Admitting: Cardiovascular Disease

## 2013-08-09 ENCOUNTER — Other Ambulatory Visit: Payer: Self-pay | Admitting: *Deleted

## 2013-08-09 DIAGNOSIS — K5090 Crohn's disease, unspecified, without complications: Secondary | ICD-10-CM

## 2013-08-10 ENCOUNTER — Other Ambulatory Visit (INDEPENDENT_AMBULATORY_CARE_PROVIDER_SITE_OTHER): Payer: Medicare Other

## 2013-08-10 DIAGNOSIS — K5090 Crohn's disease, unspecified, without complications: Secondary | ICD-10-CM

## 2013-08-10 LAB — BUN: BUN: 14 mg/dL (ref 6–23)

## 2013-08-10 LAB — CREATININE, SERUM: CREATININE: 1 mg/dL (ref 0.4–1.2)

## 2013-08-11 ENCOUNTER — Encounter (HOSPITAL_COMMUNITY)
Admission: RE | Admit: 2013-08-11 | Discharge: 2013-08-11 | Disposition: A | Discharge status: Home / Self Care | Payer: Self-pay | Source: Ambulatory Visit | Attending: Cardiovascular Disease | Admitting: Cardiovascular Disease

## 2013-08-12 ENCOUNTER — Encounter (HOSPITAL_COMMUNITY)
Admission: RE | Admit: 2013-08-12 | Discharge: 2013-08-12 | Disposition: A | Discharge status: Home / Self Care | Payer: Self-pay | Source: Ambulatory Visit | Attending: Cardiovascular Disease | Admitting: Cardiovascular Disease

## 2013-08-15 ENCOUNTER — Ambulatory Visit (INDEPENDENT_AMBULATORY_CARE_PROVIDER_SITE_OTHER)
Admission: RE | Admit: 2013-08-15 | Discharge: 2013-08-15 | Disposition: A | Discharge status: Home / Self Care | Payer: Medicare Other | Source: Ambulatory Visit | Attending: Internal Medicine | Admitting: Internal Medicine

## 2013-08-15 DIAGNOSIS — K5090 Crohn's disease, unspecified, without complications: Secondary | ICD-10-CM

## 2013-08-15 MED ORDER — IOHEXOL 300 MG/ML  SOLN
100.0000 mL | Freq: Once | INTRAMUSCULAR | Status: AC | PRN
  Administered 2013-08-15: 100 mL via INTRAVENOUS

## 2013-08-16 ENCOUNTER — Ambulatory Visit (INDEPENDENT_AMBULATORY_CARE_PROVIDER_SITE_OTHER): Payer: Medicare Other | Admitting: Internal Medicine

## 2013-08-16 ENCOUNTER — Encounter (HOSPITAL_COMMUNITY)
Admission: RE | Admit: 2013-08-16 | Discharge: 2013-08-16 | Disposition: A | Discharge status: Home / Self Care | Payer: Self-pay | Source: Ambulatory Visit | Attending: Cardiovascular Disease | Admitting: Cardiovascular Disease

## 2013-08-16 ENCOUNTER — Encounter: Payer: Self-pay | Admitting: Internal Medicine

## 2013-08-16 VITALS — BP 128/70 | HR 77 | Ht 67.0 in | Wt 121.0 lb

## 2013-08-16 DIAGNOSIS — R197 Diarrhea, unspecified: Secondary | ICD-10-CM

## 2013-08-16 DIAGNOSIS — K5100 Ulcerative (chronic) pancolitis without complications: Secondary | ICD-10-CM

## 2013-08-16 DIAGNOSIS — D849 Immunodeficiency, unspecified: Secondary | ICD-10-CM

## 2013-08-16 MED ORDER — ALPRAZOLAM 0.5 MG PO TABS: ORAL_TABLET | ORAL | Status: DC

## 2013-08-16 MED ORDER — PREDNISONE 20 MG PO TABS: 20.0000 mg | ORAL_TABLET | ORAL | Status: DC

## 2013-08-16 MED ORDER — DIPHENOXYLATE-ATROPINE 2.5-0.025 MG PO TABS: 1.0000 | ORAL_TABLET | Freq: Two times a day (BID) | ORAL | Status: DC

## 2013-08-16 NOTE — Progress Notes (Signed)
Kirsten Rice [birth year] [id number]  Note: This dictation was prepared with Dragon digital system. Any transcriptional errors that result from this procedure are unintentional.   History of Present Illness:  This is a 68 year old white female, retired Marine scientist, with negative IBD markers and  inflammatory bowel disease, She was initially diagnosed as having ulcerative colitis but the colonoscopy revealed many pseudopolyps raising question of Crohn's disease.. She has been now in a flareup for 1.5 years despite of steroids. . She does not tolerate steroids well because of anxiety and depression. She has finally started biologicals. She used to be on Remicade for several years but this time we started her on Humira 40 mg every 2 weeks. She has been on it since September 2014 and has not noticed any improvement in her symptoms. In fact, since January of this year, she has been having watery stools with no blood, a 2 pound weight loss and mild crampy abdominal pain. Stool studies including C. difficile and pathogens are negative. She is on Colazal 750 mg 4 tablets twice a day. She has been under a great deal of stress with the illness of her ex-husband as of her current husband. She had a CT scan of the abdomen last week to look for other causes of abdominal pain ,but the study  did not show any active process except for airspace  disease in her lungs and active colitis in the right colon extending to the cecum. 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    Past Medical History  Diagnosis Date  . Takotsubo cardiomyopathy 2007  . Raynaud's disease   . Mitral valve prolapse   . Crohn's disease   . Deep vein thrombosis     pt denies: 04/08/13  . Telangiectasia   . Hyperthyroidism     pt denies:  04/08/13  . GERD (gastroesophageal reflux disease)   . Myocardial infarction   . Migraines     occular    Past Surgical History  Procedure Laterality Date  . Shoulder surgery  1996    right  .  Tubal ligation  1973  . Appendectomy  1971  . Colonoscopy    . Colonoscopy  05/11/2012    Procedure: COLONOSCOPY;  Surgeon: Lafayette Dragon, MD;  Location: [facility];  Service: Endoscopy;  Laterality: N/A;    Allergies  Allergen Reactions  . Compazine [Prochlorperazine Edisylate] Other (See Comments)    paralysis of face  . Prochlorperazine Edisylate     REACTION: extrapyrimadal syndrome  . Tetanus-Diphtheria Toxoids Td     REACTION: arm swells    Family history and social history have been reviewed.  Review of Systems: Denies fever nausea or vomiting  The remainder of the 10 point ROS is negative except as outlined in the H&P  Physical Exam: General Appearance Well developed, in no distress Eyes  Non icteric  HEENT  Non traumatic, normocephalic  Mouth No lesion, tongue papillated, no cheilosis Neck Supple without adenopathy, thyroid not enlarged, no carotid bruits, no JVD Lungs Clear to auscultation bilaterally COR Normal S1, normal S2, regular rhythm, no murmur, quiet precordium Abdomen mild tenderness right middle and lower quadrant. No rebound. No tympany Rectal soft Hemoccult negative stool Extremities  No pedal edema Skin No lesions Neurological Alert and oriented x 3 Psychological Normal mood and affect  Assessment and Plan:   Problem #1 ongoing diarrhea attributed to inflammatory bowel disease. Her stool is Hemoccult negative. The CT scan confirms active right-sided colitis. She reports no  response to Humira. Her symptoms may be related to active colitis, however, she has a lot of stress and irritable bowel syndrome is likely to be a contributing factor. She is suggesting to seek  another opinion from a tertiary [facility]. I suggested [facility]. She is now willing to take steroids again and we will start her on prednisone 40 mg daily for a week and will also give her Xanax 0.5 mg at night than 0.25 mg in the morning. She will continue all other  medications. After a week, she will give me an update and decide whether she wants to be referred for a second opinion. I would also consider  colonoscopy to update her disease or may even consider switching her to another biological such as Cimzia or back to Remicade but this would have to be approved by insurance.Refill Lomotil 1 po bid    Delfin Edis 08/16/2013

## 2013-08-16 NOTE — Patient Instructions (Signed)
Dr Olevia Perches has advised that you be on a prednisone taper. The taper instructions are as follows: 20 mg twice daily x 1 week 30 mg ONCE daily x 1 week   Please call our office back in 1 week with an update on your condition. Speak to [place] at [phone].  We have sent the following medications to your pharmacy for you to pick up at your convenience: Xanax-Take 1/2 tablet every morning and 1 tablet every night while on prednisone Lomotil-twice daily  Continue Colozal prescription.  Low-Fiber Diet Fiber is found in fruits, vegetables, and grains. A low-fiber diet restricts fibrous foods that are not digested in the small intestine. A diet containing about 10 grams of fiber is considered low fiber.  PURPOSE  To prevent blockage of a partially obstructed or narrowed gastrointestinal tract.  To reduce fecal weight and volume.  To slow the movement of feces. WHEN IS THIS DIET USED?  It may be used during the acute phase of Crohn disease, ulcerative colitis, regional enteritis, or diverticulitis.  It may be used if your intestinal or esophageal tubes are narrowing (stenosis).  It may be used as a transitional diet following surgery, injury (trauma), or illness. CHOOSING FOODS Check labels, especially on foods from the starch list. Often times, dietary fiber content is listed on the nutrition facts panel. Please ask your Registered Dietitian if you have questions about specific foods that are related to your condition, especially if the food is not listed on this handout. Breads and Starches  Allowed: White, [place], and pita breads, plain rolls, buns, or sweet rolls, doughnuts, waffles, pancakes, bagels. Plain muffins, biscuits, matzoth. Soda, saltine, graham crackers. Pretzels, rusks, melba toast, zwieback. Cooked cereals: cornmeal, farina, or cream cereals. Dry cereals: refined corn, wheat, rice, and oat cereals (check label). Potatoes prepared any way without skins, refined macaroni,  spaghetti, noodles, refined rice.  Avoid: Whole-wheat bread, rolls, and crackers. Multigrains, rye, bran seeds, nuts, or coconut. Cereals containing whole grains, multigrains, bran, coconut, nuts, raisins. Cooked or dry oatmeal. Coarse wheat cereals, granola. Cereals advertised as "high fiber." Potato skins. Whole-grain pasta, wild or brown rice. Popcorn. Vegetables  Allowed: Strained tomato and vegetable juices. Fresh lettuce, cucumber, spinach. Well-cooked or canned: asparagus, bean sprouts, broccoli, cut green beans, cauliflower, pumpkin, beets, mushrooms, yellow squash, tomato, tomato sauce, zucchini, turnips.Keep servings limited to  cup.  Avoid: Fresh, cooked, or canned: artichokes, baked beans, beet greens, Brussels sprouts, corn, kale, legumes, peas, sweet potatoes. Avoid large servings of any vegetables. Fruit  Allowed: All fruit juices except prune juice. Cooked or canned fruits without skin and seeds: apricots, applesauce, cantaloupe, cherries, grapefruit, grapes, kiwi, mandarin oranges, peaches, pears, fruit cocktail, pineapple, plums, watermelon. Fresh without skin: banana, grapes, cantaloupe, avocado, cherries, pineapple, kiwi, nectarines, peaches, blueberries. Keep servings limited to  cup or 1 piece.  Avoid: Fresh: apples with or without skin, apricots, mangoes, pears, raspberries, strawberries. Prune juice and juices with pulp, stewed or dried prunes. Dried fruits, raisins, dates. Avoid large servings of all fresh fruits. Meat and Protein Substitutes  Allowed: Ground or well-cooked tender beef, ham, veal, lamb, pork, poultry. Eggs, plain cheese. Fish, oysters, shrimp, lobster, other seafood. Liver, organ meats. Smooth nut butters.  Avoid: Tough, fibrous meats with gristle. Chunky nut butter.Cheese with seeds, nuts, or other foods not allowed. Nuts, seeds, legumes, dried peas, beans, lentils. Dairy  Allowed: All milk products except those not allowed.  Avoid: Yogurt or  cheese that contains nuts, seeds, or added fruit. Soups and Combination  Foods  Allowed: Bouillon, broth, or cream soups made from allowed foods. Any strained soup. Casseroles or mixed dishes made with allowed foods.  Avoid: Soups made from vegetables that are not allowed or that contain other foods not allowed. Desserts and Sweets  Allowed:Plain cakes and cookies, pie made with allowed fruit, pudding, custard, cream pie. Gelatin, fruit, ice, sherbet, frozen ice pops. Ice cream, ice milk without nuts. Plain hard candy, honey, jelly, molasses, syrup, sugar, chocolate syrup, gumdrops, marshmallows.  Avoid: Desserts, cookies, or candies that contain nuts, peanut butter, dried fruits. Jams, preserves with seeds, marmalade. Fats and Oils  Allowed:Margarine, butter, cream, mayonnaise, salad oils, plain salad dressings made from allowed foods.  Avoid: Seeds, nuts, olives. Beverages  Allowed: All, except those listed to avoid.  Avoid: Fruit juices with high pulp, prune juice. Condiments  Allowed:Ketchup, mustard, horseradish, vinegar, cream sauce, cheese sauce, cocoa powder. Spices in moderation: allspice, basil, bay leaves, celery powder or leaves, cinnamon, cumin powder, curry powder, ginger, mace, marjoram, onion or garlic powder, oregano, paprika, parsley flakes, ground pepper, rosemary, sage, savory, tarragon, thyme, turmeric.  Avoid: Coconut, pickles. SAMPLE MENU Breakfast   cup orange juice.  1 boiled egg.  1 slice white toast.  Margarine.   cup cornflakes.  1 cup milk.  Beverage. Lunch   cup chicken noodle soup.  2 to 3 oz sliced roast beef.  2 slices white bread.  Mayonnaise.   cup tomato juice.  1 small banana.  Beverage. Dinner  3 oz baked chicken.   cup scalloped potatoes.   cup cooked beets.  White dinner roll.  Margarine.   cup canned peaches.  Beverage. Document Released: 12/27/2001 Document Revised: 03/09/2013 Document  Reviewed: 07/24/2011 [employer] [facility] Patient Information 2014 [place].

## 2013-08-17 ENCOUNTER — Telehealth: Payer: Self-pay | Admitting: Internal Medicine

## 2013-08-17 NOTE — Telephone Encounter (Signed)
Per pharmacist, [facility] has script, it is just not covered by insurance. Prior authorization # is [id number]. ID: [id number].

## 2013-08-18 ENCOUNTER — Encounter (HOSPITAL_COMMUNITY)
Admission: RE | Admit: 2013-08-18 | Discharge: 2013-08-18 | Disposition: A | Discharge status: Home / Self Care | Payer: Self-pay | Source: Ambulatory Visit | Attending: Cardiovascular Disease | Admitting: Cardiovascular Disease

## 2013-08-18 NOTE — Telephone Encounter (Signed)
I have spoken to [employer] (phone number [phone]). I have done a prior authorization request over the phone. I have been told that prior auth request will be sent for clinical review and we will receive a response once decision has been made.

## 2013-08-19 ENCOUNTER — Encounter (HOSPITAL_COMMUNITY)
Admission: RE | Admit: 2013-08-19 | Discharge: 2013-08-19 | Disposition: A | Discharge status: Home / Self Care | Payer: Self-pay | Source: Ambulatory Visit | Attending: Cardiovascular Disease | Admitting: Cardiovascular Disease

## 2013-08-23 ENCOUNTER — Encounter (HOSPITAL_COMMUNITY)
Admission: RE | Admit: 2013-08-23 | Discharge: 2013-08-23 | Disposition: A | Discharge status: Home / Self Care | Payer: Medicare Other | Source: Ambulatory Visit | Attending: Cardiovascular Disease | Admitting: Cardiovascular Disease

## 2013-08-23 DIAGNOSIS — E785 Hyperlipidemia, unspecified: Secondary | ICD-10-CM | POA: Insufficient documentation

## 2013-08-23 DIAGNOSIS — Z7901 Long term (current) use of anticoagulants: Secondary | ICD-10-CM | POA: Insufficient documentation

## 2013-08-23 DIAGNOSIS — I059 Rheumatic mitral valve disease, unspecified: Secondary | ICD-10-CM | POA: Insufficient documentation

## 2013-08-23 DIAGNOSIS — I252 Old myocardial infarction: Secondary | ICD-10-CM | POA: Insufficient documentation

## 2013-08-23 DIAGNOSIS — I5181 Takotsubo syndrome: Secondary | ICD-10-CM | POA: Insufficient documentation

## 2013-08-23 DIAGNOSIS — Z5189 Encounter for other specified aftercare: Secondary | ICD-10-CM | POA: Insufficient documentation

## 2013-08-23 DIAGNOSIS — I10 Essential (primary) hypertension: Secondary | ICD-10-CM | POA: Insufficient documentation

## 2013-08-23 NOTE — Telephone Encounter (Signed)
Patient advised that Per [facility], her prescription did go through with a $23.00 copay. Patient verbalizes understanding.

## 2013-08-24 ENCOUNTER — Ambulatory Visit: Payer: Medicare Other | Admitting: Internal Medicine

## 2013-08-25 ENCOUNTER — Encounter (HOSPITAL_COMMUNITY)
Admission: RE | Admit: 2013-08-25 | Discharge: 2013-08-25 | Disposition: A | Discharge status: Home / Self Care | Payer: Medicare Other | Source: Ambulatory Visit | Attending: Cardiovascular Disease | Admitting: Cardiovascular Disease

## 2013-08-26 ENCOUNTER — Encounter (HOSPITAL_COMMUNITY): Payer: Medicare Other

## 2013-08-29 DIAGNOSIS — R9389 Abnormal findings on diagnostic imaging of other specified body structures: Secondary | ICD-10-CM | POA: Insufficient documentation

## 2013-08-29 DIAGNOSIS — J111 Influenza due to unidentified influenza virus with other respiratory manifestations: Secondary | ICD-10-CM | POA: Insufficient documentation

## 2013-08-29 DIAGNOSIS — R509 Fever, unspecified: Secondary | ICD-10-CM | POA: Insufficient documentation

## 2013-08-30 ENCOUNTER — Encounter (HOSPITAL_COMMUNITY): Payer: Medicare Other

## 2013-08-31 ENCOUNTER — Telehealth: Payer: Self-pay | Admitting: Internal Medicine

## 2013-09-01 ENCOUNTER — Encounter (HOSPITAL_COMMUNITY): Payer: Medicare Other

## 2013-09-02 ENCOUNTER — Encounter (HOSPITAL_COMMUNITY): Payer: Medicare Other

## 2013-09-02 ENCOUNTER — Emergency Department (HOSPITAL_COMMUNITY)
Admission: EM | Admit: 2013-09-02 | Discharge: 2013-09-02 | Disposition: A | Discharge status: Home / Self Care | Payer: Medicare Other | Source: Home / Self Care | Attending: Family Medicine | Admitting: Family Medicine

## 2013-09-02 ENCOUNTER — Emergency Department (INDEPENDENT_AMBULATORY_CARE_PROVIDER_SITE_OTHER): Payer: Medicare Other

## 2013-09-02 ENCOUNTER — Encounter (HOSPITAL_COMMUNITY): Payer: Self-pay | Admitting: Emergency Medicine

## 2013-09-02 DIAGNOSIS — R05 Cough: Secondary | ICD-10-CM

## 2013-09-02 DIAGNOSIS — J189 Pneumonia, unspecified organism: Secondary | ICD-10-CM

## 2013-09-02 DIAGNOSIS — R059 Cough, unspecified: Secondary | ICD-10-CM

## 2013-09-02 DIAGNOSIS — J111 Influenza due to unidentified influenza virus with other respiratory manifestations: Secondary | ICD-10-CM

## 2013-09-02 MED ORDER — HYDROCODONE-ACETAMINOPHEN 5-325 MG PO TABS: 0.5000 | ORAL_TABLET | Freq: Four times a day (QID) | ORAL | Status: DC | PRN

## 2013-09-02 MED ORDER — AZITHROMYCIN 250 MG PO TABS: 250.0000 mg | ORAL_TABLET | Freq: Every day | ORAL | Status: DC

## 2013-09-02 MED ORDER — HYDROCODONE-ACETAMINOPHEN 5-325 MG PO TABS
ORAL_TABLET | ORAL | Status: AC
  Filled 2013-09-02: qty 1

## 2013-09-02 MED ORDER — HYDROCODONE-ACETAMINOPHEN 5-325 MG PO TABS
1.0000 | ORAL_TABLET | Freq: Once | ORAL | Status: AC
  Administered 2013-09-02: 1 via ORAL

## 2013-09-02 NOTE — ED Notes (Signed)
C/o non productive cough that started last Thursday. Stated that she went to her PCP on Monday and was told she has the flu. States that she has taking Tama flu and cough medicine, but the cough hasn't gone away. Written by: Lenore Manner, SMA

## 2013-09-02 NOTE — Discharge Instructions (Signed)
Thank you for coming in today. Use the norco for cough as needed.  Take azithromycin daily for 5 days.  Use a humidifier.  Come back as needed.  Call or go to the emergency room if you get worse, have trouble breathing, have chest pains, or palpitations.   Influenza, Adult Influenza ("the flu") is a viral infection of the respiratory tract. It occurs more often in winter months because people spend more time in close contact with one another. Influenza can make you feel very sick. Influenza easily spreads from person to person (contagious). CAUSES  Influenza is caused by a virus that infects the respiratory tract. You can catch the virus by breathing in droplets from an infected person's cough or sneeze. You can also catch the virus by touching something that was recently contaminated with the virus and then touching your mouth, nose, or eyes. SYMPTOMS  Symptoms typically last 4 to 10 days and may include:  Fever.  Chills.  Headache, body aches, and muscle aches.  Sore throat.  Chest discomfort and cough.  Poor appetite.  Weakness or feeling tired.  Dizziness.  Nausea or vomiting. DIAGNOSIS  Diagnosis of influenza is often made based on your history and a physical exam. A nose or throat swab test can be done to confirm the diagnosis. RISKS AND COMPLICATIONS You may be at risk for a more severe case of influenza if you smoke cigarettes, have diabetes, have chronic heart disease (such as heart failure) or lung disease (such as asthma), or if you have a weakened immune system. Elderly people and pregnant women are also at risk for more serious infections. The most common complication of influenza is a lung infection (pneumonia). Sometimes, this complication can require emergency medical care and may be life-threatening. PREVENTION  An annual influenza vaccination (flu shot) is the best way to avoid getting influenza. An annual flu shot is now routinely recommended for all adults in  the [place] TREATMENT  In mild cases, influenza goes away on its own. Treatment is directed at relieving symptoms. For more severe cases, your caregiver may prescribe antiviral medicines to shorten the sickness. Antibiotic medicines are not effective, because the infection is caused by a virus, not by bacteria. HOME CARE INSTRUCTIONS  Only take over-the-counter or prescription medicines for pain, discomfort, or fever as directed by your caregiver.  Use a cool mist humidifier to make breathing easier.  Get plenty of rest until your temperature returns to normal. This usually takes 3 to 4 days.  Drink enough fluids to keep your urine clear or pale yellow.  Cover your mouth and nose when coughing or sneezing, and wash your hands well to avoid spreading the virus.  Stay home from work or school until your fever has been gone for at least 1 full day. SEEK MEDICAL CARE IF:   You have chest pain or a deep cough that worsens or produces more mucus.  You have nausea, vomiting, or diarrhea. SEEK IMMEDIATE MEDICAL CARE IF:   You have difficulty breathing, shortness of breath, or your skin or nails turn bluish.  You have severe neck pain or stiffness.  You have a severe headache, facial pain, or earache.  You have a worsening or recurring fever.  You have nausea or vomiting that cannot be controlled. MAKE SURE YOU:  Understand these instructions.  Will watch your condition.  Will get help right away if you are not doing well or get worse. Document Released: 07/04/2000 Document Revised: 01/06/2012 Document Reviewed: 10/06/2011  [employer] Patient Information 2014 [place].

## 2013-09-02 NOTE — ED Provider Notes (Signed)
Kirsten Rice is a 68 y.o. female who presents to Urgent Care today for severe cough.  Patient has had cough and congestion for approximately 7 days. She was seen on Monday of this week and diagnosed with influenza by her primary care provider. She was given Tamiflu and asked to continue over-the-counter cough medications. She notes that she still has a very bothersome cough. This is interfering with sleep and is causing abdominal and rib pain. She denies shortness of breath nausea vomiting or diarrhea. She feels very fatigued and weak. Of note she had a abdominal CT scan to followup her Crohn's disease 2 weeks ago which showed atypical pneumonia of the middle lobe. This was not treated. She has a medical history significant for Crohn's disease and multiple other problems. She is immunocompromised on Humira and prednisone.    Past Medical History  Diagnosis Date  . Takotsubo cardiomyopathy 2007  . Raynaud's disease   . Mitral valve prolapse   . Crohn's disease   . Deep vein thrombosis     pt denies: 04/08/13  . Telangiectasia   . Hyperthyroidism     pt denies:  04/08/13  . GERD (gastroesophageal reflux disease)   . Myocardial infarction   . Migraines     occular   History  Substance Use Topics  . Smoking status: Never Smoker   . Smokeless tobacco: Never Used  . Alcohol Use: 4.2 oz/week    7 Glasses of wine per week     Comment: glass of wine daily    ROS as above Medications: No current facility-administered medications for this encounter.   Current Outpatient Prescriptions  Medication Sig Dispense Refill  . adalimumab (HUMIRA PEN) 40 MG/0.8ML injection INJECT ONE PEN SUBCUTANEOUSLY EVERY 14 DAYS REFRIGERATE      . adalimumab (HUMIRA) 40 MG/0.8ML injection Inject 0.8 mLs (40 mg total) into the skin every 14 (fourteen) days.  6 each  0  . aspirin 81 MG tablet Take 81 mg by mouth daily.        . balsalazide (COLAZAL) 750 MG capsule Take 8 tablets by mouth daily  360 capsule  1  .  Calcium-Vitamin D 600-200 MG-UNIT per tablet Take 2 tablets by mouth daily.      . furosemide (LASIX) 20 MG tablet Take 1 tablet (20 mg total) by mouth daily.  90 tablet  3  . Omega-3 Fatty Acids (FISH OIL) 1200 MG CAPS Take by mouth daily.        . pantoprazole (PROTONIX) 40 MG tablet Take 1 TABLET BY MOUTH DAILY  90 tablet  1  . predniSONE (DELTASONE) 20 MG tablet Take 1 tablet (20 mg total) by mouth as directed.  50 tablet  0  . ALPRAZolam (XANAX) 0.5 MG tablet Take 0.5 tablet by mouth every morning and 1 tablet every night while on prednisone  60 tablet  0  . azithromycin (ZITHROMAX) 250 MG tablet Take 1 tablet (250 mg total) by mouth daily. Take first 2 tablets together, then 1 every day until finished.  6 tablet  0  . benazepril (LOTENSIN) 10 MG tablet TAKE 1/2 TABLET BY MOUTH DAILY  90 tablet  3  . Biotin 2500 MCG CAPS Take by mouth once.      . dicyclomine (BENTYL) 10 MG capsule Take one po BID  60 capsule  1  . diphenoxylate-atropine (LOMOTIL) 2.5-0.025 MG per tablet Take 1 tablet by mouth 2 (two) times daily.  60 tablet  1  . HYDROcodone-acetaminophen (NORCO/VICODIN)  5-325 MG per tablet Take 0.5-1 tablets by mouth every 6 (six) hours as needed (cough).  15 tablet  0  . meloxicam (MOBIC) 7.5 MG tablet Take 1 tablet (7.5 mg total) by mouth as needed for pain (headache).  30 tablet  3  . potassium chloride SA (K-DUR,KLOR-CON) 20 MEQ tablet TAKE 1 TABLET BY MOUTH DAILY  90 tablet  3  . Probiotic Product (ALIGN) 4 MG CAPS Take 1 capsule by mouth daily.      . promethazine (PHENERGAN) 12.5 MG tablet Take 1 tablet by mouth every 4-6 hours as needed  30 tablet  1  . tiZANidine (ZANAFLEX) 4 MG tablet TAKE 1 TABLET BY MOUTH AS DIRECTED  385 tablet  3  . vitamin C (ASCORBIC ACID) 500 MG tablet Take 500 mg by mouth daily.          Exam:  BP 126/82  Pulse 84  Temp(Src) 97.6 F (36.4 C) (Oral)  Resp 18  SpO2 100% Gen:  NAD fatigued and frail-appearing HEENT: EOMI,  MMM Lungs: Normal work of  breathing. CTABL frequent coughing Heart: RRR no MRG Abd: NABS, Soft. NT, ND Exts: Brisk capillary refill, warm and well perfused.   No results found for this or any previous visit (from the past 24 hour(s)). Dg Chest 2 View  09/02/2013   CLINICAL DATA:  Cough  EXAM: CHEST  2 VIEW  COMPARISON:  11/24/2005  FINDINGS: Cardiac shadow is stable. The lungs are again mildly hyperinflated. 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 This accentuates some of the lung markings. No focal infiltrate is seen. No other bony abnormality is noted.  IMPRESSION: No active cardiopulmonary disease.   Electronically Signed   By: Inez Catalina M.D.   On: 09/02/2013 11:57    Assessment and Plan: 68 y.o. female with influenza with severe cough. Patient may have a component of atypical pneumonia based on the previous CT scan.  Plan to treat with azithromycin. Additionally we will control  cough with Norco. Followup with primary care provider  Discussed warning signs or symptoms. Please see discharge instructions. Patient expresses understanding.    Gregor Hams, MD 09/02/13 1318

## 2013-09-06 ENCOUNTER — Encounter (HOSPITAL_COMMUNITY): Payer: Medicare Other

## 2013-09-08 ENCOUNTER — Encounter (HOSPITAL_COMMUNITY): Payer: Medicare Other

## 2013-09-09 ENCOUNTER — Encounter (HOSPITAL_COMMUNITY): Admission: RE | Admit: 2013-09-09 | Payer: Medicare Other | Source: Ambulatory Visit

## 2013-09-09 NOTE — Telephone Encounter (Signed)
Left message with female for patient to call back. I must have clicked "completed" on phone note in error originally as this was sent on 08/31/13.

## 2013-09-12 NOTE — Telephone Encounter (Signed)
Left message for patient to call back  

## 2013-09-13 ENCOUNTER — Encounter (HOSPITAL_COMMUNITY): Payer: Medicare Other

## 2013-09-13 ENCOUNTER — Emergency Department (HOSPITAL_COMMUNITY)
Admission: EM | Admit: 2013-09-13 | Discharge: 2013-09-15 | Disposition: A | Discharge status: Home / Self Care | Payer: Medicare Other | Attending: Emergency Medicine | Admitting: Emergency Medicine

## 2013-09-13 ENCOUNTER — Emergency Department (HOSPITAL_COMMUNITY): Payer: Medicare Other

## 2013-09-13 ENCOUNTER — Encounter (HOSPITAL_COMMUNITY): Payer: Self-pay | Admitting: Emergency Medicine

## 2013-09-13 DIAGNOSIS — Z7982 Long term (current) use of aspirin: Secondary | ICD-10-CM | POA: Insufficient documentation

## 2013-09-13 DIAGNOSIS — K219 Gastro-esophageal reflux disease without esophagitis: Secondary | ICD-10-CM | POA: Insufficient documentation

## 2013-09-13 DIAGNOSIS — IMO0002 Reserved for concepts with insufficient information to code with codable children: Secondary | ICD-10-CM | POA: Insufficient documentation

## 2013-09-13 DIAGNOSIS — B349 Viral infection, unspecified: Secondary | ICD-10-CM

## 2013-09-13 DIAGNOSIS — B9789 Other viral agents as the cause of diseases classified elsewhere: Secondary | ICD-10-CM | POA: Insufficient documentation

## 2013-09-13 DIAGNOSIS — I252 Old myocardial infarction: Secondary | ICD-10-CM | POA: Insufficient documentation

## 2013-09-13 DIAGNOSIS — Z862 Personal history of diseases of the blood and blood-forming organs and certain disorders involving the immune mechanism: Secondary | ICD-10-CM | POA: Insufficient documentation

## 2013-09-13 DIAGNOSIS — R51 Headache: Secondary | ICD-10-CM | POA: Insufficient documentation

## 2013-09-13 DIAGNOSIS — Z86718 Personal history of other venous thrombosis and embolism: Secondary | ICD-10-CM | POA: Insufficient documentation

## 2013-09-13 DIAGNOSIS — IMO0001 Reserved for inherently not codable concepts without codable children: Secondary | ICD-10-CM | POA: Insufficient documentation

## 2013-09-13 DIAGNOSIS — Z79899 Other long term (current) drug therapy: Secondary | ICD-10-CM | POA: Insufficient documentation

## 2013-09-13 DIAGNOSIS — R1110 Vomiting, unspecified: Secondary | ICD-10-CM

## 2013-09-13 DIAGNOSIS — R05 Cough: Secondary | ICD-10-CM

## 2013-09-13 DIAGNOSIS — Z8639 Personal history of other endocrine, nutritional and metabolic disease: Secondary | ICD-10-CM | POA: Insufficient documentation

## 2013-09-13 DIAGNOSIS — R059 Cough, unspecified: Secondary | ICD-10-CM

## 2013-09-13 DIAGNOSIS — K5090 Crohn's disease, unspecified, without complications: Secondary | ICD-10-CM | POA: Insufficient documentation

## 2013-09-13 LAB — CBC WITH DIFFERENTIAL/PLATELET
BASOS PCT: 0 % (ref 0–1)
Basophils Absolute: 0 10*3/uL (ref 0.0–0.1)
EOS ABS: 0 10*3/uL (ref 0.0–0.7)
Eosinophils Relative: 0 % (ref 0–5)
HCT: 40.4 % (ref 36.0–46.0)
Hemoglobin: 14.2 g/dL (ref 12.0–15.0)
Lymphocytes Relative: 16 % (ref 12–46)
Lymphs Abs: 1 10*3/uL (ref 0.7–4.0)
MCH: 31.6 pg (ref 26.0–34.0)
MCHC: 35.1 g/dL (ref 30.0–36.0)
MCV: 90 fL (ref 78.0–100.0)
MONOS PCT: 10 % (ref 3–12)
Monocytes Absolute: 0.7 10*3/uL (ref 0.1–1.0)
Neutro Abs: 4.7 10*3/uL (ref 1.7–7.7)
Neutrophils Relative %: 73 % (ref 43–77)
Platelets: 263 10*3/uL (ref 150–400)
RBC: 4.49 MIL/uL (ref 3.87–5.11)
RDW: 12.8 % (ref 11.5–15.5)
WBC: 6.5 10*3/uL (ref 4.0–10.5)

## 2013-09-13 LAB — COMPREHENSIVE METABOLIC PANEL
ALBUMIN: 3.9 g/dL (ref 3.5–5.2)
ALT: 6 U/L (ref 0–35)
AST: 11 U/L (ref 0–37)
Alkaline Phosphatase: 37 U/L — ABNORMAL LOW (ref 39–117)
BUN: 11 mg/dL (ref 6–23)
CO2: 29 mEq/L (ref 19–32)
Calcium: 9.2 mg/dL (ref 8.4–10.5)
Chloride: 87 mEq/L — ABNORMAL LOW (ref 96–112)
Creatinine, Ser: 0.73 mg/dL (ref 0.50–1.10)
GFR calc Af Amer: >90 mL/min (ref >90)
GFR calc non Af Amer: 85 mL/min — ABNORMAL LOW (ref >90)
Glucose, Bld: 113 mg/dL — ABNORMAL HIGH (ref 70–99)
Potassium: 3.4 mEq/L — ABNORMAL LOW (ref 3.7–5.3)
SODIUM: 130 meq/L — AB (ref 137–147)
TOTAL PROTEIN: 7.2 g/dL (ref 6.0–8.3)
Total Bilirubin: 0.9 mg/dL (ref 0.3–1.2)

## 2013-09-13 LAB — URINE MICROSCOPIC-ADD ON

## 2013-09-13 LAB — LACTIC ACID, PLASMA: LACTIC ACID, VENOUS: 0.7 mmol/L (ref 0.5–2.2)

## 2013-09-13 LAB — HEPATIC FUNCTION PANEL
ALT: 6 U/L (ref 0–35)
AST: 12 U/L (ref 0–37)
Albumin: 3.6 g/dL (ref 3.5–5.2)
Alkaline Phosphatase: 34 U/L — ABNORMAL LOW (ref 39–117)
Bilirubin, Direct: <0.2 mg/dL (ref 0.0–0.3)
Total Bilirubin: 0.9 mg/dL (ref 0.3–1.2)
Total Protein: 6.5 g/dL (ref 6.0–8.3)

## 2013-09-13 LAB — URINALYSIS, ROUTINE W REFLEX MICROSCOPIC
Bilirubin Urine: NEGATIVE
GLUCOSE, UA: NEGATIVE mg/dL
Hgb urine dipstick: NEGATIVE
KETONES UR: NEGATIVE mg/dL
Nitrite: NEGATIVE
PH: 7.5 (ref 5.0–8.0)
Protein, ur: NEGATIVE mg/dL
Specific Gravity, Urine: 1.016 (ref 1.005–1.030)
Urobilinogen, UA: 0.2 mg/dL (ref 0.0–1.0)

## 2013-09-13 MED ORDER — IOHEXOL 300 MG/ML  SOLN
25.0000 mL | INTRAMUSCULAR | Status: AC
  Administered 2013-09-13: 25 mL via ORAL

## 2013-09-13 MED ORDER — ONDANSETRON HCL 4 MG PO TABS: 4.0000 mg | ORAL_TABLET | Freq: Four times a day (QID) | ORAL | Status: DC

## 2013-09-13 MED ORDER — BENZONATATE 100 MG PO CAPS: 100.0000 mg | ORAL_CAPSULE | Freq: Three times a day (TID) | ORAL | Status: DC

## 2013-09-13 MED ORDER — SODIUM CHLORIDE 0.9 % IV BOLUS (SEPSIS)
1000.0000 mL | Freq: Once | INTRAVENOUS | Status: AC
  Administered 2013-09-13: 1000 mL via INTRAVENOUS

## 2013-09-13 MED ORDER — BENZONATATE 100 MG PO CAPS
200.0000 mg | ORAL_CAPSULE | Freq: Once | ORAL | Status: AC
  Administered 2013-09-13: 200 mg via ORAL
  Filled 2013-09-13: qty 2

## 2013-09-13 MED ORDER — POTASSIUM CHLORIDE 10 MEQ/100ML IV SOLN
10.0000 meq | Freq: Once | INTRAVENOUS | Status: AC
  Administered 2013-09-13: 10 meq via INTRAVENOUS
  Filled 2013-09-13: qty 100

## 2013-09-13 MED ORDER — ACETAMINOPHEN 325 MG PO TABS
650.0000 mg | ORAL_TABLET | Freq: Once | ORAL | Status: AC
  Administered 2013-09-13: 650 mg via ORAL
  Filled 2013-09-13: qty 2

## 2013-09-13 MED ORDER — ONDANSETRON HCL 4 MG/2ML IJ SOLN
4.0000 mg | Freq: Once | INTRAMUSCULAR | Status: AC
  Administered 2013-09-13: 4 mg via INTRAVENOUS
  Filled 2013-09-13: qty 2

## 2013-09-13 MED ORDER — IOHEXOL 300 MG/ML  SOLN
80.0000 mL | Freq: Once | INTRAMUSCULAR | Status: AC | PRN
  Administered 2013-09-13: 80 mL via INTRAVENOUS

## 2013-09-13 NOTE — ED Provider Notes (Signed)
CSN: [id number]     Arrival date & time 09/13/13  1343 History   First MD Initiated Contact with Patient 09/13/13 1458     Chief Complaint  Patient presents with  . Influenza     (Consider location/radiation/quality/duration/timing/severity/associated sxs/prior Treatment) HPI  This is a 68 year old female with history of Crohn's disease on prednisone and Humira, DVT, and Takotsubo's cardiomyopathy who presents with cough. Patient reports that she was diagnosed the fluid on February 8. At that time she was given Tamiflu. She reports a persistent dry cough that has not gotten any better. Patient states that initially she had fevers and chills when she was diagnosed with the flu but that has improved. Yesterday patient noted temperature 100.2. Patient was seen in urgent care on February 13 and given azithromycin for atypical pneumonia. She is to decide course of antibiotics as well. Patient reports nonbilious, nonbloody emesis yesterday. She also reports myalgias and headache. She denies any vision changes, weakness, numbness, or tingling.  Patient denies any shortness or breath chest pain.  Past Medical History  Diagnosis Date  . Takotsubo cardiomyopathy 2007  . Raynaud's disease   . Mitral valve prolapse   . Crohn's disease   . Deep vein thrombosis     pt denies: 04/08/13  . Telangiectasia   . Hyperthyroidism     pt denies:  04/08/13  . GERD (gastroesophageal reflux disease)   . Myocardial infarction   . Migraines     occular   Past Surgical History  Procedure Laterality Date  . Shoulder surgery  1996    right  . Tubal ligation  1973  . Appendectomy  1971  . Colonoscopy    . Colonoscopy  05/11/2012    Procedure: COLONOSCOPY;  Surgeon: Lafayette Dragon, MD;  Location: [facility];  Service: Endoscopy;  Laterality: N/A;   Family History  Problem Relation Age of Onset  . Aortic stenosis Father   . Hypertension Father   . Colon cancer Neg Hx   . Diabetes Sister   .  Hyperthyroidism Sister   . Diabetes Sister   . Hyperthyroidism Sister   . Hypertension Mother   . Hyperthyroidism Sister   . Hyperthyroidism Sister    History  Substance Use Topics  . Smoking status: Never Smoker   . Smokeless tobacco: Never Used  . Alcohol Use: 4.2 oz/week    7 Glasses of wine per week     Comment: glass of wine daily    OB History   Grav Para Term Preterm Abortions TAB SAB Ect Mult Living                 Review of Systems  Constitutional: Positive for fever.  Respiratory: Positive for cough. Negative for chest tightness and shortness of breath.   Cardiovascular: Negative for chest pain.  Gastrointestinal: Positive for nausea and vomiting. Negative for abdominal pain and diarrhea.  Genitourinary: Negative for dysuria.  Musculoskeletal: Positive for back pain and myalgias.  Skin: Negative for rash.  Neurological: Positive for headaches.  Psychiatric/Behavioral: Negative for confusion.  All other systems reviewed and are negative.      Allergies  Cafergot; Compazine; Prochlorperazine edisylate; and Tetanus-diphtheria toxoids td  Home Medications   Current Outpatient Rx  Name  Route  Sig  Dispense  Refill  . aspirin 81 MG tablet   Oral   Take 81 mg by mouth daily.           . balsalazide (COLAZAL) 750 MG capsule  Take 8 tablets by mouth daily   360 capsule   1   . benazepril (LOTENSIN) 10 MG tablet   Oral   Take 5 mg by mouth daily.         . Biotin 2500 MCG CAPS   Oral   Take 2,500 mcg by mouth daily.          . Calcium-Vitamin D 600-200 MG-UNIT per tablet   Oral   Take 2 tablets by mouth daily.         . Chlorpheniramine-APAP (CORICIDIN) 2-325 MG TABS   Oral   Take 2 tablets by mouth daily as needed (for cold).         Marland Kitchen dextromethorphan-guaiFENesin (ROBITUSSIN COLD COUGH+ CHEST) 10-100 MG/5ML liquid   Oral   Take 10 mLs by mouth every 4 (four) hours as needed for cough.         . furosemide (LASIX) 20 MG  tablet   Oral   Take 1 tablet (20 mg total) by mouth daily.   90 tablet   3   . HYDROcodone-acetaminophen (NORCO/VICODIN) 5-325 MG per tablet   Oral   Take 0.5-1 tablets by mouth every 6 (six) hours as needed (cough).   15 tablet   0   . pantoprazole (PROTONIX) 40 MG tablet   Oral   Take 40 mg by mouth daily.         . potassium chloride SA (K-DUR,KLOR-CON) 20 MEQ tablet   Oral   Take 20 mEq by mouth daily.         . predniSONE (DELTASONE) 20 MG tablet   Oral   Take 20 mg by mouth daily with breakfast.         . vitamin C (ASCORBIC ACID) 500 MG tablet   Oral   Take 500 mg by mouth daily.           . benzonatate (TESSALON) 100 MG capsule   Oral   Take 1 capsule (100 mg total) by mouth every 8 (eight) hours.   21 capsule   0   . ondansetron (ZOFRAN) 4 MG tablet   Oral   Take 1 tablet (4 mg total) by mouth every 6 (six) hours.   12 tablet   0    BP 143/76  Pulse 74  Temp(Src) 99 F (37.2 C) (Oral)  Resp 18  SpO2 96% Physical Exam  Nursing note and vitals reviewed. Constitutional: She is oriented to person, place, and time.  Ill-appearing, no acute distress  HENT:  Head: Normocephalic and atraumatic.  Mucous membranes dry  Eyes: Pupils are equal, round, and reactive to light.  Neck: Neck supple.  Cardiovascular: Normal rate, regular rhythm and normal heart sounds.   No murmur heard. Pulmonary/Chest: Effort normal and breath sounds normal. No respiratory distress. She has no wheezes. She has no rales. She exhibits no tenderness.  Abdominal: Soft. Bowel sounds are normal. There is no tenderness. There is no rebound and no guarding.  Musculoskeletal: She exhibits no edema.  Neurological: She is alert and oriented to person, place, and time.  Skin: Skin is warm and dry.  Psychiatric: She has a normal mood and affect.    ED Course  Procedures (including critical care time) Labs Review Labs Reviewed  COMPREHENSIVE METABOLIC PANEL - Abnormal; Notable  for the following:    Sodium 130 (*)    Potassium 3.4 (*)    Chloride 87 (*)    Glucose, Bld 113 (*)    Alkaline  Phosphatase 37 (*)    GFR calc non Af Amer 85 (*)    All other components within normal limits  URINALYSIS, ROUTINE W REFLEX MICROSCOPIC - Abnormal; Notable for the following:    Leukocytes, UA SMALL (*)    All other components within normal limits  URINE MICROSCOPIC-ADD ON - Abnormal; Notable for the following:    Squamous Epithelial / LPF FEW (*)    Bacteria, UA FEW (*)    All other components within normal limits  HEPATIC FUNCTION PANEL - Abnormal; Notable for the following:    Alkaline Phosphatase 34 (*)    All other components within normal limits  CBC WITH DIFFERENTIAL  LACTIC ACID, PLASMA   Imaging Review Dg Chest 2 View  09/13/2013   CLINICAL DATA:  INFLUENZA  EXAM: CHEST  2 VIEW  COMPARISON:  DG CHEST 2 VIEW dated 09/02/2013  FINDINGS: The heart size and mediastinal contours are within normal limits. Both lungs are clear, and are mildly hyperinflated, stable. 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 The osseous structures unremarkable.  IMPRESSION: No active cardiopulmonary disease.   Electronically Signed   By: Margaree Mackintosh M.D.   On: 09/13/2013 14:20   Ct Abdomen Pelvis W Contrast  09/13/2013   CLINICAL DATA:  Flu.  Vomiting.  Fever.  History of Crohn disease.  EXAM: CT ABDOMEN AND PELVIS WITH CONTRAST  TECHNIQUE: Multidetector CT imaging of the abdomen and pelvis was performed using the standard protocol following bolus administration of intravenous contrast.  CONTRAST:  3mL OMNIPAQUE IOHEXOL 300 MG/ML  SOLN  COMPARISON:  08/15/2013.  FINDINGS: No focal abnormality is seen in the liver or spleen. The stomach, duodenum, pancreas, gallbladder, and adrenal glands are unremarkable. Tiny cortical lesions in the right kidney are too small to characterize but are unchanged in the interval. Low-density lesion in the left kidney appears to have a subtle internal  septation. This measures 3.5 cm in maximum diameter.  Small retroperitoneal lymph nodes are evident. There is no free fluid in the abdomen.  Imaging through the pelvis shows a distended bladder. There is no pelvic sidewall lymphadenopathy. Uterus is unremarkable. No evidence for an adnexal mass.  Sigmoid colon is redundant. No evidence for substantial diverticular change in the left colon. No evidence for colonic diverticulitis. The terminal ileum is unremarkable. The subtle thickening and pericolonic edema seen in the right colon on the previous study has decreased in the interval. No evidence for intraperitoneal abscess.  Bone windows reveal no worrisome lytic or sclerotic osseous lesions.  IMPRESSION: Interval improvement in the wall thickening and pericolonic edema seen around the right colon on the previous study. No new or progressive interval findings.  No evidence for bowel obstruction. No evidence for intraperitoneal abscess.  3.5 cm left renal cyst shows evidence of internal septation. This lesion has been present without substantial change since 01/23/05, suggesting a benign etiology.   Electronically Signed   By: Misty Stanley M.D.   On: 09/13/2013 19:43    EKG Interpretation   None       MDM   Final diagnoses:  Viral infection  Cough  Vomiting    Patient presents with persistent cough and vomiting. She denies any other symptoms at this time.  She is drawn exam. Vital signs are within normal limits. She is afebrile. Lab work notable for mild hyponatremia and hypokalemia and evidence of dehydration. Patient was given fluids.   6:41 PM  Patient states that she is a little better. She reports persistent  vomiting but improved cough. She continues to deny abdominal pain.  She reports normal bowel movements and denies any diarrhea. Given her history of Crohn's would be concerned for extraction or other intra-abdominal infection. Additional lab work ordered. CT scan ordered.  CT scan  negative. Patient able to tolerate by mouth without vomiting. Suspect patient's symptoms are likely secondary to a viral process. She has already had a course of azithromycin for presumed pneumonia. Chest x-ray is negative.  While she is immunosuppressed, at this time have no evidence of pneumonia and feel patient's symptoms are likely viral in nature. Patient will be given Zofran and Tessalon Perles. She is to followup with her primary care physician tomorrow.  After history, exam, and medical workup I feel the patient has been appropriately medically screened and is safe for discharge home. Pertinent diagnoses were discussed with the patient. Patient was given return precautions.    Merryl Hacker, MD 09/14/13 [phone]

## 2013-09-13 NOTE — Discharge Instructions (Signed)

## 2013-09-13 NOTE — ED Notes (Signed)
Pt was diagnosed with flu a few weeks ago. Took tamiflu with no relief. Reports still dry, hacky cough. Last night started vomiting, running a fever. Reports earlier temp 100.2. States taking humiria and prednisone for Crohn's disease.  Pt is a x 4. Mask in place.

## 2013-09-13 NOTE — ED Notes (Signed)
Report called to facility

## 2013-09-13 NOTE — ED Notes (Signed)
Pt ambulated to restroom. Reports weakness but denies any dizziness or lightheadedness.

## 2013-09-14 NOTE — Telephone Encounter (Signed)
Message copied by Larina Bras on Wed Sep 14, 2013  2:01 PM ------      Message from: Larina Bras      Created: Mon Sep 12, 2013  2:14 PM      Regarding: FW: Can we close this phone note? Did you get her?       Left message to call back.       ----- Message -----         From: Oliva Bustard         Sent: 09/09/2013  10:55 AM           To: Larina Bras, CMA      Subject: Can we close this phone note? Did you get he#                   ------

## 2013-09-14 NOTE — Telephone Encounter (Signed)
Patient is currently hospitalized for a viral infection. I have left several messages for patient to call back but unfortunately, have not gotten a return call. I will await call back from patient if she still has questions about prednisone dosing.

## 2013-09-15 ENCOUNTER — Encounter (HOSPITAL_COMMUNITY): Payer: Medicare Other

## 2013-09-16 ENCOUNTER — Encounter (HOSPITAL_COMMUNITY): Payer: Medicare Other

## 2013-09-20 ENCOUNTER — Encounter (HOSPITAL_COMMUNITY): Payer: Medicare Other

## 2013-09-20 DIAGNOSIS — I5181 Takotsubo syndrome: Secondary | ICD-10-CM | POA: Insufficient documentation

## 2013-09-20 DIAGNOSIS — I10 Essential (primary) hypertension: Secondary | ICD-10-CM | POA: Insufficient documentation

## 2013-09-20 DIAGNOSIS — Z5189 Encounter for other specified aftercare: Secondary | ICD-10-CM | POA: Insufficient documentation

## 2013-09-20 DIAGNOSIS — I252 Old myocardial infarction: Secondary | ICD-10-CM | POA: Insufficient documentation

## 2013-09-20 DIAGNOSIS — I059 Rheumatic mitral valve disease, unspecified: Secondary | ICD-10-CM | POA: Insufficient documentation

## 2013-09-20 DIAGNOSIS — E785 Hyperlipidemia, unspecified: Secondary | ICD-10-CM | POA: Insufficient documentation

## 2013-09-20 DIAGNOSIS — Z7901 Long term (current) use of anticoagulants: Secondary | ICD-10-CM | POA: Insufficient documentation

## 2013-09-22 ENCOUNTER — Encounter (HOSPITAL_COMMUNITY): Payer: Medicare Other

## 2013-09-23 ENCOUNTER — Encounter (HOSPITAL_COMMUNITY): Payer: Medicare Other

## 2013-09-27 ENCOUNTER — Encounter (HOSPITAL_COMMUNITY): Payer: Medicare Other

## 2013-09-29 ENCOUNTER — Encounter (HOSPITAL_COMMUNITY): Payer: Medicare Other

## 2013-09-30 ENCOUNTER — Encounter (HOSPITAL_COMMUNITY): Admission: RE | Admit: 2013-09-30 | Payer: Medicare Other | Source: Ambulatory Visit

## 2013-10-04 ENCOUNTER — Encounter (HOSPITAL_COMMUNITY)
Admission: RE | Admit: 2013-10-04 | Discharge: 2013-10-04 | Disposition: A | Discharge status: Home / Self Care | Payer: Self-pay | Source: Ambulatory Visit | Attending: Cardiovascular Disease | Admitting: Cardiovascular Disease

## 2013-10-06 ENCOUNTER — Encounter (HOSPITAL_COMMUNITY): Payer: Medicare Other

## 2013-10-06 ENCOUNTER — Ambulatory Visit: Payer: Medicare Other | Admitting: Neurology

## 2013-10-06 ENCOUNTER — Other Ambulatory Visit (HOSPITAL_COMMUNITY): Payer: Self-pay | Admitting: Obstetrics and Gynecology

## 2013-10-06 DIAGNOSIS — Z1231 Encounter for screening mammogram for malignant neoplasm of breast: Secondary | ICD-10-CM

## 2013-10-07 ENCOUNTER — Encounter (HOSPITAL_COMMUNITY)
Admission: RE | Admit: 2013-10-07 | Discharge: 2013-10-07 | Disposition: A | Discharge status: Home / Self Care | Payer: Self-pay | Source: Ambulatory Visit | Attending: Cardiovascular Disease | Admitting: Cardiovascular Disease

## 2013-10-10 ENCOUNTER — Ambulatory Visit (HOSPITAL_COMMUNITY): Payer: Medicare Other

## 2013-10-10 ENCOUNTER — Ambulatory Visit (INDEPENDENT_AMBULATORY_CARE_PROVIDER_SITE_OTHER): Payer: Medicare Other | Admitting: Neurology

## 2013-10-10 ENCOUNTER — Encounter: Payer: Self-pay | Admitting: Neurology

## 2013-10-10 VITALS — BP 142/82 | HR 73 | Resp 16 | Ht 67.5 in | Wt 119.0 lb

## 2013-10-10 DIAGNOSIS — G250 Essential tremor: Secondary | ICD-10-CM

## 2013-10-10 DIAGNOSIS — G43909 Migraine, unspecified, not intractable, without status migrainosus: Secondary | ICD-10-CM

## 2013-10-10 DIAGNOSIS — G252 Other specified forms of tremor: Principal | ICD-10-CM

## 2013-10-10 MED ORDER — PRIMIDONE 50 MG PO TABS: 50.0000 mg | ORAL_TABLET | Freq: Every day | ORAL | Status: DC

## 2013-10-10 NOTE — Patient Instructions (Signed)
1.  Start primidone - 50 mg - 1/2 tablet at night for 3 nights and then increase to 1 tablet nightly

## 2013-10-10 NOTE — Progress Notes (Signed)
Subjective:   Kirsten Rice was seen in consultation in the movement disorder clinic at the request of Kirsten Pao, MD.  The evaluation is for tremor.  The pt previously saw [name].  No previous notes are available from [place] to review.  The patient is a 68 y.o. right handed female with a history of tremor.  Tremor began several years ago, but worse over the last 2 years.  There is a family hx of tremor in Kirsten Rice father and in Kirsten Rice sister.  Kirsten Rice sister is on medication, ? primidone.    Affected by caffeine:  no Affected by alcohol:  no Affected by stress:  yes Affected by fatigue:  yes Spills soup if on spoon:  yes Spills glass of liquid if full:  yes Affects ADL's (tying shoes, brushing teeth, etc):  yes  Current/Previously tried tremor medications: no  Current medications that may exacerbate tremor:  Prednisone (pt feels like it makes tremor worse and is moody with the medication)  Pt states that about a month ago she woke up in the AM and saw a bright color out of the eye with a black center.  It lasted 1 min.  She has had more frequent  Episodes.  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  She states that the left side of the head feels different than the right but there is no pain or discrete sensory change.  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    07/08/13:  The pt is f/u today.  She has ET.  She was given klonopin as she only wanted a prn medication.  She never filled it as she read the potential side effects and said that she would rather shake than take it.   She is now off of the prednisone and has been off of it since mid November and was able to take Humira.  Going off the prednisone definitely decreased for tremor, but unfortunately going on Humira caused a resurgence of Kirsten Rice migraines that she had years ago.  She states that after she takes the Humira, she will get a frontal headache that feels like Kirsten Rice head will explode."  She has nausea and  vomiting.  This is like the migraines she used to get.  Independently, she also has some muscle spasm in the right side of the neck.  She has no shooting pain down the arm.  She has no electric-like pain at all.  An MRI of the brain was done since last visit.  There was moderate small vessel disease and atrophy.  The patient and I discussed this today.  10/10/13:  Pt is f/u today regarding ET.  She is not on any medications for this.  I did start Kirsten Rice on zanaflex for migraine prophylaxis with humira as well as for Kirsten Rice neck pain.  She states that the zanaflex helped but she ended up going off of it.  Overall, she is better in that regard.  She states that she had flu-like sx's for 6 weeks and ended up going off of the humira on 08/25/13.  Kirsten Rice headaches improved but she is still having them 1-2 days per week.  They are not as bothersome and generally go away with 1 small cup of coffee.  She has noted shaking again, and prednisone was increased when she was sick and she is still on 25 mg of prednisone.  She states that "I think about all the side effects of  medication but I wish my shaking was better."  Outside reports reviewed: historical medical records.  Allergies  Allergen Reactions  . Cafergot [Ergotamine-Caffeine] Other (See Comments)    Vagal paralysis  . Compazine [Prochlorperazine Edisylate] Other (See Comments)    paralysis of face  . Prochlorperazine Edisylate Other (See Comments)    REACTION: extrapyrimadal syndrome  . Tetanus-Diphtheria Toxoids Td Swelling    REACTION: arm swells   Meds:  reviewed  Past Medical History  Diagnosis Date  . Takotsubo cardiomyopathy 2007  . Raynaud's disease   . Mitral valve prolapse   . Crohn's disease   . Deep vein thrombosis     pt denies: 04/08/13  . Telangiectasia   . Hyperthyroidism     pt denies:  04/08/13  . GERD (gastroesophageal reflux disease)   . Myocardial infarction   . Migraines     occular    Past Surgical History  Procedure  Laterality Date  . Shoulder surgery  1996    right  . Tubal ligation  1973  . Appendectomy  1971  . Colonoscopy    . Colonoscopy  05/11/2012    Procedure: COLONOSCOPY;  Surgeon: Lafayette Dragon, MD;  Location: [facility];  Service: Endoscopy;  Laterality: N/A;    History   Social History  . Marital Status: Married    Spouse Name: N/A    Number of Children: N/A  . Years of Education: N/A   Occupational History  . Retired     Marine scientist   Social History Main Topics  . Smoking status: Never Smoker   . Smokeless tobacco: Never Used  . Alcohol Use: 4.2 oz/week    7 Glasses of wine per week     Comment: glass of wine daily   . Drug Use: No  . Sexual Activity: Not on file   Other Topics Concern  . Not on file   Social History Narrative  . No narrative on file    Family Status  Relation Status Death Age  . Father Deceased     Aortic stenosis  . Sister Alive     4, hyperthyroidism, cataracts, DM, tremor  . Mother Deceased     COPD, tob abuse  . Child Alive     bipolar disease    Review of Systems A complete 10 system ROS was obtained and was negative apart from what is mentioned.   Objective:   VITALS:   Filed Vitals:   10/10/13 1005  BP: 142/82  Pulse: 73  Resp: 16  Height: 5' 7.5" (1.715 m)  Weight: 119 lb (53.978 kg)   Gen:  Appears stated age and in NAD. HEENT:  Normocephalic, atraumatic. The mucous membranes are moist. The superficial temporal arteries are without ropiness or tenderness. Cardiovascular: Regular rate and rhythm. Lungs: Clear to auscultation bilaterally. Neck: There are no carotid bruits noted bilaterally.  NEUROLOGICAL:  Orientation:  The patient is alert and oriented x 3.  Recent and remote memory are intact.  Attention span and concentration are normal.  Able to name objects and repeat without trouble.  Fund of knowledge is appropriate Cranial nerves: There is good facial symmetry. The pupils are equal round and reactive to light  bilaterally. Fundoscopic exam reveals clear disc margins bilaterally. Extraocular muscles are intact and visual fields are full to confrontational testing. Speech is fluent and clear. Soft palate rises symmetrically and there is no tongue deviation. Hearing is intact to conversational tone. Tone: Tone is good throughout. Sensation: Sensation is intact  to light touch throughout. Coordination:  The patient has no dysdiadichokinesia or dysmetria. Motor: Strength is 5/5 in the bilateral upper and lower extremities.  Shoulder shrug is equal bilaterally.  There is no pronator drift.  There are no fasciculations noted. Gait and Station: The patient is able to ambulate without difficulty. The patient is able to heel toe walk without any difficulty. The patient is able to ambulate in a tandem fashion. The patient is able to stand in the Romberg position.   MOVEMENT EXAM: Tremor:  There is tremor in the bilateral upper extremities, most notable with postural changes, and increases with intention.  The right is worse than the left.  Some tremor noted with ambulation as well.    Labs: She has work from Kirsten Rice primary care physician on 03/11/2013.  Kirsten Rice TSH was 0.7 and free T4 was normal at 1.3.     Assessment/Plan:   1.  Essential Tremor.  -This is evidenced by the symmetrical nature, family history and longstanding hx of gradually getting worse.  This has been worsened by prednisone.  -The pt would like to try primidone.  Risks, benefits, side effects and alternative therapies were discussed.  The opportunity to ask questions was given and they were answered to the best of my ability.  The patient expressed understanding and willingness to follow the outlined treatment protocols.  -Talked about topamax since can help tremor and migraine but she states that headaches are not so bad now that off of humira. 2.  neck pain that sounds like muscle spasm.  -resolved with zanaflex 3.  Migraine with humira  -Off of  humira and doing better.  4.  The patient is to call me in a few weeks and let me know how she is doing.  Followup is anticipated in the next few months.

## 2013-10-11 ENCOUNTER — Encounter (HOSPITAL_COMMUNITY)
Admission: RE | Admit: 2013-10-11 | Discharge: 2013-10-11 | Disposition: A | Discharge status: Home / Self Care | Payer: Self-pay | Source: Ambulatory Visit | Attending: Cardiovascular Disease | Admitting: Cardiovascular Disease

## 2013-10-12 ENCOUNTER — Ambulatory Visit (HOSPITAL_COMMUNITY)
Admission: RE | Admit: 2013-10-12 | Discharge: 2013-10-12 | Disposition: A | Discharge status: Home / Self Care | Payer: Medicare Other | Source: Ambulatory Visit | Attending: Obstetrics and Gynecology | Admitting: Obstetrics and Gynecology

## 2013-10-12 DIAGNOSIS — Z1231 Encounter for screening mammogram for malignant neoplasm of breast: Secondary | ICD-10-CM | POA: Insufficient documentation

## 2013-10-13 ENCOUNTER — Encounter (HOSPITAL_COMMUNITY)
Admission: RE | Admit: 2013-10-13 | Discharge: 2013-10-13 | Disposition: A | Discharge status: Home / Self Care | Payer: Self-pay | Source: Ambulatory Visit | Attending: Cardiovascular Disease | Admitting: Cardiovascular Disease

## 2013-10-14 ENCOUNTER — Encounter (HOSPITAL_COMMUNITY)
Admission: RE | Admit: 2013-10-14 | Discharge: 2013-10-14 | Disposition: A | Discharge status: Home / Self Care | Payer: Self-pay | Source: Ambulatory Visit | Attending: Cardiovascular Disease | Admitting: Cardiovascular Disease

## 2013-10-18 ENCOUNTER — Encounter (HOSPITAL_COMMUNITY)
Admission: RE | Admit: 2013-10-18 | Discharge: 2013-10-18 | Disposition: A | Discharge status: Home / Self Care | Payer: Self-pay | Source: Ambulatory Visit | Attending: Cardiovascular Disease | Admitting: Cardiovascular Disease

## 2013-10-20 ENCOUNTER — Encounter (HOSPITAL_COMMUNITY)
Admission: RE | Admit: 2013-10-20 | Discharge: 2013-10-20 | Disposition: A | Discharge status: Home / Self Care | Payer: Self-pay | Source: Ambulatory Visit | Attending: Cardiovascular Disease | Admitting: Cardiovascular Disease

## 2013-10-20 DIAGNOSIS — I10 Essential (primary) hypertension: Secondary | ICD-10-CM | POA: Insufficient documentation

## 2013-10-20 DIAGNOSIS — I252 Old myocardial infarction: Secondary | ICD-10-CM | POA: Insufficient documentation

## 2013-10-20 DIAGNOSIS — I5181 Takotsubo syndrome: Secondary | ICD-10-CM | POA: Insufficient documentation

## 2013-10-20 DIAGNOSIS — Z5189 Encounter for other specified aftercare: Secondary | ICD-10-CM | POA: Insufficient documentation

## 2013-10-20 DIAGNOSIS — Z7901 Long term (current) use of anticoagulants: Secondary | ICD-10-CM | POA: Insufficient documentation

## 2013-10-20 DIAGNOSIS — E785 Hyperlipidemia, unspecified: Secondary | ICD-10-CM | POA: Insufficient documentation

## 2013-10-20 DIAGNOSIS — I059 Rheumatic mitral valve disease, unspecified: Secondary | ICD-10-CM | POA: Insufficient documentation

## 2013-10-21 ENCOUNTER — Encounter (HOSPITAL_COMMUNITY)
Admission: RE | Admit: 2013-10-21 | Discharge: 2013-10-21 | Disposition: A | Discharge status: Home / Self Care | Payer: Self-pay | Source: Ambulatory Visit | Attending: Cardiovascular Disease | Admitting: Cardiovascular Disease

## 2013-10-25 ENCOUNTER — Encounter (HOSPITAL_COMMUNITY)
Admission: RE | Admit: 2013-10-25 | Discharge: 2013-10-25 | Disposition: A | Discharge status: Home / Self Care | Payer: Self-pay | Source: Ambulatory Visit | Attending: Cardiovascular Disease | Admitting: Cardiovascular Disease

## 2013-10-26 ENCOUNTER — Encounter: Payer: Self-pay | Admitting: Neurology

## 2013-10-27 ENCOUNTER — Encounter (HOSPITAL_COMMUNITY)
Admission: RE | Admit: 2013-10-27 | Discharge: 2013-10-27 | Disposition: A | Discharge status: Home / Self Care | Payer: Self-pay | Source: Ambulatory Visit | Attending: Cardiovascular Disease | Admitting: Cardiovascular Disease

## 2013-10-28 ENCOUNTER — Encounter (HOSPITAL_COMMUNITY)
Admission: RE | Admit: 2013-10-28 | Discharge: 2013-10-28 | Disposition: A | Discharge status: Home / Self Care | Payer: Self-pay | Source: Ambulatory Visit | Attending: Cardiovascular Disease | Admitting: Cardiovascular Disease

## 2013-11-01 ENCOUNTER — Encounter (HOSPITAL_COMMUNITY)
Admission: RE | Admit: 2013-11-01 | Discharge: 2013-11-01 | Disposition: A | Discharge status: Home / Self Care | Payer: Self-pay | Source: Ambulatory Visit | Attending: Cardiovascular Disease | Admitting: Cardiovascular Disease

## 2013-11-03 ENCOUNTER — Encounter (HOSPITAL_COMMUNITY)
Admission: RE | Admit: 2013-11-03 | Discharge: 2013-11-03 | Disposition: A | Discharge status: Home / Self Care | Payer: Self-pay | Source: Ambulatory Visit | Attending: Cardiovascular Disease | Admitting: Cardiovascular Disease

## 2013-11-04 ENCOUNTER — Encounter (HOSPITAL_COMMUNITY)
Admission: RE | Admit: 2013-11-04 | Discharge: 2013-11-04 | Disposition: A | Discharge status: Home / Self Care | Payer: Self-pay | Source: Ambulatory Visit | Attending: Cardiovascular Disease | Admitting: Cardiovascular Disease

## 2013-11-08 ENCOUNTER — Encounter (HOSPITAL_COMMUNITY)
Admission: RE | Admit: 2013-11-08 | Discharge: 2013-11-08 | Disposition: A | Discharge status: Home / Self Care | Payer: Self-pay | Source: Ambulatory Visit | Attending: Cardiovascular Disease | Admitting: Cardiovascular Disease

## 2013-11-10 ENCOUNTER — Encounter (HOSPITAL_COMMUNITY)
Admission: RE | Admit: 2013-11-10 | Discharge: 2013-11-10 | Disposition: A | Discharge status: Home / Self Care | Payer: Self-pay | Source: Ambulatory Visit | Attending: Cardiovascular Disease | Admitting: Cardiovascular Disease

## 2013-11-11 ENCOUNTER — Encounter (HOSPITAL_COMMUNITY)
Admission: RE | Admit: 2013-11-11 | Discharge: 2013-11-11 | Disposition: A | Discharge status: Home / Self Care | Payer: Self-pay | Source: Ambulatory Visit | Attending: Cardiovascular Disease | Admitting: Cardiovascular Disease

## 2013-11-15 ENCOUNTER — Encounter (HOSPITAL_COMMUNITY): Payer: Medicare Other

## 2013-11-15 DIAGNOSIS — K5010 Crohn's disease of large intestine without complications: Secondary | ICD-10-CM | POA: Insufficient documentation

## 2013-11-17 ENCOUNTER — Encounter (HOSPITAL_COMMUNITY)
Admission: RE | Admit: 2013-11-17 | Discharge: 2013-11-17 | Disposition: A | Discharge status: Home / Self Care | Payer: Self-pay | Source: Ambulatory Visit | Attending: Cardiovascular Disease | Admitting: Cardiovascular Disease

## 2013-11-18 ENCOUNTER — Encounter (HOSPITAL_COMMUNITY): Payer: Medicare Other

## 2013-11-18 DIAGNOSIS — Z5189 Encounter for other specified aftercare: Secondary | ICD-10-CM | POA: Insufficient documentation

## 2013-11-18 DIAGNOSIS — I252 Old myocardial infarction: Secondary | ICD-10-CM | POA: Insufficient documentation

## 2013-11-18 DIAGNOSIS — E785 Hyperlipidemia, unspecified: Secondary | ICD-10-CM | POA: Insufficient documentation

## 2013-11-18 DIAGNOSIS — I10 Essential (primary) hypertension: Secondary | ICD-10-CM | POA: Insufficient documentation

## 2013-11-18 DIAGNOSIS — I5181 Takotsubo syndrome: Secondary | ICD-10-CM | POA: Insufficient documentation

## 2013-11-18 DIAGNOSIS — Z7901 Long term (current) use of anticoagulants: Secondary | ICD-10-CM | POA: Insufficient documentation

## 2013-11-18 DIAGNOSIS — I059 Rheumatic mitral valve disease, unspecified: Secondary | ICD-10-CM | POA: Insufficient documentation

## 2013-11-22 ENCOUNTER — Encounter (HOSPITAL_COMMUNITY)
Admission: RE | Admit: 2013-11-22 | Discharge: 2013-11-22 | Disposition: A | Discharge status: Home / Self Care | Payer: Self-pay | Source: Ambulatory Visit | Attending: Cardiovascular Disease | Admitting: Cardiovascular Disease

## 2013-11-24 ENCOUNTER — Encounter (HOSPITAL_COMMUNITY)
Admission: RE | Admit: 2013-11-24 | Discharge: 2013-11-24 | Disposition: A | Discharge status: Home / Self Care | Payer: Self-pay | Source: Ambulatory Visit | Attending: Cardiovascular Disease | Admitting: Cardiovascular Disease

## 2013-11-25 ENCOUNTER — Encounter (HOSPITAL_COMMUNITY)
Admission: RE | Admit: 2013-11-25 | Discharge: 2013-11-25 | Disposition: A | Discharge status: Home / Self Care | Payer: Self-pay | Source: Ambulatory Visit | Attending: Cardiovascular Disease | Admitting: Cardiovascular Disease

## 2013-11-29 ENCOUNTER — Encounter (HOSPITAL_COMMUNITY)
Admission: RE | Admit: 2013-11-29 | Discharge: 2013-11-29 | Disposition: A | Discharge status: Home / Self Care | Payer: Self-pay | Source: Ambulatory Visit | Attending: Cardiovascular Disease | Admitting: Cardiovascular Disease

## 2013-12-01 ENCOUNTER — Encounter (HOSPITAL_COMMUNITY)
Admission: RE | Admit: 2013-12-01 | Discharge: 2013-12-01 | Disposition: A | Discharge status: Home / Self Care | Payer: Self-pay | Source: Ambulatory Visit | Attending: Cardiovascular Disease | Admitting: Cardiovascular Disease

## 2013-12-02 ENCOUNTER — Encounter (HOSPITAL_COMMUNITY)
Admission: RE | Admit: 2013-12-02 | Discharge: 2013-12-02 | Disposition: A | Discharge status: Home / Self Care | Payer: Self-pay | Source: Ambulatory Visit | Attending: Cardiovascular Disease | Admitting: Cardiovascular Disease

## 2013-12-06 ENCOUNTER — Encounter (HOSPITAL_COMMUNITY): Payer: Medicare Other

## 2013-12-08 ENCOUNTER — Encounter (HOSPITAL_COMMUNITY)
Admission: RE | Admit: 2013-12-08 | Discharge: 2013-12-08 | Disposition: A | Discharge status: Home / Self Care | Payer: Self-pay | Source: Ambulatory Visit | Attending: Cardiovascular Disease | Admitting: Cardiovascular Disease

## 2013-12-09 ENCOUNTER — Encounter (HOSPITAL_COMMUNITY)
Admission: RE | Admit: 2013-12-09 | Discharge: 2013-12-09 | Disposition: A | Discharge status: Home / Self Care | Payer: Self-pay | Source: Ambulatory Visit | Attending: Cardiovascular Disease | Admitting: Cardiovascular Disease

## 2013-12-13 ENCOUNTER — Encounter (HOSPITAL_COMMUNITY): Payer: Medicare Other

## 2013-12-15 ENCOUNTER — Encounter (HOSPITAL_COMMUNITY): Payer: Medicare Other

## 2013-12-16 ENCOUNTER — Encounter (HOSPITAL_COMMUNITY): Payer: Medicare Other

## 2013-12-20 ENCOUNTER — Encounter (HOSPITAL_COMMUNITY)
Admission: RE | Admit: 2013-12-20 | Discharge: 2013-12-20 | Disposition: A | Discharge status: Home / Self Care | Payer: Self-pay | Source: Ambulatory Visit | Attending: Cardiovascular Disease | Admitting: Cardiovascular Disease

## 2013-12-20 DIAGNOSIS — Z7901 Long term (current) use of anticoagulants: Secondary | ICD-10-CM | POA: Insufficient documentation

## 2013-12-20 DIAGNOSIS — I5181 Takotsubo syndrome: Secondary | ICD-10-CM | POA: Insufficient documentation

## 2013-12-20 DIAGNOSIS — I10 Essential (primary) hypertension: Secondary | ICD-10-CM | POA: Insufficient documentation

## 2013-12-20 DIAGNOSIS — I252 Old myocardial infarction: Secondary | ICD-10-CM | POA: Insufficient documentation

## 2013-12-20 DIAGNOSIS — E785 Hyperlipidemia, unspecified: Secondary | ICD-10-CM | POA: Insufficient documentation

## 2013-12-20 DIAGNOSIS — I059 Rheumatic mitral valve disease, unspecified: Secondary | ICD-10-CM | POA: Insufficient documentation

## 2013-12-20 DIAGNOSIS — Z5189 Encounter for other specified aftercare: Secondary | ICD-10-CM | POA: Insufficient documentation

## 2013-12-22 ENCOUNTER — Encounter (HOSPITAL_COMMUNITY)
Admission: RE | Admit: 2013-12-22 | Discharge: 2013-12-22 | Disposition: A | Discharge status: Home / Self Care | Payer: Self-pay | Source: Ambulatory Visit | Attending: Cardiovascular Disease | Admitting: Cardiovascular Disease

## 2013-12-23 ENCOUNTER — Encounter (HOSPITAL_COMMUNITY)
Admission: RE | Admit: 2013-12-23 | Discharge: 2013-12-23 | Disposition: A | Discharge status: Home / Self Care | Payer: Self-pay | Source: Ambulatory Visit | Attending: Cardiovascular Disease | Admitting: Cardiovascular Disease

## 2013-12-27 ENCOUNTER — Encounter (HOSPITAL_COMMUNITY)
Admission: RE | Admit: 2013-12-27 | Discharge: 2013-12-27 | Disposition: A | Discharge status: Home / Self Care | Payer: Self-pay | Source: Ambulatory Visit | Attending: Cardiovascular Disease | Admitting: Cardiovascular Disease

## 2013-12-29 ENCOUNTER — Encounter (HOSPITAL_COMMUNITY)
Admission: RE | Admit: 2013-12-29 | Discharge: 2013-12-29 | Disposition: A | Discharge status: Home / Self Care | Payer: Self-pay | Source: Ambulatory Visit | Attending: Cardiovascular Disease | Admitting: Cardiovascular Disease

## 2013-12-30 ENCOUNTER — Encounter (HOSPITAL_COMMUNITY)
Admission: RE | Admit: 2013-12-30 | Discharge: 2013-12-30 | Disposition: A | Discharge status: Home / Self Care | Payer: Self-pay | Source: Ambulatory Visit | Attending: Cardiovascular Disease | Admitting: Cardiovascular Disease

## 2014-01-03 ENCOUNTER — Encounter (HOSPITAL_COMMUNITY)
Admission: RE | Admit: 2014-01-03 | Discharge: 2014-01-03 | Disposition: A | Discharge status: Home / Self Care | Payer: Self-pay | Source: Ambulatory Visit | Attending: Cardiovascular Disease | Admitting: Cardiovascular Disease

## 2014-01-05 ENCOUNTER — Encounter (HOSPITAL_COMMUNITY)
Admission: RE | Admit: 2014-01-05 | Discharge: 2014-01-05 | Disposition: A | Discharge status: Home / Self Care | Payer: Self-pay | Source: Ambulatory Visit | Attending: Cardiovascular Disease | Admitting: Cardiovascular Disease

## 2014-01-06 ENCOUNTER — Encounter (HOSPITAL_COMMUNITY)
Admission: RE | Admit: 2014-01-06 | Discharge: 2014-01-06 | Disposition: A | Discharge status: Home / Self Care | Payer: Self-pay | Source: Ambulatory Visit | Attending: Cardiovascular Disease | Admitting: Cardiovascular Disease

## 2014-01-06 ENCOUNTER — Other Ambulatory Visit: Payer: Self-pay | Admitting: Internal Medicine

## 2014-01-10 ENCOUNTER — Encounter (HOSPITAL_COMMUNITY)
Admission: RE | Admit: 2014-01-10 | Discharge: 2014-01-10 | Disposition: A | Discharge status: Home / Self Care | Payer: Self-pay | Source: Ambulatory Visit | Attending: Cardiovascular Disease | Admitting: Cardiovascular Disease

## 2014-01-12 ENCOUNTER — Encounter (HOSPITAL_COMMUNITY)
Admission: RE | Admit: 2014-01-12 | Discharge: 2014-01-12 | Disposition: A | Discharge status: Home / Self Care | Payer: Self-pay | Source: Ambulatory Visit | Attending: Cardiovascular Disease | Admitting: Cardiovascular Disease

## 2014-01-13 ENCOUNTER — Encounter (HOSPITAL_COMMUNITY)
Admission: RE | Admit: 2014-01-13 | Discharge: 2014-01-13 | Disposition: A | Discharge status: Home / Self Care | Payer: Self-pay | Source: Ambulatory Visit | Attending: Cardiovascular Disease | Admitting: Cardiovascular Disease

## 2014-01-16 ENCOUNTER — Encounter (HOSPITAL_COMMUNITY)
Admission: RE | Admit: 2014-01-16 | Discharge: 2014-01-16 | Disposition: A | Discharge status: Home / Self Care | Payer: Self-pay | Source: Ambulatory Visit | Attending: Cardiovascular Disease | Admitting: Cardiovascular Disease

## 2014-01-16 ENCOUNTER — Ambulatory Visit (INDEPENDENT_AMBULATORY_CARE_PROVIDER_SITE_OTHER): Payer: Medicare Other | Admitting: Neurology

## 2014-01-16 ENCOUNTER — Encounter: Payer: Self-pay | Admitting: Neurology

## 2014-01-16 VITALS — BP 106/64 | HR 64 | Resp 16 | Ht 67.5 in | Wt 119.0 lb

## 2014-01-16 DIAGNOSIS — G250 Essential tremor: Secondary | ICD-10-CM

## 2014-01-16 DIAGNOSIS — G252 Other specified forms of tremor: Principal | ICD-10-CM

## 2014-01-16 NOTE — Progress Notes (Signed)
Subjective:   Kirsten Rice was seen in consultation in the movement disorder clinic at the request of Kirsten Pao, MD.  The evaluation is for tremor.  The pt previously saw Dr. Erling Rice.  No previous notes are available from [place] to review.  The patient is a 68 y.o. right handed female with a history of tremor.  Tremor began several years ago, but worse over the last 2 years.  There is a family hx of tremor in her father and in her sister.  Her sister is on medication, ? primidone.    Affected by caffeine:  no Affected by alcohol:  no Affected by stress:  yes Affected by fatigue:  yes Spills soup if on spoon:  yes Spills glass of liquid if full:  yes Affects ADL's (tying shoes, brushing teeth, etc):  yes  Current/Previously tried tremor medications: no  Current medications that may exacerbate tremor:  Prednisone (pt feels like it makes tremor worse and is moody with the medication)  Pt states that about a month ago she woke up in the AM and saw a bright color out of the eye with a black center.  It lasted 1 min.  She has had more frequent  Episodes.  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  She states that the left side of the head feels different than the right but there is no pain or discrete sensory change.  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    07/08/13:  The pt is f/u today.  She has ET.  She was given klonopin as she only wanted a prn medication.  She never filled it as she read the potential side effects and said that she would rather shake than take it.   She is now off of the prednisone and has been off of it since mid November and was able to take Humira.  Going off the prednisone definitely decreased for tremor, but unfortunately going on Humira caused a resurgence of her migraines that she had years ago.  She states that after she takes the Humira, she will get a frontal headache that feels like her head will explode."  She has nausea and  vomiting.  This is like the migraines she used to get.  Independently, she also has some muscle spasm in the right side of the neck.  She has no shooting pain down the arm.  She has no electric-like pain at all.  An MRI of the brain was done since last visit.  There was moderate small vessel disease and atrophy.  The patient and I discussed this today.  10/10/13:  Pt is f/u today regarding ET.  She is not on any medications for this.  I did start her on zanaflex for migraine prophylaxis with humira as well as for her neck pain.  She states that the zanaflex helped but she ended up going off of it.  Overall, she is better in that regard.  She states that she had flu-like sx's for 6 weeks and ended up going off of the humira on 08/25/13.  Her headaches improved but she is still having them 1-2 days per week.  They are not as bothersome and generally go away with 1 small cup of coffee.  She has noted shaking again, and prednisone was increased when she was sick and she is still on 25 mg of prednisone.  She states that "I think about all the side effects of  medication but I wish my shaking was better."  01/16/14 update:  The patient is following up regarding essential tremor.  Last visit, I gave her a prescription for primidone.  The patient admits that she never really started the medication.  She is worried about potential side effects as she had first dose effect in the past.  She did go to [facility] for a 2nd opinion re: crohns.  She was taken off of prednisone and had a colonoscopy that just showed crohns colitis.  She increased her fiber and probiotics and started colazol.  She didn't want to start the primidone as she just didn't want to confuse the picture while starting new other medications.    Outside reports reviewed: historical medical records.  Allergies  Allergen Reactions  . Cafergot [Ergotamine-Caffeine] Other (See Comments)    Vagal paralysis  . Compazine [Prochlorperazine Edisylate] Other (See  Comments)    paralysis of face  . Prochlorperazine Edisylate Other (See Comments)    REACTION: extrapyrimadal syndrome  . Tetanus-Diphtheria Toxoids Td Swelling    REACTION: arm swells   Meds:  reviewed  Past Medical History  Diagnosis Date  . Takotsubo cardiomyopathy 2007  . Raynaud's disease   . Mitral valve prolapse   . Crohn's disease   . Deep vein thrombosis     pt denies: 04/08/13  . Telangiectasia   . Hyperthyroidism     pt denies:  04/08/13  . GERD (gastroesophageal reflux disease)   . Myocardial infarction   . Migraines     occular    Past Surgical History  Procedure Laterality Date  . Shoulder surgery  1996    right  . Tubal ligation  1973  . Appendectomy  1971  . Colonoscopy    . Colonoscopy  05/11/2012    Procedure: COLONOSCOPY;  Surgeon: Lafayette Dragon, MD;  Location: [facility];  Service: Endoscopy;  Laterality: N/A;    History   Social History  . Marital Status: Married    Spouse Name: N/A    Number of Children: N/A  . Years of Education: N/A   Occupational History  . Retired     Marine scientist   Social History Main Topics  . Smoking status: Never Smoker   . Smokeless tobacco: Never Used  . Alcohol Use: 4.2 oz/week    7 Glasses of wine per week     Comment: glass of wine daily   . Drug Use: No  . Sexual Activity: Not on file   Other Topics Concern  . Not on file   Social History Narrative  . No narrative on file    Family Status  Relation Status Death Age  . Father Deceased     Aortic stenosis  . Sister Alive     4, hyperthyroidism, cataracts, DM, tremor  . Mother Deceased     COPD, tob abuse  . Child Alive     bipolar disease    Review of Systems A complete 10 system ROS was obtained and was negative apart from what is mentioned.   Objective:   VITALS:   Filed Vitals:   01/16/14 1002  BP: 106/64  Pulse: 64  Resp: 16  Height: 5' 7.5" (1.715 m)  Weight: 119 lb (53.978 kg)   Gen:  Appears stated age and in NAD. HEENT:   Normocephalic, atraumatic. The mucous membranes are moist. The superficial temporal arteries are without ropiness or tenderness.  NEUROLOGICAL:  Orientation:  The patient is alert and oriented x 3.  Recent and remote memory are intact.  Attention span and concentration are normal.  Able to name objects and repeat without trouble.  Fund of knowledge is appropriate Cranial nerves: There is good facial symmetry.  Speech is fluent and clear. Soft palate rises symmetrically and there is no tongue deviation. Hearing is intact to conversational tone. Tone: Tone is good throughout. Sensation: Sensation is intact to light touch throughout. Coordination:  The patient has no dysdiadichokinesia or dysmetria. Motor: Strength is at least antigravity x 4.   Gait and Station: The patient is able to ambulate without difficulty.   MOVEMENT EXAM: Tremor:  There is tremor in the bilateral upper extremities, most notable with postural changes, and increases with intention.  The right is worse than the left.  Some tremor noted with ambulation as well.    Labs: She has work from her primary care physician on 03/11/2013.  Her TSH was 0.7 and free T4 was normal at 1.3.     Assessment/Plan:   1.  Essential Tremor.  -This is evidenced by the symmetrical nature, family history and longstanding hx of gradually getting worse.  This has been worsened by prednisone.  -The pt would like to hold on starting the primidone since she is working with meds for her crohns now.    -Talked about topamax since can help tremor and migraine but she states that headaches are not so bad now that off of humira. 2.  neck pain that sounds like muscle spasm.  -resolved with zanaflex and is now off of the medication 3.  Migraine with humira  -Off of humira and doing better.  4.  The patient is to f/u prn

## 2014-01-17 ENCOUNTER — Encounter (HOSPITAL_COMMUNITY)
Admission: RE | Admit: 2014-01-17 | Discharge: 2014-01-17 | Disposition: A | Discharge status: Home / Self Care | Payer: Self-pay | Source: Ambulatory Visit | Attending: Cardiovascular Disease | Admitting: Cardiovascular Disease

## 2014-01-18 DIAGNOSIS — R300 Dysuria: Secondary | ICD-10-CM | POA: Insufficient documentation

## 2014-01-19 ENCOUNTER — Encounter (HOSPITAL_COMMUNITY)
Admission: RE | Admit: 2014-01-19 | Discharge: 2014-01-19 | Disposition: A | Discharge status: Home / Self Care | Payer: Self-pay | Source: Ambulatory Visit | Attending: Cardiovascular Disease | Admitting: Cardiovascular Disease

## 2014-01-19 DIAGNOSIS — Z7901 Long term (current) use of anticoagulants: Secondary | ICD-10-CM | POA: Insufficient documentation

## 2014-01-19 DIAGNOSIS — I5181 Takotsubo syndrome: Secondary | ICD-10-CM | POA: Insufficient documentation

## 2014-01-19 DIAGNOSIS — I10 Essential (primary) hypertension: Secondary | ICD-10-CM | POA: Insufficient documentation

## 2014-01-19 DIAGNOSIS — Z5189 Encounter for other specified aftercare: Secondary | ICD-10-CM | POA: Insufficient documentation

## 2014-01-19 DIAGNOSIS — I059 Rheumatic mitral valve disease, unspecified: Secondary | ICD-10-CM | POA: Insufficient documentation

## 2014-01-19 DIAGNOSIS — E785 Hyperlipidemia, unspecified: Secondary | ICD-10-CM | POA: Insufficient documentation

## 2014-01-19 DIAGNOSIS — I252 Old myocardial infarction: Secondary | ICD-10-CM | POA: Insufficient documentation

## 2014-01-20 ENCOUNTER — Encounter (HOSPITAL_COMMUNITY): Payer: Medicare Other

## 2014-01-24 ENCOUNTER — Encounter (HOSPITAL_COMMUNITY)
Admission: RE | Admit: 2014-01-24 | Discharge: 2014-01-24 | Disposition: A | Discharge status: Home / Self Care | Payer: Self-pay | Source: Ambulatory Visit | Attending: Cardiovascular Disease | Admitting: Cardiovascular Disease

## 2014-01-26 ENCOUNTER — Encounter (HOSPITAL_COMMUNITY)
Admission: RE | Admit: 2014-01-26 | Discharge: 2014-01-26 | Disposition: A | Discharge status: Home / Self Care | Payer: Self-pay | Source: Ambulatory Visit | Attending: Cardiovascular Disease | Admitting: Cardiovascular Disease

## 2014-01-27 ENCOUNTER — Encounter (HOSPITAL_COMMUNITY)
Admission: RE | Admit: 2014-01-27 | Discharge: 2014-01-27 | Disposition: A | Discharge status: Home / Self Care | Payer: Self-pay | Source: Ambulatory Visit | Attending: Cardiovascular Disease | Admitting: Cardiovascular Disease

## 2014-01-31 ENCOUNTER — Encounter (HOSPITAL_COMMUNITY): Payer: Medicare Other

## 2014-02-02 ENCOUNTER — Encounter (HOSPITAL_COMMUNITY)
Admission: RE | Admit: 2014-02-02 | Discharge: 2014-02-02 | Disposition: A | Discharge status: Home / Self Care | Payer: Self-pay | Source: Ambulatory Visit | Attending: Cardiovascular Disease | Admitting: Cardiovascular Disease

## 2014-02-03 ENCOUNTER — Encounter (HOSPITAL_COMMUNITY)
Admission: RE | Admit: 2014-02-03 | Discharge: 2014-02-03 | Disposition: A | Discharge status: Home / Self Care | Payer: Self-pay | Source: Ambulatory Visit | Attending: Cardiovascular Disease | Admitting: Cardiovascular Disease

## 2014-02-07 ENCOUNTER — Ambulatory Visit (INDEPENDENT_AMBULATORY_CARE_PROVIDER_SITE_OTHER): Payer: Medicare Other | Admitting: Internal Medicine

## 2014-02-07 ENCOUNTER — Encounter (HOSPITAL_COMMUNITY)
Admission: RE | Admit: 2014-02-07 | Discharge: 2014-02-07 | Disposition: A | Discharge status: Home / Self Care | Payer: Self-pay | Source: Ambulatory Visit | Attending: Cardiovascular Disease | Admitting: Cardiovascular Disease

## 2014-02-07 DIAGNOSIS — K5100 Ulcerative (chronic) pancolitis without complications: Secondary | ICD-10-CM

## 2014-02-09 ENCOUNTER — Encounter (HOSPITAL_COMMUNITY)
Admission: RE | Admit: 2014-02-09 | Discharge: 2014-02-09 | Disposition: A | Discharge status: Home / Self Care | Payer: Self-pay | Source: Ambulatory Visit | Attending: Cardiovascular Disease | Admitting: Cardiovascular Disease

## 2014-02-09 LAB — TB SKIN TEST
INDURATION: 0 mm
TB SKIN TEST: NEGATIVE

## 2014-02-10 ENCOUNTER — Encounter (HOSPITAL_COMMUNITY)
Admission: RE | Admit: 2014-02-10 | Discharge: 2014-02-10 | Disposition: A | Discharge status: Home / Self Care | Payer: Self-pay | Source: Ambulatory Visit | Attending: Cardiovascular Disease | Admitting: Cardiovascular Disease

## 2014-02-14 ENCOUNTER — Encounter (HOSPITAL_COMMUNITY)
Admission: RE | Admit: 2014-02-14 | Discharge: 2014-02-14 | Disposition: A | Discharge status: Home / Self Care | Payer: Self-pay | Source: Ambulatory Visit | Attending: Cardiovascular Disease | Admitting: Cardiovascular Disease

## 2014-02-16 ENCOUNTER — Encounter (HOSPITAL_COMMUNITY)
Admission: RE | Admit: 2014-02-16 | Discharge: 2014-02-16 | Disposition: A | Discharge status: Home / Self Care | Payer: Self-pay | Source: Ambulatory Visit | Attending: Cardiovascular Disease | Admitting: Cardiovascular Disease

## 2014-02-17 ENCOUNTER — Encounter (HOSPITAL_COMMUNITY)
Admission: RE | Admit: 2014-02-17 | Discharge: 2014-02-17 | Disposition: A | Discharge status: Home / Self Care | Payer: Self-pay | Source: Ambulatory Visit | Attending: Cardiovascular Disease | Admitting: Cardiovascular Disease

## 2014-02-21 ENCOUNTER — Encounter (HOSPITAL_COMMUNITY)
Admission: RE | Admit: 2014-02-21 | Discharge: 2014-02-21 | Disposition: A | Discharge status: Home / Self Care | Payer: Self-pay | Source: Ambulatory Visit | Attending: Cardiovascular Disease | Admitting: Cardiovascular Disease

## 2014-02-21 DIAGNOSIS — Z5189 Encounter for other specified aftercare: Secondary | ICD-10-CM | POA: Insufficient documentation

## 2014-02-21 DIAGNOSIS — I428 Other cardiomyopathies: Secondary | ICD-10-CM | POA: Insufficient documentation

## 2014-02-21 DIAGNOSIS — I252 Old myocardial infarction: Secondary | ICD-10-CM | POA: Insufficient documentation

## 2014-02-21 DIAGNOSIS — I059 Rheumatic mitral valve disease, unspecified: Secondary | ICD-10-CM | POA: Insufficient documentation

## 2014-02-23 ENCOUNTER — Encounter (HOSPITAL_COMMUNITY)
Admission: RE | Admit: 2014-02-23 | Discharge: 2014-02-23 | Disposition: A | Discharge status: Home / Self Care | Payer: Self-pay | Source: Ambulatory Visit | Attending: Cardiovascular Disease | Admitting: Cardiovascular Disease

## 2014-02-24 ENCOUNTER — Encounter (HOSPITAL_COMMUNITY)
Admission: RE | Admit: 2014-02-24 | Discharge: 2014-02-24 | Disposition: A | Discharge status: Home / Self Care | Payer: Self-pay | Source: Ambulatory Visit | Attending: Cardiovascular Disease | Admitting: Cardiovascular Disease

## 2014-02-28 ENCOUNTER — Encounter (HOSPITAL_COMMUNITY)
Admission: RE | Admit: 2014-02-28 | Discharge: 2014-02-28 | Disposition: A | Discharge status: Home / Self Care | Payer: Self-pay | Source: Ambulatory Visit | Attending: Cardiovascular Disease | Admitting: Cardiovascular Disease

## 2014-03-02 ENCOUNTER — Encounter (HOSPITAL_COMMUNITY)
Admission: RE | Admit: 2014-03-02 | Discharge: 2014-03-02 | Disposition: A | Discharge status: Home / Self Care | Payer: Self-pay | Source: Ambulatory Visit | Attending: Cardiovascular Disease | Admitting: Cardiovascular Disease

## 2014-03-03 ENCOUNTER — Encounter (HOSPITAL_COMMUNITY)
Admission: RE | Admit: 2014-03-03 | Discharge: 2014-03-03 | Disposition: A | Discharge status: Home / Self Care | Payer: Self-pay | Source: Ambulatory Visit | Attending: Cardiovascular Disease | Admitting: Cardiovascular Disease

## 2014-03-06 ENCOUNTER — Other Ambulatory Visit: Payer: Self-pay | Admitting: Urology

## 2014-03-06 DIAGNOSIS — R109 Unspecified abdominal pain: Secondary | ICD-10-CM | POA: Insufficient documentation

## 2014-03-06 DIAGNOSIS — K50919 Crohn's disease, unspecified, with unspecified complications: Secondary | ICD-10-CM

## 2014-03-07 ENCOUNTER — Encounter (HOSPITAL_COMMUNITY)
Admission: RE | Admit: 2014-03-07 | Discharge: 2014-03-07 | Disposition: A | Discharge status: Home / Self Care | Payer: Self-pay | Source: Ambulatory Visit | Attending: Cardiovascular Disease | Admitting: Cardiovascular Disease

## 2014-03-09 ENCOUNTER — Encounter (HOSPITAL_COMMUNITY)
Admission: RE | Admit: 2014-03-09 | Discharge: 2014-03-09 | Disposition: A | Discharge status: Home / Self Care | Payer: Self-pay | Source: Ambulatory Visit | Attending: Cardiovascular Disease | Admitting: Cardiovascular Disease

## 2014-03-10 ENCOUNTER — Encounter (HOSPITAL_COMMUNITY)
Admission: RE | Admit: 2014-03-10 | Discharge: 2014-03-10 | Disposition: A | Discharge status: Home / Self Care | Payer: Self-pay | Source: Ambulatory Visit | Attending: Cardiovascular Disease | Admitting: Cardiovascular Disease

## 2014-03-13 ENCOUNTER — Ambulatory Visit
Admission: RE | Admit: 2014-03-13 | Discharge: 2014-03-13 | Disposition: A | Discharge status: Home / Self Care | Payer: Medicare Other | Source: Ambulatory Visit | Attending: Urology | Admitting: Urology

## 2014-03-13 DIAGNOSIS — R109 Unspecified abdominal pain: Secondary | ICD-10-CM

## 2014-03-13 DIAGNOSIS — K50919 Crohn's disease, unspecified, with unspecified complications: Secondary | ICD-10-CM

## 2014-03-14 ENCOUNTER — Encounter (HOSPITAL_COMMUNITY)
Admission: RE | Admit: 2014-03-14 | Discharge: 2014-03-14 | Disposition: A | Discharge status: Home / Self Care | Payer: Self-pay | Source: Ambulatory Visit | Attending: Cardiovascular Disease | Admitting: Cardiovascular Disease

## 2014-03-16 ENCOUNTER — Encounter (HOSPITAL_COMMUNITY)
Admission: RE | Admit: 2014-03-16 | Discharge: 2014-03-16 | Disposition: A | Discharge status: Home / Self Care | Payer: Self-pay | Source: Ambulatory Visit | Attending: Cardiovascular Disease | Admitting: Cardiovascular Disease

## 2014-03-17 ENCOUNTER — Encounter (HOSPITAL_COMMUNITY)
Admission: RE | Admit: 2014-03-17 | Discharge: 2014-03-17 | Disposition: A | Discharge status: Home / Self Care | Payer: Self-pay | Source: Ambulatory Visit | Attending: Cardiovascular Disease | Admitting: Cardiovascular Disease

## 2014-03-17 ENCOUNTER — Encounter: Payer: Self-pay | Admitting: Internal Medicine

## 2014-03-20 ENCOUNTER — Encounter (HOSPITAL_COMMUNITY)
Admission: RE | Admit: 2014-03-20 | Discharge: 2014-03-20 | Disposition: A | Discharge status: Home / Self Care | Payer: Self-pay | Source: Ambulatory Visit | Attending: Cardiovascular Disease | Admitting: Cardiovascular Disease

## 2014-03-21 ENCOUNTER — Encounter (HOSPITAL_COMMUNITY)
Admission: RE | Admit: 2014-03-21 | Discharge: 2014-03-21 | Disposition: A | Discharge status: Home / Self Care | Payer: Self-pay | Source: Ambulatory Visit | Attending: Cardiovascular Disease | Admitting: Cardiovascular Disease

## 2014-03-21 DIAGNOSIS — I059 Rheumatic mitral valve disease, unspecified: Secondary | ICD-10-CM | POA: Insufficient documentation

## 2014-03-21 DIAGNOSIS — Z5189 Encounter for other specified aftercare: Secondary | ICD-10-CM | POA: Insufficient documentation

## 2014-03-21 DIAGNOSIS — I428 Other cardiomyopathies: Secondary | ICD-10-CM | POA: Insufficient documentation

## 2014-03-21 DIAGNOSIS — I252 Old myocardial infarction: Secondary | ICD-10-CM | POA: Insufficient documentation

## 2014-03-23 ENCOUNTER — Encounter (HOSPITAL_COMMUNITY)
Admission: RE | Admit: 2014-03-23 | Discharge: 2014-03-23 | Disposition: A | Discharge status: Home / Self Care | Payer: Self-pay | Source: Ambulatory Visit | Attending: Cardiovascular Disease | Admitting: Cardiovascular Disease

## 2014-03-24 ENCOUNTER — Encounter (HOSPITAL_COMMUNITY): Payer: Self-pay

## 2014-03-28 ENCOUNTER — Encounter (HOSPITAL_COMMUNITY)
Admission: RE | Admit: 2014-03-28 | Discharge: 2014-03-28 | Disposition: A | Discharge status: Home / Self Care | Payer: Self-pay | Source: Ambulatory Visit | Attending: Cardiovascular Disease | Admitting: Cardiovascular Disease

## 2014-03-30 ENCOUNTER — Encounter (HOSPITAL_COMMUNITY)
Admission: RE | Admit: 2014-03-30 | Discharge: 2014-03-30 | Disposition: A | Discharge status: Home / Self Care | Payer: Self-pay | Source: Ambulatory Visit | Attending: Cardiovascular Disease | Admitting: Cardiovascular Disease

## 2014-03-31 ENCOUNTER — Encounter (HOSPITAL_COMMUNITY)
Admission: RE | Admit: 2014-03-31 | Discharge: 2014-03-31 | Disposition: A | Discharge status: Home / Self Care | Payer: Self-pay | Source: Ambulatory Visit | Attending: Cardiovascular Disease | Admitting: Cardiovascular Disease

## 2014-04-04 ENCOUNTER — Encounter (HOSPITAL_COMMUNITY)
Admission: RE | Admit: 2014-04-04 | Discharge: 2014-04-04 | Disposition: A | Discharge status: Home / Self Care | Payer: Self-pay | Source: Ambulatory Visit | Attending: Cardiovascular Disease | Admitting: Cardiovascular Disease

## 2014-04-06 ENCOUNTER — Encounter (HOSPITAL_COMMUNITY)
Admission: RE | Admit: 2014-04-06 | Discharge: 2014-04-06 | Disposition: A | Discharge status: Home / Self Care | Payer: Self-pay | Source: Ambulatory Visit | Attending: Cardiovascular Disease | Admitting: Cardiovascular Disease

## 2014-04-07 ENCOUNTER — Encounter (HOSPITAL_COMMUNITY)
Admission: RE | Admit: 2014-04-07 | Discharge: 2014-04-07 | Disposition: A | Discharge status: Home / Self Care | Payer: Medicare Other | Source: Ambulatory Visit | Attending: Cardiovascular Disease | Admitting: Cardiovascular Disease

## 2014-04-11 ENCOUNTER — Encounter (HOSPITAL_COMMUNITY)
Admission: RE | Admit: 2014-04-11 | Discharge: 2014-04-11 | Disposition: A | Discharge status: Home / Self Care | Payer: Self-pay | Source: Ambulatory Visit | Attending: Cardiovascular Disease | Admitting: Cardiovascular Disease

## 2014-04-13 ENCOUNTER — Encounter (HOSPITAL_COMMUNITY)
Admission: RE | Admit: 2014-04-13 | Discharge: 2014-04-13 | Disposition: A | Discharge status: Home / Self Care | Payer: Self-pay | Source: Ambulatory Visit | Attending: Cardiovascular Disease | Admitting: Cardiovascular Disease

## 2014-04-14 ENCOUNTER — Encounter (HOSPITAL_COMMUNITY): Payer: Self-pay

## 2014-04-18 ENCOUNTER — Encounter (HOSPITAL_COMMUNITY)
Admission: RE | Admit: 2014-04-18 | Discharge: 2014-04-18 | Disposition: A | Discharge status: Home / Self Care | Payer: Self-pay | Source: Ambulatory Visit | Attending: Cardiovascular Disease | Admitting: Cardiovascular Disease

## 2014-04-20 ENCOUNTER — Encounter (HOSPITAL_COMMUNITY)
Admission: RE | Admit: 2014-04-20 | Discharge: 2014-04-20 | Disposition: A | Discharge status: Home / Self Care | Payer: Self-pay | Source: Ambulatory Visit | Attending: Cardiovascular Disease | Admitting: Cardiovascular Disease

## 2014-04-20 DIAGNOSIS — I059 Rheumatic mitral valve disease, unspecified: Secondary | ICD-10-CM | POA: Insufficient documentation

## 2014-04-20 DIAGNOSIS — I252 Old myocardial infarction: Secondary | ICD-10-CM | POA: Insufficient documentation

## 2014-04-20 DIAGNOSIS — I429 Cardiomyopathy, unspecified: Secondary | ICD-10-CM | POA: Insufficient documentation

## 2014-04-21 ENCOUNTER — Encounter (HOSPITAL_COMMUNITY)
Admission: RE | Admit: 2014-04-21 | Discharge: 2014-04-21 | Disposition: A | Discharge status: Home / Self Care | Payer: Self-pay | Source: Ambulatory Visit | Attending: Cardiovascular Disease | Admitting: Cardiovascular Disease

## 2014-04-25 ENCOUNTER — Encounter (HOSPITAL_COMMUNITY)
Admission: RE | Admit: 2014-04-25 | Discharge: 2014-04-25 | Disposition: A | Discharge status: Home / Self Care | Payer: Self-pay | Source: Ambulatory Visit | Attending: Cardiovascular Disease | Admitting: Cardiovascular Disease

## 2014-04-27 ENCOUNTER — Encounter (HOSPITAL_COMMUNITY)
Admission: RE | Admit: 2014-04-27 | Discharge: 2014-04-27 | Disposition: A | Discharge status: Home / Self Care | Payer: Self-pay | Source: Ambulatory Visit | Attending: Cardiovascular Disease | Admitting: Cardiovascular Disease

## 2014-04-28 ENCOUNTER — Encounter (HOSPITAL_COMMUNITY): Payer: Self-pay

## 2014-05-02 ENCOUNTER — Encounter (HOSPITAL_COMMUNITY)
Admission: RE | Admit: 2014-05-02 | Discharge: 2014-05-02 | Disposition: A | Discharge status: Home / Self Care | Payer: Self-pay | Source: Ambulatory Visit | Attending: Cardiovascular Disease | Admitting: Cardiovascular Disease

## 2014-05-03 DIAGNOSIS — L720 Epidermal cyst: Secondary | ICD-10-CM | POA: Insufficient documentation

## 2014-05-04 ENCOUNTER — Encounter (HOSPITAL_COMMUNITY)
Admission: RE | Admit: 2014-05-04 | Discharge: 2014-05-04 | Disposition: A | Discharge status: Home / Self Care | Payer: Self-pay | Source: Ambulatory Visit | Attending: Cardiovascular Disease | Admitting: Cardiovascular Disease

## 2014-05-05 ENCOUNTER — Encounter (HOSPITAL_COMMUNITY): Payer: Self-pay

## 2014-05-05 ENCOUNTER — Other Ambulatory Visit: Payer: Self-pay

## 2014-05-09 ENCOUNTER — Encounter (HOSPITAL_COMMUNITY): Payer: Self-pay

## 2014-05-10 ENCOUNTER — Other Ambulatory Visit: Payer: Self-pay | Admitting: Obstetrics and Gynecology

## 2014-05-11 ENCOUNTER — Encounter (HOSPITAL_COMMUNITY)
Admission: RE | Admit: 2014-05-11 | Discharge: 2014-05-11 | Disposition: A | Discharge status: Home / Self Care | Payer: Self-pay | Source: Ambulatory Visit | Attending: Cardiovascular Disease | Admitting: Cardiovascular Disease

## 2014-05-12 ENCOUNTER — Encounter (HOSPITAL_COMMUNITY)
Admission: RE | Admit: 2014-05-12 | Discharge: 2014-05-12 | Disposition: A | Discharge status: Home / Self Care | Payer: Self-pay | Source: Ambulatory Visit | Attending: Cardiovascular Disease | Admitting: Cardiovascular Disease

## 2014-05-12 LAB — CYTOLOGY - PAP

## 2014-05-16 ENCOUNTER — Encounter (HOSPITAL_COMMUNITY)
Admission: RE | Admit: 2014-05-16 | Discharge: 2014-05-16 | Disposition: A | Discharge status: Home / Self Care | Payer: Self-pay | Source: Ambulatory Visit | Attending: Cardiovascular Disease | Admitting: Cardiovascular Disease

## 2014-05-18 ENCOUNTER — Encounter (HOSPITAL_COMMUNITY)
Admission: RE | Admit: 2014-05-18 | Discharge: 2014-05-18 | Disposition: A | Discharge status: Home / Self Care | Payer: Self-pay | Source: Ambulatory Visit | Attending: Cardiovascular Disease | Admitting: Cardiovascular Disease

## 2014-05-19 ENCOUNTER — Encounter (HOSPITAL_COMMUNITY)
Admission: RE | Admit: 2014-05-19 | Discharge: 2014-05-19 | Disposition: A | Discharge status: Home / Self Care | Payer: Self-pay | Source: Ambulatory Visit | Attending: Cardiovascular Disease | Admitting: Cardiovascular Disease

## 2014-05-23 ENCOUNTER — Encounter (HOSPITAL_COMMUNITY)
Admission: RE | Admit: 2014-05-23 | Discharge: 2014-05-23 | Disposition: A | Discharge status: Home / Self Care | Payer: Self-pay | Source: Ambulatory Visit | Attending: Cardiovascular Disease | Admitting: Cardiovascular Disease

## 2014-05-23 DIAGNOSIS — I059 Rheumatic mitral valve disease, unspecified: Secondary | ICD-10-CM | POA: Insufficient documentation

## 2014-05-23 DIAGNOSIS — I429 Cardiomyopathy, unspecified: Secondary | ICD-10-CM | POA: Insufficient documentation

## 2014-05-23 DIAGNOSIS — I252 Old myocardial infarction: Secondary | ICD-10-CM | POA: Insufficient documentation

## 2014-05-25 ENCOUNTER — Encounter (HOSPITAL_COMMUNITY): Payer: Self-pay

## 2014-05-26 ENCOUNTER — Encounter (HOSPITAL_COMMUNITY)
Admission: RE | Admit: 2014-05-26 | Discharge: 2014-05-26 | Disposition: A | Discharge status: Home / Self Care | Payer: Self-pay | Source: Ambulatory Visit | Attending: Cardiovascular Disease | Admitting: Cardiovascular Disease

## 2014-05-30 ENCOUNTER — Encounter (HOSPITAL_COMMUNITY)
Admission: RE | Admit: 2014-05-30 | Discharge: 2014-05-30 | Disposition: A | Discharge status: Home / Self Care | Payer: Self-pay | Source: Ambulatory Visit | Attending: Cardiovascular Disease | Admitting: Cardiovascular Disease

## 2014-06-01 ENCOUNTER — Encounter (HOSPITAL_COMMUNITY)
Admission: RE | Admit: 2014-06-01 | Discharge: 2014-06-01 | Disposition: A | Discharge status: Home / Self Care | Payer: Self-pay | Source: Ambulatory Visit | Attending: Cardiovascular Disease | Admitting: Cardiovascular Disease

## 2014-06-02 ENCOUNTER — Encounter (HOSPITAL_COMMUNITY)
Admission: RE | Admit: 2014-06-02 | Discharge: 2014-06-02 | Disposition: A | Discharge status: Home / Self Care | Payer: Self-pay | Source: Ambulatory Visit | Attending: Cardiovascular Disease | Admitting: Cardiovascular Disease

## 2014-06-05 ENCOUNTER — Other Ambulatory Visit: Payer: Self-pay | Admitting: Urology

## 2014-06-05 ENCOUNTER — Other Ambulatory Visit: Payer: Self-pay | Admitting: Obstetrics and Gynecology

## 2014-06-05 DIAGNOSIS — N281 Cyst of kidney, acquired: Secondary | ICD-10-CM | POA: Insufficient documentation

## 2014-06-05 DIAGNOSIS — R918 Other nonspecific abnormal finding of lung field: Secondary | ICD-10-CM | POA: Insufficient documentation

## 2014-06-05 DIAGNOSIS — R911 Solitary pulmonary nodule: Secondary | ICD-10-CM

## 2014-06-06 ENCOUNTER — Encounter (HOSPITAL_COMMUNITY)
Admission: RE | Admit: 2014-06-06 | Discharge: 2014-06-06 | Disposition: A | Discharge status: Home / Self Care | Payer: Self-pay | Source: Ambulatory Visit | Attending: Cardiovascular Disease | Admitting: Cardiovascular Disease

## 2014-06-06 LAB — CYTOLOGY - PAP

## 2014-06-08 ENCOUNTER — Encounter (HOSPITAL_COMMUNITY)
Admission: RE | Admit: 2014-06-08 | Discharge: 2014-06-08 | Disposition: A | Discharge status: Home / Self Care | Payer: Self-pay | Source: Ambulatory Visit | Attending: Cardiovascular Disease | Admitting: Cardiovascular Disease

## 2014-06-09 ENCOUNTER — Encounter (HOSPITAL_COMMUNITY)
Admission: RE | Admit: 2014-06-09 | Discharge: 2014-06-09 | Disposition: A | Discharge status: Home / Self Care | Payer: Self-pay | Source: Ambulatory Visit | Attending: Cardiovascular Disease | Admitting: Cardiovascular Disease

## 2014-06-09 ENCOUNTER — Ambulatory Visit: Payer: Medicare Other | Admitting: Cardiology

## 2014-06-09 ENCOUNTER — Encounter: Payer: Self-pay | Admitting: Cardiovascular Disease

## 2014-06-09 ENCOUNTER — Ambulatory Visit (INDEPENDENT_AMBULATORY_CARE_PROVIDER_SITE_OTHER): Payer: Medicare Other | Admitting: Cardiovascular Disease

## 2014-06-09 VITALS — BP 132/80 | HR 66 | Ht 67.5 in | Wt 114.1 lb

## 2014-06-09 DIAGNOSIS — I059 Rheumatic mitral valve disease, unspecified: Secondary | ICD-10-CM

## 2014-06-09 MED ORDER — FUROSEMIDE 20 MG PO TABS: 20.0000 mg | ORAL_TABLET | Freq: Every day | ORAL | Status: DC

## 2014-06-09 MED ORDER — BENAZEPRIL HCL 10 MG PO TABS: 5.0000 mg | ORAL_TABLET | Freq: Every day | ORAL | Status: DC

## 2014-06-09 MED ORDER — POTASSIUM CHLORIDE CRYS ER 20 MEQ PO TBCR: 20.0000 meq | EXTENDED_RELEASE_TABLET | Freq: Every day | ORAL | Status: DC

## 2014-06-09 NOTE — Patient Instructions (Signed)
Your physician wants you to follow-up in: 1 YEAR with Dr Cooper.  You will receive a reminder letter in the mail two months in advance. If you don't receive a letter, please call our office to schedule the follow-up appointment.  Your physician recommends that you continue on your current medications as directed. Please refer to the Current Medication list given to you today.  

## 2014-06-09 NOTE — Progress Notes (Signed)
Background: The patient is followed for hypertension and history of Takotsubo's cardiomyopathy which occurred in 2007. Her left ventricular function normalized following the event. Cardiac catheterization demonstrated no obstructive CAD.  HPI:  68 year old woman presenting for follow-up evaluation. The patient has had a difficult year. She's had lots of problems with Crohn's disease. She has had increasing weakness, weight loss, and does complain of shortness of breath with activity. She continues to participate in the maintenance phase of cardiac rehabilitation and has some shortness of breath with talking while she exercises. She denies orthopnea, PND, leg swelling, or abdominal swelling. No lightheadedness or presyncope.  Studies:  2D Echo 06/2013: Study Conclusions  - Left ventricle: The cavity size was normal. Wall thickness was normal. Systolic function was normal. The estimated ejection fraction was in the range of 60% to 65%. Wall motion was normal; there were no regional wall motion abnormalities. - Mitral valve: Mild regurgitation.  Outpatient Encounter Prescriptions as of 06/09/2014  Medication Sig  . aspirin 81 MG tablet Take 81 mg by mouth daily.    . balsalazide (COLAZAL) 750 MG capsule Take 2,250 mg by mouth 3 (three) times daily.   . benazepril (LOTENSIN) 10 MG tablet Take 5 mg by mouth daily.  . Biotin 2500 MCG CAPS Take 2,500 mcg by mouth daily.   . Calcium-Vitamin D 600-200 MG-UNIT per tablet Take 2 tablets by mouth daily.  . furosemide (LASIX) 20 MG tablet Take 1 tablet (20 mg total) by mouth daily.  . potassium chloride SA (K-DUR,KLOR-CON) 20 MEQ tablet Take 20 mEq by mouth daily.  . vitamin C (ASCORBIC ACID) 500 MG tablet Take 500 mg by mouth daily.      Allergies  Allergen Reactions  . Cafergot [Ergotamine-Caffeine] Other (See Comments)    Vagal paralysis  . Compazine [Prochlorperazine Edisylate] Other (See Comments)    paralysis of face  .  Prochlorperazine Edisylate Other (See Comments)    REACTION: extrapyrimadal syndrome  . Tetanus-Diphtheria Toxoids Td Swelling    REACTION: arm swells    Past Medical History  Diagnosis Date  . Takotsubo cardiomyopathy 2007  . Raynaud's disease   . Mitral valve prolapse   . Crohn's disease   . Deep vein thrombosis     pt denies: 04/08/13  . Telangiectasia   . Hyperthyroidism     pt denies:  04/08/13  . GERD (gastroesophageal reflux disease)   . Myocardial infarction   . Migraines     occular    family history includes Aortic stenosis in her father; Diabetes in her sister and sister; Hypertension in her father and mother; Hyperthyroidism in her sister, sister, sister, and sister. There is no history of Colon cancer.   ROS: Positive for shortness of breath, night sweats, abdominal pain and diarrhea. Otherwise negative except as per HPI  BP 132/80 mmHg  Pulse 66  Ht 5' 7.5" (1.715 m)  Wt 114 lb 1.9 oz (51.764 kg)  BMI 17.60 kg/m2  PHYSICAL EXAM: Pt is alert and oriented, very thin woman in NAD HEENT: normal Neck: JVP - normal, carotids 2+= without bruits Lungs: CTA bilaterally CV: RRR without murmur or gallop Abd: soft, NT, Positive BS, no hepatomegaly Ext: no C/C/E, distal pulses intact and equal Skin: warm/dry no rash  EKG:  Normal sinus rhythm 66 bpm, possible left atrial enlargement.  ASSESSMENT AND PLAN: 1. Takotsubo cardiomyopathy. Normal LV function by echo next year. Suspect shortness of breath is related to general medical illness and is not cardiac related. She  also tells me that she had a groundglass appearance in her lungs on chest CT. A follow-up CT scan is scheduled for next week. I do not think any further cardiac testing is required as there are no signs or symptoms of congestive heart failure or exertional angina.  2. Hypertension: Controlled on an as a parole and furosemide.  For follow-up I will see her back in one year.  Sherren Mocha,  MD 06/09/2014 9:14 AM

## 2014-06-12 ENCOUNTER — Other Ambulatory Visit: Payer: Self-pay | Admitting: Dermatology

## 2014-06-12 ENCOUNTER — Ambulatory Visit
Admission: RE | Admit: 2014-06-12 | Discharge: 2014-06-12 | Disposition: A | Discharge status: Home / Self Care | Payer: Medicare Other | Source: Ambulatory Visit | Attending: Urology | Admitting: Urology

## 2014-06-12 DIAGNOSIS — R911 Solitary pulmonary nodule: Secondary | ICD-10-CM

## 2014-06-12 MED ORDER — IOHEXOL 300 MG/ML  SOLN
75.0000 mL | Freq: Once | INTRAMUSCULAR | Status: AC | PRN
  Administered 2014-06-12: 75 mL via INTRAVENOUS

## 2014-06-13 ENCOUNTER — Encounter (HOSPITAL_COMMUNITY)
Admission: RE | Admit: 2014-06-13 | Discharge: 2014-06-13 | Disposition: A | Discharge status: Home / Self Care | Payer: Self-pay | Source: Ambulatory Visit | Attending: Cardiovascular Disease | Admitting: Cardiovascular Disease

## 2014-06-20 ENCOUNTER — Encounter (HOSPITAL_COMMUNITY)
Admission: RE | Admit: 2014-06-20 | Discharge: 2014-06-20 | Disposition: A | Discharge status: Home / Self Care | Payer: Self-pay | Source: Ambulatory Visit | Attending: Cardiovascular Disease | Admitting: Cardiovascular Disease

## 2014-06-20 DIAGNOSIS — I429 Cardiomyopathy, unspecified: Secondary | ICD-10-CM | POA: Insufficient documentation

## 2014-06-20 DIAGNOSIS — I059 Rheumatic mitral valve disease, unspecified: Secondary | ICD-10-CM | POA: Insufficient documentation

## 2014-06-20 DIAGNOSIS — I252 Old myocardial infarction: Secondary | ICD-10-CM | POA: Insufficient documentation

## 2014-06-22 ENCOUNTER — Encounter (HOSPITAL_COMMUNITY): Payer: Self-pay

## 2014-06-23 ENCOUNTER — Encounter (HOSPITAL_COMMUNITY): Payer: Self-pay

## 2014-06-27 ENCOUNTER — Encounter (HOSPITAL_COMMUNITY)
Admission: RE | Admit: 2014-06-27 | Discharge: 2014-06-27 | Disposition: A | Discharge status: Home / Self Care | Payer: Self-pay | Source: Ambulatory Visit | Attending: Cardiovascular Disease | Admitting: Cardiovascular Disease

## 2014-06-29 ENCOUNTER — Encounter (HOSPITAL_COMMUNITY): Payer: Self-pay

## 2014-06-30 ENCOUNTER — Encounter (HOSPITAL_COMMUNITY)
Admission: RE | Admit: 2014-06-30 | Discharge: 2014-06-30 | Disposition: A | Discharge status: Home / Self Care | Payer: Self-pay | Source: Ambulatory Visit | Attending: Cardiovascular Disease | Admitting: Cardiovascular Disease

## 2014-06-30 ENCOUNTER — Telehealth: Payer: Self-pay | Admitting: Internal Medicine

## 2014-06-30 NOTE — Telephone Encounter (Signed)
Called and spoke with pt and she stated that she would like to be seen on Monday at 2.  Will have to schedule her PFT after her appt.  Will forward back to MR to make him aware.

## 2014-06-30 NOTE — Telephone Encounter (Signed)
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 She has several problems. REcent CT shows possible MAI and she is very concerned/ Please get her into see me next week of 07/03/14 in PM as consult 30 min slot. Needs full PFT at [place] prior if possible  Thanks  Dr. Brand Males, M.D., [facility].C.P Pulmonary and Critical Care Medicine Staff Physician [place] Pager: [phone], If no answer or between  15:00h - 7:00h: call [phone]  06/30/2014 4:14 PM

## 2014-06-30 NOTE — Telephone Encounter (Signed)
Kirsten Rice is opening for week of 12/14. 12/15, 12/16 in PM starting 2pm each day

## 2014-06-30 NOTE — Telephone Encounter (Signed)
MR-You do not have office opened on the week of 07/03/14. Which day would you want opened?

## 2014-07-03 ENCOUNTER — Ambulatory Visit (INDEPENDENT_AMBULATORY_CARE_PROVIDER_SITE_OTHER): Payer: Medicare Other | Admitting: Internal Medicine

## 2014-07-03 ENCOUNTER — Encounter: Payer: Self-pay | Admitting: Internal Medicine

## 2014-07-03 ENCOUNTER — Other Ambulatory Visit (INDEPENDENT_AMBULATORY_CARE_PROVIDER_SITE_OTHER): Payer: Medicare Other

## 2014-07-03 ENCOUNTER — Other Ambulatory Visit: Payer: Medicare Other

## 2014-07-03 VITALS — BP 154/92 | HR 81 | Ht 67.5 in | Wt 116.4 lb

## 2014-07-03 DIAGNOSIS — R918 Other nonspecific abnormal finding of lung field: Secondary | ICD-10-CM

## 2014-07-03 DIAGNOSIS — R05 Cough: Secondary | ICD-10-CM

## 2014-07-03 DIAGNOSIS — R053 Chronic cough: Secondary | ICD-10-CM

## 2014-07-03 DIAGNOSIS — I7300 Raynaud's syndrome without gangrene: Secondary | ICD-10-CM

## 2014-07-03 DIAGNOSIS — R634 Abnormal weight loss: Secondary | ICD-10-CM

## 2014-07-03 DIAGNOSIS — R61 Generalized hyperhidrosis: Secondary | ICD-10-CM

## 2014-07-03 LAB — SEDIMENTATION RATE: SED RATE: 14 mm/h (ref 0–22)

## 2014-07-03 LAB — RHEUMATOID FACTOR: Rhuematoid fact SerPl-aCnc: <10 IU/mL (ref <=14)

## 2014-07-03 NOTE — Progress Notes (Signed)
Subjective:    Patient ID: Kirsten Rice, female    DOB: 1945-10-19, 68 y.o.   MRN: [id number] PCP [name], MD  HPI  IOV 07/03/2014  Chief Complaint  Patient presents with  . Pulmonary Consult    Pt self referred for possible MAI.     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 Has had Raynaud's x decades but denies autoimmune or rheum workup for same. In Sept 2014 through Feb 2015 was on humira for Crohns and then got flu  (cough) confirmed. Was in bed 6 weeks and so stopped humira. Since then maintaining on Colazal (beeon on it years). In July/aug 2015 had flank pain and underwent CT abd  - lung cut showed RML/RLL 10mm GGO.  ARound same time had rash on knee and elbos - s/p bx DR Ronnald Ramp of [facility]: possible sarcoid. So, se had fu CT chest 06/22/14 and there are findings of MAI (see below)  Last CT chest 2007 is not interpretable due to Admit CHF. In interim eperiod between 2 scans, past 3 months has had B symptoms of fever, night sweats, dry cough, > 10# unitentional weight loss.  PResents for same today. Unclear inciting or relieving factors  Cough score RSI part 1 - 20 suggestive of LPR cough   IMPRESSION: 1. Scattered peribronchovascular nodularity, mucoid impaction and mild bronchiectasis, predominating in the right upper and right middle lobes. Findings favor a chronic infectious process such as mycobacterium avium complex (MAC). 2. Right middle lobe ground-glass subpleural nodule seen on 03/13/2014 is not well appreciated.   Electronically Signed  By: Lorin Picket M.D.  On: 06/12/2014 12:50      Pulmnary toxicity meds  - ACE inhibitor - she is on this - can cause cough  -  Colazal - can rarely cause eosinophilic pneumonia (personally seen one case)    Dr Lorenza Cambridge Reflux Symptom Index (> 13-15 suggestive of LPR cough) 0 -> 5  =  none ->severe problem  Hoarseness of problem with voice 3  Clearing  Of  Throat 2  Excess throat mucus or feeling of post nasal drip 3  Difficulty swallowing food, liquid or tablets 1  Cough after eating or lying down 2  Breathing difficulties or choking episodes 2  Troublesome or annoying cough 3  Sensation of something sticking in throat or lump in throat 1  Heartburn, chest pain, indigestion, or stomach acid coming up 3  TOTAL 20      has a past medical history of Takotsubo cardiomyopathy (2007); Raynaud's disease; Mitral valve prolapse; Crohn's disease; Deep vein thrombosis; Telangiectasia; Hyperthyroidism; GERD (gastroesophageal reflux disease); Myocardial infarction; and Migraines.   reports that she has never smoked. She has never used smokeless tobacco.  Past Surgical History  Procedure Laterality Date  . Shoulder surgery  1996    right  . Tubal ligation  1973  . Appendectomy  1971  . Colonoscopy    . Colonoscopy  05/11/2012    Procedure: COLONOSCOPY;  Surgeon: Lafayette Dragon, MD;  Location: [facility];  Service: Endoscopy;  Laterality: N/A;    Allergies  Allergen Reactions  . Cafergot [Ergotamine-Caffeine] Other (See Comments)    Vagal paralysis  . Compazine [Prochlorperazine Edisylate] Other (See Comments)    paralysis of face  . Prochlorperazine Edisylate Other (See Comments)    REACTION: extrapyrimadal syndrome  . Tetanus-Diphtheria Toxoids Td Swelling    REACTION: arm swells    Immunization  History  Administered Date(s) Administered  . Influenza Split 04/20/2014  . Influenza Whole 04/11/2009  . PPD Test 02/08/2013, 02/07/2014  . Pneumococcal Conjugate-13 10/19/2013    Family History  Problem Relation Age of Onset  . Aortic stenosis Father   . Hypertension Father   . Colon cancer Neg Hx   . Diabetes Sister   . Hyperthyroidism Sister   . Diabetes Sister   . Hyperthyroidism Sister   . Hypertension Mother   . Hyperthyroidism Sister   . Hyperthyroidism Sister     Current outpatient prescriptions: aspirin 81 MG tablet,  Take 81 mg by mouth daily.  , Disp: , Rfl: ;  balsalazide (COLAZAL) 750 MG capsule, Take 2,250 mg by mouth 3 (three) times daily. , Disp: , Rfl: ;  benazepril (LOTENSIN) 10 MG tablet, Take 0.5 tablets (5 mg total) by mouth daily., Disp: 90 tablet, Rfl: 3;  Biotin 2500 MCG CAPS, Take 2,500 mcg by mouth daily. , Disp: , Rfl:  Calcium-Vitamin D 600-200 MG-UNIT per tablet, Take 2 tablets by mouth daily., Disp: , Rfl: ;  furosemide (LASIX) 20 MG tablet, Take 1 tablet (20 mg total) by mouth daily., Disp: 90 tablet, Rfl: 3;  potassium chloride SA (K-DUR,KLOR-CON) 20 MEQ tablet, Take 1 tablet (20 mEq total) by mouth daily., Disp: 90 tablet, Rfl: 3;  vitamin C (ASCORBIC ACID) 500 MG tablet, Take 500 mg by mouth daily.  , Disp: , Rfl:     Review of Systems  Constitutional: Negative for fever and unexpected weight change.  HENT: Positive for nosebleeds and sinus pressure. Negative for congestion, dental problem, ear pain, postnasal drip, rhinorrhea, sneezing, sore throat and trouble swallowing.   Eyes: Negative for redness and itching.  Respiratory: Positive for chest tightness and shortness of breath. Negative for cough and wheezing.   Cardiovascular: Negative for palpitations and leg swelling.  Gastrointestinal: Positive for nausea. Negative for vomiting.  Genitourinary: Negative for dysuria.  Musculoskeletal: Positive for joint swelling.  Skin: Negative for rash.  Neurological: Positive for headaches.  Hematological: Does not bruise/bleed easily.  Psychiatric/Behavioral: Negative for dysphoric mood. The patient is not nervous/anxious.        Objective:   Physical Exam  Constitutional: She is oriented to person, place, and time. She appears well-developed and well-nourished. No distress.  Body mass index is 17.95 kg/(m^2).   HENT:  Head: Normocephalic and atraumatic.  Right Ear: External ear normal.  Left Ear: External ear normal.  Mouth/Throat: Oropharynx is clear and moist. No oropharyngeal  exudate.  Eyes: Conjunctivae and EOM are normal. Pupils are equal, round, and reactive to light. Right eye exhibits no discharge. Left eye exhibits no discharge. No scleral icterus.  Neck: Normal range of motion. Neck supple. No JVD present. No tracheal deviation present. No thyromegaly present.  Cardiovascular: Normal rate, regular rhythm, normal heart sounds and intact distal pulses.  Exam reveals no gallop and no friction rub.   No murmur heard. Pulmonary/Chest: Effort normal and breath sounds normal. No respiratory distress. She has no wheezes. She has no rales. She exhibits no tenderness.  Abdominal: Soft. Bowel sounds are normal. She exhibits no distension and no mass. There is no tenderness. There is no rebound and no guarding.  Musculoskeletal: Normal range of motion. She exhibits no edema or tenderness.  Lymphadenopathy:    She has no cervical adenopathy.  Neurological: She is alert and oriented to person, place, and time. She has normal reflexes. No cranial nerve deficit. She exhibits normal muscle tone. Coordination normal.  Skin: Skin is warm and dry. No rash noted. She is not diaphoretic. No erythema. No pallor.  Psychiatric: She has a normal mood and affect. Her behavior is normal. Judgment and thought content normal.  Vitals reviewed.   Filed Vitals:   07/03/14 1407  BP: 154/92  Pulse: 81  Height: 5' 7.5" (1.715 m)  Weight: 52.8 kg (116 lb 6.5 oz)  SpO2: 96%   Filed Weights   07/03/14 1407  Weight: 52.8 kg (116 lb 6.5 oz)        Assessment & Plan:     ICD-9-CM ICD-10-CM   1. Raynaud phenomenon 443.0 I73.00 Sedimentation rate     ANA     Anti-DNA antibody, double-stranded     Rheumatoid factor     Cyclic citrul peptide antibody, IgG     Sjogrens syndrome-A extractable nuclear antibody     Sjogrens syndrome-B extractable nuclear antibody     Anti-scleroderma antibody     ANCA screen with reflex titer     Mpo/pr-3 (anca) antibodies     CK Total (and CKMB)      Aldolase  2. Pulmonary infiltrate 793.19 R91.8 Sedimentation rate     ANA     Anti-DNA antibody, double-stranded     Rheumatoid factor     Cyclic citrul peptide antibody, IgG     Sjogrens syndrome-A extractable nuclear antibody     Sjogrens syndrome-B extractable nuclear antibody     Anti-scleroderma antibody     ANCA screen with reflex titer     Mpo/pr-3 (anca) antibodies     CK Total (and CKMB)     Aldolase  3. Unexplained night sweats 780.8 R61 Sedimentation rate     ANA     Anti-DNA antibody, double-stranded     Rheumatoid factor     Cyclic citrul peptide antibody, IgG     Sjogrens syndrome-A extractable nuclear antibody     Sjogrens syndrome-B extractable nuclear antibody     Anti-scleroderma antibody     ANCA screen with reflex titer     Mpo/pr-3 (anca) antibodies     CK Total (and CKMB)     Aldolase  4. Weight loss 783.21 R63.4 Sedimentation rate     ANA     Anti-DNA antibody, double-stranded     Rheumatoid factor     Cyclic citrul peptide antibody, IgG     Sjogrens syndrome-A extractable nuclear antibody     Sjogrens syndrome-B extractable nuclear antibody     Anti-scleroderma antibody     ANCA screen with reflex titer     Mpo/pr-3 (anca) antibodies     CK Total (and CKMB)     Aldolase      Do Serum: ESR, ANA, DS-DNA, RF, anti-CCP, ssA, ssB, scl-70, ANCA screen, MPO, PR-3 antibodies, Total CK,  Aldolase,   Do full PFT  Regroup after above  Regroup after above; consider dc ace inhibitor at followup  Remote possibility is eos pneumonia from sulfaslalazine  Future consider other tests for B symptoms    Dr. Brand Males, M.D., [facility].C.P Pulmonary and Critical Care Medicine Staff Physician [facility] Pager: [phone], If no answer or between  15:00h - 7:00h: call [phone]  07/03/2014 2:44 PM

## 2014-07-03 NOTE — Patient Instructions (Addendum)
ICD-9-CM ICD-10-CM   1. Raynaud phenomenon 443.0 I73.00   2. Pulmonary infiltrate 793.19 R91.8   3. Unexplained night sweats 780.8 R61   4. Weight loss 783.21 R63.4   5. CHronic COugh  Do Cough score part 1  Do Serum: ESR, ANA, DS-DNA, RF, anti-CCP, ssA, ssB, scl-70, ANCA screen, MPO, PR-3 antibodies, Total CK,  Aldolase,   Do full PFT  Regroup after above; consider dc ace inhibitor at followup  Remote possibility is eos pneumonia from sulfaslalazine

## 2014-07-04 ENCOUNTER — Encounter (HOSPITAL_COMMUNITY)
Admission: RE | Admit: 2014-07-04 | Discharge: 2014-07-04 | Disposition: A | Discharge status: Home / Self Care | Payer: Self-pay | Source: Ambulatory Visit | Attending: Cardiovascular Disease | Admitting: Cardiovascular Disease

## 2014-07-04 LAB — CK TOTAL AND CKMB (NOT AT ARMC)
CK TOTAL: 82 U/L (ref 7–177)
CK, MB: 1.9 ng/mL (ref 0.0–5.0)

## 2014-07-04 LAB — SJOGRENS SYNDROME-B EXTRACTABLE NUCLEAR ANTIBODY: SSB (La) (ENA) Antibody, IgG: <1

## 2014-07-04 LAB — ANTI-DNA ANTIBODY, DOUBLE-STRANDED: DS DNA AB: 2 [IU]/mL

## 2014-07-04 LAB — MPO/PR-3 (ANCA) ANTIBODIES: Myeloperoxidase Abs: <1

## 2014-07-04 LAB — CYCLIC CITRUL PEPTIDE ANTIBODY, IGG: Cyclic Citrullin Peptide Ab: <2 U/mL (ref 0.0–5.0)

## 2014-07-04 LAB — ANTI-SCLERODERMA ANTIBODY: Scleroderma (Scl-70) (ENA) Antibody, IgG: <1

## 2014-07-04 LAB — SJOGRENS SYNDROME-A EXTRACTABLE NUCLEAR ANTIBODY: SSA (RO) (ENA) ANTIBODY, IGG: NEGATIVE

## 2014-07-04 LAB — ANA: Anti Nuclear Antibody(ANA): NEGATIVE

## 2014-07-05 LAB — ANCA TITERS: P-ANCA: 1:40 {titer} — ABNORMAL HIGH

## 2014-07-05 LAB — ANCA SCREEN W REFLEX TITER
Atypical p-ANCA Screen: NEGATIVE
C-ANCA SCREEN: NEGATIVE
p-ANCA Screen: POSITIVE — AB

## 2014-07-06 ENCOUNTER — Encounter (HOSPITAL_COMMUNITY)
Admission: RE | Admit: 2014-07-06 | Discharge: 2014-07-06 | Disposition: A | Discharge status: Home / Self Care | Payer: Self-pay | Source: Ambulatory Visit | Attending: Cardiovascular Disease | Admitting: Cardiovascular Disease

## 2014-07-06 LAB — ALDOLASE: Aldolase: 6.4 U/L (ref <=8.1)

## 2014-07-07 ENCOUNTER — Encounter (HOSPITAL_COMMUNITY)
Admission: RE | Admit: 2014-07-07 | Discharge: 2014-07-07 | Disposition: A | Discharge status: Home / Self Care | Payer: Self-pay | Source: Ambulatory Visit | Attending: Cardiovascular Disease | Admitting: Cardiovascular Disease

## 2014-07-11 ENCOUNTER — Encounter (HOSPITAL_COMMUNITY)
Admission: RE | Admit: 2014-07-11 | Discharge: 2014-07-11 | Disposition: A | Discharge status: Home / Self Care | Payer: Self-pay | Source: Ambulatory Visit | Attending: Cardiovascular Disease | Admitting: Cardiovascular Disease

## 2014-07-12 ENCOUNTER — Other Ambulatory Visit: Payer: Self-pay | Admitting: Cardiovascular Disease

## 2014-07-12 ENCOUNTER — Encounter (HOSPITAL_COMMUNITY)
Admission: RE | Admit: 2014-07-12 | Discharge: 2014-07-12 | Disposition: A | Discharge status: Home / Self Care | Payer: Self-pay | Source: Ambulatory Visit | Attending: Cardiovascular Disease | Admitting: Cardiovascular Disease

## 2014-07-13 ENCOUNTER — Encounter (HOSPITAL_COMMUNITY): Payer: Self-pay

## 2014-07-16 ENCOUNTER — Other Ambulatory Visit: Payer: Self-pay | Admitting: Cardiovascular Disease

## 2014-07-17 NOTE — Telephone Encounter (Signed)
Furosemide refilled #[id number] with 3 refills on 06/09/14

## 2014-07-18 ENCOUNTER — Encounter (HOSPITAL_COMMUNITY): Payer: Self-pay

## 2014-07-20 ENCOUNTER — Encounter (HOSPITAL_COMMUNITY)
Admission: RE | Admit: 2014-07-20 | Discharge: 2014-07-20 | Disposition: A | Discharge status: Home / Self Care | Payer: Self-pay | Source: Ambulatory Visit | Attending: Cardiovascular Disease | Admitting: Cardiovascular Disease

## 2014-07-24 ENCOUNTER — Ambulatory Visit (INDEPENDENT_AMBULATORY_CARE_PROVIDER_SITE_OTHER): Payer: Medicare Other | Admitting: Internal Medicine

## 2014-07-24 DIAGNOSIS — R053 Chronic cough: Secondary | ICD-10-CM

## 2014-07-24 DIAGNOSIS — R05 Cough: Secondary | ICD-10-CM

## 2014-07-24 DIAGNOSIS — R918 Other nonspecific abnormal finding of lung field: Secondary | ICD-10-CM

## 2014-07-24 LAB — PULMONARY FUNCTION TEST
DL/VA % PRED: 76 %
DL/VA: 4 ml/min/mmHg/L
DLCO unc % pred: 61 %
DLCO unc: 18.13 ml/min/mmHg
FEF 25-75 PRE: 1.76 L/s
FEF 25-75 Post: 2.16 L/sec
FEF2575-%Change-Post: 22 %
FEF2575-%Pred-Post: 102 %
FEF2575-%Pred-Pre: 83 %
FEV1-%Change-Post: 4 %
FEV1-%PRED-PRE: 79 %
FEV1-%Pred-Post: 82 %
FEV1-POST: 2.18 L
FEV1-PRE: 2.1 L
FEV1FVC-%CHANGE-POST: 3 %
FEV1FVC-%PRED-PRE: 102 %
FEV6-%CHANGE-POST: 0 %
FEV6-%Pred-Post: 81 %
FEV6-%Pred-Pre: 81 %
FEV6-PRE: 2.7 L
FEV6-Post: 2.71 L
FEV6FVC-%Change-Post: 0 %
FEV6FVC-%Pred-Post: 103 %
FEV6FVC-%Pred-Pre: 104 %
FVC-%Change-Post: 0 %
FVC-%Pred-Post: 78 %
FVC-%Pred-Pre: 77 %
FVC-Post: 2.73 L
FVC-Pre: 2.7 L
POST FEV1/FVC RATIO: 80 %
POST FEV6/FVC RATIO: 99 %
Pre FEV1/FVC ratio: 78 %
Pre FEV6/FVC Ratio: 100 %
RV % PRED: 103 %
RV: 2.47 L
TLC % PRED: 89 %
TLC: 5.08 L

## 2014-07-24 NOTE — Progress Notes (Signed)
PFT done today. 

## 2014-07-25 ENCOUNTER — Telehealth: Payer: Self-pay | Admitting: Internal Medicine

## 2014-07-25 ENCOUNTER — Encounter (HOSPITAL_COMMUNITY)
Admission: RE | Admit: 2014-07-25 | Discharge: 2014-07-25 | Disposition: A | Discharge status: Home / Self Care | Payer: Self-pay | Source: Ambulatory Visit | Attending: Cardiovascular Disease | Admitting: Cardiovascular Disease

## 2014-07-25 DIAGNOSIS — I059 Rheumatic mitral valve disease, unspecified: Secondary | ICD-10-CM | POA: Insufficient documentation

## 2014-07-25 DIAGNOSIS — I429 Cardiomyopathy, unspecified: Secondary | ICD-10-CM | POA: Insufficient documentation

## 2014-07-25 DIAGNOSIS — I252 Old myocardial infarction: Secondary | ICD-10-CM | POA: Insufficient documentation

## 2014-07-25 NOTE — Telephone Encounter (Signed)
Let her know PFT - slight restriction but autoimmune normal. Will discuss these at followup 08/14/14

## 2014-07-25 NOTE — Telephone Encounter (Signed)
Called and spoke to pt. Informed pt of the results and recs per MR. Pt verbalized understanding. Pt also stated that she is still loosing weight and having chronic headaches and will call her PCP for further recs. Pt aware to keep f/u appt with MR. Nothing further needed.

## 2014-07-27 ENCOUNTER — Encounter (HOSPITAL_COMMUNITY)
Admission: RE | Admit: 2014-07-27 | Discharge: 2014-07-27 | Disposition: A | Discharge status: Home / Self Care | Payer: Self-pay | Source: Ambulatory Visit | Attending: Cardiovascular Disease | Admitting: Cardiovascular Disease

## 2014-07-28 ENCOUNTER — Encounter (HOSPITAL_COMMUNITY)
Admission: RE | Admit: 2014-07-28 | Discharge: 2014-07-28 | Disposition: A | Discharge status: Home / Self Care | Payer: Self-pay | Source: Ambulatory Visit | Attending: Cardiovascular Disease | Admitting: Cardiovascular Disease

## 2014-08-01 ENCOUNTER — Encounter (HOSPITAL_COMMUNITY)
Admission: RE | Admit: 2014-08-01 | Discharge: 2014-08-01 | Disposition: A | Discharge status: Home / Self Care | Payer: Self-pay | Source: Ambulatory Visit | Attending: Cardiovascular Disease | Admitting: Cardiovascular Disease

## 2014-08-01 ENCOUNTER — Institutional Professional Consult (permissible substitution): Payer: Medicare Other | Admitting: Pulmonary Disease

## 2014-08-03 ENCOUNTER — Encounter (HOSPITAL_COMMUNITY)
Admission: RE | Admit: 2014-08-03 | Discharge: 2014-08-03 | Disposition: A | Discharge status: Home / Self Care | Payer: Self-pay | Source: Ambulatory Visit | Attending: Cardiovascular Disease | Admitting: Cardiovascular Disease

## 2014-08-04 ENCOUNTER — Encounter (HOSPITAL_COMMUNITY)
Admission: RE | Admit: 2014-08-04 | Discharge: 2014-08-04 | Disposition: A | Discharge status: Home / Self Care | Payer: Self-pay | Source: Ambulatory Visit | Attending: Cardiovascular Disease | Admitting: Cardiovascular Disease

## 2014-08-08 ENCOUNTER — Encounter (HOSPITAL_COMMUNITY)
Admission: RE | Admit: 2014-08-08 | Discharge: 2014-08-08 | Disposition: A | Discharge status: Home / Self Care | Payer: Self-pay | Source: Ambulatory Visit | Attending: Cardiovascular Disease | Admitting: Cardiovascular Disease

## 2014-08-10 ENCOUNTER — Encounter (HOSPITAL_COMMUNITY)
Admission: RE | Admit: 2014-08-10 | Discharge: 2014-08-10 | Disposition: A | Discharge status: Home / Self Care | Payer: Self-pay | Source: Ambulatory Visit | Attending: Cardiovascular Disease | Admitting: Cardiovascular Disease

## 2014-08-11 ENCOUNTER — Encounter (HOSPITAL_COMMUNITY): Payer: Self-pay

## 2014-08-14 ENCOUNTER — Encounter: Payer: Self-pay | Admitting: Internal Medicine

## 2014-08-14 ENCOUNTER — Ambulatory Visit (INDEPENDENT_AMBULATORY_CARE_PROVIDER_SITE_OTHER): Payer: Medicare Other | Admitting: Internal Medicine

## 2014-08-14 ENCOUNTER — Other Ambulatory Visit: Payer: Self-pay | Admitting: Internal Medicine

## 2014-08-14 VITALS — BP 140/78 | HR 77 | Ht 67.5 in | Wt 118.6 lb

## 2014-08-14 DIAGNOSIS — R05 Cough: Secondary | ICD-10-CM

## 2014-08-14 DIAGNOSIS — R053 Chronic cough: Secondary | ICD-10-CM

## 2014-08-14 DIAGNOSIS — R918 Other nonspecific abnormal finding of lung field: Secondary | ICD-10-CM

## 2014-08-14 DIAGNOSIS — R61 Generalized hyperhidrosis: Secondary | ICD-10-CM | POA: Insufficient documentation

## 2014-08-14 MED ORDER — FLUTICASONE PROPIONATE 50 MCG/ACT NA SUSP: 2.0000 | Freq: Every day | NASAL | Status: DC

## 2014-08-14 NOTE — Progress Notes (Signed)
Subjective:    Patient ID: Kirsten Rice, female    DOB: 1945-08-31, 69 y.o.   MRN: [id number]  HPI   IOV 07/03/2014  Chief Complaint  Patient presents with  . Pulmonary Consult    Pt self referred for possible MAI.     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 Has had Raynaud's x decades but denies autoimmune or rheum workup for same. In Sept 2014 through Feb 2015 was on humira for Crohns and then got flu  (cough) confirmed. Was in bed 6 weeks and so stopped humira. Since then maintaining on Colazal (beeon on it years). In July/aug 2015 had flank pain and underwent CT abd  - lung cut showed RML/RLL 67mm GGO.  ARound same time had rash on knee and elbos - s/p bx DR Ronnald Ramp of [facility]: possible sarcoid. So, se had fu CT chest 06/22/14 and there are findings of MAI (see below)  Last CT chest 2007 is not interpretable due to Admit CHF. In interim eperiod between 2 scans, past 3 months has had B symptoms of fever, night sweats, dry cough, > 10# unitentional weight loss.  PResents for same today. Unclear inciting or relieving factors  Cough score RSI part 1 - 20 suggestive of LPR cough   IMPRESSION: 1. Scattered peribronchovascular nodularity, mucoid impaction and mild bronchiectasis, predominating in the right upper and right middle lobes. Findings favor a chronic infectious process such as mycobacterium avium complex (MAC). 2. Right middle lobe ground-glass subpleural nodule seen on 03/13/2014 is not well appreciated.   Electronically Signed  By: Lorin Picket M.D.  On: 06/12/2014 12:50      Pulmnary toxicity meds  - ACE inhibitor - she is on this - can cause cough  -  Colazal - can rarely cause eosinophilic pneumonia (personally seen one case)   OV 08/14/2014  Chief Complaint  Patient presents with  . Advice Only     Self referral,  PFT done in December 2015; abnormal CT august 2015 found nodule in right lung, then in  November, 2015, another CT showed infection in both lungs.    Follow-up chronic cough  - Here to discuss test results. In the interim cough continues and a mild level. It is dry. It is associated with significant postnasal drainage and ACE inhibitor intake. Her B symptoms off night sweats, chills, weight loss continue although since last visit no weight loss. RSI cough score is at 18 and suggested irritable larynx syndrome as etiology    - Test reviewed  Autoimmune 07/04/15 - negative except trace ANA positive    PFT 07/24/14: shows restriction with low dlco    IMPRESSION: 1. Scattered peribronchovascular nodularity, mucoid impaction and mild bronchiectasis, predominating in the right upper and right middle lobes. Findings favor a chronic infectious process such as mycobacterium avium complex (MAC). 2. Right middle lobe ground-glass subpleural nodule seen on 03/13/2014 is not well appreciated.   Electronically Signed  By: Lorin Picket M.D.  On: 06/12/2014 12:50     Dr Lorenza Cambridge Reflux Symptom Index (> 13-15 suggestive of LPR cough) 07/03/14 08/14/2014   Hoarseness of problem with voice 3 2  Clearing  Of Throat 2 3  Excess throat mucus or feeling of post nasal drip 3 3  Difficulty swallowing food, liquid or tablets 1 0  Cough after eating or lying down 2 2  Breathing difficulties or choking episodes 2 0  Troublesome or annoying cough  3 3  Sensation of something sticking in throat or lump in throat 1 1  Heartburn, chest pain, indigestion, or stomach acid coming up 3 4  TOTAL 20 18     Review of Systems  Constitutional: Negative for fever.  HENT: Positive for sinus pressure. Negative for congestion, dental problem, ear pain, nosebleeds, postnasal drip, rhinorrhea, sneezing, sore throat and trouble swallowing.   Eyes: Negative for redness and itching.  Respiratory: Positive for cough. Negative for chest tightness, shortness of breath and wheezing.   Cardiovascular:  Positive for chest pain. Negative for palpitations and leg swelling.  Gastrointestinal: Negative for nausea and vomiting.  Genitourinary: Negative for dysuria.  Musculoskeletal: Negative for joint swelling.  Skin: Negative for rash.  Neurological: Positive for headaches.  Hematological: Does not bruise/bleed easily.  Psychiatric/Behavioral: Negative for dysphoric mood. The patient is not nervous/anxious.     Current outpatient prescriptions:  .  aspirin 81 MG tablet, Take 81 mg by mouth daily.  , Disp: , Rfl:  .  balsalazide (COLAZAL) 750 MG capsule, Take 2,250 mg by mouth 3 (three) times daily. , Disp: , Rfl:  .  benazepril (LOTENSIN) 10 MG tablet, Take 0.5 tablets (5 mg total) by mouth daily., Disp: 90 tablet, Rfl: 3 .  bifidobacterium infantis (ALIGN) capsule, Take 1 capsule by mouth daily., Disp: , Rfl:  .  Biotin 2500 MCG CAPS, Take 2,500 mcg by mouth daily. , Disp: , Rfl:  .  Calcium-Vitamin D 600-200 MG-UNIT per tablet, Take 2 tablets by mouth daily., Disp: , Rfl:  .  furosemide (LASIX) 20 MG tablet, Take 1 tablet (20 mg total) by mouth daily., Disp: 90 tablet, Rfl: 3 .  potassium chloride SA (K-DUR,KLOR-CON) 20 MEQ tablet, Take 1 tablet (20 mEq total) by mouth daily., Disp: 90 tablet, Rfl: 3 .  vitamin C (ASCORBIC ACID) 500 MG tablet, Take 500 mg by mouth daily.  , Disp: , Rfl:      Objective:   Physical Exam  Filed Vitals:   08/14/14 0853  BP: 140/78  Pulse: 77  Height: 5' 7.5" (1.715 m)  Weight: 118 lb 9.6 oz (53.797 kg)  SpO2: 97%  Estimated body mass index is 18.29 kg/(m^2) as calculated from the following:   Height as of this encounter: 5' 7.5" (1.715 m).   Weight as of this encounter: 118 lb 9.6 oz (53.797 kg).   Discussion only visit    Assessment & Plan:      ICD-9-CM ICD-10-CM   1. Chronic cough 786.2 R05   2. Pulmonary infiltrate 793.19 R91.8   3. Chronic night sweats 780.8 R61    Differential diagnosis for chronic cough is just 1) classic chronic  cough with irritable larynx accentuated by ACE inhibitor and postnasal drainage; 2) autoimmune test panel is negative and therefore hard to explain her B symptoms of night sweats and chills. The pulmonary infiltrates on the right middle lobe suggests possible MAI infection that can explain the B symptoms and the chronic cough  At this point in time she is reluctant to have major investigation a major medical therapy. She generally likes to avoid therapy because of side effects. She is open to simple medical management  Therefore at this point in time - Recommend stop ACE inhibitor. I will check with her cardiologist Dr. Sherren Mocha about a substitute or leave alone - Start saline nasal spray - Start nasal steroids [I had to  Counsel her about the minimal side effects with this medication and reassured her  about the safety profile] - Return in 6 weeks - Consider CT scan of the chest follow-up for pulmonary infiltrates in 6-9 months at this point in time also consider CT scan of the sinus   - If obesity symptoms remain unexplained or get worse at that time consider bronchoscopy with lavage to diagnose Micah Bactrim avium complex infection. The risks, benefits and limitations of diagnosing MAI infection and treating it also discussed    > 50% of this > 25 min visit spent in face to face counseling or coordination of care (15 min visit converted to 25 min)   Dr. Brand Males, M.D., [facility].C.P Pulmonary and Critical Care Medicine Staff Physician [place] [facility] Pager: [phone], If no answer or between  15:00h - 7:00h: call [phone]  08/14/2014 9:43 AM

## 2014-08-14 NOTE — Addendum Note (Signed)
Addended by: Devona Konig on: 08/14/2014 09:48 AM   Modules accepted: Orders

## 2014-08-14 NOTE — Patient Instructions (Signed)
ICD-9-CM ICD-10-CM   1. Chronic cough 786.2 R05   2. Pulmonary infiltrate 793.19 R91.8   3. Chronic night sweats 780.8 R61    -- STop BEnazepril - will check with Dr Burt Knack if you should be on a substitute  - STart saline nasal spray 2 swquirts each nostril daily - START generic fluticasone inhaler 2 squirts each nostril daily   Followup  6 weeks or sooner if needed to see response of above to cough At followup will decide on timing of CT chest and maybe even consider CT sinus

## 2014-08-15 ENCOUNTER — Encounter (HOSPITAL_COMMUNITY): Payer: Self-pay

## 2014-08-17 ENCOUNTER — Encounter (HOSPITAL_COMMUNITY)
Admission: RE | Admit: 2014-08-17 | Discharge: 2014-08-17 | Disposition: A | Discharge status: Home / Self Care | Payer: Self-pay | Source: Ambulatory Visit | Attending: Cardiovascular Disease | Admitting: Cardiovascular Disease

## 2014-08-18 ENCOUNTER — Encounter (HOSPITAL_COMMUNITY)
Admission: RE | Admit: 2014-08-18 | Discharge: 2014-08-18 | Disposition: A | Discharge status: Home / Self Care | Payer: Self-pay | Source: Ambulatory Visit | Attending: Cardiovascular Disease | Admitting: Cardiovascular Disease

## 2014-08-23 ENCOUNTER — Encounter (HOSPITAL_COMMUNITY)
Admission: RE | Admit: 2014-08-23 | Payer: Self-pay | Source: Ambulatory Visit | Attending: Cardiovascular Disease | Admitting: Cardiovascular Disease

## 2014-08-23 DIAGNOSIS — M25562 Pain in left knee: Secondary | ICD-10-CM | POA: Insufficient documentation

## 2014-08-23 DIAGNOSIS — I252 Old myocardial infarction: Secondary | ICD-10-CM | POA: Insufficient documentation

## 2014-08-23 DIAGNOSIS — I429 Cardiomyopathy, unspecified: Secondary | ICD-10-CM | POA: Insufficient documentation

## 2014-08-23 DIAGNOSIS — I059 Rheumatic mitral valve disease, unspecified: Secondary | ICD-10-CM | POA: Insufficient documentation

## 2014-09-01 ENCOUNTER — Encounter (HOSPITAL_COMMUNITY)
Admission: RE | Admit: 2014-09-01 | Discharge: 2014-09-01 | Disposition: A | Discharge status: Home / Self Care | Payer: Self-pay | Source: Ambulatory Visit | Attending: Cardiovascular Disease | Admitting: Cardiovascular Disease

## 2014-09-07 ENCOUNTER — Encounter (HOSPITAL_COMMUNITY)
Admission: RE | Admit: 2014-09-07 | Discharge: 2014-09-07 | Disposition: A | Discharge status: Home / Self Care | Payer: Self-pay | Source: Ambulatory Visit | Attending: Cardiovascular Disease | Admitting: Cardiovascular Disease

## 2014-09-08 ENCOUNTER — Encounter (HOSPITAL_COMMUNITY)
Admission: RE | Admit: 2014-09-08 | Discharge: 2014-09-08 | Disposition: A | Discharge status: Home / Self Care | Payer: Self-pay | Source: Ambulatory Visit | Attending: Cardiovascular Disease | Admitting: Cardiovascular Disease

## 2014-09-12 ENCOUNTER — Encounter (HOSPITAL_COMMUNITY)
Admission: RE | Admit: 2014-09-12 | Discharge: 2014-09-12 | Disposition: A | Discharge status: Home / Self Care | Payer: Self-pay | Source: Ambulatory Visit | Attending: Cardiovascular Disease | Admitting: Cardiovascular Disease

## 2014-09-14 ENCOUNTER — Encounter (HOSPITAL_COMMUNITY)
Admission: RE | Admit: 2014-09-14 | Discharge: 2014-09-14 | Disposition: A | Discharge status: Home / Self Care | Payer: Self-pay | Source: Ambulatory Visit | Attending: Cardiovascular Disease | Admitting: Cardiovascular Disease

## 2014-09-22 ENCOUNTER — Encounter (HOSPITAL_COMMUNITY)
Admission: RE | Admit: 2014-09-22 | Discharge: 2014-09-22 | Disposition: A | Discharge status: Home / Self Care | Payer: Self-pay | Source: Ambulatory Visit | Attending: Cardiovascular Disease | Admitting: Cardiovascular Disease

## 2014-09-22 DIAGNOSIS — I429 Cardiomyopathy, unspecified: Secondary | ICD-10-CM | POA: Insufficient documentation

## 2014-09-22 DIAGNOSIS — I252 Old myocardial infarction: Secondary | ICD-10-CM | POA: Insufficient documentation

## 2014-09-22 DIAGNOSIS — I059 Rheumatic mitral valve disease, unspecified: Secondary | ICD-10-CM | POA: Insufficient documentation

## 2014-09-26 ENCOUNTER — Encounter (HOSPITAL_COMMUNITY)
Admission: RE | Admit: 2014-09-26 | Discharge: 2014-09-26 | Disposition: A | Discharge status: Home / Self Care | Payer: Self-pay | Source: Ambulatory Visit | Attending: Cardiovascular Disease | Admitting: Cardiovascular Disease

## 2014-09-28 ENCOUNTER — Encounter (HOSPITAL_COMMUNITY): Payer: Self-pay

## 2014-09-28 DIAGNOSIS — J479 Bronchiectasis, uncomplicated: Secondary | ICD-10-CM | POA: Insufficient documentation

## 2014-09-29 ENCOUNTER — Encounter (HOSPITAL_COMMUNITY)
Admission: RE | Admit: 2014-09-29 | Discharge: 2014-09-29 | Disposition: A | Discharge status: Home / Self Care | Payer: Self-pay | Source: Ambulatory Visit | Attending: Cardiovascular Disease | Admitting: Cardiovascular Disease

## 2014-10-03 ENCOUNTER — Encounter (HOSPITAL_COMMUNITY): Payer: Self-pay

## 2014-10-05 ENCOUNTER — Encounter (HOSPITAL_COMMUNITY)
Admission: RE | Admit: 2014-10-05 | Discharge: 2014-10-05 | Disposition: A | Discharge status: Home / Self Care | Payer: Self-pay | Source: Ambulatory Visit | Attending: Cardiovascular Disease | Admitting: Cardiovascular Disease

## 2014-10-06 ENCOUNTER — Encounter (HOSPITAL_COMMUNITY)
Admission: RE | Admit: 2014-10-06 | Discharge: 2014-10-06 | Disposition: A | Discharge status: Home / Self Care | Payer: Self-pay | Source: Ambulatory Visit | Attending: Cardiovascular Disease | Admitting: Cardiovascular Disease

## 2014-10-06 ENCOUNTER — Ambulatory Visit (INDEPENDENT_AMBULATORY_CARE_PROVIDER_SITE_OTHER): Payer: Medicare Other | Admitting: Internal Medicine

## 2014-10-06 ENCOUNTER — Encounter: Payer: Self-pay | Admitting: Internal Medicine

## 2014-10-06 VITALS — BP 124/80 | HR 79 | Ht 67.0 in | Wt 117.2 lb

## 2014-10-06 DIAGNOSIS — R61 Generalized hyperhidrosis: Secondary | ICD-10-CM | POA: Diagnosis not present

## 2014-10-06 DIAGNOSIS — R05 Cough: Secondary | ICD-10-CM | POA: Diagnosis not present

## 2014-10-06 DIAGNOSIS — R918 Other nonspecific abnormal finding of lung field: Secondary | ICD-10-CM

## 2014-10-06 DIAGNOSIS — R053 Chronic cough: Secondary | ICD-10-CM

## 2014-10-06 NOTE — Patient Instructions (Addendum)
ICD-9-CM ICD-10-CM   1. Chronic cough 786.2 R05   2. Pulmonary infiltrate 793.19 R91.8   3. Chronic night sweats 780.8 R61    - continue generic fluticasone inhaler 2 squirts each nostril daily - Because cough is not a problem now it is okay to continue benazepril but you can talk to Dr. Sherren Mocha about an alternate if needed - For pulmonary infiltrate do follow-up CT scan of the chest without contrast in November 2016 - I do not think your left facial pain and night sweats are related to lung disease  Followup  - November 2016 after CT scan of the chest

## 2014-10-06 NOTE — Progress Notes (Signed)
Subjective:    Patient ID: Kirsten Rice, female    DOB: 02-20-1946, 69 y.o.   MRN: [id number]  HPI  OV 10/06/2014  Chief Complaint  Patient presents with  . Follow-up    Pt stated her cough is infrequent and only coughs every now and then. Pt c/o left sinus pressure, rhinorrhea, mild cough, headache. pt is contributing her s/s to seasonal allergies. Pt denies SOB. Pt states she has chest tightness occassional while lying down.      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 At that time plan was to stop ACE inhibitor and started nasal steroids and to follow up today to see how the cough is doing. However ACE inhibitor has not been stopped. This is apparently due to the reason that I forgot to communicate with Dr. Sherren Mocha about a substitute. She is taking nasal steroids at this point. In any event her cough is not an issue anymore and is largely resolved according to her history. However for the past one week she is complaining of some left facial pain and headache. She also continues to have chronic night sweats.   Current outpatient prescriptions:  .  aspirin 81 MG tablet, Take 81 mg by mouth daily.  , Disp: , Rfl:  .  balsalazide (COLAZAL) 750 MG capsule, Take 2,250 mg by mouth 3 (three) times daily. , Disp: , Rfl:  .  benazepril (LOTENSIN) 10 MG tablet, Take 0.5 tablets (5 mg total) by mouth daily., Disp: 90 tablet, Rfl: 3 .  bifidobacterium infantis (ALIGN) capsule, Take 1 capsule by mouth daily., Disp: , Rfl:  .  Biotin 2500 MCG CAPS, Take 2,500 mcg by mouth daily. , Disp: , Rfl:  .  Calcium-Vitamin D 600-200 MG-UNIT per tablet, Take 2 tablets by mouth daily., Disp: , Rfl:  .  fluticasone (FLONASE) 50 MCG/ACT nasal spray, USE 2 SPRAYS IN EACH NOSTRIL DAILY, Disp: 48 g, Rfl: 1 .  furosemide (LASIX) 20 MG tablet, Take 1 tablet (20 mg total) by mouth daily., Disp: 90 tablet,  Rfl: 3 .  potassium chloride SA (K-DUR,KLOR-CON) 20 MEQ tablet, Take 1 tablet (20 mEq total) by mouth daily., Disp: 90 tablet, Rfl: 3 .  PRESCRIPTION MEDICATION, Pt uses prescription cream for rosasea, Disp: , Rfl:  .  vitamin C (ASCORBIC ACID) 500 MG tablet, Take 500 mg by mouth daily.  , Disp: , Rfl:    Review of Systems  Constitutional: Negative for fever and unexpected weight change.  HENT: Positive for rhinorrhea and sinus pressure. Negative for congestion, dental problem, ear pain, nosebleeds, postnasal drip, sneezing, sore throat and trouble swallowing.   Eyes: Negative for redness and itching.  Respiratory: Positive for cough. Negative for chest tightness, shortness of breath and wheezing.   Cardiovascular: Negative for palpitations and leg swelling.  Gastrointestinal: Negative for nausea and vomiting.  Genitourinary: Negative for dysuria.  Musculoskeletal: Negative for joint swelling.  Skin: Negative for rash.  Neurological: Negative for headaches.  Hematological: Does not bruise/bleed easily.  Psychiatric/Behavioral: Negative for dysphoric mood. The patient is not nervous/anxious.        Objective:   Physical Exam  Constitutional: She is oriented to person, place, and time. She appears well-developed and well-nourished. No distress.  HENT:  Head: Normocephalic and atraumatic.  Right Ear: External ear normal.  Left Ear: External ear normal.  Mouth/Throat: Oropharynx is clear and moist. No oropharyngeal exudate.  Eyes: Conjunctivae and EOM are normal. Pupils are equal, round, and reactive to light. Right eye exhibits no discharge. Left eye exhibits no discharge. No scleral icterus.  Neck: Normal range of motion. Neck supple. No JVD present. No tracheal deviation present. No thyromegaly present.  Cardiovascular: Normal rate, regular rhythm, normal heart sounds and intact distal pulses.  Exam reveals no gallop and no friction rub.   No murmur heard. Pulmonary/Chest: Effort  normal and breath sounds normal. No respiratory distress. She has no wheezes. She has no rales. She exhibits no tenderness.  Abdominal: Soft. Bowel sounds are normal. She exhibits no distension and no mass. There is no tenderness. There is no rebound and no guarding.  Musculoskeletal: Normal range of motion. She exhibits no edema or tenderness.  Lymphadenopathy:    She has no cervical adenopathy.  Neurological: She is alert and oriented to person, place, and time. She has normal reflexes. No cranial nerve deficit. She exhibits normal muscle tone. Coordination normal.  Skin: Skin is warm and dry. No rash noted. She is not diaphoretic. No erythema. No pallor.  Psychiatric: She has a normal mood and affect. Her behavior is normal. Judgment and thought content normal.  Vitals reviewed.    Filed Vitals:   10/06/14 1357  BP: 124/80  Pulse: 79  Height: 5\' 7"  (1.702 m)  Weight: 53.162 kg (117 lb 3.2 oz)  SpO2: 97%        Assessment & Plan:     ICD-9-CM ICD-10-CM   1. Chronic cough 786.2 R05   2. Pulmonary infiltrate 793.19 R91.8   3. Chronic night sweats 780.8 R61     - continue generic fluticasone inhaler 2 squirts each nostril daily - Because cough is not a problem now it is okay to continue benazepril but you can talk to Dr. Sherren Mocha about an alternate if needed - For pulmonary infiltrate do follow-up CT scan of the chest without contrast in November 2016 - I do not think your left facial pain and night sweats are related to lung disease  Followup  - November 2016 after CT scan of the chest   Dr. Brand Males, M.D., [facility]C.P Pulmonary and Critical Care Medicine Staff Physician [facility] Pager: [phone], If no answer or between  15:00h - 7:00h: call [phone]  10/13/2014 3:10 AM

## 2014-10-10 ENCOUNTER — Encounter (HOSPITAL_COMMUNITY)
Admission: RE | Admit: 2014-10-10 | Discharge: 2014-10-10 | Disposition: A | Discharge status: Home / Self Care | Payer: Self-pay | Source: Ambulatory Visit | Attending: Cardiovascular Disease | Admitting: Cardiovascular Disease

## 2014-10-12 ENCOUNTER — Encounter (HOSPITAL_COMMUNITY)
Admission: RE | Admit: 2014-10-12 | Discharge: 2014-10-12 | Disposition: A | Discharge status: Home / Self Care | Payer: Self-pay | Source: Ambulatory Visit | Attending: Cardiovascular Disease | Admitting: Cardiovascular Disease

## 2014-10-13 ENCOUNTER — Encounter (HOSPITAL_COMMUNITY)
Admission: RE | Admit: 2014-10-13 | Discharge: 2014-10-13 | Disposition: A | Discharge status: Home / Self Care | Payer: Self-pay | Source: Ambulatory Visit | Attending: Cardiovascular Disease | Admitting: Cardiovascular Disease

## 2014-10-17 ENCOUNTER — Encounter (HOSPITAL_COMMUNITY)
Admission: RE | Admit: 2014-10-17 | Discharge: 2014-10-17 | Disposition: A | Discharge status: Home / Self Care | Payer: Self-pay | Source: Ambulatory Visit | Attending: Cardiovascular Disease | Admitting: Cardiovascular Disease

## 2014-10-18 ENCOUNTER — Other Ambulatory Visit (HOSPITAL_COMMUNITY): Payer: Self-pay | Admitting: Obstetrics and Gynecology

## 2014-10-18 DIAGNOSIS — Z1231 Encounter for screening mammogram for malignant neoplasm of breast: Secondary | ICD-10-CM

## 2014-10-19 ENCOUNTER — Encounter (HOSPITAL_COMMUNITY)
Admission: RE | Admit: 2014-10-19 | Discharge: 2014-10-19 | Disposition: A | Discharge status: Home / Self Care | Payer: Self-pay | Source: Ambulatory Visit | Attending: Cardiovascular Disease | Admitting: Cardiovascular Disease

## 2014-10-20 ENCOUNTER — Encounter (HOSPITAL_COMMUNITY)
Admission: RE | Admit: 2014-10-20 | Discharge: 2014-10-20 | Disposition: A | Discharge status: Home / Self Care | Payer: Self-pay | Source: Ambulatory Visit | Attending: Cardiovascular Disease | Admitting: Cardiovascular Disease

## 2014-10-20 DIAGNOSIS — I252 Old myocardial infarction: Secondary | ICD-10-CM | POA: Insufficient documentation

## 2014-10-20 DIAGNOSIS — I059 Rheumatic mitral valve disease, unspecified: Secondary | ICD-10-CM | POA: Insufficient documentation

## 2014-10-20 DIAGNOSIS — I429 Cardiomyopathy, unspecified: Secondary | ICD-10-CM | POA: Insufficient documentation

## 2014-10-24 ENCOUNTER — Encounter (HOSPITAL_COMMUNITY)
Admission: RE | Admit: 2014-10-24 | Discharge: 2014-10-24 | Disposition: A | Discharge status: Home / Self Care | Payer: Self-pay | Source: Ambulatory Visit | Attending: Cardiovascular Disease | Admitting: Cardiovascular Disease

## 2014-10-25 ENCOUNTER — Ambulatory Visit (HOSPITAL_COMMUNITY)
Admission: RE | Admit: 2014-10-25 | Discharge: 2014-10-25 | Disposition: A | Discharge status: Home / Self Care | Payer: Medicare Other | Source: Ambulatory Visit | Attending: Obstetrics and Gynecology | Admitting: Obstetrics and Gynecology

## 2014-10-25 DIAGNOSIS — Z1231 Encounter for screening mammogram for malignant neoplasm of breast: Secondary | ICD-10-CM | POA: Insufficient documentation

## 2014-10-26 ENCOUNTER — Encounter (HOSPITAL_COMMUNITY)
Admission: RE | Admit: 2014-10-26 | Discharge: 2014-10-26 | Disposition: A | Discharge status: Home / Self Care | Payer: Self-pay | Source: Ambulatory Visit | Attending: Cardiovascular Disease | Admitting: Cardiovascular Disease

## 2014-10-27 ENCOUNTER — Encounter (HOSPITAL_COMMUNITY)
Admission: RE | Admit: 2014-10-27 | Discharge: 2014-10-27 | Disposition: A | Discharge status: Home / Self Care | Payer: Self-pay | Source: Ambulatory Visit | Attending: Cardiovascular Disease | Admitting: Cardiovascular Disease

## 2014-10-31 ENCOUNTER — Encounter (HOSPITAL_COMMUNITY)
Admission: RE | Admit: 2014-10-31 | Discharge: 2014-10-31 | Disposition: A | Discharge status: Home / Self Care | Payer: Self-pay | Source: Ambulatory Visit | Attending: Cardiovascular Disease | Admitting: Cardiovascular Disease

## 2014-11-02 ENCOUNTER — Encounter (HOSPITAL_COMMUNITY)
Admission: RE | Admit: 2014-11-02 | Discharge: 2014-11-02 | Disposition: A | Discharge status: Home / Self Care | Payer: Self-pay | Source: Ambulatory Visit | Attending: Cardiovascular Disease | Admitting: Cardiovascular Disease

## 2014-11-03 ENCOUNTER — Encounter (HOSPITAL_COMMUNITY)
Admission: RE | Admit: 2014-11-03 | Discharge: 2014-11-03 | Disposition: A | Discharge status: Home / Self Care | Payer: Self-pay | Source: Ambulatory Visit | Attending: Cardiovascular Disease | Admitting: Cardiovascular Disease

## 2014-11-05 ENCOUNTER — Other Ambulatory Visit: Payer: Self-pay | Admitting: Internal Medicine

## 2014-11-07 ENCOUNTER — Encounter (HOSPITAL_COMMUNITY)
Admission: RE | Admit: 2014-11-07 | Discharge: 2014-11-07 | Disposition: A | Discharge status: Home / Self Care | Payer: Self-pay | Source: Ambulatory Visit | Attending: Cardiovascular Disease | Admitting: Cardiovascular Disease

## 2014-11-08 DIAGNOSIS — M1712 Unilateral primary osteoarthritis, left knee: Secondary | ICD-10-CM | POA: Insufficient documentation

## 2014-11-09 ENCOUNTER — Encounter (HOSPITAL_COMMUNITY)
Admission: RE | Admit: 2014-11-09 | Discharge: 2014-11-09 | Disposition: A | Discharge status: Home / Self Care | Payer: Self-pay | Source: Ambulatory Visit | Attending: Cardiovascular Disease | Admitting: Cardiovascular Disease

## 2014-11-10 ENCOUNTER — Encounter (HOSPITAL_COMMUNITY)
Admission: RE | Admit: 2014-11-10 | Discharge: 2014-11-10 | Disposition: A | Discharge status: Home / Self Care | Payer: Self-pay | Source: Ambulatory Visit | Attending: Cardiovascular Disease | Admitting: Cardiovascular Disease

## 2014-11-13 DIAGNOSIS — M1712 Unilateral primary osteoarthritis, left knee: Secondary | ICD-10-CM | POA: Insufficient documentation

## 2014-11-14 ENCOUNTER — Encounter (HOSPITAL_COMMUNITY)
Admission: RE | Admit: 2014-11-14 | Discharge: 2014-11-14 | Disposition: A | Discharge status: Home / Self Care | Payer: Self-pay | Source: Ambulatory Visit | Attending: Cardiovascular Disease | Admitting: Cardiovascular Disease

## 2014-11-16 ENCOUNTER — Encounter (HOSPITAL_COMMUNITY)
Admission: RE | Admit: 2014-11-16 | Discharge: 2014-11-16 | Disposition: A | Discharge status: Home / Self Care | Payer: Self-pay | Source: Ambulatory Visit | Attending: Cardiovascular Disease | Admitting: Cardiovascular Disease

## 2014-11-17 ENCOUNTER — Encounter (HOSPITAL_COMMUNITY)
Admission: RE | Admit: 2014-11-17 | Discharge: 2014-11-17 | Disposition: A | Discharge status: Home / Self Care | Payer: Self-pay | Source: Ambulatory Visit | Attending: Cardiovascular Disease | Admitting: Cardiovascular Disease

## 2014-11-21 ENCOUNTER — Encounter (HOSPITAL_COMMUNITY): Payer: Medicare Other

## 2014-11-21 DIAGNOSIS — I429 Cardiomyopathy, unspecified: Secondary | ICD-10-CM | POA: Insufficient documentation

## 2014-11-21 DIAGNOSIS — I252 Old myocardial infarction: Secondary | ICD-10-CM | POA: Insufficient documentation

## 2014-11-21 DIAGNOSIS — I059 Rheumatic mitral valve disease, unspecified: Secondary | ICD-10-CM | POA: Insufficient documentation

## 2014-11-23 ENCOUNTER — Encounter (HOSPITAL_COMMUNITY)
Admission: RE | Admit: 2014-11-23 | Discharge: 2014-11-23 | Disposition: A | Discharge status: Home / Self Care | Payer: Self-pay | Source: Ambulatory Visit | Attending: Cardiovascular Disease | Admitting: Cardiovascular Disease

## 2014-11-24 ENCOUNTER — Encounter (HOSPITAL_COMMUNITY): Payer: Self-pay

## 2014-11-28 ENCOUNTER — Encounter (HOSPITAL_COMMUNITY): Payer: Self-pay

## 2014-11-30 ENCOUNTER — Encounter (HOSPITAL_COMMUNITY)
Admission: RE | Admit: 2014-11-30 | Discharge: 2014-11-30 | Disposition: A | Discharge status: Home / Self Care | Payer: Self-pay | Source: Ambulatory Visit | Attending: Cardiovascular Disease | Admitting: Cardiovascular Disease

## 2014-12-01 ENCOUNTER — Encounter (HOSPITAL_COMMUNITY)
Admission: RE | Admit: 2014-12-01 | Discharge: 2014-12-01 | Disposition: A | Discharge status: Home / Self Care | Payer: Self-pay | Source: Ambulatory Visit | Attending: Cardiovascular Disease | Admitting: Cardiovascular Disease

## 2014-12-05 ENCOUNTER — Encounter (HOSPITAL_COMMUNITY)
Admission: RE | Admit: 2014-12-05 | Discharge: 2014-12-05 | Disposition: A | Discharge status: Home / Self Care | Payer: Self-pay | Source: Ambulatory Visit | Attending: Cardiovascular Disease | Admitting: Cardiovascular Disease

## 2014-12-07 ENCOUNTER — Encounter (HOSPITAL_COMMUNITY)
Admission: RE | Admit: 2014-12-07 | Discharge: 2014-12-07 | Disposition: A | Discharge status: Home / Self Care | Payer: Self-pay | Source: Ambulatory Visit | Attending: Cardiovascular Disease | Admitting: Cardiovascular Disease

## 2014-12-08 ENCOUNTER — Encounter (HOSPITAL_COMMUNITY)
Admission: RE | Admit: 2014-12-08 | Discharge: 2014-12-08 | Disposition: A | Discharge status: Home / Self Care | Payer: Self-pay | Source: Ambulatory Visit | Attending: Cardiovascular Disease | Admitting: Cardiovascular Disease

## 2014-12-11 ENCOUNTER — Telehealth: Payer: Self-pay | Admitting: *Deleted

## 2014-12-11 NOTE — Telephone Encounter (Signed)
Left a message for patient to call back. 

## 2014-12-11 NOTE — Telephone Encounter (Signed)
No  Need for TB testing if off biologicals.

## 2014-12-11 NOTE — Telephone Encounter (Signed)
Spoke with patient and she is off Humira and has been since last February. She is doing well.

## 2014-12-11 NOTE — Telephone Encounter (Signed)
Spoke with patient about TB testing for July. She is off Humira and has been since last February(she had pneumonia). She is doing well.

## 2014-12-12 ENCOUNTER — Encounter (HOSPITAL_COMMUNITY): Payer: Self-pay

## 2014-12-14 ENCOUNTER — Encounter (HOSPITAL_COMMUNITY)
Admission: RE | Admit: 2014-12-14 | Discharge: 2014-12-14 | Disposition: A | Discharge status: Home / Self Care | Payer: Self-pay | Source: Ambulatory Visit | Attending: Cardiovascular Disease | Admitting: Cardiovascular Disease

## 2014-12-15 ENCOUNTER — Encounter (HOSPITAL_COMMUNITY)
Admission: RE | Admit: 2014-12-15 | Discharge: 2014-12-15 | Disposition: A | Discharge status: Home / Self Care | Payer: Self-pay | Source: Ambulatory Visit | Attending: Cardiovascular Disease | Admitting: Cardiovascular Disease

## 2014-12-19 ENCOUNTER — Encounter (HOSPITAL_COMMUNITY)
Admission: RE | Admit: 2014-12-19 | Discharge: 2014-12-19 | Disposition: A | Discharge status: Home / Self Care | Payer: Self-pay | Source: Ambulatory Visit | Attending: Cardiovascular Disease | Admitting: Cardiovascular Disease

## 2014-12-21 ENCOUNTER — Encounter (HOSPITAL_COMMUNITY)
Admission: RE | Admit: 2014-12-21 | Discharge: 2014-12-21 | Disposition: A | Discharge status: Home / Self Care | Payer: Self-pay | Source: Ambulatory Visit | Attending: Cardiovascular Disease | Admitting: Cardiovascular Disease

## 2014-12-21 DIAGNOSIS — I059 Rheumatic mitral valve disease, unspecified: Secondary | ICD-10-CM | POA: Insufficient documentation

## 2014-12-21 DIAGNOSIS — I252 Old myocardial infarction: Secondary | ICD-10-CM | POA: Insufficient documentation

## 2014-12-21 DIAGNOSIS — I429 Cardiomyopathy, unspecified: Secondary | ICD-10-CM | POA: Insufficient documentation

## 2014-12-22 ENCOUNTER — Encounter (HOSPITAL_COMMUNITY)
Admission: RE | Admit: 2014-12-22 | Discharge: 2014-12-22 | Disposition: A | Discharge status: Home / Self Care | Payer: Self-pay | Source: Ambulatory Visit | Attending: Cardiovascular Disease | Admitting: Cardiovascular Disease

## 2014-12-26 ENCOUNTER — Encounter (HOSPITAL_COMMUNITY)
Admission: RE | Admit: 2014-12-26 | Discharge: 2014-12-26 | Disposition: A | Discharge status: Home / Self Care | Payer: Self-pay | Source: Ambulatory Visit | Attending: Cardiovascular Disease | Admitting: Cardiovascular Disease

## 2014-12-28 ENCOUNTER — Encounter (HOSPITAL_COMMUNITY)
Admission: RE | Admit: 2014-12-28 | Discharge: 2014-12-28 | Disposition: A | Discharge status: Home / Self Care | Payer: Self-pay | Source: Ambulatory Visit | Attending: Cardiovascular Disease | Admitting: Cardiovascular Disease

## 2014-12-29 ENCOUNTER — Encounter (HOSPITAL_COMMUNITY)
Admission: RE | Admit: 2014-12-29 | Discharge: 2014-12-29 | Disposition: A | Discharge status: Home / Self Care | Payer: Self-pay | Source: Ambulatory Visit | Attending: Cardiovascular Disease | Admitting: Cardiovascular Disease

## 2015-01-02 ENCOUNTER — Encounter (HOSPITAL_COMMUNITY)
Admission: RE | Admit: 2015-01-02 | Discharge: 2015-01-02 | Disposition: A | Discharge status: Home / Self Care | Payer: Self-pay | Source: Ambulatory Visit | Attending: Cardiovascular Disease | Admitting: Cardiovascular Disease

## 2015-01-04 ENCOUNTER — Encounter (HOSPITAL_COMMUNITY)
Admission: RE | Admit: 2015-01-04 | Discharge: 2015-01-04 | Disposition: A | Discharge status: Home / Self Care | Payer: Self-pay | Source: Ambulatory Visit | Attending: Cardiovascular Disease | Admitting: Cardiovascular Disease

## 2015-01-05 ENCOUNTER — Encounter (HOSPITAL_COMMUNITY)
Admission: RE | Admit: 2015-01-05 | Discharge: 2015-01-05 | Disposition: A | Discharge status: Home / Self Care | Payer: Self-pay | Source: Ambulatory Visit | Attending: Cardiovascular Disease | Admitting: Cardiovascular Disease

## 2015-01-09 ENCOUNTER — Encounter (HOSPITAL_COMMUNITY)
Admission: RE | Admit: 2015-01-09 | Discharge: 2015-01-09 | Disposition: A | Discharge status: Home / Self Care | Payer: Self-pay | Source: Ambulatory Visit | Attending: Cardiovascular Disease | Admitting: Cardiovascular Disease

## 2015-01-11 ENCOUNTER — Encounter (HOSPITAL_COMMUNITY)
Admission: RE | Admit: 2015-01-11 | Discharge: 2015-01-11 | Disposition: A | Discharge status: Home / Self Care | Payer: Self-pay | Source: Ambulatory Visit | Attending: Cardiovascular Disease | Admitting: Cardiovascular Disease

## 2015-01-12 ENCOUNTER — Encounter (HOSPITAL_COMMUNITY)
Admission: RE | Admit: 2015-01-12 | Discharge: 2015-01-12 | Disposition: A | Discharge status: Home / Self Care | Payer: Self-pay | Source: Ambulatory Visit | Attending: Cardiovascular Disease | Admitting: Cardiovascular Disease

## 2015-01-15 ENCOUNTER — Other Ambulatory Visit: Payer: Self-pay

## 2015-01-16 ENCOUNTER — Encounter (HOSPITAL_COMMUNITY)
Admission: RE | Admit: 2015-01-16 | Discharge: 2015-01-16 | Disposition: A | Discharge status: Home / Self Care | Payer: Self-pay | Source: Ambulatory Visit | Attending: Cardiovascular Disease | Admitting: Cardiovascular Disease

## 2015-01-18 ENCOUNTER — Encounter (HOSPITAL_COMMUNITY)
Admission: RE | Admit: 2015-01-18 | Discharge: 2015-01-18 | Disposition: A | Discharge status: Home / Self Care | Payer: Self-pay | Source: Ambulatory Visit | Attending: Cardiovascular Disease | Admitting: Cardiovascular Disease

## 2015-01-19 ENCOUNTER — Encounter (HOSPITAL_COMMUNITY)
Admission: RE | Admit: 2015-01-19 | Discharge: 2015-01-19 | Disposition: A | Discharge status: Home / Self Care | Payer: Self-pay | Source: Ambulatory Visit | Attending: Cardiovascular Disease | Admitting: Cardiovascular Disease

## 2015-01-19 DIAGNOSIS — I252 Old myocardial infarction: Secondary | ICD-10-CM | POA: Insufficient documentation

## 2015-01-19 DIAGNOSIS — I059 Rheumatic mitral valve disease, unspecified: Secondary | ICD-10-CM | POA: Insufficient documentation

## 2015-01-19 DIAGNOSIS — I429 Cardiomyopathy, unspecified: Secondary | ICD-10-CM | POA: Insufficient documentation

## 2015-01-23 ENCOUNTER — Encounter (HOSPITAL_COMMUNITY)
Admission: RE | Admit: 2015-01-23 | Discharge: 2015-01-23 | Disposition: A | Discharge status: Home / Self Care | Payer: Self-pay | Source: Ambulatory Visit | Attending: Cardiovascular Disease | Admitting: Cardiovascular Disease

## 2015-01-25 ENCOUNTER — Encounter (HOSPITAL_COMMUNITY)
Admission: RE | Admit: 2015-01-25 | Discharge: 2015-01-25 | Disposition: A | Discharge status: Home / Self Care | Payer: Self-pay | Source: Ambulatory Visit | Attending: Cardiovascular Disease | Admitting: Cardiovascular Disease

## 2015-01-26 ENCOUNTER — Encounter (HOSPITAL_COMMUNITY)
Admission: RE | Admit: 2015-01-26 | Discharge: 2015-01-26 | Disposition: A | Discharge status: Home / Self Care | Payer: Self-pay | Source: Ambulatory Visit | Attending: Cardiovascular Disease | Admitting: Cardiovascular Disease

## 2015-01-30 ENCOUNTER — Encounter (HOSPITAL_COMMUNITY)
Admission: RE | Admit: 2015-01-30 | Discharge: 2015-01-30 | Disposition: A | Discharge status: Home / Self Care | Payer: Self-pay | Source: Ambulatory Visit | Attending: Cardiovascular Disease | Admitting: Cardiovascular Disease

## 2015-01-31 ENCOUNTER — Encounter (HOSPITAL_COMMUNITY): Payer: Self-pay | Admitting: Emergency Medicine

## 2015-01-31 ENCOUNTER — Inpatient Hospital Stay (HOSPITAL_COMMUNITY)
Admission: EM | Admit: 2015-01-31 | Discharge: 2015-02-02 | DRG: 281 | Disposition: A | Discharge status: Home / Self Care | Payer: Medicare Other | Attending: Cardiology | Admitting: Cardiology

## 2015-01-31 ENCOUNTER — Encounter (HOSPITAL_COMMUNITY)
Admission: EM | Disposition: A | Discharge status: Home / Self Care | Payer: Self-pay | Source: Home / Self Care | Attending: Cardiology

## 2015-01-31 ENCOUNTER — Emergency Department (HOSPITAL_COMMUNITY): Payer: Medicare Other

## 2015-01-31 DIAGNOSIS — I341 Nonrheumatic mitral (valve) prolapse: Secondary | ICD-10-CM | POA: Diagnosis present

## 2015-01-31 DIAGNOSIS — Z887 Allergy status to serum and vaccine status: Secondary | ICD-10-CM

## 2015-01-31 DIAGNOSIS — Z681 Body mass index (BMI) 19 or less, adult: Secondary | ICD-10-CM

## 2015-01-31 DIAGNOSIS — Z888 Allergy status to other drugs, medicaments and biological substances status: Secondary | ICD-10-CM

## 2015-01-31 DIAGNOSIS — K5090 Crohn's disease, unspecified, without complications: Secondary | ICD-10-CM | POA: Diagnosis present

## 2015-01-31 DIAGNOSIS — R079 Chest pain, unspecified: Secondary | ICD-10-CM | POA: Diagnosis not present

## 2015-01-31 DIAGNOSIS — I5181 Takotsubo syndrome: Secondary | ICD-10-CM | POA: Diagnosis present

## 2015-01-31 DIAGNOSIS — Z8249 Family history of ischemic heart disease and other diseases of the circulatory system: Secondary | ICD-10-CM | POA: Diagnosis not present

## 2015-01-31 DIAGNOSIS — K50919 Crohn's disease, unspecified, with unspecified complications: Secondary | ICD-10-CM | POA: Diagnosis present

## 2015-01-31 DIAGNOSIS — K219 Gastro-esophageal reflux disease without esophagitis: Secondary | ICD-10-CM | POA: Diagnosis present

## 2015-01-31 DIAGNOSIS — I252 Old myocardial infarction: Secondary | ICD-10-CM | POA: Diagnosis not present

## 2015-01-31 DIAGNOSIS — I429 Cardiomyopathy, unspecified: Secondary | ICD-10-CM | POA: Diagnosis present

## 2015-01-31 DIAGNOSIS — R64 Cachexia: Secondary | ICD-10-CM | POA: Diagnosis present

## 2015-01-31 DIAGNOSIS — J984 Other disorders of lung: Secondary | ICD-10-CM | POA: Diagnosis present

## 2015-01-31 DIAGNOSIS — Z7982 Long term (current) use of aspirin: Secondary | ICD-10-CM | POA: Diagnosis not present

## 2015-01-31 DIAGNOSIS — R7989 Other specified abnormal findings of blood chemistry: Secondary | ICD-10-CM

## 2015-01-31 DIAGNOSIS — R072 Precordial pain: Secondary | ICD-10-CM | POA: Diagnosis present

## 2015-01-31 DIAGNOSIS — R52 Pain, unspecified: Secondary | ICD-10-CM

## 2015-01-31 DIAGNOSIS — I7300 Raynaud's syndrome without gangrene: Secondary | ICD-10-CM | POA: Diagnosis present

## 2015-01-31 DIAGNOSIS — I10 Essential (primary) hypertension: Secondary | ICD-10-CM | POA: Diagnosis present

## 2015-01-31 DIAGNOSIS — I214 Non-ST elevation (NSTEMI) myocardial infarction: Principal | ICD-10-CM | POA: Diagnosis present

## 2015-01-31 DIAGNOSIS — Z7951 Long term (current) use of inhaled steroids: Secondary | ICD-10-CM | POA: Diagnosis not present

## 2015-01-31 HISTORY — PX: CARDIAC CATHETERIZATION: SHX172

## 2015-01-31 HISTORY — DX: Basal cell carcinoma of skin, unspecified: C44.91

## 2015-01-31 HISTORY — DX: Nausea with vomiting, unspecified: R11.2

## 2015-01-31 HISTORY — DX: Other specified postprocedural states: Z98.890

## 2015-01-31 HISTORY — DX: Unspecified osteoarthritis, unspecified site: M19.90

## 2015-01-31 HISTORY — DX: Reserved for concepts with insufficient information to code with codable children: IMO0002

## 2015-01-31 LAB — CBC WITH DIFFERENTIAL/PLATELET
Basophils Absolute: 0.1 10*3/uL (ref 0.0–0.1)
Basophils Relative: 0 % (ref 0–1)
EOS PCT: 1 % (ref 0–5)
Eosinophils Absolute: 0.1 10*3/uL (ref 0.0–0.7)
HCT: 39.8 % (ref 36.0–46.0)
HEMOGLOBIN: 13.1 g/dL (ref 12.0–15.0)
LYMPHS ABS: 1 10*3/uL (ref 0.7–4.0)
LYMPHS PCT: 9 % — AB (ref 12–46)
MCH: 30 pg (ref 26.0–34.0)
MCHC: 32.9 g/dL (ref 30.0–36.0)
MCV: 91.3 fL (ref 78.0–100.0)
MONOS PCT: 4 % (ref 3–12)
Monocytes Absolute: 0.5 10*3/uL (ref 0.1–1.0)
Neutro Abs: 9.7 10*3/uL — ABNORMAL HIGH (ref 1.7–7.7)
Neutrophils Relative %: 86 % — ABNORMAL HIGH (ref 43–77)
Platelets: 256 10*3/uL (ref 150–400)
RBC: 4.36 MIL/uL (ref 3.87–5.11)
RDW: 13.1 % (ref 11.5–15.5)
WBC: 11.3 10*3/uL — AB (ref 4.0–10.5)

## 2015-01-31 LAB — I-STAT CHEM 8, ED
BUN: 16 mg/dL (ref 6–20)
CHLORIDE: 98 mmol/L — AB (ref 101–111)
Calcium, Ion: 1.09 mmol/L — ABNORMAL LOW (ref 1.13–1.30)
Creatinine, Ser: 0.8 mg/dL (ref 0.44–1.00)
Glucose, Bld: 152 mg/dL — ABNORMAL HIGH (ref 65–99)
HEMATOCRIT: 43 % (ref 36.0–46.0)
Hemoglobin: 14.6 g/dL (ref 12.0–15.0)
Potassium: 4.2 mmol/L (ref 3.5–5.1)
Sodium: 135 mmol/L (ref 135–145)
TCO2: 24 mmol/L (ref 0–100)

## 2015-01-31 LAB — T4, FREE: Free T4: 1.2 ng/dL — ABNORMAL HIGH (ref 0.61–1.12)

## 2015-01-31 LAB — TROPONIN I
TROPONIN I: 2.12 ng/mL — AB (ref <0.031)
Troponin I: 1.61 ng/mL (ref <0.031)
Troponin I: 1.6 ng/mL (ref <0.031)

## 2015-01-31 LAB — I-STAT TROPONIN, ED: Troponin i, poc: 1.92 ng/mL (ref 0.00–0.08)

## 2015-01-31 LAB — LIPASE, BLOOD: Lipase: 17 U/L — ABNORMAL LOW (ref 22–51)

## 2015-01-31 LAB — BRAIN NATRIURETIC PEPTIDE: B NATRIURETIC PEPTIDE 5: 1000.8 pg/mL — AB (ref 0.0–100.0)

## 2015-01-31 LAB — PROTIME-INR
INR: 1 (ref 0.00–1.49)
PROTHROMBIN TIME: 13.4 s (ref 11.6–15.2)

## 2015-01-31 LAB — TSH: TSH: 1.603 u[IU]/mL (ref 0.350–4.500)

## 2015-01-31 SURGERY — LEFT HEART CATH AND CORONARY ANGIOGRAPHY

## 2015-01-31 MED ORDER — HEPARIN (PORCINE) IN NACL 2-0.9 UNIT/ML-% IJ SOLN
INTRAMUSCULAR | Status: AC
  Filled 2015-01-31: qty 500

## 2015-01-31 MED ORDER — ATORVASTATIN CALCIUM 80 MG PO TABS
80.0000 mg | ORAL_TABLET | Freq: Every day | ORAL | Status: DC
  Filled 2015-01-31: qty 1

## 2015-01-31 MED ORDER — LIDOCAINE HCL (PF) 1 % IJ SOLN
INTRAMUSCULAR | Status: AC
  Filled 2015-01-31: qty 30

## 2015-01-31 MED ORDER — ACETAMINOPHEN 325 MG PO TABS
650.0000 mg | ORAL_TABLET | ORAL | Status: DC | PRN
  Filled 2015-01-31: qty 2

## 2015-01-31 MED ORDER — FLUTICASONE PROPIONATE 50 MCG/ACT NA SUSP
2.0000 | Freq: Every day | NASAL | Status: DC
  Filled 2015-01-31: qty 16

## 2015-01-31 MED ORDER — ONDANSETRON HCL 4 MG/2ML IJ SOLN
INTRAMUSCULAR | Status: DC | PRN
  Administered 2015-01-31: 4 mg via INTRAVENOUS

## 2015-01-31 MED ORDER — ASPIRIN 81 MG PO CHEW: 81.0000 mg | CHEWABLE_TABLET | ORAL | Status: DC

## 2015-01-31 MED ORDER — LIDOCAINE HCL (PF) 1 % IJ SOLN
INTRAMUSCULAR | Status: DC | PRN
  Administered 2015-01-31: 2 mL via SUBCUTANEOUS

## 2015-01-31 MED ORDER — NITROGLYCERIN 1 MG/10 ML FOR IR/CATH LAB
INTRA_ARTERIAL | Status: AC
  Filled 2015-01-31: qty 10

## 2015-01-31 MED ORDER — ASPIRIN 81 MG PO TABS: 81.0000 mg | ORAL_TABLET | Freq: Every day | ORAL | Status: DC

## 2015-01-31 MED ORDER — SODIUM CHLORIDE 0.9 % IV SOLN: 250.0000 mL | INTRAVENOUS | Status: DC | PRN

## 2015-01-31 MED ORDER — HEPARIN SODIUM (PORCINE) 1000 UNIT/ML IJ SOLN
INTRAMUSCULAR | Status: AC
  Filled 2015-01-31: qty 1

## 2015-01-31 MED ORDER — NITROGLYCERIN 0.4 MG SL SUBL
0.4000 mg | SUBLINGUAL_TABLET | SUBLINGUAL | Status: DC | PRN
  Administered 2015-01-31: 0.4 mg via SUBLINGUAL
  Filled 2015-01-31: qty 1

## 2015-01-31 MED ORDER — ALIGN PO CAPS: 1.0000 | ORAL_CAPSULE | Freq: Every day | ORAL | Status: DC

## 2015-01-31 MED ORDER — SODIUM CHLORIDE 0.9 % IJ SOLN: 3.0000 mL | INTRAMUSCULAR | Status: DC | PRN

## 2015-01-31 MED ORDER — SODIUM CHLORIDE 0.9 % IJ SOLN
3.0000 mL | Freq: Two times a day (BID) | INTRAMUSCULAR | Status: DC
  Administered 2015-02-01 – 2015-02-02 (×2): 3 mL via INTRAVENOUS

## 2015-01-31 MED ORDER — BALSALAZIDE DISODIUM 750 MG PO CAPS
2250.0000 mg | ORAL_CAPSULE | Freq: Three times a day (TID) | ORAL | Status: DC
  Administered 2015-01-31 – 2015-02-02 (×6): 2250 mg via ORAL
  Filled 2015-01-31 (×9): qty 3

## 2015-01-31 MED ORDER — NITROGLYCERIN 2 % TD OINT
0.5000 [in_us] | TOPICAL_OINTMENT | Freq: Once | TRANSDERMAL | Status: AC
  Administered 2015-01-31: 0.5 [in_us] via TOPICAL
  Filled 2015-01-31: qty 1

## 2015-01-31 MED ORDER — ASPIRIN 81 MG PO CHEW: 324.0000 mg | CHEWABLE_TABLET | Freq: Once | ORAL | Status: DC

## 2015-01-31 MED ORDER — SODIUM CHLORIDE 0.9 % IJ SOLN: 3.0000 mL | Freq: Two times a day (BID) | INTRAMUSCULAR | Status: DC

## 2015-01-31 MED ORDER — VERAPAMIL HCL 2.5 MG/ML IV SOLN
INTRAVENOUS | Status: AC
  Filled 2015-01-31: qty 2

## 2015-01-31 MED ORDER — CALCIUM CARBONATE-VITAMIN D 500-200 MG-UNIT PO TABS
1.0000 | ORAL_TABLET | Freq: Two times a day (BID) | ORAL | Status: DC
  Filled 2015-01-31 (×3): qty 1

## 2015-01-31 MED ORDER — CALCIUM-VITAMIN D 600-200 MG-UNIT PO TABS: 2.0000 | ORAL_TABLET | Freq: Every day | ORAL | Status: DC

## 2015-01-31 MED ORDER — FENTANYL CITRATE (PF) 100 MCG/2ML IJ SOLN
25.0000 ug | INTRAMUSCULAR | Status: DC | PRN
  Administered 2015-01-31 (×2): 25 ug via INTRAVENOUS
  Filled 2015-01-31 (×2): qty 2

## 2015-01-31 MED ORDER — BIOTIN 2500 MCG PO CAPS: 2500.0000 ug | ORAL_CAPSULE | Freq: Every day | ORAL | Status: DC

## 2015-01-31 MED ORDER — ASPIRIN 81 MG PO CHEW
81.0000 mg | CHEWABLE_TABLET | Freq: Every day | ORAL | Status: DC
  Administered 2015-02-02: 81 mg via ORAL
  Filled 2015-01-31 (×2): qty 1

## 2015-01-31 MED ORDER — METOPROLOL TARTRATE 12.5 MG HALF TABLET
12.5000 mg | ORAL_TABLET | Freq: Two times a day (BID) | ORAL | Status: DC
  Administered 2015-02-01: 12.5 mg via ORAL
  Filled 2015-01-31: qty 1

## 2015-01-31 MED ORDER — HEPARIN (PORCINE) IN NACL 2-0.9 UNIT/ML-% IJ SOLN
INTRAMUSCULAR | Status: AC
  Filled 2015-01-31: qty 1000

## 2015-01-31 MED ORDER — SODIUM CHLORIDE 0.9 % IV SOLN
INTRAVENOUS | Status: DC
  Administered 2015-01-31: 11:00:00 via INTRAVENOUS

## 2015-01-31 MED ORDER — SODIUM CHLORIDE 0.9 % WEIGHT BASED INFUSION
1.0000 mL/kg/h | INTRAVENOUS | Status: AC
  Administered 2015-01-31: 1 mL/kg/h via INTRAVENOUS

## 2015-01-31 MED ORDER — SODIUM CHLORIDE 0.9 % IJ SOLN
3.0000 mL | Freq: Two times a day (BID) | INTRAMUSCULAR | Status: DC
  Administered 2015-01-31 – 2015-02-01 (×2): 3 mL via INTRAVENOUS

## 2015-01-31 MED ORDER — HEPARIN BOLUS VIA INFUSION
3000.0000 [IU] | Freq: Once | INTRAVENOUS | Status: AC
  Administered 2015-01-31: 3000 [IU] via INTRAVENOUS
  Filled 2015-01-31: qty 3000

## 2015-01-31 MED ORDER — HEPARIN (PORCINE) IN NACL 100-0.45 UNIT/ML-% IJ SOLN
650.0000 [IU]/h | INTRAMUSCULAR | Status: DC
  Administered 2015-01-31: 650 [IU]/h via INTRAVENOUS
  Filled 2015-01-31: qty 250

## 2015-01-31 MED ORDER — FENTANYL CITRATE (PF) 100 MCG/2ML IJ SOLN
INTRAMUSCULAR | Status: AC
  Filled 2015-01-31: qty 2

## 2015-01-31 MED ORDER — ONDANSETRON HCL 4 MG/2ML IJ SOLN
INTRAMUSCULAR | Status: AC
  Filled 2015-01-31: qty 2

## 2015-01-31 MED ORDER — ACETAMINOPHEN 325 MG PO TABS
650.0000 mg | ORAL_TABLET | ORAL | Status: DC | PRN
  Administered 2015-01-31: 650 mg via ORAL
  Filled 2015-01-31: qty 2

## 2015-01-31 MED ORDER — ONDANSETRON HCL 4 MG/2ML IJ SOLN
4.0000 mg | Freq: Four times a day (QID) | INTRAMUSCULAR | Status: DC | PRN
  Administered 2015-01-31: 4 mg via INTRAVENOUS
  Filled 2015-01-31: qty 2

## 2015-01-31 MED ORDER — RISAQUAD PO CAPS
1.0000 | ORAL_CAPSULE | Freq: Every day | ORAL | Status: DC
  Administered 2015-02-01 – 2015-02-02 (×2): 1 via ORAL
  Filled 2015-01-31 (×2): qty 1

## 2015-01-31 MED ORDER — ALUM & MAG HYDROXIDE-SIMETH 200-200-20 MG/5ML PO SUSP
30.0000 mL | Freq: Once | ORAL | Status: AC
  Administered 2015-02-01: 30 mL via ORAL
  Filled 2015-01-31: qty 30

## 2015-01-31 MED ORDER — MIDAZOLAM HCL 2 MG/2ML IJ SOLN
INTRAMUSCULAR | Status: AC
  Filled 2015-01-31: qty 2

## 2015-01-31 MED ORDER — BALSALAZIDE DISODIUM 750 MG PO CAPS: 2250.0000 mg | ORAL_CAPSULE | Freq: Three times a day (TID) | ORAL | Status: DC

## 2015-01-31 MED ORDER — IOHEXOL 350 MG/ML SOLN
INTRAVENOUS | Status: DC | PRN
  Administered 2015-01-31: 50 mL via INTRACARDIAC

## 2015-01-31 MED ORDER — ONDANSETRON HCL 4 MG/2ML IJ SOLN
4.0000 mg | Freq: Four times a day (QID) | INTRAMUSCULAR | Status: DC | PRN
  Administered 2015-01-31 – 2015-02-01 (×3): 4 mg via INTRAVENOUS
  Filled 2015-01-31 (×3): qty 2

## 2015-01-31 MED ORDER — FENTANYL CITRATE (PF) 100 MCG/2ML IJ SOLN
INTRAMUSCULAR | Status: DC | PRN
  Administered 2015-01-31: 25 ug via INTRAVENOUS

## 2015-01-31 MED ORDER — VITAMIN C 500 MG PO TABS
500.0000 mg | ORAL_TABLET | Freq: Every day | ORAL | Status: DC
  Administered 2015-02-02: 500 mg via ORAL
  Filled 2015-01-31 (×2): qty 1

## 2015-01-31 MED ORDER — MIDAZOLAM HCL 2 MG/2ML IJ SOLN
INTRAMUSCULAR | Status: DC | PRN
  Administered 2015-01-31: 1 mg via INTRAVENOUS

## 2015-01-31 MED ORDER — VERAPAMIL HCL 2.5 MG/ML IV SOLN
INTRAVENOUS | Status: DC | PRN
  Administered 2015-01-31: 14:00:00 via INTRA_ARTERIAL

## 2015-01-31 MED ORDER — ONDANSETRON HCL 4 MG/2ML IJ SOLN
4.0000 mg | Freq: Once | INTRAMUSCULAR | Status: AC
  Administered 2015-01-31: 4 mg via INTRAVENOUS
  Filled 2015-01-31: qty 2

## 2015-01-31 SURGICAL SUPPLY — 11 items

## 2015-01-31 NOTE — ED Notes (Signed)
Patient placed on oxygen d/t O2 sats dropping into the 80s. Oxygen 2 LPM via nasal cannula applied.

## 2015-01-31 NOTE — H&P (Signed)
Kirsten Rice is an 69 y.o. female.    Chief Complaint: chest pain Primary cardiologist: Dr. Burt Knack HPI: Kirsten Rice is a 69 yo woman with PMH of Crohn's disease, GERD, hypertension, and previous takotsubo syndrome (2007 LHC with clean coronary arteries) who presents with chest discomfort that began 6:30 pm yesterday evening after a stressful, emotional phone call. She tried to go to sleep around 10-11 but couldn't really get any sleep (pain decreased from 8/10 to 4-5/10) ultimately leading to ER presentation. The pain is very similar to 2007 when she had takotsubo's. She also had neck/jaw discomfort. Her symptoms were not improved with SL NTG but improved with fentanyl. She was found to have an elevated troponin of 1.9 in the ER and she received heparin gtt and aspirin in the ER. We discussed that this is 9 years later and could be recurrence but new plaque rupture. She is accompanied by her husband. No recent travel. She is very active in cardiac rehabilitation and exercise.   Past Medical History  Diagnosis Date  . Takotsubo cardiomyopathy 2007  . Raynaud's disease   . Mitral valve prolapse   . Crohn's disease   . Deep vein thrombosis     pt denies: 04/08/13  . Telangiectasia   . Hyperthyroidism     pt denies:  04/08/13  . GERD (gastroesophageal reflux disease)   . Myocardial infarction   . Migraines     occular    Past Surgical History  Procedure Laterality Date  . Shoulder surgery  1996    right  . Tubal ligation  1973  . Appendectomy  1971  . Colonoscopy    . Colonoscopy  05/11/2012    Procedure: COLONOSCOPY;  Surgeon: Lafayette Dragon, MD;  Location: [facility];  Service: Endoscopy;  Laterality: N/A;    Family History  Problem Relation Age of Onset  . Aortic stenosis Father   . Hypertension Father   . Colon cancer Neg Hx   . Diabetes Sister   . Hyperthyroidism Sister   . Diabetes Sister   . Hyperthyroidism Sister   . Hypertension Mother   . Hyperthyroidism Sister   .  Hyperthyroidism Sister    Social History:  reports that she has never smoked. She has never used smokeless tobacco. She reports that she drinks about 4.2 oz of alcohol per week. She reports that she does not use illicit drugs.  Allergies:  Allergies  Allergen Reactions  . Cafergot [Ergotamine-Caffeine] Other (See Comments)    Vagal paralysis  . Compazine [Prochlorperazine Edisylate] Other (See Comments)    paralysis of face  . Prochlorperazine Edisylate Other (See Comments)    REACTION: extrapyrimadal syndrome  . Tetanus-Diphtheria Toxoids Td Swelling    REACTION: arm swells     (Not in a hospital admission)  Results for orders placed or performed during the hospital encounter of 01/31/15 (from the past 48 hour(s))  CBC with Differential/Platelet     Status: Abnormal   Collection Time: 01/31/15  4:02 AM  Result Value Ref Range   WBC 11.3 (H) 4.0 - 10.5 K/uL   RBC 4.36 3.87 - 5.11 MIL/uL   Hemoglobin 13.1 12.0 - 15.0 g/dL   HCT 39.8 36.0 - 46.0 %   MCV 91.3 78.0 - 100.0 fL   MCH 30.0 26.0 - 34.0 pg   MCHC 32.9 30.0 - 36.0 g/dL   RDW 13.1 11.5 - 15.5 %   Platelets 256 150 - 400 K/uL   Neutrophils Relative % 86 (H)  43 - 77 %   Neutro Abs 9.7 (H) 1.7 - 7.7 K/uL   Lymphocytes Relative 9 (L) 12 - 46 %   Lymphs Abs 1.0 0.7 - 4.0 K/uL   Monocytes Relative 4 3 - 12 %   Monocytes Absolute 0.5 0.1 - 1.0 K/uL   Eosinophils Relative 1 0 - 5 %   Eosinophils Absolute 0.1 0.0 - 0.7 K/uL   Basophils Relative 0 0 - 1 %   Basophils Absolute 0.1 0.0 - 0.1 K/uL  Lipase, blood     Status: Abnormal   Collection Time: 01/31/15  4:02 AM  Result Value Ref Range   Lipase 17 (L) 22 - 51 U/L  I-stat troponin, ED     Status: Abnormal   Collection Time: 01/31/15  4:06 AM  Result Value Ref Range   Troponin i, poc 1.92 (HH) 0.00 - 0.08 ng/mL   Comment NOTIFIED PHYSICIAN    Comment 3            Comment: Due to the release kinetics of cTnI, a negative result within the first hours of the onset of  symptoms does not rule out myocardial infarction with certainty. If myocardial infarction is still suspected, repeat the test at appropriate intervals.   I-stat chem 8, ed     Status: Abnormal   Collection Time: 01/31/15  4:08 AM  Result Value Ref Range   Sodium 135 135 - 145 mmol/L   Potassium 4.2 3.5 - 5.1 mmol/L   Chloride 98 (L) 101 - 111 mmol/L   BUN 16 6 - 20 mg/dL   Creatinine, Ser 0.80 0.44 - 1.00 mg/dL   Glucose, Bld 152 (H) 65 - 99 mg/dL   Calcium, Ion 1.09 (L) 1.13 - 1.30 mmol/L   TCO2 24 0 - 100 mmol/L   Hemoglobin 14.6 12.0 - 15.0 g/dL   HCT 43.0 36.0 - 46.0 %   *Note: Due to a large number of results and/or encounters for the requested time period, some results have not been displayed. A complete set of results can be found in Results Review.   Dg Chest 2 View  01/31/2015   CLINICAL DATA:  Chest pain and nausea and vomiting tonight.  EXAM: CHEST  2 VIEW  FINDINGS: Heart size and pulmonary vascularity are normal. Emphysematous changes in the lungs. Bilateral perihilar infiltration. Mild bronchiectasis with interstitial changes in the lung bases. Bronchiectasis. No focal airspace disease or consolidation. No blunting of costophrenic angles. No pneumothorax. Pectus excavatum. Mediastinal contours appear intact.  IMPRESSION: Emphysematous changes with chronic fibrosis and central bronchiectasis. Infiltration in the perihilar regions may represent early pneumonia.   Electronically Signed   By: Lucienne Capers M.D.   On: 01/31/2015 04:21    Review of Systems  Constitutional: Negative for fever, chills and weight loss.  HENT: Negative for ear pain.   Eyes: Negative for double vision and photophobia.  Respiratory: Negative for cough, sputum production and shortness of breath.   Cardiovascular: Positive for chest pain. Negative for palpitations and orthopnea.  Gastrointestinal: Positive for nausea. Negative for vomiting.  Genitourinary: Negative for dysuria and hematuria.    Musculoskeletal: Negative for myalgias.  Skin: Negative for rash.  Neurological: Negative for dizziness, tingling, tremors and headaches.  Endo/Heme/Allergies: Negative for polydipsia. Does not bruise/bleed easily.  Psychiatric/Behavioral: Negative for depression, suicidal ideas and substance abuse.    Blood pressure 106/75, pulse 96, temperature 97.6 F (36.4 C), temperature source Oral, resp. rate 16, height 5' 7.5" (1.715 m), weight 53.071  kg (117 lb), SpO2 87 %. Physical Exam  Nursing note and vitals reviewed. Constitutional: She is oriented to person, place, and time. She appears well-developed and well-nourished. No distress.  HENT:  Head: Normocephalic and atraumatic.  Nose: Nose normal.  Mouth/Throat: Oropharynx is clear and moist. No oropharyngeal exudate.  Eyes: Conjunctivae and EOM are normal. Pupils are equal, round, and reactive to light. No scleral icterus.  Neck: Normal range of motion. Neck supple. No JVD present. No tracheal deviation present.  Cardiovascular: Normal rate, regular rhythm, normal heart sounds and intact distal pulses.  Exam reveals no gallop.   No murmur heard. Respiratory: Effort normal and breath sounds normal. No respiratory distress. She has no wheezes. She has no rales.  GI: Soft. Bowel sounds are normal. She exhibits no distension. There is no tenderness. There is no rebound.  Musculoskeletal: Normal range of motion. She exhibits no edema or tenderness.  Neurological: She is alert and oriented to person, place, and time. No cranial nerve deficit. Coordination normal.  Skin: Skin is warm and dry. No rash noted. She is not diaphoretic. No erythema.  Psychiatric: She has a normal mood and affect. Her behavior is normal. Thought content normal.    Labs reviewed; trop 1.92, Cr 0.8, h/h 13/40, plt 100s, wbc 11.3 12/14 Echo: EF 60-65%, mild MR EKG: RSR', sinus tachycardia Chest x-ray reviewed; perihilar fullness Assessment/Plan Ms. 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 She was found to have an elevated troponin of 1.9. Differential diagnosis for elevated troponin includes demand ischemia from other stress event (sepsis), neurologic disease, takotsubo syndrome, myocarditis, acute renal failure, among other etiologies. I favor a diagnosis of NSTEMI; however, cannot exclude other etiologies. For now, continue aspirin, heparin gtt, obtain echocardiogram and plan for likely left heart catheterization this AM.  Problem List 1. Chest pain/NSTEMI 2. Crohn's Disease 3. Hypertension 4. GERD  1. NPO after MN for LHC in AM. I discussed this plan with the patient.  2. trend cardiac markers, observation on telemetry, admit to telemetry 3. plan for LHC in AM; if symptoms change, low threshold to activate cath lab 4. asa 81 mg, heparin gtt, metoprolol 12.5 mg bid  5. atorvastatin 80 mg qHS first dose now 6. hba1c, tsh, lipid panel, BNP 7. Echocardiogram in AM 8. Continue home GERD medications Shiela Bruns 01/31/2015, 5:48 AM

## 2015-01-31 NOTE — ED Notes (Signed)
Report called to Amanda, RN on [address].

## 2015-01-31 NOTE — ED Notes (Signed)
Patient reports that she had been arguing on the phone this evening, became very upset, and began experiencing central chest pain which radiated to her neck/jaw. Reports pain as 9/10 at that time. Patient went to bed. Unable to sleep d/t chest pain. At approx 0215, she also experienced diaphoresis, nausea, vomiting x 2. No LOC. Called 911. Instructed by dispatcher to take 4 additional ASA 81 mg tablets (she had taken 3 ASA 81 mg earlier in the day). Patient given NTG x 1, and Fentanyl 50 mcg x 2 prior to arrival at ED. Patient presents with report that chest pain is relieved. Denies chest pain at this time. Patient reports that she had Non-Stemi MI in 2007 with same symptoms presenting at that time.

## 2015-01-31 NOTE — ED Notes (Signed)
Jeannine Boga, RN, on [place], to update on administration of NTG, Fentanyl and initiation of new line in R arm. Chest pain improved.

## 2015-01-31 NOTE — Progress Notes (Addendum)
PROGRESS NOTE  Subjective:   69 yo with hx of Takotsubo syndrome in 2007. Now presents with similar symptoms after a stressful phone cal . Troponin is elevated   scheduled for cath this am   Has Raynauds .  Is concerned that she may need to be on beta blockers.    Objective:    Vital Signs:   Temp:  [97.6 F (36.4 C)-97.7 F (36.5 C)] 97.7 F (36.5 C) (07/13 0800) Pulse Rate:  [83-110] 93 (07/13 0650) Resp:  [11-25] 25 (07/13 0800) BP: (96-134)/(62-90) 101/62 mmHg (07/13 0800) SpO2:  [87 %-97 %] 96 % (07/13 0800) Weight:  [51.12 kg (112 lb 11.2 oz)-53.071 kg (117 lb)] 51.12 kg (112 lb 11.2 oz) (07/13 0800)  Last BM Date: 01/30/15   24-hour weight change: Weight change:   Weight trends: Filed Weights   01/31/15 0357 01/31/15 0800  Weight: 53.071 kg (117 lb) 51.12 kg (112 lb 11.2 oz)    Intake/Output:        Physical Exam: BP 101/62 mmHg  Pulse 93  Temp(Src) 97.7 F (36.5 C) (Oral)  Resp 25  Ht 5' 7.5" (1.715 m)  Wt 51.12 kg (112 lb 11.2 oz)  BMI 17.38 kg/m2  SpO2 96%  Wt Readings from Last 3 Encounters:  01/31/15 51.12 kg (112 lb 11.2 oz)  10/06/14 53.162 kg (117 lb 3.2 oz)  08/14/14 53.797 kg (118 lb 9.6 oz)    General: Vital signs reviewed and noted.  Thin female   Head: Normocephalic, atraumatic.  Eyes: conjunctivae/corneas clear.  EOM's intact.   Throat: normal  Neck:  normal   Lungs:    clear   Heart:  RR , no rub   Abdomen:  Soft, non-tender, non-distended    Extremities: Good pulses    Neurologic: A&O X3, CN II - XII are grossly intact.   Psych: Normal     Labs: BMET:  Recent Labs  01/31/15 0408  NA 135  K 4.2  CL 98*  GLUCOSE 152*  BUN 16  CREATININE 0.80    Liver function tests: No results for input(s): AST, ALT, ALKPHOS, BILITOT, PROT, ALBUMIN in the last 72 hours.  Recent Labs  01/31/15 0402  LIPASE 17*    CBC:  Recent Labs  01/31/15 0402 01/31/15 0408  WBC 11.3*  --   NEUTROABS 9.7*  --   HGB 13.1  14.6  HCT 39.8 43.0  MCV 91.3  --   PLT 256  --     Cardiac Enzymes: No results for input(s): CKTOTAL, CKMB, TROPONINI in the last 72 hours.  Coagulation Studies: No results for input(s): LABPROT, INR in the last 72 hours.  Other: Invalid input(s): POCBNP No results for input(s): DDIMER in the last 72 hours. No results for input(s): HGBA1C in the last 72 hours. No results for input(s): CHOL, HDL, LDLCALC, TRIG, CHOLHDL in the last 72 hours. No results for input(s): TSH, T4TOTAL, T3FREE, THYROIDAB in the last 72 hours.  Invalid input(s): FREET3 No results for input(s): VITAMINB12, FOLATE, FERRITIN, TIBC, IRON, RETICCTPCT in the last 72 hours.   Other results:  EKG  ( personally reviewed )  -  NSR , TWI in the ant. Lat leads ( new from previous tracing )   Medications:    Infusions: . heparin 650 Units/hr (01/31/15 0558)    Scheduled Medications: . acidophilus  1 capsule Oral Daily  . aspirin  81 mg Oral Daily  . atorvastatin  80 mg Oral q1800  .  balsalazide  2,250 mg Oral TID  . calcium-vitamin D  1 tablet Oral BID  . fluticasone  2 spray Each Nare Daily  . metoprolol tartrate  12.5 mg Oral BID  . sodium chloride  3 mL Intravenous Q12H  . vitamin C  500 mg Oral Daily    Assessment/ Plan:   Principal Problem:   NSTEMI (non-ST elevated myocardial infarction) Active Problems:   GERD   Hypertension   Crohn's disease  1. NSTEMI:  Associated with TWI in the anterior lateral leads and mild Troponin elevation .  She may have recurrent stress induced coronary spasm but we cannot rule out a plaque rupture.  Hx of Raynauds .  Ive reassured her that we should be able to use a low dose of Carvedilol without causing any symptoms ( weather is also hot which will make it easier )   For cath today  Echo today    2. Essential HTN:  BP is ok   Disposition:  Length of Stay: 0  Thayer Headings, Brooke Bonito., MD, [facility] 01/31/2015, 9:24 AM Office [phone] Pager [phone]

## 2015-01-31 NOTE — H&P (View-Only) (Signed)
PROGRESS NOTE  Subjective:   70 yo with hx of Takotsubo syndrome in 2007. Now presents with similar symptoms after a stressful phone cal . Troponin is elevated   scheduled for cath this am   Has Raynauds .  Is concerned that she may need to be on beta blockers.    Objective:    Vital Signs:   Temp:  [97.6 F (36.4 C)-97.7 F (36.5 C)] 97.7 F (36.5 C) (07/13 0800) Pulse Rate:  [83-110] 93 (07/13 0650) Resp:  [11-25] 25 (07/13 0800) BP: (96-134)/(62-90) 101/62 mmHg (07/13 0800) SpO2:  [87 %-97 %] 96 % (07/13 0800) Weight:  [51.12 kg (112 lb 11.2 oz)-53.071 kg (117 lb)] 51.12 kg (112 lb 11.2 oz) (07/13 0800)  Last BM Date: 01/30/15   24-hour weight change: Weight change:   Weight trends: Filed Weights   01/31/15 0357 01/31/15 0800  Weight: 53.071 kg (117 lb) 51.12 kg (112 lb 11.2 oz)    Intake/Output:        Physical Exam: BP 101/62 mmHg  Pulse 93  Temp(Src) 97.7 F (36.5 C) (Oral)  Resp 25  Ht 5' 7.5" (1.715 m)  Wt 51.12 kg (112 lb 11.2 oz)  BMI 17.38 kg/m2  SpO2 96%  Wt Readings from Last 3 Encounters:  01/31/15 51.12 kg (112 lb 11.2 oz)  10/06/14 53.162 kg (117 lb 3.2 oz)  08/14/14 53.797 kg (118 lb 9.6 oz)    General: Vital signs reviewed and noted.  Thin female   Head: Normocephalic, atraumatic.  Eyes: conjunctivae/corneas clear.  EOM's intact.   Throat: normal  Neck:  normal   Lungs:    clear   Heart:  RR , no rub   Abdomen:  Soft, non-tender, non-distended    Extremities: Good pulses    Neurologic: A&O X3, CN II - XII are grossly intact.   Psych: Normal     Labs: BMET:  Recent Labs  01/31/15 0408  NA 135  K 4.2  CL 98*  GLUCOSE 152*  BUN 16  CREATININE 0.80    Liver function tests: No results for input(s): AST, ALT, ALKPHOS, BILITOT, PROT, ALBUMIN in the last 72 hours.  Recent Labs  01/31/15 0402  LIPASE 17*    CBC:  Recent Labs  01/31/15 0402 01/31/15 0408  WBC 11.3*  --   NEUTROABS 9.7*  --   HGB 13.1  14.6  HCT 39.8 43.0  MCV 91.3  --   PLT 256  --     Cardiac Enzymes: No results for input(s): CKTOTAL, CKMB, TROPONINI in the last 72 hours.  Coagulation Studies: No results for input(s): LABPROT, INR in the last 72 hours.  Other: Invalid input(s): POCBNP No results for input(s): DDIMER in the last 72 hours. No results for input(s): HGBA1C in the last 72 hours. No results for input(s): CHOL, HDL, LDLCALC, TRIG, CHOLHDL in the last 72 hours. No results for input(s): TSH, T4TOTAL, T3FREE, THYROIDAB in the last 72 hours.  Invalid input(s): FREET3 No results for input(s): VITAMINB12, FOLATE, FERRITIN, TIBC, IRON, RETICCTPCT in the last 72 hours.   Other results:  EKG  ( personally reviewed )  -  NSR , TWI in the ant. Lat leads ( new from previous tracing )   Medications:    Infusions: . heparin 650 Units/hr (01/31/15 0558)    Scheduled Medications: . acidophilus  1 capsule Oral Daily  . aspirin  81 mg Oral Daily  . atorvastatin  80 mg Oral q1800  .  balsalazide  2,250 mg Oral TID  . calcium-vitamin D  1 tablet Oral BID  . fluticasone  2 spray Each Nare Daily  . metoprolol tartrate  12.5 mg Oral BID  . sodium chloride  3 mL Intravenous Q12H  . vitamin C  500 mg Oral Daily    Assessment/ Plan:   Principal Problem:   NSTEMI (non-ST elevated myocardial infarction) Active Problems:   GERD   Hypertension   Crohn's disease  1. NSTEMI:  Associated with TWI in the anterior lateral leads and mild Troponin elevation .  She may have recurrent stress induced coronary spasm but we cannot rule out a plaque rupture.  Hx of Raynauds .  Ive reassured her that we should be able to use a low dose of Carvedilol without causing any symptoms ( weather is also hot which will make it easier )   For cath today  Echo today    2. Essential HTN:  BP is ok   Disposition:  Length of Stay: 0  Thayer Headings, Brooke Bonito., MD, [facility] 01/31/2015, 9:24 AM Office [phone] Pager [phone]

## 2015-01-31 NOTE — Interval H&P Note (Signed)
Cath Lab Visit (complete for each Cath Lab visit)  Clinical Evaluation Leading to the Procedure:   ACS: Yes.    Non-ACS:    Anginal Classification: CCS IV  Anti-ischemic medical therapy: Minimal Therapy (1 class of medications)  Non-Invasive Test Results: No non-invasive testing performed  Prior CABG: No previous CABG  TIMI Score  Patient Information:  TIMI Score is 4   UA/NSTEMI and intermediate-risk features (e.g., TIMI score 3-4) for short-term risk of death or nonfatal MI  Revascularization of the presumed culprit artery   A (8)  Indication: 10; Score: 8    History and Physical Interval Note:  01/31/2015 2:08 PM  Kirsten Rice  has presented today for surgery, with the diagnosis of non stemi  The various methods of treatment have been discussed with the patient and family. After consideration of risks, benefits and other options for treatment, the patient has consented to  Procedure(s): Left Heart Cath and Coronary Angiography (N/A) as a surgical intervention .  The patient's history has been reviewed, patient examined, no change in status, stable for surgery.  I have reviewed the patient's chart and labs.  Questions were answered to the patient's satisfaction.     VARANASI,JAYADEEP S.

## 2015-01-31 NOTE — ED Notes (Signed)
Patient transported to X-ray 

## 2015-01-31 NOTE — ED Notes (Signed)
Dr. Claiborne Billings, cardiologist, in room with patient and spouse.

## 2015-01-31 NOTE — ED Provider Notes (Signed)
CSN: [id number]   Arrival date & time 01/31/15 0327  History  This chart was scribed for  Dominyck Reser, MD by Altamease Oiler, ED Scribe. This patient was seen in room B16C/B16C and the patient's care was started at 3:39 AM.  Chief Complaint  Patient presents with  . Chest Pain    HPI Patient is a 69 y.o. female presenting with chest pain. The history is provided by the patient. No language interpreter was used.  Chest Pain Pain location:  Substernal area (Mid chest) Pain quality: dull   Pain radiates to:  Does not radiate Pain radiates to the back: no   Pain severity:  Severe Onset quality:  Sudden Duration:  10 hours Timing:  Constant Progression:  Resolved Chronicity:  Recurrent Context: stress   Relieved by:  Nothing Worsened by:  Nothing tried Ineffective treatments:  None tried Associated symptoms: nausea and vomiting   Associated symptoms: no claudication, no diaphoresis, no orthopnea and no palpitations   Risk factors: not female    Kirsten Rice is a 69 y.o. female with PMHx of Takotsubo cardiomyopathy and MI who presents to the Emergency Department complaining of constant chest pain with onset around 6:30 PM yesterday during an emotional upset. The pain is located in the center of the chest. Associated nausea, 2 episodes of emesis, and a "hacking" cough. She denies abdominal pain and diarrhea .Last used prednisone 1 year ago.  Pt did not eat before the episode. No tobacco use+. She drinks 1 glass of wine daily.   Past Medical History  Diagnosis Date  . Takotsubo cardiomyopathy 2007  . Raynaud's disease   . Mitral valve prolapse   . Crohn's disease   . Deep vein thrombosis     pt denies: 04/08/13  . Telangiectasia   . Hyperthyroidism     pt denies:  04/08/13  . GERD (gastroesophageal reflux disease)   . Myocardial infarction   . Migraines     occular    Past Surgical History  Procedure Laterality Date  . Shoulder surgery  1996    right  . Tubal ligation   1973  . Appendectomy  1971  . Colonoscopy    . Colonoscopy  05/11/2012    Procedure: COLONOSCOPY;  Surgeon: Lafayette Dragon, MD;  Location: [facility];  Service: Endoscopy;  Laterality: N/A;    Family History  Problem Relation Age of Onset  . Aortic stenosis Father   . Hypertension Father   . Colon cancer Neg Hx   . Diabetes Sister   . Hyperthyroidism Sister   . Diabetes Sister   . Hyperthyroidism Sister   . Hypertension Mother   . Hyperthyroidism Sister   . Hyperthyroidism Sister     History  Substance Use Topics  . Smoking status: Never Smoker   . Smokeless tobacco: Never Used  . Alcohol Use: 4.2 oz/week    7 Glasses of wine per week     Comment: glass of wine daily      Review of Systems  Constitutional: Negative for diaphoresis.  Cardiovascular: Positive for chest pain. Negative for palpitations, orthopnea, claudication and leg swelling.  Gastrointestinal: Positive for nausea and vomiting.  All other systems reviewed and are negative.   Home Medications   Prior to Admission medications   Medication Sig Start Date End Date Taking? Authorizing Provider  aspirin 81 MG tablet Take 81 mg by mouth daily.      Historical Provider, MD  balsalazide (COLAZAL) 750 MG capsule Take 2,250  mg by mouth 3 (three) times daily.  07/27/13   Lafayette Dragon, MD  benazepril (LOTENSIN) 10 MG tablet Take 0.5 tablets (5 mg total) by mouth daily. 06/09/14   Sherren Mocha, MD  bifidobacterium infantis (ALIGN) capsule Take 1 capsule by mouth daily.    Historical Provider, MD  Biotin 2500 MCG CAPS Take 2,500 mcg by mouth daily.     Historical Provider, MD  Calcium-Vitamin D 600-200 MG-UNIT per tablet Take 2 tablets by mouth daily. 02/01/13   Lafayette Dragon, MD  fluticasone (FLONASE) 50 MCG/ACT nasal spray USE 2 SPRAYS IN [facility] NOSTRIL DAILY 11/06/14   Brand Males, MD  furosemide (LASIX) 20 MG tablet Take 1 tablet (20 mg total) by mouth daily. 06/09/14   Sherren Mocha, MD  potassium chloride SA  (K-DUR,KLOR-CON) 20 MEQ tablet Take 1 tablet (20 mEq total) by mouth daily. 06/09/14   Sherren Mocha, MD  PRESCRIPTION MEDICATION Pt uses prescription cream for rosasea    Historical Provider, MD  vitamin C (ASCORBIC ACID) 500 MG tablet Take 500 mg by mouth daily.      Historical Provider, MD    Allergies  Cafergot; Compazine; Prochlorperazine edisylate; and Tetanus-diphtheria toxoids td  Triage Vitals: BP 112/75 mmHg  Pulse 106  Temp(Src) 97.6 F (36.4 C) (Oral)  Resp 16  SpO2 93%  Physical Exam  Constitutional: She is oriented to person, place, and time. She appears well-developed and well-nourished. No distress.  HENT:  Head: Normocephalic and atraumatic.  Mouth/Throat: Oropharynx is clear and moist.  Eyes: Conjunctivae and EOM are normal. Pupils are equal, round, and reactive to light.  Neck: Normal range of motion. Neck supple. No tracheal deviation present.  Cardiovascular: Normal rate and regular rhythm.   Pulmonary/Chest: Effort normal. No respiratory distress. She has decreased breath sounds. She has no wheezes. She has no rales.  Diminished breath sounds bilaterally  Abdominal: Soft. Bowel sounds are increased. There is no tenderness. There is no rebound and no guarding.  Hyperactive bowel sounds most pronounced in the epigastrium  Musculoskeletal: Normal range of motion. She exhibits no edema.  Neurological: She is alert and oriented to person, place, and time.  Skin: Skin is warm and dry.  Psychiatric: She has a normal mood and affect. Her behavior is normal.  Nursing note and vitals reviewed.   ED Course  Procedures   DIAGNOSTIC STUDIES: Oxygen Saturation is 93% on RA, normal by my interpretation.    COORDINATION OF CARE: 3:44 AM Discussed treatment plan which includes EKG with pt at bedside and pt agreed to plan.  Labs Review- Labs Reviewed - No data to display  Imaging Review No results found.  EKG Interpretation  Date/Time:  Wednesday January 31 2015  03:34:44 EDT Ventricular Rate:  111 PR Interval:  145 QRS Duration: 83 QT Interval:  339 QTC Calculation: 461 R Axis:   32 Text Interpretation:  Sinus tachycardia RSR' in V1 or V2, probably normal variant Consider anterior infarct Confirmed by [facility]  MD, Jannetta Massey ([id number]) on 01/31/2015 4:39:32 AM          MDM   Final diagnoses:  None   Results for orders placed or performed during the hospital encounter of 01/31/15  CBC with Differential/Platelet  Result Value Ref Range   WBC 11.3 (H) 4.0 - 10.5 K/uL   RBC 4.36 3.87 - 5.11 MIL/uL   Hemoglobin 13.1 12.0 - 15.0 g/dL   HCT 39.8 36.0 - 46.0 %   MCV 91.3 78.0 - 100.0 fL  MCH 30.0 26.0 - 34.0 pg   MCHC 32.9 30.0 - 36.0 g/dL   RDW 13.1 11.5 - 15.5 %   Platelets 256 150 - 400 K/uL   Neutrophils Relative % 86 (H) 43 - 77 %   Neutro Abs 9.7 (H) 1.7 - 7.7 K/uL   Lymphocytes Relative 9 (L) 12 - 46 %   Lymphs Abs 1.0 0.7 - 4.0 K/uL   Monocytes Relative 4 3 - 12 %   Monocytes Absolute 0.5 0.1 - 1.0 K/uL   Eosinophils Relative 1 0 - 5 %   Eosinophils Absolute 0.1 0.0 - 0.7 K/uL   Basophils Relative 0 0 - 1 %   Basophils Absolute 0.1 0.0 - 0.1 K/uL  Lipase, blood  Result Value Ref Range   Lipase 17 (L) 22 - 51 U/L  I-stat chem 8, ed  Result Value Ref Range   Sodium 135 135 - 145 mmol/L   Potassium 4.2 3.5 - 5.1 mmol/L   Chloride 98 (L) 101 - 111 mmol/L   BUN 16 6 - 20 mg/dL   Creatinine, Ser 0.80 0.44 - 1.00 mg/dL   Glucose, Bld 152 (H) 65 - 99 mg/dL   Calcium, Ion 1.09 (L) 1.13 - 1.30 mmol/L   TCO2 24 0 - 100 mmol/L   Hemoglobin 14.6 12.0 - 15.0 g/dL   HCT 43.0 36.0 - 46.0 %  I-stat troponin, ED  Result Value Ref Range   Troponin i, poc 1.92 (HH) 0.00 - 0.08 ng/mL   Comment NOTIFIED PHYSICIAN    Comment 3           *Note: Due to a large number of results and/or encounters for the requested time period, some results have not been displayed. A complete set of results can be found in Results Review.   Dg Chest 2  View  01/31/2015   CLINICAL DATA:  Chest pain and nausea and vomiting tonight.  EXAM: CHEST  2 VIEW  FINDINGS: Heart size and pulmonary vascularity are normal. Emphysematous changes in the lungs. Bilateral perihilar infiltration. Mild bronchiectasis with interstitial changes in the lung bases. Bronchiectasis. No focal airspace disease or consolidation. No blunting of costophrenic angles. No pneumothorax. Pectus excavatum. Mediastinal contours appear intact.  IMPRESSION: Emphysematous changes with chronic fibrosis and central bronchiectasis. Infiltration in the perihilar regions may represent early pneumonia.   Electronically Signed   By: Lucienne Capers M.D.   On: 01/31/2015 04:21    Medications  nitroGLYCERIN (NITROGLYN) 2 % ointment 0.5 inch (not administered)   Medications  aspirin chewable tablet 324 mg (324 mg Oral Not Given 01/31/15 0529)  heparin ADULT infusion 100 units/mL (25000 units/250 mL) (650 Units/hr Intravenous New Bag/Given 01/31/15 0558)  nitroGLYCERIN (NITROGLYN) 2 % ointment 0.5 inch (0.5 inches Topical Given 01/31/15 0512)  ondansetron (ZOFRAN) injection 4 mg (4 mg Intravenous Given 01/31/15 0507)  heparin bolus via infusion 3,000 Units (3,000 Units Intravenous Given 01/31/15 0558)     I personally performed the services described in this documentation, which was scribed in my presence. The recorded information has been reviewed and is accurate.     Veatrice Kells, MD 01/31/15 [id number]

## 2015-01-31 NOTE — Progress Notes (Signed)
Utilization review completed.  

## 2015-01-31 NOTE — ED Notes (Addendum)
Patient arrived via [facility]. Report from EMS: Patient arguing on phone approx 6 pm this evening. Very upset. Began experiencing chest pain radiating to neck/jaw. Reported pain as 9/10 on 0/10 pain scale. Patient had experienced non-stemi MI in 2007, and reports same symptoms. Also, experienced diaphoresis/nausea/vomiting x 2. Patient called EMS. Patient had taken 3 Aspirin 81 mg earlier during the day, and was instructed to take 4 additional Aspirin 81 mg by 911 dispatcher (total 7). EMS administered NTG 0.4 mg x 1 at 0308. Pain 9/10. Fentanyl 50 mg administered at 0318. Pain 7/10. Fentanyl 50 mg administered at 0325. Pain 2/10. Patient denies chest pain upon arrival to ER room.

## 2015-01-31 NOTE — Progress Notes (Signed)
ANTICOAGULATION CONSULT NOTE - Initial Consult  Pharmacy Consult for Heparin Indication: chest pain/ACS  Allergies  Allergen Reactions  . Cafergot [Ergotamine-Caffeine] Other (See Comments)    Vagal paralysis  . Compazine [Prochlorperazine Edisylate] Other (See Comments)    paralysis of face  . Prochlorperazine Edisylate Other (See Comments)    REACTION: extrapyrimadal syndrome  . Tetanus-Diphtheria Toxoids Td Swelling    REACTION: arm swells    Patient Measurements: Height: 5' 7.5" (171.5 cm) Weight: 117 lb (53.071 kg) IBW/kg (Calculated) : 62.75 Heparin Dosing Weight: 53 kg  Vital Signs: Temp: 97.6 F (36.4 C) (07/13 0339) Temp Source: Oral (07/13 0339) BP: 106/75 mmHg (07/13 0515) Pulse Rate: 96 (07/13 0515)  Labs:  Recent Labs  01/31/15 0402 01/31/15 0408  HGB 13.1 14.6  HCT 39.8 43.0  PLT 256  --   CREATININE  --  0.80    Estimated Creatinine Clearance: 54.1 mL/min (by C-G formula based on Cr of 0.8).   Medical History: Past Medical History  Diagnosis Date  . Takotsubo cardiomyopathy 2007  . Raynaud's disease   . Mitral valve prolapse   . Crohn's disease   . Deep vein thrombosis     pt denies: 04/08/13  . Telangiectasia   . Hyperthyroidism     pt denies:  04/08/13  . GERD (gastroesophageal reflux disease)   . Myocardial infarction   . Migraines     occular    Medications:  Awaiting med rec  Assessment: 69 y.o. female presents with CP. To begin heparin for r/o ACS. CBC stable at baseline.  Goal of Therapy:  Heparin level 0.3-0.7 units/ml Monitor platelets by anticoagulation protocol: Yes   Plan:  Heparin IV bolus 3000 units Heparin gtt at 650 units/hr Will f/u 8 hr heparin level Daily heparin level and CBC  Sherlon Handing, PharmD, BCPS Clinical pharmacist, pager [phone] 01/31/2015,5:23 AM

## 2015-02-01 ENCOUNTER — Ambulatory Visit (HOSPITAL_COMMUNITY): Payer: Medicare Other

## 2015-02-01 ENCOUNTER — Encounter (HOSPITAL_COMMUNITY): Admission: RE | Admit: 2015-02-01 | Payer: Self-pay | Source: Ambulatory Visit

## 2015-02-01 DIAGNOSIS — R079 Chest pain, unspecified: Secondary | ICD-10-CM

## 2015-02-01 LAB — BASIC METABOLIC PANEL
Anion gap: 9 (ref 5–15)
BUN: 15 mg/dL (ref 6–20)
CO2: 26 mmol/L (ref 22–32)
Calcium: 8.8 mg/dL — ABNORMAL LOW (ref 8.9–10.3)
Chloride: 98 mmol/L — ABNORMAL LOW (ref 101–111)
Creatinine, Ser: 1.01 mg/dL — ABNORMAL HIGH (ref 0.44–1.00)
GFR calc non Af Amer: 55 mL/min — ABNORMAL LOW (ref >60)
GLUCOSE: 152 mg/dL — AB (ref 65–99)
Potassium: 4.2 mmol/L (ref 3.5–5.1)
SODIUM: 133 mmol/L — AB (ref 135–145)

## 2015-02-01 LAB — CBC
HCT: 36.7 % (ref 36.0–46.0)
HEMOGLOBIN: 12 g/dL (ref 12.0–15.0)
MCH: 30.2 pg (ref 26.0–34.0)
MCHC: 32.7 g/dL (ref 30.0–36.0)
MCV: 92.2 fL (ref 78.0–100.0)
PLATELETS: 237 10*3/uL (ref 150–400)
RBC: 3.98 MIL/uL (ref 3.87–5.11)
RDW: 13.4 % (ref 11.5–15.5)
WBC: 11.7 10*3/uL — ABNORMAL HIGH (ref 4.0–10.5)

## 2015-02-01 LAB — LIPID PANEL
Cholesterol: 158 mg/dL (ref 0–200)
HDL: 66 mg/dL (ref >40)
LDL Cholesterol: 79 mg/dL (ref 0–99)
Total CHOL/HDL Ratio: 2.4 RATIO
Triglycerides: 65 mg/dL (ref <150)
VLDL: 13 mg/dL (ref 0–40)

## 2015-02-01 LAB — HEMOGLOBIN A1C
Hgb A1c MFr Bld: 5.8 % — ABNORMAL HIGH (ref 4.8–5.6)
Mean Plasma Glucose: 120 mg/dL

## 2015-02-01 MED ORDER — PROMETHAZINE HCL 25 MG/ML IJ SOLN
12.5000 mg | Freq: Four times a day (QID) | INTRAMUSCULAR | Status: DC | PRN
  Administered 2015-02-01: 12.5 mg via INTRAVENOUS
  Filled 2015-02-01: qty 1

## 2015-02-01 MED ORDER — CARVEDILOL 6.25 MG PO TABS
6.2500 mg | ORAL_TABLET | Freq: Two times a day (BID) | ORAL | Status: DC
  Administered 2015-02-01 – 2015-02-02 (×2): 6.25 mg via ORAL
  Filled 2015-02-01 (×2): qty 1

## 2015-02-01 MED FILL — Lidocaine HCl Local Preservative Free (PF) Inj 1%: INTRAMUSCULAR | Qty: 30 | Status: AC

## 2015-02-01 MED FILL — Heparin Sodium (Porcine) 2 Unit/ML in Sodium Chloride 0.9%: INTRAMUSCULAR | Qty: 1500 | Status: AC

## 2015-02-01 MED FILL — Nitroglycerin IV Soln 100 MCG/ML in D5W: INTRA_ARTERIAL | Qty: 10 | Status: AC

## 2015-02-01 NOTE — Progress Notes (Signed)
CARDIAC REHAB PHASE I   PRE:  Rate/Rhythm: 102 ST  BP:  Sitting: 105/67        SaO2: 97 RA  MODE:  Ambulation: 240 ft   POST:  Rate/Rhythm: 105 sT  BP:  Sitting: 112/87         SaO2: 96 RA  Pt ambulated 240 ft on RA, handheld assist, steady gait, tolerated fair.  Pt c/o of generalized weakness, decreased activity tolerance, denies DOE, cp, dizziness, declined rest stop. VSS. Completed MI education with husband and daughter at bedside. Reviewed risk factors, takotsubos, activity restrictions,exercise, heart healthy diet, daily weights, sodium and fluid restrictions and phase 2 cardiac rehab. Pt verbalized understanding. Pt is familiar with takotsubos as this is not her first event. 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 Pt is currently active in the cardiac rehab maintenance program in [place] and has been for 9 years. Pt agrees to phase 2 cardiac rehab if she is unable to continue in maintenance program at this time. Will send referral to [facility].  Pt states she hopes to go home today. Pt to bed per pt request after walk, call bell within reach, family at bedside.   [phone]  Lenna Sciara, RN, BSN 02/01/2015 10:28 AM

## 2015-02-01 NOTE — Progress Notes (Signed)
Patient complaining of nausea that has been constant all day, has received IV Zofran that has not relieved. Notified Dr. Eula Fried. Will continue to monitor closely.

## 2015-02-01 NOTE — Progress Notes (Signed)
Echocardiogram 2D Echocardiogram has been performed.  Joelene Millin 02/01/2015, 11:15 AM

## 2015-02-01 NOTE — Progress Notes (Signed)
Subjective:  Looks weak but denies dyspnea or chest pain  Objective:  Vital Signs in the last 24 hours: Temp:  [98.7 F (37.1 C)-99 F (37.2 C)] 98.7 F (37.1 C) (07/14 0540) Pulse Rate:  [0-118] 58 (07/14 0947) Resp:  [0-31] 20 (07/14 0947) BP: (94-128)/(54-87) 105/67 mmHg (07/14 0947) SpO2:  [0 %-100 %] 78 % (07/14 0947) Weight:  [113 lb 15.7 oz (51.7 kg)] 113 lb 15.7 oz (51.7 kg) (07/14 0540)  Intake/Output from previous day: No intake or output data in the 24 hours ending 02/01/15 1014  Physical Exam: General appearance: alert, cooperative, cachectic and no distress Neck: no JVD Lungs: clear to auscultation bilaterally and pectus Heart: regular rate and rhythm Extremities: extremities normal, atraumatic, no cyanosis or edema Skin: pale cool and dry Neurologic: Grossly normal   Rate: 88  Rhythm: normal sinus rhythm  Lab Results:  Recent Labs  01/31/15 0402 01/31/15 0408 02/01/15 0332  WBC 11.3*  --  11.7*  HGB 13.1 14.6 12.0  PLT 256  --  237    Recent Labs  01/31/15 0408 02/01/15 0332  NA 135 133*  K 4.2 4.2  CL 98* 98*  CO2  --  26  GLUCOSE 152* 152*  BUN 16 15  CREATININE 0.80 1.01*    Recent Labs  01/31/15 1320 01/31/15 1956  TROPONINI 1.61* 1.60*    Recent Labs  01/31/15 0850  INR 1.00    Scheduled Meds: . acidophilus  1 capsule Oral Daily  . alum & mag hydroxide-simeth  30 mL Oral Once  . aspirin  81 mg Oral Daily  . atorvastatin  80 mg Oral q1800  . balsalazide  2,250 mg Oral TID  . calcium-vitamin D  1 tablet Oral BID  . fluticasone  2 spray Each Nare Daily  . metoprolol tartrate  12.5 mg Oral BID  . sodium chloride  3 mL Intravenous Q12H  . sodium chloride  3 mL Intravenous Q12H  . vitamin C  500 mg Oral Daily   Continuous Infusions:  PRN Meds:.sodium chloride, sodium chloride, acetaminophen, fentaNYL (SUBLIMAZE) injection, nitroGLYCERIN, ondansetron (ZOFRAN) IV, sodium chloride, sodium chloride   Imaging: Dg  Chest 2 View  01/31/2015   CLINICAL DATA:  Chest pain and nausea and vomiting tonight.  EXAM: CHEST  2 VIEW  FINDINGS: Heart size and pulmonary vascularity are normal. Emphysematous changes in the lungs. Bilateral perihilar infiltration. Mild bronchiectasis with interstitial changes in the lung bases. Bronchiectasis. No focal airspace disease or consolidation. No blunting of costophrenic angles. No pneumothorax. Pectus excavatum. Mediastinal contours appear intact.  IMPRESSION: Emphysematous changes with chronic fibrosis and central bronchiectasis. Infiltration in the perihilar regions may represent early pneumonia.   Electronically Signed   By: Lucienne Capers M.D.   On: 01/31/2015 04:21    Assessment/Plan:  69 yo woman (worked at [employer]) with Brice of Crohn's disease, telangectasia, GERD (? CREST), Raynaud's, and previous Takotsubo syndrome (2007 LHC with clean coronary arteries). Her last echo in Dec 2014 showed an EF of 60-65%. She has recently had some pulmonary issues, cough and dyspnea. She has been seen by Dr Chase Caller as an OP. She who presented 01/31/15 with chest discomfort and ruled in for a NSTEMI. Cath done 01/31/15 showed patent coronaries and new LVD with an EF of 25-35%.   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   Kerin Ransom PA-C 02/01/2015, 10:14 AM [phone]  Attending Note:   The patient was seen and examined.  Agree with assessment and plan as noted above.  Changes made to the above note as needed.  Ms. Hernandez presents with a recurrent Takotsubo syndrome.   Her original episode was in 2007. She had documented normal EF by echo in 2014. Cath yesterday reveals normal coronaries but with severe LV  dysfunction with apical ballooning. Prelim views of the echo show apical ballooning Will keep one more day. Anticipate DC tomorrow     Thayer Headings, Brooke Bonito., MD, [facility] 02/01/2015, 10:37 AM [address]  [place] Pager [phone]

## 2015-02-02 ENCOUNTER — Telehealth: Payer: Self-pay | Admitting: Nurse Practitioner

## 2015-02-02 ENCOUNTER — Encounter (HOSPITAL_COMMUNITY): Payer: Self-pay

## 2015-02-02 DIAGNOSIS — J984 Other disorders of lung: Secondary | ICD-10-CM

## 2015-02-02 MED ORDER — BENAZEPRIL HCL 10 MG PO TABS
10.0000 mg | ORAL_TABLET | Freq: Every day | ORAL | Status: DC
  Filled 2015-02-02: qty 1

## 2015-02-02 MED ORDER — BENAZEPRIL HCL 5 MG PO TABS: 5.0000 mg | ORAL_TABLET | Freq: Every day | ORAL | Status: DC

## 2015-02-02 MED ORDER — FLUTICASONE PROPIONATE 50 MCG/ACT NA SUSP
NASAL | Status: DC
Start: 2015-02-02 — End: 2020-07-23

## 2015-02-02 MED ORDER — ACETAMINOPHEN 325 MG PO TABS: 650.0000 mg | ORAL_TABLET | ORAL | Status: DC | PRN

## 2015-02-02 MED ORDER — ATORVASTATIN CALCIUM 80 MG PO TABS: 80.0000 mg | ORAL_TABLET | Freq: Every day | ORAL | Status: DC

## 2015-02-02 MED ORDER — NITROGLYCERIN 0.4 MG SL SUBL: 0.4000 mg | SUBLINGUAL_TABLET | SUBLINGUAL | Status: DC | PRN

## 2015-02-02 MED ORDER — CARVEDILOL 6.25 MG PO TABS: 6.2500 mg | ORAL_TABLET | Freq: Two times a day (BID) | ORAL | Status: DC

## 2015-02-02 NOTE — Care Management Important Message (Signed)
Important Message  Patient Details  Name: Kirsten Rice MRN: [id number] Date of Birth: 05/29/1946   Medicare Important Message Given:  Yes-second notification given    Pricilla Handler 02/02/2015, 11:40 AM

## 2015-02-02 NOTE — Progress Notes (Signed)
Patient sleeping, HR went up to 160s for 2-3 secs, appeared to be SVT, returned to NSR in the 80s. Notified Dr. Eula Fried and will continue to monitor.

## 2015-02-02 NOTE — Discharge Instructions (Signed)
Acute Coronary Syndrome  Acute coronary syndrome (ACS) is an urgent problem in which the blood and oxygen supply to the heart is critically deficient. ACS requires hospitalization because one or more coronary arteries may be blocked.  ACS represents a range of conditions including:  · Previous angina that is now unstable, lasts longer, happens at rest, or is more intense.  · A heart attack, with heart muscle cell injury and death.  There are three vital coronary arteries that supply the heart muscle with blood and oxygen so that it can pump blood effectively. If blockages to these arteries develop, blood flow to the heart muscle is reduced. If the heart does not get enough blood, angina may occur as the first warning sign.  SYMPTOMS   · The most common signs of angina include:  ¨ Tightness or squeezing in the chest.  ¨ Feeling of heaviness on the chest.  ¨ Discomfort in the arms, neck, back, or jaw.  ¨ Shortness of breath and nausea.  ¨ Cold, wet skin.  · Angina is usually brought on by physical effort or excitement which increase the oxygen needs of the heart. These states increase the blood flow needs of the heart beyond what can be delivered.  · Other symptoms that are not as common include:  ¨ Fatigue  ¨ Unexplained feelings of nervousness or anxiety  ¨ Weakness  ¨ Diarrhea  · Sometimes, you may not have noticed any symptoms at all but still suffered a cardiac injury.  TREATMENT   · Medicines to help discomfort may include nitroglycerin (nitro) in the form of tablets or a spray for rapid relief, or longer-acting forms such as cream, patches, or capsules. (Be aware that there are many side effects and possible interactions with other drugs).  · Other medicines may be used to help the heart pump better.  · Procedures to open blocked arteries including angioplasty or stent placement to keep the arteries open.  · Open heart surgery may be needed when there are many blockages or they are in critical locations that  are best treated with surgery.  HOME CARE INSTRUCTIONS   · Do not use any tobacco products including cigarettes, chewing tobacco, or electronic cigarettes.  · Take one baby or adult aspirin daily, if your health care provider advises. This helps reduce the risk of a heart attack.  · It is very important that you follow the angina treatment prescribed by your health care provider. Make arrangements for proper follow-up care.  · Eat a heart healthy diet with salt and fat restrictions as advised.  · Regular exercise is good for you as long as it does not cause discomfort. Do not begin any new type of exercise until you check with your health care provider.  · If you are overweight, you should lose weight.  · Try to maintain normal blood lipid levels.  · Keep your blood pressure under control as recommended by your health care provider.  · You should tell your health care provider right away about any increase in the severity or frequency of your chest discomfort or angina attacks. When you have angina, you should stop what you are doing and sit down. This may bring relief in 3 to 5 minutes. If your health care provider has prescribed nitro, take it as directed.  · If your health care provider has given you a follow-up appointment, it is very important to keep that appointment. Not keeping the appointment could result in a chronic or   permanent injury, pain, and disability. If there is any problem keeping the appointment, you must call back to this facility for assistance.  SEEK IMMEDIATE MEDICAL CARE IF:   · You develop nausea, vomiting, or shortness of breath.  · You feel faint, lightheaded, or pass out.  · Your chest discomfort gets worse.  · You are sweating or experience sudden profound fatigue.  · You do not get relief of your chest pain after 3 doses of nitro.  · Your discomfort lasts longer than 15 minutes.  MAKE SURE YOU:   · Understand these instructions.  · Will watch your condition.  · Will get help right  away if you are not doing well or get worse.  · Take all medicines as directed by your health care provider.  Document Released: 07/07/2005 Document Revised: 07/12/2013 Document Reviewed: 11/08/2013  Exit[employer]® Patient Information ©2015 [employer]. This information is not intended to replace advice given to you by your health care provider. Make sure you discuss any questions you have with your health care provider.

## 2015-02-02 NOTE — Discharge Summary (Signed)
Patient ID: Kirsten Rice,  MRN: [id number], DOB/AGE: 1946-06-11 69 y.o.  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 CREST). She has a history of a previous Takotsubo event in 2007.  LHC then showed clean coronary arteries. Her last echo in Dec 2014 showed recovery of her LVF with an EF of 60-65%. She has recently had some pulmonary issues, cough and dyspnea. Chest CT showed ground glass appearence. She has been seen by Dr Chase Caller as an OP. Low dose ACE was continued and she has tolerated this. She presented 01/31/15 with chest discomfort and ruled in for a NSTEMI. Cath done 01/31/15 showed patent coronaries and new LVD with an EF of 25-35%.  Echo confirmed Septal, apical mid anterior and inferior hypokinesis. Basal function presereved The cavity size was severely dilated. Wall thickness was normal. The estimated ejection fraction was 30%. Coreg was added. She has baseline low B/P but tolerated the addition of Coreg, Lasix was stopped. Dr Acie Fredrickson feels she is stable for discharge 02/02/15. She'll need a TOC OV in 7-10 days.    Discharge Vitals:  Blood pressure 98/61, pulse 93, temperature 98.7 F (37.1 C), temperature source Oral, resp. rate 18, height 5' 7.5" (1.715 m), weight 117 lb 8 oz (53.298 kg), SpO2 93 %.    Labs: No results found. However, due to the size of the patient record, not all encounters were searched. Please  check Results Review for a complete set of results.  Disposition:  Follow-up Information    Follow up with Sherren Mocha, MD.   Specialty:  Cardiology   Why:  office will contact you   Contact information:   [address] [place] [address] [phone]       Discharge Medications:    Medication List    STOP taking these medications        furosemide 20 MG tablet  Commonly known as:  LASIX     potassium chloride SA 20 MEQ tablet  Commonly known as:  K-DUR,KLOR-CON      TAKE these medications        acetaminophen 325 MG tablet  Commonly known as:  TYLENOL  Take 2 tablets (650 mg total) by mouth every 4 (four) hours as needed for headache or mild pain.     aspirin 81 MG tablet  Take 81 mg by mouth daily.     atorvastatin 80 MG tablet  Commonly known as:  LIPITOR  Take 1 tablet (80 mg total) by mouth daily at 6 PM.     balsalazide 750 MG capsule  Commonly known as:  COLAZAL  Take 2,250 mg by mouth 3 (three) times daily.     benazepril 10 MG tablet  Commonly known as:  LOTENSIN  Take 0.5 tablets (5 mg total) by mouth daily.     bifidobacterium infantis capsule  Take 1  capsule by mouth daily.     Biotin 2500 MCG Caps  Take 2,500 mcg by mouth daily.     Calcium-Vitamin D 600-200 MG-UNIT per tablet  Take 1 tablet by mouth daily.     carvedilol 6.25 MG tablet  Commonly known as:  COREG  Take 1 tablet (6.25 mg total) by mouth 2 (two) times daily with a meal.     fluticasone 50 MCG/ACT nasal spray  Commonly known as:  FLONASE  USE 2 SPRAYS IN EACH NOSTRIL DAILY AS NEEDED FOR CONGESTION.     nitroGLYCERIN 0.4 MG SL tablet  Commonly known as:  NITROSTAT  Place 1 tablet (0.4 mg total) under the tongue every 5 (five) minutes x 3 doses as needed for chest pain.     PRESCRIPTION MEDICATION  Apply 1 application topically daily as needed (rosasea). Pt uses prescription cream for rosasea     SYSTANE PRESERVATIVE FREE 0.4-0.3 % Soln  Generic  drug:  Polyethyl Glycol-Propyl Glycol  Place 1 Package into both eyes daily as needed (dryness).     vitamin C 500 MG tablet  Commonly known as:  ASCORBIC ACID  Take 500 mg by mouth daily.         Duration of Discharge Encounter: Greater than 30 minutes including physician time.  Angelena Form PA-C 02/02/2015 11:43 AM   Attending Note:   The patient was seen and examined.  Agree with assessment and plan as noted above.  Changes made to the above note as needed.  Pt has done well. Has recurrent Takotsubo Syndrome Started on coreg 6.25 bid and Benazepril 5 a day   Follow up with dr. Sharren Bridge, Brooke Bonito., MD, [facility] 02/02/2015, 6:17 PM [address],  [place] Pager [phone]

## 2015-02-02 NOTE — Telephone Encounter (Signed)
New message      TCM appt on 02-09-15 per [facility]

## 2015-02-02 NOTE — Progress Notes (Signed)
Subjective:  Up in bed, no complaints this am.  Objective:  Vital Signs in the last 24 hours: Temp:  [98.2 F (36.8 C)-99 F (37.2 C)] 98.7 F (37.1 C) (07/15 0500) Pulse Rate:  [58-98] 93 (07/15 0821) Resp:  [18-22] 18 (07/15 0500) BP: (105-120)/(62-72) 110/70 mmHg (07/15 0821) SpO2:  [74 %-96 %] 93 % (07/15 0500) Weight:  [117 lb 8 oz (53.298 kg)] 117 lb 8 oz (53.298 kg) (07/15 0500)  Intake/Output from previous day: No intake or output data in the 24 hours ending 02/02/15 0845  Physical Exam: General appearance: alert, cooperative, cachectic and no distress Neck: no JVD Lungs: end inspiratory crackles bilaterally and pectus Heart: regular rate and rhythm Extremities: extremities normal, atraumatic, no cyanosis or edema Skin: pale cool and dry Neurologic: Grossly normal   Rate: 88  Rhythm: normal sinus rhythm  Lab Results:  Recent Labs  01/31/15 0402 01/31/15 0408 02/01/15 0332  WBC 11.3*  --  11.7*  HGB 13.1 14.6 12.0  PLT 256  --  237    Recent Labs  01/31/15 0408 02/01/15 0332  NA 135 133*  K 4.2 4.2  CL 98* 98*  CO2  --  26  GLUCOSE 152* 152*  BUN 16 15  CREATININE 0.80 1.01*    Recent Labs  01/31/15 1320 01/31/15 1956  TROPONINI 1.61* 1.60*    Recent Labs  01/31/15 0850  INR 1.00    Scheduled Meds: . acidophilus  1 capsule Oral Daily  . aspirin  81 mg Oral Daily  . atorvastatin  80 mg Oral q1800  . balsalazide  2,250 mg Oral TID  . benazepril  10 mg Oral Daily  . calcium-vitamin D  1 tablet Oral BID  . carvedilol  6.25 mg Oral BID WC  . fluticasone  2 spray Each Nare Daily  . sodium chloride  3 mL Intravenous Q12H  . sodium chloride  3 mL Intravenous Q12H  . vitamin C  500 mg Oral Daily   Continuous Infusions:  PRN Meds:.sodium chloride, sodium chloride, acetaminophen, fentaNYL (SUBLIMAZE) injection, nitroGLYCERIN, ondansetron (ZOFRAN) IV, promethazine, sodium chloride, sodium chloride   Imaging: EXAM: CHEST 2 VIEW  01/31/15  FINDINGS: Heart size and pulmonary vascularity are normal. Emphysematous changes in the lungs. Bilateral perihilar infiltration. Mild bronchiectasis with interstitial changes in the lung bases. Bronchiectasis. No focal airspace disease or consolidation. No blunting of costophrenic angles. No pneumothorax. Pectus excavatum. Mediastinal contours appear intact.  IMPRESSION: Emphysematous changes with chronic fibrosis and central bronchiectasis. Infiltration in the perihilar regions may represent early pneumonia.   Electronically Signed  By: Lucienne Capers M.D.  On: 01/31/2015 04:21 No results found.   Echo: 02/01/15 Study Conclusions  - Left ventricle: Septal, apical mid anterior and inferior hypokinesis. Basal function presereved The cavity size was severely dilated. Wall thickness was normal. The estimated ejection fraction was 30%. - Left atrium: The atrium was mildly dilated. - Right atrium: The atrium was mildly dilated. - Atrial septum: No defect or patent foramen ovale was identified.   Assessment/Plan:  69 yo woman (worked at [employer]) with [place] of Crohn's disease, telangectasia, GERD (? CREST), Raynaud's, and previous Takotsubo syndrome (2007 LHC with clean coronary arteries). Her last echo in Dec 2014 showed an EF of 60-65%. She has recently had some pulmonary issues, cough and dyspnea. Chest CT showed ground glass appearence.  She has been seen by Dr Chase Caller as an OP. ACE was continued and she has tolerated this. She who presented 01/31/15 with  chest discomfort and ruled in for a NSTEMI. Cath done 01/31/15 showed patent coronaries and new LVD with an EF of 25-35%.   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 She will need TOC f/u in 1-2 weeks. Home this am if she tolerates her medications.  Kirsten Ransom PA-C 02/02/2015, 8:45 AM [phone]   Attending Note:   The patient was seen and examined.  Agree with assessment and plan as noted above.  Changes made to the above note as needed.  Pt is doing better.  Ready to go home. Feels much stronger On   ASA atorva 80 Coreg 6.25 BID Lotensin (decrease to 5 mg a day , she does not tolerate 10 mg a day)    Follow up with Dr. Burt Knack / PA in several weeks    Thayer Headings, Brooke Bonito., MD, [facility] 02/02/2015, 11:17 AM [address],  [place] Pager [phone]

## 2015-02-02 NOTE — Progress Notes (Signed)
CARDIAC REHAB PHASE I   PRE:  Rate/Rhythm: 89 SR  BP:  Sitting: 110/70        SaO2: UTA-pt has Raynaud's, unable to get accurate reading  MODE:  Ambulation: 400 ft   POST:  Rate/Rhythm: 63 SR  BP:  Lying: 103/60         SaO2: UTA-pt has Raynaud's, unable to get accurate reading  Pt sitting on side of bed, husband at bedside, states she feels much better today, looks better. Pt ambulated 400 ft on RA, standby assist, steady gait, tolerated well. Pt denies CP, dizziness, DOE, declined rest stop. Pt shows improved activity tolerance today, states she feels ready to go home. Pt states she has no questions regarding yesterday's education. Pt remains agreeable to phase 2 cardiac rehab until she can return to the maintenance program. Pt to bed per pt request after walk, call bell within reach.   [phone]  Lenna Sciara, RN, BSN 02/02/2015 9:55 AM

## 2015-02-03 ENCOUNTER — Emergency Department (HOSPITAL_COMMUNITY)
Admission: EM | Admit: 2015-02-03 | Discharge: 2015-02-03 | Disposition: A | Discharge status: Home / Self Care | Payer: Medicare Other | Attending: Emergency Medicine | Admitting: Emergency Medicine

## 2015-02-03 ENCOUNTER — Other Ambulatory Visit: Payer: Self-pay | Admitting: Adult Health

## 2015-02-03 ENCOUNTER — Encounter (HOSPITAL_COMMUNITY): Payer: Self-pay | Admitting: *Deleted

## 2015-02-03 ENCOUNTER — Telehealth: Payer: Self-pay | Admitting: Adult Health

## 2015-02-03 ENCOUNTER — Encounter (HOSPITAL_COMMUNITY): Payer: Self-pay | Admitting: Emergency Medicine

## 2015-02-03 ENCOUNTER — Emergency Department (INDEPENDENT_AMBULATORY_CARE_PROVIDER_SITE_OTHER)
Admission: EM | Admit: 2015-02-03 | Discharge: 2015-02-03 | Disposition: A | Discharge status: Home / Self Care | Payer: Medicare Other | Source: Home / Self Care | Attending: Family Medicine | Admitting: Family Medicine

## 2015-02-03 ENCOUNTER — Emergency Department (INDEPENDENT_AMBULATORY_CARE_PROVIDER_SITE_OTHER): Payer: Medicare Other

## 2015-02-03 DIAGNOSIS — I10 Essential (primary) hypertension: Secondary | ICD-10-CM | POA: Diagnosis not present

## 2015-02-03 DIAGNOSIS — Z79899 Other long term (current) drug therapy: Secondary | ICD-10-CM | POA: Diagnosis not present

## 2015-02-03 DIAGNOSIS — I2510 Atherosclerotic heart disease of native coronary artery without angina pectoris: Secondary | ICD-10-CM | POA: Diagnosis not present

## 2015-02-03 DIAGNOSIS — Z7982 Long term (current) use of aspirin: Secondary | ICD-10-CM | POA: Diagnosis not present

## 2015-02-03 DIAGNOSIS — Z86718 Personal history of other venous thrombosis and embolism: Secondary | ICD-10-CM | POA: Insufficient documentation

## 2015-02-03 DIAGNOSIS — R011 Cardiac murmur, unspecified: Secondary | ICD-10-CM | POA: Diagnosis not present

## 2015-02-03 DIAGNOSIS — Z85828 Personal history of other malignant neoplasm of skin: Secondary | ICD-10-CM | POA: Diagnosis not present

## 2015-02-03 DIAGNOSIS — I429 Cardiomyopathy, unspecified: Secondary | ICD-10-CM

## 2015-02-03 DIAGNOSIS — R059 Cough, unspecified: Secondary | ICD-10-CM

## 2015-02-03 DIAGNOSIS — Z8719 Personal history of other diseases of the digestive system: Secondary | ICD-10-CM | POA: Insufficient documentation

## 2015-02-03 DIAGNOSIS — I509 Heart failure, unspecified: Secondary | ICD-10-CM | POA: Diagnosis not present

## 2015-02-03 DIAGNOSIS — M1990 Unspecified osteoarthritis, unspecified site: Secondary | ICD-10-CM | POA: Diagnosis not present

## 2015-02-03 DIAGNOSIS — R9389 Abnormal findings on diagnostic imaging of other specified body structures: Secondary | ICD-10-CM

## 2015-02-03 DIAGNOSIS — I252 Old myocardial infarction: Secondary | ICD-10-CM | POA: Insufficient documentation

## 2015-02-03 DIAGNOSIS — Z8701 Personal history of pneumonia (recurrent): Secondary | ICD-10-CM | POA: Diagnosis not present

## 2015-02-03 DIAGNOSIS — R531 Weakness: Secondary | ICD-10-CM

## 2015-02-03 DIAGNOSIS — G43909 Migraine, unspecified, not intractable, without status migrainosus: Secondary | ICD-10-CM | POA: Diagnosis not present

## 2015-02-03 DIAGNOSIS — R05 Cough: Secondary | ICD-10-CM

## 2015-02-03 DIAGNOSIS — Z8639 Personal history of other endocrine, nutritional and metabolic disease: Secondary | ICD-10-CM | POA: Insufficient documentation

## 2015-02-03 DIAGNOSIS — R938 Abnormal findings on diagnostic imaging of other specified body structures: Secondary | ICD-10-CM

## 2015-02-03 LAB — BASIC METABOLIC PANEL
ANION GAP: 7 (ref 5–15)
BUN: 18 mg/dL (ref 6–20)
CALCIUM: 9 mg/dL (ref 8.9–10.3)
CHLORIDE: 99 mmol/L — AB (ref 101–111)
CO2: 28 mmol/L (ref 22–32)
Creatinine, Ser: 0.87 mg/dL (ref 0.44–1.00)
GFR calc Af Amer: >60 mL/min (ref >60)
GFR calc non Af Amer: >60 mL/min (ref >60)
GLUCOSE: 106 mg/dL — AB (ref 65–99)
Potassium: 3.9 mmol/L (ref 3.5–5.1)
SODIUM: 134 mmol/L — AB (ref 135–145)

## 2015-02-03 LAB — CBC
HEMATOCRIT: 37.5 % (ref 36.0–46.0)
HEMOGLOBIN: 12.3 g/dL (ref 12.0–15.0)
MCH: 30.1 pg (ref 26.0–34.0)
MCHC: 32.8 g/dL (ref 30.0–36.0)
MCV: 91.7 fL (ref 78.0–100.0)
Platelets: 273 10*3/uL (ref 150–400)
RBC: 4.09 MIL/uL (ref 3.87–5.11)
RDW: 13.2 % (ref 11.5–15.5)
WBC: 7.8 10*3/uL (ref 4.0–10.5)

## 2015-02-03 LAB — BRAIN NATRIURETIC PEPTIDE: B NATRIURETIC PEPTIDE 5: 1452.8 pg/mL — AB (ref 0.0–100.0)

## 2015-02-03 LAB — TROPONIN I: Troponin I: 0.2 ng/mL — ABNORMAL HIGH (ref <0.031)

## 2015-02-03 MED ORDER — DEXTROMETHORPHAN-GUAIFENESIN 10-100 MG/5ML PO SYRP: 5.0000 mL | ORAL_SOLUTION | Freq: Two times a day (BID) | ORAL | Status: DC

## 2015-02-03 MED ORDER — ONDANSETRON HCL 4 MG PO TABS: 4.0000 mg | ORAL_TABLET | Freq: Three times a day (TID) | ORAL | Status: DC | PRN

## 2015-02-03 NOTE — Discharge Instructions (Signed)
Cough, Adult  A cough is a reflex that helps clear your throat and airways. It can help heal the body or may be a reaction to an irritated airway. A cough may only last 2 or 3 weeks (acute) or may last more than 8 weeks (chronic).  CAUSES Acute cough:  Viral or bacterial infections. Chronic cough:  Infections.  Allergies.  Asthma.  Post-nasal drip.  Smoking.  Heartburn or acid reflux.  Some medicines.  Chronic lung problems (COPD).  Cancer. SYMPTOMS   Cough.  Fever.  Chest pain.  Increased breathing rate.  High-pitched whistling sound when breathing (wheezing).  Colored mucus that you cough up (sputum). TREATMENT   A bacterial cough may be treated with antibiotic medicine.  A viral cough must run its course and will not respond to antibiotics.  Your caregiver may recommend other treatments if you have a chronic cough. HOME CARE INSTRUCTIONS   Only take over-the-counter or prescription medicines for pain, discomfort, or fever as directed by your caregiver. Use cough suppressants only as directed by your caregiver.  Use a cold steam vaporizer or humidifier in your bedroom or home to help loosen secretions.  Sleep in a semi-upright position if your cough is worse at night.  Rest as needed.  Stop smoking if you smoke. SEEK IMMEDIATE MEDICAL CARE IF:   You have pus in your sputum.  Your cough starts to worsen.  You cannot control your cough with suppressants and are losing sleep.  You begin coughing up blood.  You have difficulty breathing.  You develop pain which is getting worse or is uncontrolled with medicine.  You have a fever. MAKE SURE YOU:   Understand these instructions.  Will watch your condition.  Will get help right away if you are not doing well or get worse. Document Released: 01/03/2011 Document Revised: 09/29/2011 Document Reviewed: 01/03/2011 ExitCare Patient Information 2015 [employer]. This information is not intended  to replace advice given to you by your health care provider. Make sure you discuss any questions you have with your health care provider.  

## 2015-02-03 NOTE — ED Notes (Signed)
The pt came from [facility]  Chest xray from [facility] today

## 2015-02-03 NOTE — ED Notes (Signed)
The opt is c/o a cough  Since yesterday. She was just released  From the hosp yesterday after having a mi Wednesday.  No temp non-productive cough

## 2015-02-03 NOTE — ED Provider Notes (Signed)
CSN: [id number]     Arrival date & time 02/03/15  1509 History   First MD Initiated Contact with Patient 02/03/15 1713     Chief Complaint  Patient presents with  . Cough     (Consider location/radiation/quality/duration/timing/severity/associated sxs/prior Treatment) HPI Comments: Patient presents to the ED with a chief complaint of cough. She states that she had an MI on Wednesday. States that she has now been having a cough. States that she called the cardiology office, and was told to go to urgent care for a chest x-ray. Chest x-ray at urgent care showed worsening pleural effusions, the patient was transferred to the emergency department for further evaluation. Patient states that she only has cough. She does not have any chest pain or shortness of breath. She denies any lower extremity swelling. She states that she has had some weight gain. There are no aggravating or alleviating factors.  The history is provided by the patient. No language interpreter was used.    Past Medical History  Diagnosis Date  . Takotsubo cardiomyopathy 2007  . Raynaud's disease   . Mitral valve prolapse   . Crohn's disease   . Deep vein thrombosis     pt denies: 04/08/13  . Telangiectasia   . Hyperthyroidism     pt denies:  04/08/13  . PONV (postoperative nausea and vomiting) 1970's    "when I had my appendix out"  . Hypertension   . Heart murmur   . CHF (congestive heart failure)     "after 2nd cath"  . Pneumonia     "2-3 times" (01/31/2015)  . Migraines     occular; "had them alot when I was younger & having my periods; don't have them anymore" (01/31/2015)  . Headache     "weekly" (01/31/2015)  . Arthritis     "probably a little bit in my knees and hips" (01/31/2015)  . NSTEMI (non-ST elevated myocardial infarction) 01/31/2015  . Basal cell carcinoma   . Squamous carcinoma   . Coronary artery disease    Past Surgical History  Procedure Laterality Date  . Shoulder arthroscopy w/ rotator cuff  repair Right 1996  . Tubal ligation  1973  . Appendectomy  1971  . Colonoscopy    . Colonoscopy  05/11/2012    Procedure: COLONOSCOPY;  Surgeon: Lafayette Dragon, MD;  Location: [facility];  Service: Endoscopy;  Laterality: N/A;  . Cardiac catheterization  2006; 2007  . Basal cell carcinoma excision      forehead, back, legs, arms" (01/31/2015)  . Squamous cell carcinoma excision Left early 1980's    "eyelid"  . Cardiac catheterization N/A 01/31/2015    Procedure: Left Heart Cath and Coronary Angiography;  Surgeon: Jettie Booze, MD;  Location: [facility];  Service: Cardiovascular;  Laterality: N/A;   Family History  Problem Relation Age of Onset  . Aortic stenosis Father   . Hypertension Father   . Colon cancer Neg Hx   . Diabetes Sister   . Hyperthyroidism Sister   . Diabetes Sister   . Hyperthyroidism Sister   . Hypertension Mother   . Hyperthyroidism Sister   . Hyperthyroidism Sister    History  Substance Use Topics  . Smoking status: Never Smoker   . Smokeless tobacco: Never Used  . Alcohol Use: 6.0 oz/week    10 Glasses of wine per week   OB History    No data available     Review of Systems  Constitutional: Negative for  fever and chills.  Respiratory: Positive for cough. Negative for shortness of breath.   Cardiovascular: Negative for chest pain.  Gastrointestinal: Negative for nausea, vomiting, diarrhea and constipation.  Genitourinary: Negative for dysuria.  All other systems reviewed and are negative.     Allergies  Cafergot; Compazine; Prochlorperazine edisylate; and Tetanus-diphtheria toxoids td  Home Medications   Prior to Admission medications   Medication Sig Start Date End Date Taking? Authorizing Provider  acetaminophen (TYLENOL) 325 MG tablet Take 2 tablets (650 mg total) by mouth every 4 (four) hours as needed for headache or mild pain. 02/02/15  Yes Erlene Quan, PA-C  aspirin 81 MG tablet Take 81 mg by mouth daily.     Yes  Historical Provider, MD  balsalazide (COLAZAL) 750 MG capsule Take 2,250 mg by mouth 3 (three) times daily.  07/27/13  Yes Lafayette Dragon, MD  benazepril (LOTENSIN) 10 MG tablet Take 0.5 tablets (5 mg total) by mouth daily. 06/09/14  Yes Sherren Mocha, MD  bifidobacterium infantis (ALIGN) capsule Take 1 capsule by mouth daily.   Yes Historical Provider, MD  Biotin 2500 MCG CAPS Take 2,500 mcg by mouth daily.    Yes Historical Provider, MD  Calcium-Vitamin D 600-200 MG-UNIT per tablet Take 1 tablet by mouth daily.  02/01/13  Yes Lafayette Dragon, MD  carvedilol (COREG) 6.25 MG tablet Take 1 tablet (6.25 mg total) by mouth 2 (two) times daily with a meal. 02/02/15  Yes Erlene Quan, PA-C  clobetasol cream (TEMOVATE) 3.26 % Apply 1 application topically at bedtime as needed (dry skin on hands).  01/26/15  Yes Historical Provider, MD  fluticasone (FLONASE) 50 MCG/ACT nasal spray USE 2 SPRAYS IN EACH NOSTRIL DAILY AS NEEDED FOR CONGESTION. 02/02/15  Yes Luke K Kilroy, PA-C  Polyethyl Glycol-Propyl Glycol (SYSTANE PRESERVATIVE FREE) 0.4-0.3 % SOLN Place 1 Package into both eyes daily as needed (dryness).   Yes Historical Provider, MD  vitamin C (ASCORBIC ACID) 500 MG tablet Take 500 mg by mouth daily.     Yes Historical Provider, MD  atorvastatin (LIPITOR) 80 MG tablet Take 1 tablet (80 mg total) by mouth daily at 6 PM. Patient not taking: Reported on 02/03/2015 02/02/15   Erlene Quan, PA-C  Dextromethorphan-Guaifenesin 10-100 MG/5ML liquid Take 5 mLs by mouth every 12 (twelve) hours. 02/03/15   Lendon Colonel, NP  furosemide (LASIX) 20 MG tablet Take 20 mg by mouth daily. 01/10/15   Historical Provider, MD  nitroGLYCERIN (NITROSTAT) 0.4 MG SL tablet Place 1 tablet (0.4 mg total) under the tongue every 5 (five) minutes x 3 doses as needed for chest pain. 02/02/15   Erlene Quan, PA-C  ondansetron (ZOFRAN) 4 MG tablet Take 1 tablet (4 mg total) by mouth every 8 (eight) hours as needed for nausea or vomiting.  02/03/15   Lendon Colonel, NP  potassium chloride SA (K-DUR,KLOR-CON) 20 MEQ tablet Take 20 mEq by mouth daily. 01/10/15   Historical Provider, MD  PRESCRIPTION MEDICATION Apply 1 application topically daily as needed (rosasea). Pt uses prescription cream for rosasea    Historical Provider, MD   BP 112/72 mmHg  Pulse 81  Temp(Src) 97.4 F (36.3 C)  Resp 19  Ht 5' 7.5" (1.715 m)  Wt 119 lb 3 oz (54.063 kg)  BMI 18.38 kg/m2  SpO2 93% Physical Exam  Constitutional: She is oriented to person, place, and time. She appears well-developed and well-nourished.  HENT:  Head: Normocephalic and atraumatic.  Eyes: Conjunctivae and  EOM are normal. Pupils are equal, round, and reactive to light.  Neck: Normal range of motion. Neck supple.  Cardiovascular: Normal rate and regular rhythm.  Exam reveals no gallop and no friction rub.   No murmur heard. Pulmonary/Chest: Effort normal and breath sounds normal. No respiratory distress. She has no wheezes. She has no rales. She exhibits no tenderness.  Abdominal: Soft. Bowel sounds are normal. She exhibits no distension and no mass. There is no tenderness. There is no rebound and no guarding.  Musculoskeletal: Normal range of motion. She exhibits no edema or tenderness.  Neurological: She is alert and oriented to person, place, and time.  Skin: Skin is warm and dry.  Psychiatric: She has a normal mood and affect. Her behavior is normal. Judgment and thought content normal.  Nursing note and vitals reviewed.   ED Course  Procedures (including critical care time) Labs Review Labs Reviewed  BASIC METABOLIC PANEL - Abnormal; Notable for the following:    Sodium 134 (*)    Chloride 99 (*)    Glucose, Bld 106 (*)    All other components within normal limits  TROPONIN I - Abnormal; Notable for the following:    Troponin I 0.20 (*)    All other components within normal limits  CBC  BRAIN NATRIURETIC PEPTIDE    Imaging Review Dg Chest 2  View  02/03/2015   CLINICAL DATA:  Cough and chills.  EXAM: CHEST  2 VIEW  COMPARISON:  01/31/2015  FINDINGS: Mild cardiac enlargement. There are small bilateral pleural effusions which are increased from previous exam. Interstitial edema pattern appears stable to improved in the interval. The lungs are hyperinflated and there are coarsened interstitial markings bilaterally.  IMPRESSION: 1. Increased in small bilateral pleural effusions with improvement in diffuse edema.   Electronically Signed   By: Kerby Moors M.D.   On: 02/03/2015 14:10     EKG Interpretation   Date/Time:  Saturday February 03 2015 15:30:52 EDT Ventricular Rate:  77 PR Interval:  152 QRS Duration: 88 QT Interval:  426 QTC Calculation: 482 R Axis:   81 Text Interpretation:  Normal sinus rhythm Biatrial enlargement Possible  Anterior infarct , age undetermined T wave abnormality, consider  inferolateral ischemia When compared with ECG of 02/01/2015 T wave  abnormality Inferior leads is more pronounced Otherwise no significant  change Confirmed by [facility]  MD, Nunzio Cory [phone]) on 02/03/2015 6:13:44 PM      MDM   Final diagnoses:  Cough    Patient presents with cough today.  Admitted for NSTEMI 3 days ago.  Seen at [facility] today and had worsening pleural effusions.  Troponin today is trending down appropriately.  Will consult cardiology regarding worsening pleural effusions and weight gain.  6:07 PM Patient discussed with Dr. Claiborne Billings from cardiology, who recommends no further emergent workup at this time.  States that troponin is appropriately trending down.  States that in the absence of elevated JVD, SOB, or chest pain he would not advise restarting lasix.  Recommends outpatient follow-up.   Montine Circle, PA-C 02/03/15 [place], DO 02/06/15 2206

## 2015-02-03 NOTE — Telephone Encounter (Signed)
Patient called complaining of hacking non-productive coughing, weakness and dizziness. She had recently been discharged from the hospital on 02/02/2015 with NSTEMI and Takotusubo cardiomyopathy. She requested something for coughing and nausea.  I checked CXR from recent admission: I  MPRESSION: Emphysematous changes with chronic fibrosis and central bronchiectasis. Infiltration in the perihilar regions may represent early pneumonia.  I recommended that she be seen at Urgent Care for symptoms and repeat CXR. I called in Robitussin and Zofran to her pharmacy.  She stated she would see Urgent Care provider.

## 2015-02-03 NOTE — ED Notes (Signed)
Chest pain when she coughs only  .  No pain now

## 2015-02-03 NOTE — ED Notes (Signed)
Admitted Wednesday 7/13 for a mi.  Discharged home Friday 7/15.  Patient reports having cough, chills, and fever.  Cough is a hacking cough.  Patient reports symptoms started Thursday 7/14.  Patient reports she was told to come here for a chest xray since since chest xray in hospital showed bronchitis/pneumonia

## 2015-02-03 NOTE — ED Provider Notes (Signed)
CSN: [id number]     Arrival date & time 02/03/15  1305 History   First MD Initiated Contact with Patient 02/03/15 1336     Chief Complaint  Patient presents with  . Cough  . Chills   (Consider location/radiation/quality/duration/timing/severity/associated sxs/prior Treatment) HPI Comments: 69 year old female was admitted to the emergency department: 3 days ago for chest pain. She has a history of CAD and cardiomyopathy. She was discharged yesterday. Today she presents with persistent weakness, cough. Since the chest x-ray just prior to discharge showed infiltrates that may be signs of early pneumonia she was directed by the cardiology NP to come to the urgent care to get a chest x-ray. There are increases and the infiltrates of question from 2-3 days ago. The patient has no chest pain, heaviness, tightness or pressure. Denies any new shortness of breath. She has a history of cardiomyopathy now with an EF of 30%, CAD, COPD, CHF, pneumonia, Crohn's disease and mitral valve prolapse.    Past Medical History  Diagnosis Date  . Takotsubo cardiomyopathy 2007  . Raynaud's disease   . Mitral valve prolapse   . Crohn's disease   . Deep vein thrombosis     pt denies: 04/08/13  . Telangiectasia   . Hyperthyroidism     pt denies:  04/08/13  . PONV (postoperative nausea and vomiting) 1970's    "when I had my appendix out"  . Hypertension   . Heart murmur   . CHF (congestive heart failure)     "after 2nd cath"  . Pneumonia     "2-3 times" (01/31/2015)  . Migraines     occular; "had them alot when I was younger & having my periods; don't have them anymore" (01/31/2015)  . Headache     "weekly" (01/31/2015)  . Arthritis     "probably a little bit in my knees and hips" (01/31/2015)  . NSTEMI (non-ST elevated myocardial infarction) 01/31/2015  . Basal cell carcinoma   . Squamous carcinoma    Past Surgical History  Procedure Laterality Date  . Shoulder arthroscopy w/ rotator cuff repair Right 1996   . Tubal ligation  1973  . Appendectomy  1971  . Colonoscopy    . Colonoscopy  05/11/2012    Procedure: COLONOSCOPY;  Surgeon: Lafayette Dragon, MD;  Location: [facility];  Service: Endoscopy;  Laterality: N/A;  . Cardiac catheterization  2006; 2007  . Basal cell carcinoma excision      forehead, back, legs, arms" (01/31/2015)  . Squamous cell carcinoma excision Left early 1980's    "eyelid"  . Cardiac catheterization N/A 01/31/2015    Procedure: Left Heart Cath and Coronary Angiography;  Surgeon: Jettie Booze, MD;  Location: [place] [facility];  Service: Cardiovascular;  Laterality: N/A;   Family History  Problem Relation Age of Onset  . Aortic stenosis Father   . Hypertension Father   . Colon cancer Neg Hx   . Diabetes Sister   . Hyperthyroidism Sister   . Diabetes Sister   . Hyperthyroidism Sister   . Hypertension Mother   . Hyperthyroidism Sister   . Hyperthyroidism Sister    History  Substance Use Topics  . Smoking status: Never Smoker   . Smokeless tobacco: Never Used  . Alcohol Use: 6.0 oz/week    10 Glasses of wine per week   OB History    No data available     Review of Systems  Constitutional: Positive for activity change. Negative for fever and diaphoresis.  HENT: Negative for congestion, ear pain and sore throat.   Respiratory: Positive for cough. Negative for chest tightness, shortness of breath and wheezing.   Cardiovascular: Negative for chest pain, palpitations and leg swelling.  Gastrointestinal: Negative.   Musculoskeletal: Negative.     Allergies  Cafergot; Compazine; Prochlorperazine edisylate; and Tetanus-diphtheria toxoids td  Home Medications   Prior to Admission medications   Medication Sig Start Date End Date Taking? Authorizing Provider  acetaminophen (TYLENOL) 325 MG tablet Take 2 tablets (650 mg total) by mouth every 4 (four) hours as needed for headache or mild pain. 02/02/15   Erlene Quan, PA-C  aspirin 81 MG tablet Take 81 mg  by mouth daily.      Historical Provider, MD  atorvastatin (LIPITOR) 80 MG tablet Take 1 tablet (80 mg total) by mouth daily at 6 PM. 02/02/15   Erlene Quan, PA-C  balsalazide (COLAZAL) 750 MG capsule Take 2,250 mg by mouth 3 (three) times daily.  07/27/13   Lafayette Dragon, MD  benazepril (LOTENSIN) 10 MG tablet Take 0.5 tablets (5 mg total) by mouth daily. 06/09/14   Sherren Mocha, MD  bifidobacterium infantis (ALIGN) capsule Take 1 capsule by mouth daily.    Historical Provider, MD  Biotin 2500 MCG CAPS Take 2,500 mcg by mouth daily.     Historical Provider, MD  Calcium-Vitamin D 600-200 MG-UNIT per tablet Take 1 tablet by mouth daily.  02/01/13   Lafayette Dragon, MD  carvedilol (COREG) 6.25 MG tablet Take 1 tablet (6.25 mg total) by mouth 2 (two) times daily with a meal. 02/02/15   Erlene Quan, PA-C  Dextromethorphan-Guaifenesin 10-100 MG/5ML liquid Take 5 mLs by mouth every 12 (twelve) hours. 02/03/15   Lendon Colonel, NP  fluticasone (FLONASE) 50 MCG/ACT nasal spray USE 2 SPRAYS IN EACH NOSTRIL DAILY AS NEEDED FOR CONGESTION. 02/02/15   Erlene Quan, PA-C  nitroGLYCERIN (NITROSTAT) 0.4 MG SL tablet Place 1 tablet (0.4 mg total) under the tongue every 5 (five) minutes x 3 doses as needed for chest pain. 02/02/15   Erlene Quan, PA-C  ondansetron (ZOFRAN) 4 MG tablet Take 1 tablet (4 mg total) by mouth every 8 (eight) hours as needed for nausea or vomiting. 02/03/15   Lendon Colonel, NP  Polyethyl Glycol-Propyl Glycol (SYSTANE PRESERVATIVE FREE) 0.4-0.3 % SOLN Place 1 Package into both eyes daily as needed (dryness).    Historical Provider, MD  PRESCRIPTION MEDICATION Apply 1 application topically daily as needed (rosasea). Pt uses prescription cream for rosasea    Historical Provider, MD  vitamin C (ASCORBIC ACID) 500 MG tablet Take 500 mg by mouth daily.      Historical Provider, MD   BP 106/73 mmHg  Pulse 83  Temp(Src) 97 F (36.1 C) (Oral)  Resp 22  SpO2 94% Physical Exam   Constitutional: She is oriented to person, place, and time. She appears well-developed and well-nourished. No distress.  Neck: Normal range of motion. Neck supple.  Cardiovascular: Normal rate and regular rhythm.   Pulmonary/Chest: Effort normal.  Breath sounds are heard throughout bilaterally. There are bilateral crackles in the mid and lower fields.  Musculoskeletal: She exhibits no edema.  Neurological: She is alert and oriented to person, place, and time.  Skin: Skin is warm and dry.  Psychiatric: She has a normal mood and affect.  Nursing note and vitals reviewed.   ED Course  Procedures (including critical care time) Labs Review Labs Reviewed - No data to  display  Imaging Review Dg Chest 2 View  02/03/2015   CLINICAL DATA:  Cough and chills.  EXAM: CHEST  2 VIEW  COMPARISON:  01/31/2015  FINDINGS: Mild cardiac enlargement. There are small bilateral pleural effusions which are increased from previous exam. Interstitial edema pattern appears stable to improved in the interval. The lungs are hyperinflated and there are coarsened interstitial markings bilaterally.  IMPRESSION: 1. Increased in small bilateral pleural effusions with improvement in diffuse edema.   Electronically Signed   By: Kerby Moors M.D.   On: 02/03/2015 14:10     MDM   1. Cough   2. Abnormal chest x-ray   3. Weakness   4. Cardiomyopathy    Transfer to ED via shuttle for E/M cough, weakness, changes in CXR associated with cardiomyopathy and CAD and recent hospital admission.    Janne Napoleon, NP 02/03/15 [phone]

## 2015-02-03 NOTE — ED Notes (Signed)
Spoke with Lab about BNP. BNP is being ran at this time. Pt updated.

## 2015-02-03 NOTE — ED Notes (Signed)
O2 sat remained between 95-98% while ambulating.

## 2015-02-05 NOTE — Telephone Encounter (Signed)
Patient contacted regarding discharge from [facility]  on 02/02/15.  Patient understands to follow up with provider Ignacia Bayley NP on 02/09/15 at 31 at [facility]. Patient understands discharge instructions? yes Patient understands medications and regiment? no Patient understands to bring all medications to this visit? no  Pt states that after she was discharged from the hospital on 02/02/15, she went back to the ED for complaints of chronic and continuous cough.  Pt states she was advised not to start back her Lasix regimen.  Pt states that Jory Sims NP advised her to get OTC dextromethorphan-guaifenesin for the continuous cough.  Pt is worried about stopping her Lasix, for this helps her with her cough.  Pt states she was told at her ED visit that she could have a mild case of pneumonia, but no ABT was initiated and she was discharged home.  Pt states she will be going to see her PCP this afternoon for follow-up of her continuous cough, and discuss restarting her lasix, asking for a better cough suppressant, and will ask if a antibiotic should be initiated for possible pneumonia/pleural effusion, and will ask for an inhaler to aid with her continuous dry cough. Pt states after her visit with her PCP today, she will call our office and inform us of her visit and any new med recommendations. Will leave this TCM call on the triage desktop, to have when the pt calls back.

## 2015-02-06 ENCOUNTER — Telehealth (HOSPITAL_COMMUNITY): Payer: Self-pay | Admitting: *Deleted

## 2015-02-06 ENCOUNTER — Encounter (HOSPITAL_COMMUNITY): Payer: Self-pay

## 2015-02-08 ENCOUNTER — Encounter (HOSPITAL_COMMUNITY): Payer: Self-pay

## 2015-02-09 ENCOUNTER — Encounter: Payer: Self-pay | Admitting: Nurse Practitioner

## 2015-02-09 ENCOUNTER — Encounter (HOSPITAL_COMMUNITY): Payer: Self-pay

## 2015-02-09 ENCOUNTER — Ambulatory Visit (INDEPENDENT_AMBULATORY_CARE_PROVIDER_SITE_OTHER): Payer: Medicare Other | Admitting: Nurse Practitioner

## 2015-02-09 ENCOUNTER — Telehealth: Payer: Self-pay

## 2015-02-09 VITALS — BP 98/73 | HR 60 | Ht 67.5 in | Wt 114.0 lb

## 2015-02-09 DIAGNOSIS — I5022 Chronic systolic (congestive) heart failure: Secondary | ICD-10-CM

## 2015-02-09 DIAGNOSIS — R05 Cough: Secondary | ICD-10-CM

## 2015-02-09 DIAGNOSIS — R059 Cough, unspecified: Secondary | ICD-10-CM | POA: Insufficient documentation

## 2015-02-09 DIAGNOSIS — R011 Cardiac murmur, unspecified: Secondary | ICD-10-CM | POA: Insufficient documentation

## 2015-02-09 DIAGNOSIS — I10 Essential (primary) hypertension: Secondary | ICD-10-CM | POA: Insufficient documentation

## 2015-02-09 DIAGNOSIS — I5181 Takotsubo syndrome: Secondary | ICD-10-CM

## 2015-02-09 LAB — BASIC METABOLIC PANEL
BUN: 19 mg/dL (ref 6–23)
CALCIUM: 9.8 mg/dL (ref 8.4–10.5)
CO2: 33 mEq/L — ABNORMAL HIGH (ref 19–32)
CREATININE: 0.92 mg/dL (ref 0.40–1.20)
Chloride: 98 mEq/L (ref 96–112)
GFR: 63.94 mL/min (ref >60.00)
Glucose, Bld: 90 mg/dL (ref 70–99)
POTASSIUM: 4.3 meq/L (ref 3.5–5.1)
Sodium: 139 mEq/L (ref 135–145)

## 2015-02-09 MED ORDER — ALBUTEROL SULFATE HFA 108 (90 BASE) MCG/ACT IN AERS: 2.0000 | INHALATION_SPRAY | Freq: Four times a day (QID) | RESPIRATORY_TRACT | Status: DC | PRN

## 2015-02-09 NOTE — Telephone Encounter (Signed)
-----   Message from Rogelia Mire, NP sent at 02/09/2015  5:02 PM EDT ----- Bun/creat stable.  K wnl.  Ok to take lasix 20 daily as we discussed.

## 2015-02-09 NOTE — Progress Notes (Signed)
Patient Name: Kirsten Rice Date of Encounter: 02/09/2015  Primary Care Provider:  Sheela Stack, MD Primary Cardiologist:  Jerilynn Mages. Burt Knack, MD   Chief Complaint  69 y/o female with a h/o takotsubo CM who presents for f/u after recent hospitalization for recurrent nstemi and takotsubo event.  Past Medical History   Past Medical History  Diagnosis Date  . Takotsubo cardiomyopathy     a. 2007 Cath: nl cors, apical ballooning w/ subsequent recovery of LV fxn;  b. 01/2015 NSTEMI/Cath: nl cors, EF 25-25% w/ apical ballooning;  c. 01/2015 echo: EF 30%, septal, apical, mid anterior, inf HK, mildly dil LA/RA.  Marland Kitchen Raynaud's disease   . Mitral valve prolapse     a. 01/2015 Echo: Ef 25-35%, triv MR.  . Crohn's disease   . Deep vein thrombosis     pt denies: 04/08/13  . Telangiectasia   . Hyperthyroidism     pt denies:  04/08/13  . PONV (postoperative nausea and vomiting) 1970's    "when I had my appendix out"  . Essential hypertension   . Chronic systolic CHF (congestive heart failure)     a. 01/2015 Echo: EF 25-35% in setting of takotsubo cardiomyopathy.  . History of pneumonia     "2-3 times" (01/31/2015)  . Ocular headache     "weekly" (01/31/2015)  . Arthritis     "probably a little bit in my knees and hips" (01/31/2015)  . Basal cell carcinoma   . Squamous carcinoma   . Persistent dry cough    Past Surgical History  Procedure Laterality Date  . Shoulder arthroscopy w/ rotator cuff repair Right 1996  . Tubal ligation  1973  . Appendectomy  1971  . Colonoscopy    . Colonoscopy  05/11/2012    Procedure: COLONOSCOPY;  Surgeon: Lafayette Dragon, MD;  Location: [facility];  Service: Endoscopy;  Laterality: N/A;  . Cardiac catheterization  2006; 2007  . Basal cell carcinoma excision      forehead, back, legs, arms" (01/31/2015)  . Squamous cell carcinoma excision Left early 1980's    "eyelid"  . Cardiac catheterization N/A 01/31/2015    Procedure: Left Heart Cath and Coronary  Angiography;  Surgeon: Jettie Booze, MD;  Location: [place] [facility];  Service: Cardiovascular;  Laterality: N/A;    Allergies  Allergies  Allergen Reactions  . Cafergot [Ergotamine-Caffeine] Other (See Comments)    Vagal paralysis  . Compazine [Prochlorperazine Edisylate] Other (See Comments)    Other reaction(s): stroke like sx paralysis of face  . Ergotamine-Caffeine     Other reaction(s): vagal response  . Prochlorperazine Edisylate Other (See Comments)    REACTION: extrapyrimadal syndrome  . Tetanus-Diphtheria Toxoids Td Swelling    REACTION: arm swells    HPI  69 y/o female with the above complex past medical history.  She has a h/o takotsubo CM in 2007 with subsequent normalization of LV fxn.  She has done reasonably well over the past 9 yrs but was admitted to [facility] earlier this month after developing chest pain while having a stressful conversation on the phone.  Trop was abnl and she underwent urgent cath revealing nl cors.  EF was found to be down @ 25-35%.  Echo corroborated findings on LV gram, with EF being 30% with wma's and apical ballooning.  She was maintained on her prior dose of acei therapy and coreg was added.  She was subsequently discharged.  Following d/c, she noted a dry cough and was eval [facility]  [facility] and subsequently the ER 2/2 CXR @ [facility] showing pleural effusions.  BNP was elev @ 1452.  She was d/c'd from the ED, though she was not advised to resume diuretics (these were not Rx @ prior d/c from the hospital).  2 days later, her wt had climbed 7 lbs and she saw her PCP and was placed on lasix 40 mg daily.  Since then, her wt is back down to 114 lbs, which is her baseline.  She has had improvement in DOE and lower ext edema.  She continues to have a dry, nagging cough, despite taking OTC cough medicine.  She would like to try an inhaler, as this has helped in the past.  She denies chest pain, palpitations, pnd, orthopnea, n, v, dizziness, syncope, or early satiety.    Home Medications  Prior to Admission medications   Medication Sig Start Date End Date Taking? Authorizing Provider  furosemide (LASIX) 20 MG tablet Take 40 mg by mouth daily.  01/10/15  Yes Historical Provider, MD  acetaminophen (TYLENOL) 325 MG tablet Take 2 tablets (650 mg total) by mouth every 4 (four) hours as needed for headache or mild pain. 02/02/15   Erlene Quan, PA-C  albuterol (PROVENTIL HFA;VENTOLIN HFA) 108 (90 BASE) MCG/ACT inhaler Inhale 2 puffs into the lungs every 6 (six) hours as needed for wheezing or shortness of breath. 02/09/15   Rogelia Mire, NP  aspirin 81 MG tablet Take 81 mg by mouth daily.      Historical Provider, MD  atorvastatin (LIPITOR) 80 MG tablet Take 1 tablet (80 mg total) by mouth daily at 6 PM. Patient not taking: Reported on 02/03/2015 02/02/15   Erlene Quan, PA-C  balsalazide (COLAZAL) 750 MG capsule Take 2,250 mg by mouth 3 (three) times daily.  07/27/13   Lafayette Dragon, MD  benazepril (LOTENSIN) 10 MG tablet Take 0.5 tablets (5 mg total) by mouth daily. 06/09/14   Sherren Mocha, MD  bifidobacterium infantis (ALIGN) capsule Take 1 capsule by mouth daily.    Historical Provider, MD  Biotin 2500 MCG CAPS Take 2,500 mcg by mouth daily.     Historical Provider, MD  Calcium-Vitamin D 600-200 MG-UNIT per tablet Take 1 tablet by mouth daily.  02/01/13   Lafayette Dragon, MD  carvedilol (COREG) 6.25 MG tablet Take 1 tablet (6.25 mg total) by mouth 2 (two) times daily with a meal. 02/02/15   Erlene Quan, PA-C  clobetasol cream (TEMOVATE) 1.74 % Apply 1 application topically at bedtime as needed (dry skin on hands).  01/26/15   Historical Provider, MD  Dextromethorphan-Guaifenesin 10-100 MG/5ML liquid Take 5 mLs by mouth every 12 (twelve) hours. 02/03/15   Lendon Colonel, NP  fluticasone (FLONASE) 50 MCG/ACT nasal spray USE 2 SPRAYS IN EACH NOSTRIL DAILY AS NEEDED FOR CONGESTION. 02/02/15   Erlene Quan, PA-C  nitroGLYCERIN (NITROSTAT) 0.4 MG SL tablet Place 1  tablet (0.4 mg total) under the tongue every 5 (five) minutes x 3 doses as needed for chest pain. 02/02/15   Erlene Quan, PA-C  ondansetron (ZOFRAN) 4 MG tablet Take 1 tablet (4 mg total) by mouth every 8 (eight) hours as needed for nausea or vomiting. 02/03/15   Lendon Colonel, NP  Polyethyl Glycol-Propyl Glycol (SYSTANE PRESERVATIVE FREE) 0.4-0.3 % SOLN Place 1 Package into both eyes daily as needed (dryness).    Historical Provider, MD  potassium chloride SA (K-DUR,KLOR-CON) 20 MEQ tablet Take 20 mEq by mouth daily. 01/10/15  Historical Provider, MD  PRESCRIPTION MEDICATION Apply 1 application topically daily as needed (rosasea). Pt uses prescription cream for rosasea    Historical Provider, MD  vitamin C (ASCORBIC ACID) 500 MG tablet Take 500 mg by mouth daily.      Historical Provider, MD    Review of Systems   As above, she's been bothered by a persistent, nonproductive nagging cough. Dyspnea and edema have improved since resuming lasix. She does have a fair amount of life stress as her husband has been diagnosed with dementia and has been combative at home. She denies PND, orthopnea, dizziness, syncope, or early satiety. All other systems reviewed and are otherwise negative except as noted above.  Physical Exam  VS:  BP 98/73 mmHg  Pulse 60  Ht 5' 7.5" (1.715 m)  Wt 114 lb (51.71 kg)  BMI 17.58 kg/m2 , BMI Body mass index is 17.58 kg/(m^2). GEN: Well nourished, well developed, in no acute distress. HEENT: normal. Neck: Supple, no JVD, carotid bruits, or masses. Cardiac: RRR, no murmurs, rubs, or gallops. No clubbing, cyanosis, edema.  Radials/DP/PT 2+ and equal bilaterally.  Respiratory:  Respirations regular and unlabored, clear to auscultation bilaterally. GI: Soft, nontender, nondistended, BS + x 4. MS: no deformity or atrophy. Skin: warm and dry, no rash. Neuro:  Strength and sensation are intact. Psych: Normal affect.  Accessory Clinical Findings  ECG - regular sinus  rhythm, 60, biatrial enlargement, inferior and anterolateral T-wave inversion, which has been seen previously.  Assessment & Plan  1.  Takotsubo cardiomyopathy/chronic systolic edges of heart failure: Patient is status post recent hospitalization for chest pain with elevated cardiac markers. Catheterization showed normal coronary arteries and echo showed LV dysfunction with an EF of 30% and wall motion abnormalities suggestive of a takotsubo cardiomyopathy.  She did have some volume overload when seen recently in the emergency department and this has improved with the resumption of Lasix therapy. Her volume is stable today. Her weight is back down to her dry weight of 114 pounds. I have recommended that she reduce her Lasix back to 20 mg daily, which she had been taking prior to her hospitalization. She is a retired Marine scientist and is able to take an additional Lasix of 20 mg when necessary weight gain greater than 2 pounds in 1 day or 5 pounds over the course of the week. I will follow up with basic metabolic panel today. She remains on beta blocker and ACE inhibitor therapy. Of note, she has been on ACE inhibitor therapy for several years and thus it is unlikely that this is now contributing to a persistent cough. I will arrange for follow-up 2-D echocardiogram in approximately 40 days to reevaluate LV function and determine whether or not she requires elective physiology referral.  We discussed the importance of daily weights, sodium restriction, medication compliance, and symptom reporting and she verbalizes understanding.   2. Persistent cough: She is been experiencing a nagging, nonproductive cough since her hospitalization. Chest x-ray showed small bilateral effusions on 716. She has since resumed Lasix and this has not seemed to change her cough much. She is euvolemic on exam today and her lungs are clear. She has tried over-the-counter cough medicine without improvement. She would like to try albuterol  inhaler as this has worked for her in the past. I have provided her with a prescription for an albuterol MDI. Notably, she is not wheezing on exam.  3. Essential hypertension: Stable.  4. Lipids: In the setting of elevated troponins  and presumed acute coronary syndrome, patient was discharged home on Lipitor 80 mg daily. She had normal coronary arteries on catheterization and ultimately was deemed that her presentation was secondary to a takotsubo cardiomyopathy as outlined in #1 above. She wishes to come off of Lipitor therapy. Her total cholesterol is 158 with an HDL of 66.  Her 10 year risk of an event is 4.4%.  She plans to discontinue lipitor.  5. Anxiety: pt has had increased life stress and is interested in therapy for anxiety.  I have advised that she seek treatment from her PCP.  6.   Disposition:  F/u echo in ~ 6 wks.  F/u bmet today.  F/u Dr. Burt Knack or APP following echo.   Murray Hodgkins, NP 02/09/2015, 1:24 PM

## 2015-02-09 NOTE — Telephone Encounter (Signed)
The patient saw Ignacia Bayley, NP today.

## 2015-02-09 NOTE — Patient Instructions (Signed)
Medication Instruction Your physician has recommended you make the following change in your medication:  An Rx for an Albuterol inhaler has been sent to your pharmacy. Use as directed  Labwork: Bmet Today  Testing/Procedures: Your physician has requested that you have an echocardiogram. Echocardiography is a painless test that uses sound waves to create images of your heart. It provides your doctor with information about the size and shape of your heart and how well your heart's chambers and valves are working. This procedure takes approximately one hour. There are no restrictions for this procedure. (To be scheduled in 1 month)  Follow-Up: Your physician recommends that you schedule a follow-up appointment in: 4-8 weeks with Dr.Cooper/ or a APP  Any Other Special Instructions Will Be Listed Below (If Applicable).

## 2015-02-09 NOTE — Telephone Encounter (Signed)
Pt made aware of results no questions at this time.  Pt would like to know if okay for her for her to drive? Routed to C. Sharolyn Douglas, NP

## 2015-02-11 ENCOUNTER — Encounter: Payer: Self-pay | Admitting: Nurse Practitioner

## 2015-02-12 NOTE — Telephone Encounter (Signed)
Per Angelica Ran, NP pt okay to drive. Pt made aware.

## 2015-02-13 ENCOUNTER — Encounter: Payer: Self-pay | Admitting: Cardiovascular Disease

## 2015-02-13 ENCOUNTER — Encounter (HOSPITAL_COMMUNITY): Payer: Self-pay

## 2015-02-13 NOTE — Telephone Encounter (Signed)
This encounter was created in error - please disregard.

## 2015-02-13 NOTE — Telephone Encounter (Signed)
New Message        Pt cal;ling stating that when she was seen last week she was told to f/u w/ Dr. Burt Knack in 4-8 wks and Dr. Burt Knack doesn't have any available appts. Offered pt an appt w/ PA, pt states that she was told by the PA she saw that she needed to f/u w/ Dr. Burt Knack. Pt wants to know if she will be able to see Dr. Burt Knack or does she need to find another cardiologist. Please call back and advise.

## 2015-02-15 ENCOUNTER — Encounter (HOSPITAL_COMMUNITY): Payer: Self-pay

## 2015-02-16 ENCOUNTER — Encounter (HOSPITAL_COMMUNITY): Payer: Self-pay

## 2015-03-01 ENCOUNTER — Encounter (HOSPITAL_COMMUNITY)
Admission: RE | Admit: 2015-03-01 | Discharge: 2015-03-01 | Disposition: A | Discharge status: Home / Self Care | Payer: Medicare Other | Source: Ambulatory Visit | Attending: Cardiovascular Disease | Admitting: Cardiovascular Disease

## 2015-03-01 DIAGNOSIS — I429 Cardiomyopathy, unspecified: Secondary | ICD-10-CM | POA: Insufficient documentation

## 2015-03-01 DIAGNOSIS — I252 Old myocardial infarction: Secondary | ICD-10-CM | POA: Insufficient documentation

## 2015-03-01 DIAGNOSIS — I059 Rheumatic mitral valve disease, unspecified: Secondary | ICD-10-CM | POA: Insufficient documentation

## 2015-03-01 NOTE — Progress Notes (Signed)
Cardiac Rehab Medication Review by a Pharmacist  Does the patient  feel that his/her medications are working for him/her?  Yes  Has the patient been experiencing any side effects to the medications prescribed?  Yes, but has noticed heart rate in afternoon is 45-55, SBP anywhere from 81-121  Does the patient measure his/her own blood pressure or blood glucose at home?  Yes, BP and HR  Does the patient have any problems obtaining medications due to transportation or finances?   no  Understanding of regimen: excellent Understanding of indications: good Potential of compliance: good    Pharmacist comments: The pt has a great understanding of her medications as she is a retired [employer] scientist. She has been monitoring her BP and HR and has noticed them to be a little low. She has an appointment with her cardiologist within the next week, so I recommended she bring her readings to him so that he can make any necessary adjustments to her medications. No other issues, her medication list was up to date.  Governor Specking, PharmD Clinical Pharmacy Resident Pager: [phone] 03/01/2015 9:16 AM

## 2015-03-05 ENCOUNTER — Encounter (HOSPITAL_COMMUNITY)
Admission: RE | Admit: 2015-03-05 | Discharge: 2015-03-05 | Disposition: A | Discharge status: Home / Self Care | Payer: Medicare Other | Source: Ambulatory Visit | Attending: Cardiovascular Disease | Admitting: Cardiovascular Disease

## 2015-03-05 DIAGNOSIS — I252 Old myocardial infarction: Secondary | ICD-10-CM | POA: Diagnosis not present

## 2015-03-05 DIAGNOSIS — I059 Rheumatic mitral valve disease, unspecified: Secondary | ICD-10-CM | POA: Diagnosis not present

## 2015-03-05 DIAGNOSIS — I429 Cardiomyopathy, unspecified: Secondary | ICD-10-CM | POA: Diagnosis not present

## 2015-03-05 NOTE — Progress Notes (Signed)
Pt in today for her first day of exercise in the cardiac rehab phase II program.  Pt tolerated light exercise without difficulty. VSS with bp in the low to mid 90's and low 100, pt asymptomatic, telemetry-Sr with inverted t wave which was present on pt most recent 12 lead ekg 7/22 .Medication list reconciled. Pt verbalizes that she is not taken her statin due to normal lipid panel readings and non obstructive cardiomyopathy. Pt plans to talk with Dr. Burt Knack on Friday during her follow up appt regarding the statin medication, decreasing bp medications and also beta blocker due to low hr she sometimes gets on her bp cuff.  Asked pt to verify that with a manual check to confirm the reading.  Pt otherwise verbalized compliance with medications and denies barriers to compliance. PSYCHOSOCIAL ASSESSMENT:  PHQ-0. Pt exhibits positive coping skills, hopeful outlook. Although pt score 0 for depression, pt readily admits her heart problems are stress related.  Pt spouse has the beginnings of dementia and this is stressful for pt due to others not being aware of his memory issues.  Pt knows she must deal with stress and work on [employer].  Pt is looking at support group for caregivers.  Pt is also considering taking a class her daughter teaches about yoga and meditation.  Mentioned to pt that one of the education classes that is taught in rehab is on mindfulness and meditation.  Pt is eager to learn techniques. Pt denies the need for additional  psychosocial interventions at this time.  Pt made aware that if circumstances change to please let us know.  Will periodically check in with pt regarding mental well being and intervene as necessary.   Pt cardiac rehab  goal is  to get back into it - exercise. Pt is a prior participant in cardiac rehab maintenance for the last 9 years.  Pt wants to be healthy enough to resume exercise in the maintenance program. Pt encouraged to participate in home exercise and  education classes to increase ability to achieve these goals.   Pt long term cardiac rehab goal is not be admitted in the hospital.  Pt oriented to exercise equipment and routine.  Understanding verbalized. Cherre Huger, BSN

## 2015-03-07 ENCOUNTER — Encounter (HOSPITAL_COMMUNITY)
Admission: RE | Admit: 2015-03-07 | Discharge: 2015-03-07 | Disposition: A | Discharge status: Home / Self Care | Payer: Medicare Other | Source: Ambulatory Visit | Attending: Cardiovascular Disease | Admitting: Cardiovascular Disease

## 2015-03-07 DIAGNOSIS — I059 Rheumatic mitral valve disease, unspecified: Secondary | ICD-10-CM | POA: Diagnosis not present

## 2015-03-09 ENCOUNTER — Encounter: Payer: Self-pay | Admitting: Cardiovascular Disease

## 2015-03-09 ENCOUNTER — Encounter (HOSPITAL_COMMUNITY)
Admission: RE | Admit: 2015-03-09 | Discharge: 2015-03-09 | Disposition: A | Discharge status: Home / Self Care | Payer: Medicare Other | Source: Ambulatory Visit | Attending: Cardiovascular Disease | Admitting: Cardiovascular Disease

## 2015-03-09 ENCOUNTER — Other Ambulatory Visit: Payer: Self-pay | Admitting: Cardiovascular Disease

## 2015-03-09 ENCOUNTER — Ambulatory Visit (INDEPENDENT_AMBULATORY_CARE_PROVIDER_SITE_OTHER): Payer: Medicare Other | Admitting: Cardiovascular Disease

## 2015-03-09 VITALS — BP 110/66 | HR 67 | Ht 67.5 in | Wt 116.8 lb

## 2015-03-09 DIAGNOSIS — I059 Rheumatic mitral valve disease, unspecified: Secondary | ICD-10-CM | POA: Diagnosis not present

## 2015-03-09 DIAGNOSIS — I5181 Takotsubo syndrome: Secondary | ICD-10-CM

## 2015-03-09 MED ORDER — CARVEDILOL 3.125 MG PO TABS: 3.1250 mg | ORAL_TABLET | Freq: Two times a day (BID) | ORAL | Status: DC

## 2015-03-09 NOTE — Progress Notes (Signed)
Cardiology Office Note Date:  03/09/2015   ID:  Kirsten Rice, DOB 09/14/1945, MRN [id number]  PCP:  Sheela Stack, MD  Cardiologist:  Sherren Mocha, MD    Chief Complaint  Patient presents with  . Chest Pain    History of Present Illness: Kirsten Rice is a 69 y.o. female who presents for follow-up evaluation. She has a history of Takotsubo's cardiomyopathy in 2007 related to work-associated stress. She did well until last month when she presented with recurrent chest pain. Her troponin was abnormal and she underwent cardiac catheterization again revealing normal coronary arteries. Her left ventricular ejection fraction, which had been previously normal, was estimated at 25-35%. She was again diagnosed with acute Takotsubo Syndrome. Her hospital stay was otherwise uncomplicated. She has required daily furosemide. Reports no recurrence of shortness of breath or chest pain. Concerned about her ongoing stress level related to her husbands dementia. She brings in blood pressure readings today that range from 81/53-119/65. Heart rates have been running from the mid 40s up to 60 bpm.   Past Medical History  Diagnosis Date  . Takotsubo cardiomyopathy     a. 2007 Cath: nl cors, apical ballooning w/ subsequent recovery of LV fxn;  b. 01/2015 NSTEMI/Cath: nl cors, EF 25-25% w/ apical ballooning;  c. 01/2015 echo: EF 30%, septal, apical, mid anterior, inf HK, mildly dil LA/RA.  Marland Kitchen Raynaud's disease   . Mitral valve prolapse     a. 01/2015 Echo: Ef 25-35%, triv MR.  . Crohn's disease   . Deep vein thrombosis     pt denies: 04/08/13  . Telangiectasia   . Hyperthyroidism     pt denies:  04/08/13  . PONV (postoperative nausea and vomiting) 1970's    "when I had my appendix out"  . Essential hypertension   . Chronic systolic CHF (congestive heart failure)     a. 01/2015 Echo: EF 25-35% in setting of takotsubo cardiomyopathy.  . History of pneumonia     "2-3 times" (01/31/2015)  . Ocular  headache     "weekly" (01/31/2015)  . Arthritis     "probably a little bit in my knees and hips" (01/31/2015)  . Basal cell carcinoma   . Squamous carcinoma   . Persistent dry cough     Past Surgical History  Procedure Laterality Date  . Shoulder arthroscopy w/ rotator cuff repair Right 1996  . Tubal ligation  1973  . Appendectomy  1971  . Colonoscopy    . Colonoscopy  05/11/2012    Procedure: COLONOSCOPY;  Surgeon: Lafayette Dragon, MD;  Location: [facility];  Service: Endoscopy;  Laterality: N/A;  . Cardiac catheterization  2006; 2007  . Basal cell carcinoma excision      forehead, back, legs, arms" (01/31/2015)  . Squamous cell carcinoma excision Left early 1980's    "eyelid"  . Cardiac catheterization N/A 01/31/2015    Procedure: Left Heart Cath and Coronary Angiography;  Surgeon: Jettie Booze, MD;  Location: [facility];  Service: Cardiovascular;  Laterality: N/A;    Current Outpatient Prescriptions  Medication Sig Dispense Refill  . acetaminophen (TYLENOL) 325 MG tablet Take 2 tablets (650 mg total) by mouth every 4 (four) hours as needed for headache or mild pain. (Patient taking differently: Take 650 mg by mouth every 4 (four) hours as needed for headache or mild pain. Pt takes ~once montly)    . albuterol (PROVENTIL HFA;VENTOLIN HFA) 108 (90 BASE) MCG/ACT inhaler Inhale 2 puffs into the  lungs every 6 (six) hours as needed for wheezing or shortness of breath. 1 Inhaler 0  . aspirin 81 MG tablet Take 81 mg by mouth daily.      . balsalazide (COLAZAL) 750 MG capsule Take 2,250 mg by mouth 3 (three) times daily.     . benazepril (LOTENSIN) 10 MG tablet Take 0.5 tablets (5 mg total) by mouth daily. 90 tablet 3  . bifidobacterium infantis (ALIGN) capsule Take 1 capsule by mouth daily.    . Biotin 2500 MCG CAPS Take 2,500 mcg by mouth daily.     . Calcium-Vitamin D 600-200 MG-UNIT per tablet Take 1 tablet by mouth daily.     . clobetasol cream (TEMOVATE) 7.03 % Apply 1  application topically at bedtime as needed (dry skin on hands).   0  . fluticasone (FLONASE) 50 MCG/ACT nasal spray USE 2 SPRAYS IN EACH NOSTRIL DAILY AS NEEDED FOR CONGESTION. 16 g 5  . furosemide (LASIX) 20 MG tablet Take 20 mg by mouth daily. Pt takes 20 mg daily  2  . nitroGLYCERIN (NITROSTAT) 0.4 MG SL tablet Place 1 tablet (0.4 mg total) under the tongue every 5 (five) minutes x 3 doses as needed for chest pain. 25 tablet 2  . ondansetron (ZOFRAN) 4 MG tablet Take 1 tablet (4 mg total) by mouth every 8 (eight) hours as needed for nausea or vomiting. 20 tablet 0  . Polyethyl Glycol-Propyl Glycol (SYSTANE PRESERVATIVE FREE) 0.4-0.3 % SOLN Place 1 Package into both eyes daily as needed (dryness). Only uses PRN    . potassium chloride SA (K-DUR,KLOR-CON) 20 MEQ tablet Take 20 mEq by mouth daily.  0  . PRESCRIPTION MEDICATION Apply 1 application topically daily as needed (rosasea). Finacea Cream, uses PRN    . vitamin C (ASCORBIC ACID) 500 MG tablet Take 500 mg by mouth daily.      . carvedilol (COREG) 3.125 MG tablet TAKE 1 TABLET BY MOUTH TWICE DAILY WITH A MEAL 180 tablet 1   No current facility-administered medications for this visit.    Allergies:   Cafergot; Compazine; Ergotamine-caffeine; Prochlorperazine edisylate; and Tetanus-diphtheria toxoids td   Social History:  The patient  reports that she has never smoked. She has never used smokeless tobacco. She reports that she drinks about 6.0 oz of alcohol per week. She reports that she does not use illicit drugs.   Family History:  The patient's  family history includes Aortic stenosis in her father; Diabetes in her sister and sister; Hypertension in her father and mother; Hyperthyroidism in her sister, sister, sister, and sister. There is no history of Colon cancer.    ROS:  Please see the history of present illness.  Otherwise, review of systems is positive for chest pain, shortness of breath, abdominal pain, back pain, excessive  fatigue, leg pain, nausea/vomiting, anxiety.  All other systems are reviewed and negative.    PHYSICAL EXAM: VS:  BP 110/66 mmHg  Pulse 67  Ht 5' 7.5" (1.715 m)  Wt 116 lb 12.8 oz (52.98 kg)  BMI 18.01 kg/m2  SpO2 97% , BMI Body mass index is 18.01 kg/(m^2). GEN: Well nourished, well developed, in no acute distress HEENT: normal Neck: no JVD, no masses. No carotid bruits Cardiac: RRR without murmur or gallop                Respiratory:  clear to auscultation bilaterally, normal work of breathing GI: soft, nontender, nondistended, + BS MS: no deformity or atrophy Ext: no pretibial edema,  pedal pulses 2+= bilaterally Skin: warm and dry, no rash Neuro:  Strength and sensation are intact Psych: euthymic mood, full affect  EKG:  EKG is not ordered today.  Recent Labs: 01/31/2015: TSH 1.603 02/03/2015: B Natriuretic Peptide 1452.8*; Hemoglobin 12.3; Platelets 273 02/09/2015: BUN 19; Creatinine, Ser 0.92; Potassium 4.3; Sodium 139   Lipid Panel     Component Value Date/Time   CHOL 158 02/01/2015 0332   TRIG 65 02/01/2015 0332   HDL 66 02/01/2015 0332   CHOLHDL 2.4 02/01/2015 0332   VLDL 13 02/01/2015 0332   LDLCALC 79 02/01/2015 0332      Wt Readings from Last 3 Encounters:  03/09/15 116 lb 12.8 oz (52.98 kg)  03/01/15 115 lb 1.3 oz (52.2 kg)  02/09/15 114 lb (51.71 kg)     Cardiac Studies Reviewed: Cardiac Cath 01/31/2015: Conclusion     There is severe left ventricular systolic dysfunction in a pattern of stress induced/Takatsubo cardiomyopathy.  No significant CAD.  Continue medical therapy for left ventricular dysfunction.   CC: Dr. Sherren Mocha.    ASSESSMENT AND PLAN: 1.  Takotsubo's Cardiomyopathy: appears to be recovering well without further symptoms of heart failure or chest pain. Await 2D Echo scheduled for next week and expect to see improvement in LV function. Continue beta-blocker/ACE. Discussed potential lifestyle changes to help with stress  such as Yoga and meditation.  2. Essential HTN: BP too low on current Rx. Advised to reduce carvedilol to 3.125 mg BID.   Current medicines are reviewed with the patient today.  The patient does not have concerns regarding medicines.  Labs/ tests ordered today include:  No orders of the defined types were placed in this encounter.    Disposition:   FU 3 months with Ignacia Bayley, NP and I will see back in 6 months.  Deatra James, MD  03/09/2015 5:43 PM    [address], [place], [place]  [address] Phone: [phone]; Fax: [phone]

## 2015-03-09 NOTE — Patient Instructions (Signed)
Medication Instructions:  Your physician has recommended you make the following change in your medication:  1. DECREASE Carvedilol to 3.125mg  take one by mouth twice a day  Labwork: No new orders.  Testing/Procedures: No new orders.   Follow-Up: Your physician recommends that you schedule a follow-up appointment in: 3 MONTHS with Ignacia Bayley NP  Your physician wants you to follow-up in: 6 MONTHS with Dr Burt Knack.  You will receive a reminder letter in the mail two months in advance. If you don't receive a letter, please call our office to schedule the follow-up appointment.   Any Other Special Instructions Will Be Listed Below (If Applicable).  If your EF is greater than 50% on your ultrasound then you can switch to Maintenance Cardiac Rehabilitation

## 2015-03-12 ENCOUNTER — Other Ambulatory Visit: Payer: Self-pay

## 2015-03-12 ENCOUNTER — Encounter (HOSPITAL_COMMUNITY)
Admission: RE | Admit: 2015-03-12 | Discharge: 2015-03-12 | Disposition: A | Discharge status: Home / Self Care | Payer: Medicare Other | Source: Ambulatory Visit | Attending: Cardiovascular Disease | Admitting: Cardiovascular Disease

## 2015-03-12 ENCOUNTER — Ambulatory Visit (HOSPITAL_COMMUNITY): Payer: Medicare Other | Attending: Internal Medicine

## 2015-03-12 DIAGNOSIS — I340 Nonrheumatic mitral (valve) insufficiency: Secondary | ICD-10-CM | POA: Diagnosis not present

## 2015-03-12 DIAGNOSIS — I5181 Takotsubo syndrome: Secondary | ICD-10-CM

## 2015-03-12 DIAGNOSIS — I10 Essential (primary) hypertension: Secondary | ICD-10-CM | POA: Insufficient documentation

## 2015-03-12 DIAGNOSIS — I059 Rheumatic mitral valve disease, unspecified: Secondary | ICD-10-CM | POA: Diagnosis not present

## 2015-03-14 ENCOUNTER — Encounter (HOSPITAL_COMMUNITY)
Admission: RE | Admit: 2015-03-14 | Discharge: 2015-03-14 | Disposition: A | Discharge status: Home / Self Care | Payer: Medicare Other | Source: Ambulatory Visit | Attending: Cardiovascular Disease | Admitting: Cardiovascular Disease

## 2015-03-14 DIAGNOSIS — I059 Rheumatic mitral valve disease, unspecified: Secondary | ICD-10-CM | POA: Diagnosis not present

## 2015-03-16 ENCOUNTER — Encounter (HOSPITAL_COMMUNITY)
Admission: RE | Admit: 2015-03-16 | Discharge: 2015-03-16 | Disposition: A | Discharge status: Home / Self Care | Payer: Medicare Other | Source: Ambulatory Visit | Attending: Cardiovascular Disease | Admitting: Cardiovascular Disease

## 2015-03-16 DIAGNOSIS — I059 Rheumatic mitral valve disease, unspecified: Secondary | ICD-10-CM | POA: Diagnosis not present

## 2015-03-19 ENCOUNTER — Encounter (HOSPITAL_COMMUNITY)
Admission: RE | Admit: 2015-03-19 | Discharge: 2015-03-19 | Disposition: A | Discharge status: Home / Self Care | Payer: Medicare Other | Source: Ambulatory Visit | Attending: Cardiovascular Disease | Admitting: Cardiovascular Disease

## 2015-03-19 DIAGNOSIS — I059 Rheumatic mitral valve disease, unspecified: Secondary | ICD-10-CM | POA: Diagnosis not present

## 2015-03-21 ENCOUNTER — Encounter (HOSPITAL_COMMUNITY)
Admission: RE | Admit: 2015-03-21 | Discharge: 2015-03-21 | Disposition: A | Discharge status: Home / Self Care | Payer: Medicare Other | Source: Ambulatory Visit | Attending: Cardiovascular Disease | Admitting: Cardiovascular Disease

## 2015-03-21 DIAGNOSIS — I059 Rheumatic mitral valve disease, unspecified: Secondary | ICD-10-CM | POA: Diagnosis not present

## 2015-03-21 NOTE — Progress Notes (Signed)
Pt will discharge today from the phase II cardiac rehab program with the completion of 8 exercise sessions.  Pt plans to transfer back to the cardiac rehab maintenance program as advised by her cardiologist, Dr. Burt Knack on her most recent f/u on 8/19. Pt achieved her goal for getting her heart stronger. Pt with improved EF per echo from 25% to 53%.  Pt is making progress toward her long term goal to decrease stress and not be in the hospital. Pt plans to attend the meditation and mindfulness class today.  Pt is researching support groups for caregivers of dementia/alzhemeirs.  Pt plans to use massage and yoga as stress relievers.  Pt understands the importance of stress management and is making conscious decisions regarding "letting things that are out of her control go".  We look forward to working with this patient again in the maintenance. Kirsten Rice, BSN

## 2015-03-21 NOTE — Progress Notes (Signed)
Reviewed home exercise with pt today.  Pt plans to walk on treadmill and join cardiac rehab maintenance at [facility] for exercise.  Reviewed THR, pulse, RPE, sign and symptoms, NTG use, and when to call 911 or MD.  Pt voiced understanding.    [employer] ACSM RCEP

## 2015-03-23 ENCOUNTER — Encounter (HOSPITAL_COMMUNITY)
Admission: RE | Admit: 2015-03-23 | Discharge: 2015-03-23 | Disposition: A | Discharge status: Home / Self Care | Payer: Self-pay | Source: Ambulatory Visit | Attending: Cardiovascular Disease | Admitting: Cardiovascular Disease

## 2015-03-23 ENCOUNTER — Encounter (HOSPITAL_COMMUNITY): Payer: Medicare Other

## 2015-03-23 DIAGNOSIS — I252 Old myocardial infarction: Secondary | ICD-10-CM | POA: Insufficient documentation

## 2015-03-23 DIAGNOSIS — I5181 Takotsubo syndrome: Secondary | ICD-10-CM | POA: Insufficient documentation

## 2015-03-27 ENCOUNTER — Encounter (HOSPITAL_COMMUNITY)
Admission: RE | Admit: 2015-03-27 | Discharge: 2015-03-27 | Disposition: A | Discharge status: Home / Self Care | Payer: Self-pay | Source: Ambulatory Visit | Attending: Cardiovascular Disease | Admitting: Cardiovascular Disease

## 2015-03-28 ENCOUNTER — Encounter (HOSPITAL_COMMUNITY): Payer: Medicare Other

## 2015-03-29 ENCOUNTER — Encounter (HOSPITAL_COMMUNITY)
Admission: RE | Admit: 2015-03-29 | Discharge: 2015-03-29 | Disposition: A | Discharge status: Home / Self Care | Payer: Self-pay | Source: Ambulatory Visit | Attending: Cardiovascular Disease | Admitting: Cardiovascular Disease

## 2015-03-30 ENCOUNTER — Encounter (HOSPITAL_COMMUNITY): Payer: Medicare Other

## 2015-03-30 ENCOUNTER — Encounter (HOSPITAL_COMMUNITY): Payer: Self-pay

## 2015-04-02 ENCOUNTER — Encounter (HOSPITAL_COMMUNITY): Payer: Medicare Other

## 2015-04-03 ENCOUNTER — Encounter (HOSPITAL_COMMUNITY)
Admission: RE | Admit: 2015-04-03 | Discharge: 2015-04-03 | Disposition: A | Discharge status: Home / Self Care | Payer: Self-pay | Source: Ambulatory Visit | Attending: Cardiovascular Disease | Admitting: Cardiovascular Disease

## 2015-04-04 ENCOUNTER — Encounter (HOSPITAL_COMMUNITY): Payer: Medicare Other

## 2015-04-05 ENCOUNTER — Encounter (HOSPITAL_COMMUNITY)
Admission: RE | Admit: 2015-04-05 | Discharge: 2015-04-05 | Disposition: A | Discharge status: Home / Self Care | Payer: Self-pay | Source: Ambulatory Visit | Attending: Cardiovascular Disease | Admitting: Cardiovascular Disease

## 2015-04-06 ENCOUNTER — Encounter (HOSPITAL_COMMUNITY): Payer: Medicare Other

## 2015-04-06 ENCOUNTER — Encounter (HOSPITAL_COMMUNITY)
Admission: RE | Admit: 2015-04-06 | Discharge: 2015-04-06 | Disposition: A | Discharge status: Home / Self Care | Payer: Self-pay | Source: Ambulatory Visit | Attending: Cardiovascular Disease | Admitting: Cardiovascular Disease

## 2015-04-09 ENCOUNTER — Encounter (HOSPITAL_COMMUNITY): Payer: Medicare Other

## 2015-04-10 ENCOUNTER — Encounter (HOSPITAL_COMMUNITY)
Admission: RE | Admit: 2015-04-10 | Discharge: 2015-04-10 | Disposition: A | Discharge status: Home / Self Care | Payer: Self-pay | Source: Ambulatory Visit | Attending: Cardiovascular Disease | Admitting: Cardiovascular Disease

## 2015-04-11 ENCOUNTER — Encounter (HOSPITAL_COMMUNITY): Payer: Medicare Other

## 2015-04-12 ENCOUNTER — Encounter (HOSPITAL_COMMUNITY): Payer: Self-pay

## 2015-04-13 ENCOUNTER — Encounter (HOSPITAL_COMMUNITY): Payer: Medicare Other

## 2015-04-13 ENCOUNTER — Encounter (HOSPITAL_COMMUNITY): Payer: Self-pay

## 2015-04-16 ENCOUNTER — Encounter (HOSPITAL_COMMUNITY): Payer: Medicare Other

## 2015-04-17 ENCOUNTER — Encounter (HOSPITAL_COMMUNITY): Payer: Self-pay

## 2015-04-18 ENCOUNTER — Encounter (HOSPITAL_COMMUNITY): Payer: Medicare Other

## 2015-04-19 ENCOUNTER — Encounter (HOSPITAL_COMMUNITY): Payer: Self-pay

## 2015-04-20 ENCOUNTER — Encounter (HOSPITAL_COMMUNITY): Payer: Medicare Other

## 2015-04-20 ENCOUNTER — Encounter (HOSPITAL_COMMUNITY)
Admission: RE | Admit: 2015-04-20 | Discharge: 2015-04-20 | Disposition: A | Discharge status: Home / Self Care | Payer: Self-pay | Source: Ambulatory Visit | Attending: Cardiovascular Disease | Admitting: Cardiovascular Disease

## 2015-04-23 ENCOUNTER — Encounter (HOSPITAL_COMMUNITY): Payer: Medicare Other

## 2015-04-24 ENCOUNTER — Encounter (HOSPITAL_COMMUNITY)
Admission: RE | Admit: 2015-04-24 | Discharge: 2015-04-24 | Disposition: A | Discharge status: Home / Self Care | Payer: Self-pay | Source: Ambulatory Visit | Attending: Cardiovascular Disease | Admitting: Cardiovascular Disease

## 2015-04-24 DIAGNOSIS — I252 Old myocardial infarction: Secondary | ICD-10-CM | POA: Insufficient documentation

## 2015-04-24 DIAGNOSIS — I5181 Takotsubo syndrome: Secondary | ICD-10-CM | POA: Insufficient documentation

## 2015-04-25 ENCOUNTER — Encounter (HOSPITAL_COMMUNITY): Payer: Medicare Other

## 2015-04-26 ENCOUNTER — Encounter (HOSPITAL_COMMUNITY)
Admission: RE | Admit: 2015-04-26 | Discharge: 2015-04-26 | Disposition: A | Discharge status: Home / Self Care | Payer: Self-pay | Source: Ambulatory Visit | Attending: Cardiovascular Disease | Admitting: Cardiovascular Disease

## 2015-04-27 ENCOUNTER — Encounter (HOSPITAL_COMMUNITY): Payer: Medicare Other

## 2015-04-27 ENCOUNTER — Encounter (HOSPITAL_COMMUNITY)
Admission: RE | Admit: 2015-04-27 | Discharge: 2015-04-27 | Disposition: A | Discharge status: Home / Self Care | Payer: Self-pay | Source: Ambulatory Visit | Attending: Cardiovascular Disease | Admitting: Cardiovascular Disease

## 2015-04-30 ENCOUNTER — Encounter (HOSPITAL_COMMUNITY): Payer: Medicare Other

## 2015-05-01 ENCOUNTER — Encounter (HOSPITAL_COMMUNITY)
Admission: RE | Admit: 2015-05-01 | Discharge: 2015-05-01 | Disposition: A | Discharge status: Home / Self Care | Payer: Self-pay | Source: Ambulatory Visit | Attending: Cardiovascular Disease | Admitting: Cardiovascular Disease

## 2015-05-02 ENCOUNTER — Encounter (HOSPITAL_COMMUNITY): Payer: Medicare Other

## 2015-05-03 ENCOUNTER — Encounter (HOSPITAL_COMMUNITY)
Admission: RE | Admit: 2015-05-03 | Discharge: 2015-05-03 | Disposition: A | Discharge status: Home / Self Care | Payer: Self-pay | Source: Ambulatory Visit | Attending: Cardiovascular Disease | Admitting: Cardiovascular Disease

## 2015-05-04 ENCOUNTER — Encounter (HOSPITAL_COMMUNITY)
Admission: RE | Admit: 2015-05-04 | Discharge: 2015-05-04 | Disposition: A | Discharge status: Home / Self Care | Payer: Self-pay | Source: Ambulatory Visit | Attending: Cardiovascular Disease | Admitting: Cardiovascular Disease

## 2015-05-04 ENCOUNTER — Encounter (HOSPITAL_COMMUNITY): Payer: Medicare Other

## 2015-05-07 ENCOUNTER — Encounter (HOSPITAL_COMMUNITY): Payer: Medicare Other

## 2015-05-08 ENCOUNTER — Encounter (HOSPITAL_COMMUNITY)
Admission: RE | Admit: 2015-05-08 | Discharge: 2015-05-08 | Disposition: A | Discharge status: Home / Self Care | Payer: Self-pay | Source: Ambulatory Visit | Attending: Cardiovascular Disease | Admitting: Cardiovascular Disease

## 2015-05-09 ENCOUNTER — Encounter: Payer: Self-pay | Admitting: Gastroenterology

## 2015-05-09 ENCOUNTER — Encounter (HOSPITAL_COMMUNITY): Payer: Medicare Other

## 2015-05-10 ENCOUNTER — Encounter (HOSPITAL_COMMUNITY)
Admission: RE | Admit: 2015-05-10 | Discharge: 2015-05-10 | Disposition: A | Discharge status: Home / Self Care | Payer: Self-pay | Source: Ambulatory Visit | Attending: Cardiovascular Disease | Admitting: Cardiovascular Disease

## 2015-05-11 ENCOUNTER — Encounter (HOSPITAL_COMMUNITY): Payer: Medicare Other

## 2015-05-11 ENCOUNTER — Encounter (HOSPITAL_COMMUNITY)
Admission: RE | Admit: 2015-05-11 | Discharge: 2015-05-11 | Disposition: A | Discharge status: Home / Self Care | Payer: Self-pay | Source: Ambulatory Visit | Attending: Cardiovascular Disease | Admitting: Cardiovascular Disease

## 2015-05-14 ENCOUNTER — Encounter (HOSPITAL_COMMUNITY): Payer: Medicare Other

## 2015-05-15 ENCOUNTER — Encounter (HOSPITAL_COMMUNITY)
Admission: RE | Admit: 2015-05-15 | Discharge: 2015-05-15 | Disposition: A | Discharge status: Home / Self Care | Payer: Self-pay | Source: Ambulatory Visit | Attending: Cardiovascular Disease | Admitting: Cardiovascular Disease

## 2015-05-15 ENCOUNTER — Telehealth: Payer: Self-pay | Admitting: Cardiovascular Disease

## 2015-05-15 NOTE — Telephone Encounter (Signed)
I will forward this message to Dr Cooper for review.  

## 2015-05-15 NOTE — Telephone Encounter (Signed)
New message      Calling to see if Dr Burt Knack will write a letter to excuse pt from jury duty.  Please call

## 2015-05-16 ENCOUNTER — Encounter (HOSPITAL_COMMUNITY): Payer: Medicare Other

## 2015-05-16 ENCOUNTER — Encounter: Payer: Self-pay | Admitting: Cardiovascular Disease

## 2015-05-16 NOTE — Telephone Encounter (Signed)
I spoke with the pt and made her aware that Dr Burt Knack has dictated a letter in regards to jury duty. She would like this mailed to her home. She is also due to see Ignacia Bayley NP in November but he will not be in the office that month.  I have scheduled her to follow-up with Richardson Dopp PA-C.

## 2015-05-16 NOTE — Telephone Encounter (Signed)
done

## 2015-05-17 ENCOUNTER — Encounter (HOSPITAL_COMMUNITY)
Admission: RE | Admit: 2015-05-17 | Discharge: 2015-05-17 | Disposition: A | Discharge status: Home / Self Care | Payer: Self-pay | Source: Ambulatory Visit | Attending: Cardiovascular Disease | Admitting: Cardiovascular Disease

## 2015-05-18 ENCOUNTER — Encounter (HOSPITAL_COMMUNITY): Payer: Medicare Other

## 2015-05-18 ENCOUNTER — Encounter (HOSPITAL_COMMUNITY)
Admission: RE | Admit: 2015-05-18 | Discharge: 2015-05-18 | Disposition: A | Discharge status: Home / Self Care | Payer: Self-pay | Source: Ambulatory Visit | Attending: Cardiovascular Disease | Admitting: Cardiovascular Disease

## 2015-05-21 ENCOUNTER — Encounter (HOSPITAL_COMMUNITY): Payer: Medicare Other

## 2015-05-22 ENCOUNTER — Encounter (HOSPITAL_COMMUNITY)
Admission: RE | Admit: 2015-05-22 | Discharge: 2015-05-22 | Disposition: A | Discharge status: Home / Self Care | Payer: Self-pay | Source: Ambulatory Visit | Attending: Cardiovascular Disease | Admitting: Cardiovascular Disease

## 2015-05-22 DIAGNOSIS — I252 Old myocardial infarction: Secondary | ICD-10-CM | POA: Insufficient documentation

## 2015-05-22 DIAGNOSIS — I5181 Takotsubo syndrome: Secondary | ICD-10-CM | POA: Insufficient documentation

## 2015-05-23 ENCOUNTER — Encounter (HOSPITAL_COMMUNITY): Payer: Medicare Other

## 2015-05-23 DIAGNOSIS — I059 Rheumatic mitral valve disease, unspecified: Secondary | ICD-10-CM | POA: Insufficient documentation

## 2015-05-23 DIAGNOSIS — I252 Old myocardial infarction: Secondary | ICD-10-CM | POA: Insufficient documentation

## 2015-05-23 DIAGNOSIS — I429 Cardiomyopathy, unspecified: Secondary | ICD-10-CM | POA: Insufficient documentation

## 2015-05-24 ENCOUNTER — Encounter (HOSPITAL_COMMUNITY)
Admission: RE | Admit: 2015-05-24 | Discharge: 2015-05-24 | Disposition: A | Discharge status: Home / Self Care | Payer: Self-pay | Source: Ambulatory Visit | Attending: Cardiovascular Disease | Admitting: Cardiovascular Disease

## 2015-05-25 ENCOUNTER — Encounter (HOSPITAL_COMMUNITY)
Admission: RE | Admit: 2015-05-25 | Discharge: 2015-05-25 | Disposition: A | Discharge status: Home / Self Care | Payer: Self-pay | Source: Ambulatory Visit | Attending: Cardiovascular Disease | Admitting: Cardiovascular Disease

## 2015-05-25 ENCOUNTER — Encounter (HOSPITAL_COMMUNITY): Payer: Self-pay

## 2015-05-28 ENCOUNTER — Encounter (HOSPITAL_COMMUNITY): Payer: Self-pay

## 2015-05-29 ENCOUNTER — Encounter (HOSPITAL_COMMUNITY): Payer: Self-pay

## 2015-05-30 ENCOUNTER — Encounter (HOSPITAL_COMMUNITY): Payer: Self-pay

## 2015-05-30 ENCOUNTER — Telehealth: Payer: Self-pay | Admitting: Internal Medicine

## 2015-05-30 DIAGNOSIS — R918 Other nonspecific abnormal finding of lung field: Secondary | ICD-10-CM

## 2015-05-30 NOTE — Telephone Encounter (Signed)
Patient calling, says that she is supposed to have CT done in November.  Patient had last CT on June 12, 2014.  Per Dr. Golden Pop last [place] note, pt needs follow up CT done in November.  Order entered.  Patient notified that she will receive call to schedule CT.  Nothing further needed. Closing encounter

## 2015-05-31 ENCOUNTER — Encounter (HOSPITAL_COMMUNITY): Payer: Self-pay

## 2015-06-01 ENCOUNTER — Encounter (HOSPITAL_COMMUNITY): Payer: Self-pay

## 2015-06-04 ENCOUNTER — Encounter (HOSPITAL_COMMUNITY): Payer: Self-pay

## 2015-06-05 ENCOUNTER — Encounter (HOSPITAL_COMMUNITY)
Admission: RE | Admit: 2015-06-05 | Discharge: 2015-06-05 | Disposition: A | Discharge status: Home / Self Care | Payer: Self-pay | Source: Ambulatory Visit | Attending: Cardiovascular Disease | Admitting: Cardiovascular Disease

## 2015-06-06 ENCOUNTER — Encounter (HOSPITAL_COMMUNITY): Payer: Self-pay

## 2015-06-07 ENCOUNTER — Encounter (HOSPITAL_COMMUNITY)
Admission: RE | Admit: 2015-06-07 | Discharge: 2015-06-07 | Disposition: A | Discharge status: Home / Self Care | Payer: Self-pay | Source: Ambulatory Visit | Attending: Cardiovascular Disease | Admitting: Cardiovascular Disease

## 2015-06-08 ENCOUNTER — Encounter (HOSPITAL_COMMUNITY)
Admission: RE | Admit: 2015-06-08 | Discharge: 2015-06-08 | Disposition: A | Discharge status: Home / Self Care | Payer: Self-pay | Source: Ambulatory Visit | Attending: Cardiovascular Disease | Admitting: Cardiovascular Disease

## 2015-06-08 ENCOUNTER — Encounter (HOSPITAL_COMMUNITY): Payer: Self-pay

## 2015-06-10 NOTE — Progress Notes (Signed)
Cardiology Office Note   Date:  06/11/2015   ID:  [name], [name] 02-27-1946, MRN [id number]   Patient Care Team: Reynold Bowen, MD as PCP - General (Endocrinology) Reynold Bowen, MD as Consulting Physician (Endocrinology) Sherren Mocha, MD as Consulting Physician (Cardiology)    Chief Complaint  Patient presents with  . Follow-up  . 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  She was dx with Tako-Tsubo CM in 2007 with recovered LVF.  She was admitted in 7/16 with chest pain and elevated Troponin level.  LHC demonstrated normal coronary arteries and apical ballooning with an EF of 25-35%.  Last seen by Dr. Sherren Mocha in 8/16.  FU echo demonstrated improved LVF with normal EF.  She returns for FU.    She is doing well. The patient denies chest pain, shortness of breath, syncope, orthopnea, PND or significant pedal edema. She is most bothered by symptoms of Raynaud's.   Studies/Reports Reviewed Today:  Echo 03/12/15 EF 55-60%, mild MR  LHC 01/31/15 There is severe left ventricular systolic dysfunction in a pattern of stress induced/Takatsubo cardiomyopathy. No significant CAD. EF 25-35% with apical ballooning   Past Medical History  Diagnosis Date  . Takotsubo cardiomyopathy     a. 2007 Cath: nl cors, apical ballooning w/ subsequent recovery of LV fxn;  b. 01/2015 NSTEMI/Cath: nl cors, EF 25-25% w/ apical ballooning;  c. 01/2015 echo: EF 30%, septal, apical, mid anterior, inf HK, mildly dil LA/RA.  Marland Kitchen Raynaud's disease   . Mitral valve prolapse     a. 01/2015 Echo: Ef 25-35%, triv MR.  . Crohn's disease ([place])   . Deep vein thrombosis ([place])     pt denies: 04/08/13  . Telangiectasia   . Hyperthyroidism     pt denies:  04/08/13  . PONV (postoperative nausea and vomiting) 1970's    "when I had my appendix out"  . Essential hypertension   .  Chronic systolic CHF (congestive heart failure) ([place])     a. 01/2015 Echo: EF 25-35% in setting of takotsubo cardiomyopathy.  . History of pneumonia     "2-3 times" (01/31/2015)  . Ocular headache     "weekly" (01/31/2015)  . Arthritis     "probably a little bit in my knees and hips" (01/31/2015)  . Basal cell carcinoma   . Squamous carcinoma ([place])   . Persistent dry cough     Past Surgical History  Procedure Laterality Date  . Shoulder arthroscopy w/ rotator cuff repair Right 1996  . Tubal ligation  1973  . Appendectomy  1971  . Colonoscopy    . Colonoscopy  05/11/2012    Procedure: COLONOSCOPY;  Surgeon: Lafayette Dragon, MD;  Location: [facility];  Service: Endoscopy;  Laterality: N/A;  . Cardiac catheterization  2006; 2007  . Basal cell carcinoma excision      forehead, back, legs, arms" (01/31/2015)  . Squamous cell carcinoma excision Left early 1980's    "eyelid"  . Cardiac catheterization N/A 01/31/2015    Procedure: Left Heart Cath and Coronary Angiography;  Surgeon: Jettie Booze, MD;  Location: [facility];  Service: Cardiovascular;  Laterality: N/A;     Current Outpatient Prescriptions  Medication Sig Dispense Refill  . acetaminophen (TYLENOL) 325 MG tablet Take 2 tablets (650 mg total) by mouth every 4 (four) hours as  needed for headache or mild pain. (Patient taking differently: Take 650 mg by mouth every 4 (four) hours as needed for headache or mild pain. Pt takes ~once montly)    . albuterol (PROVENTIL HFA;VENTOLIN HFA) 108 (90 BASE) MCG/ACT inhaler Inhale 2 puffs into the lungs every 6 (six) hours as needed for wheezing or shortness of breath. 1 Inhaler 0  . aspirin 81 MG tablet Take 81 mg by mouth daily.      . balsalazide (COLAZAL) 750 MG capsule Take 2,250 mg by mouth 3 (three) times daily.     . benazepril (LOTENSIN) 10 MG tablet Take 0.5 tablets (5 mg total) by mouth daily. 90 tablet 3  . bifidobacterium infantis (ALIGN) capsule Take 1 capsule by mouth  daily.    . Biotin 2500 MCG CAPS Take 2,500 mcg by mouth daily.     . Calcium-Vitamin D 600-200 MG-UNIT per tablet Take 1 tablet by mouth daily.     . carvedilol (COREG) 3.125 MG tablet TAKE 1 TABLET BY MOUTH TWICE DAILY WITH A MEAL 180 tablet 1  . clobetasol cream (TEMOVATE) [id number] % Apply 1 application topically at bedtime as needed (dry skin on hands).   0  . fluticasone (FLONASE) 50 MCG/ACT nasal spray USE 2 SPRAYS IN EACH NOSTRIL DAILY AS NEEDED FOR CONGESTION. 16 g 5  . furosemide (LASIX) 20 MG tablet Take 20 mg by mouth daily. Pt takes 20 mg daily  2  . nitroGLYCERIN (NITROSTAT) 0.4 MG SL tablet Place 1 tablet (0.4 mg total) under the tongue every 5 (five) minutes x 3 doses as needed for chest pain. 25 tablet 2  . ondansetron (ZOFRAN) 4 MG tablet Take 1 tablet (4 mg total) by mouth every 8 (eight) hours as needed for nausea or vomiting. 20 tablet 0  . Polyethyl Glycol-Propyl Glycol (SYSTANE PRESERVATIVE FREE) 0.4-0.3 % SOLN Place 1 Package into both eyes daily as needed (dryness). Only uses PRN    . potassium chloride SA (K-DUR,KLOR-CON) 20 MEQ tablet Take 20 mEq by mouth daily.  0  . PRESCRIPTION MEDICATION Apply 1 application topically daily as needed (rosasea). Finacea Cream, uses PRN    . vitamin C (ASCORBIC ACID) 500 MG tablet Take 500 mg by mouth daily.      . RESTASIS 0.05 % ophthalmic emulsion Place 1 drop into both eyes 2 (two) times daily.   3   No current facility-administered medications for this visit.    Allergies:   Cafergot; Compazine; Ergotamine-caffeine; Prochlorperazine edisylate; and Tetanus-diphtheria toxoids td    Social History:   Social History   Social History  . Marital Status: Married    Spouse Name: N/A  . Number of Children: N/A  . Years of Education: N/A   Occupational History  . Retired     Marine scientist   Social History Main Topics  . Smoking status: Never Smoker   . Smokeless tobacco: Never Used  . Alcohol Use: 6.0 oz/week    10 Glasses of wine per  week  . Drug Use: No  . Sexual Activity: No   Other Topics Concern  . None   Social History Narrative     Family History:   Family History  Problem Relation Age of Onset  . Aortic stenosis Father   . Hypertension Father   . Colon cancer Neg Hx   . Diabetes Sister   . Hyperthyroidism Sister   . Diabetes Sister   . Hyperthyroidism Sister   . Hypertension Mother   . Hyperthyroidism  Sister   . Hyperthyroidism Sister       ROS:   Please see the history of present illness.   Review of Systems  HENT: Positive for headaches.   Eyes: Positive for visual disturbance.  Respiratory: Positive for cough.   All other systems reviewed and are negative.     PHYSICAL EXAM: VS:  BP 128/80 mmHg  Pulse 61  Ht 5' 7.5" (1.715 m)  Wt 119 lb (53.978 kg)  BMI 18.35 kg/m2    Wt Readings from Last 3 Encounters:  06/11/15 119 lb (53.978 kg)  03/09/15 116 lb 12.8 oz (52.98 kg)  03/01/15 115 lb 1.3 oz (52.2 kg)     GEN: Well nourished, well developed, in no acute distress HEENT: normal Neck: no JVD,   no masses Cardiac:  Normal S1/S2, RRR; no murmur ,  no rubs or gallops, no edema   Respiratory:  clear to auscultation bilaterally, no wheezing, rhonchi or rales. GI: soft, nontender, nondistended, + BS MS: no deformity or atrophy Skin: warm and dry  Neuro:  CNs II-XII intact, Strength and sensation are intact Psych: Normal affect   EKG:  EKG is ordered today.  It demonstrates:   NSR, HR 61, normal axis, QTc 414 ms   Recent Labs: 01/31/2015: TSH 1.603 02/03/2015: B Natriuretic Peptide 1452.8*; Hemoglobin 12.3; Platelets 273 02/09/2015: BUN 19; Creatinine, Ser 0.92; Potassium 4.3; Sodium 139    Lipid Panel    Component Value Date/Time   CHOL 158 02/01/2015 0332   TRIG 65 02/01/2015 0332   HDL 66 02/01/2015 0332   CHOLHDL 2.4 02/01/2015 0332   VLDL 13 02/01/2015 0332   LDLCALC 79 02/01/2015 0332      ASSESSMENT AND PLAN:  1. Tako-Tsubo Cardiomyopathy:  She has a hx of  recurrent stress induced CM.  Echo in 8/16 demonstrated improved LVF.  She notes some issues with her Raynaud's  We discussed changing Carvedilol to Metoprolol Succinate to see if this would help.  She prefers to hold off for now.  She can call if she changes her mind.  Continue beta-blocker, ACE inhibitor.   2. HTN:  Controlled.      Medication Changes: Current medicines are reviewed at length with the patient today.  Concerns regarding medicines are as outlined above.  The following changes have been made:   Discontinued Medications   No medications on file   Modified Medications   No medications on file   New Prescriptions   No medications on file   Labs/ tests ordered today include:   Orders Placed This Encounter  Procedures  . EKG 12-Lead     Disposition:    FU with Dr. Sherren Mocha as planned in 3 mos.     Signed, Kirsten Rice, MHS 06/11/2015 9:00 AM    [facility] [address], [place], [place]  [address] Phone: [phone]; Fax: [phone]

## 2015-06-11 ENCOUNTER — Encounter: Payer: Self-pay | Admitting: Cardiovascular Disease

## 2015-06-11 ENCOUNTER — Encounter: Payer: Self-pay | Admitting: Physician Assistant

## 2015-06-11 ENCOUNTER — Ambulatory Visit (INDEPENDENT_AMBULATORY_CARE_PROVIDER_SITE_OTHER): Payer: Medicare Other | Admitting: Physician Assistant

## 2015-06-11 VITALS — BP 128/80 | HR 61 | Ht 67.5 in | Wt 119.0 lb

## 2015-06-11 DIAGNOSIS — I10 Essential (primary) hypertension: Secondary | ICD-10-CM | POA: Diagnosis not present

## 2015-06-11 DIAGNOSIS — I5181 Takotsubo syndrome: Secondary | ICD-10-CM

## 2015-06-11 NOTE — Patient Instructions (Addendum)
Medication Instructions:  Your physician recommends that you continue on your current medications as directed. Please refer to the Current Medication list given to you today.   Labwork: NONE Testing/Procedures: NONE  Follow-Up: 08/2015 WITH DR. Burt Knack  Any Other Special Instructions Will Be Listed Below (If Applicable).   If you need a refill on your cardiac medications before your next appointment, please call your pharmacy.

## 2015-06-12 ENCOUNTER — Encounter (HOSPITAL_COMMUNITY)
Admission: RE | Admit: 2015-06-12 | Discharge: 2015-06-12 | Disposition: A | Discharge status: Home / Self Care | Payer: Self-pay | Source: Ambulatory Visit | Attending: Cardiovascular Disease | Admitting: Cardiovascular Disease

## 2015-06-18 ENCOUNTER — Telehealth: Payer: Self-pay | Admitting: Internal Medicine

## 2015-06-18 ENCOUNTER — Ambulatory Visit (INDEPENDENT_AMBULATORY_CARE_PROVIDER_SITE_OTHER)
Admission: RE | Admit: 2015-06-18 | Discharge: 2015-06-18 | Disposition: A | Discharge status: Home / Self Care | Payer: Medicare Other | Source: Ambulatory Visit | Attending: Internal Medicine | Admitting: Internal Medicine

## 2015-06-18 DIAGNOSIS — R918 Other nonspecific abnormal finding of lung field: Secondary | ICD-10-CM

## 2015-06-18 NOTE — Telephone Encounter (Signed)
No change in CT chest in 1 year - let her know   Ct Chest Wo Contrast  06/18/2015  CLINICAL DATA:  Followup pulmonary infiltrate. EXAM: CT CHEST WITHOUT CONTRAST TECHNIQUE: Multidetector CT imaging of the chest was performed following the standard protocol without IV contrast. COMPARISON:  Chest radiograph 02/03/2015 and CT chest 06/12/2014. FINDINGS: Mediastinum/Nodes: No pathologically enlarged mediastinal or axillary lymph nodes. Hilar regions are difficult to definitively evaluate without IV contrast but appear grossly unremarkable. Heart size normal. No pericardial effusion. Lungs/Pleura: Minimal biapical pleural parenchymal scarring. Scattered peribronchovascular nodularity and mucoid impaction, similar to the prior exam. Linear scarring in the left lower lobe. No pleural fluid. Airway is unremarkable. Upper abdomen: Visualized portions of the liver and adrenal glands are unremarkable. A 9 mm hyperdense lesion off the upper pole right kidney is difficult to further characterize due to size and lack of postcontrast imaging. Lesion is likely stable from 06/12/2014. Visualized portions of the adrenal glands, kidneys, spleen, pancreas and stomach are grossly unremarkable. Musculoskeletal: No worrisome lytic or sclerotic lesions. Pectus deformity. IMPRESSION: Scattered peribronchovascular nodularity and mucoid impaction, unchanged. Findings may be due to post infectious scarring or mycobacterium avium complex. Electronically Signed   By: Lorin Picket M.D.   On: 06/18/2015 14:26      IMPRESSION: Scattered peribronchovascular nodularity and mucoid impaction, unchanged. Findings may be due to post infectious scarring or mycobacterium avium complex.   Electronically Signed By: Lorin Picket M.D. On: 06/18/2015 14:26

## 2015-06-19 ENCOUNTER — Encounter (HOSPITAL_COMMUNITY): Payer: Self-pay

## 2015-06-19 NOTE — Telephone Encounter (Signed)
lmtcb X1 for pt  

## 2015-06-20 NOTE — Telephone Encounter (Signed)
I spoke with patient about results and she verbalized understanding. She wants to know if she needs to f/u with [place] since CT has not changed. Please advise thanks

## 2015-06-20 NOTE — Telephone Encounter (Signed)
[id number], pt cb

## 2015-06-20 NOTE — Telephone Encounter (Signed)
lmtcb for pt.  

## 2015-06-20 NOTE — Telephone Encounter (Signed)
Do repeat ct chest wo contrast nov 2017 for 2nd year ct - pulmnary infitlrates and return at that time

## 2015-06-21 ENCOUNTER — Encounter (HOSPITAL_COMMUNITY)
Admission: RE | Admit: 2015-06-21 | Discharge: 2015-06-21 | Disposition: A | Discharge status: Home / Self Care | Payer: Self-pay | Source: Ambulatory Visit | Attending: Cardiovascular Disease | Admitting: Cardiovascular Disease

## 2015-06-21 DIAGNOSIS — I252 Old myocardial infarction: Secondary | ICD-10-CM | POA: Insufficient documentation

## 2015-06-21 DIAGNOSIS — I5181 Takotsubo syndrome: Secondary | ICD-10-CM | POA: Insufficient documentation

## 2015-06-21 NOTE — Telephone Encounter (Signed)
Pt aware of rec's per MR.  CT ordered for Nov 2017 and appt reminder placed Nov 2017 Nothing further needed.

## 2015-06-22 ENCOUNTER — Encounter (HOSPITAL_COMMUNITY)
Admission: RE | Admit: 2015-06-22 | Discharge: 2015-06-22 | Disposition: A | Discharge status: Home / Self Care | Payer: Self-pay | Source: Ambulatory Visit | Attending: Cardiovascular Disease | Admitting: Cardiovascular Disease

## 2015-06-25 ENCOUNTER — Telehealth: Payer: Self-pay | Admitting: Gastroenterology

## 2015-06-26 ENCOUNTER — Encounter (HOSPITAL_COMMUNITY)
Admission: RE | Admit: 2015-06-26 | Discharge: 2015-06-26 | Disposition: A | Discharge status: Home / Self Care | Payer: Self-pay | Source: Ambulatory Visit | Attending: Cardiovascular Disease | Admitting: Cardiovascular Disease

## 2015-06-28 ENCOUNTER — Encounter (HOSPITAL_COMMUNITY)
Admission: RE | Admit: 2015-06-28 | Discharge: 2015-06-28 | Disposition: A | Discharge status: Home / Self Care | Payer: Self-pay | Source: Ambulatory Visit | Attending: Cardiovascular Disease | Admitting: Cardiovascular Disease

## 2015-06-29 ENCOUNTER — Encounter (HOSPITAL_COMMUNITY)
Admission: RE | Admit: 2015-06-29 | Discharge: 2015-06-29 | Disposition: A | Discharge status: Home / Self Care | Payer: Self-pay | Source: Ambulatory Visit | Attending: Cardiovascular Disease | Admitting: Cardiovascular Disease

## 2015-06-30 ENCOUNTER — Other Ambulatory Visit: Payer: Self-pay | Admitting: Cardiovascular Disease

## 2015-07-03 ENCOUNTER — Encounter (HOSPITAL_COMMUNITY)
Admission: RE | Admit: 2015-07-03 | Discharge: 2015-07-03 | Disposition: A | Discharge status: Home / Self Care | Payer: Self-pay | Source: Ambulatory Visit | Attending: Cardiovascular Disease | Admitting: Cardiovascular Disease

## 2015-07-05 ENCOUNTER — Encounter (HOSPITAL_COMMUNITY)
Admission: RE | Admit: 2015-07-05 | Discharge: 2015-07-05 | Disposition: A | Discharge status: Home / Self Care | Payer: Self-pay | Source: Ambulatory Visit | Attending: Cardiovascular Disease | Admitting: Cardiovascular Disease

## 2015-07-06 ENCOUNTER — Encounter (HOSPITAL_COMMUNITY)
Admission: RE | Admit: 2015-07-06 | Discharge: 2015-07-06 | Disposition: A | Discharge status: Home / Self Care | Payer: Self-pay | Source: Ambulatory Visit | Attending: Cardiovascular Disease | Admitting: Cardiovascular Disease

## 2015-07-10 ENCOUNTER — Encounter (HOSPITAL_COMMUNITY)
Admission: RE | Admit: 2015-07-10 | Discharge: 2015-07-10 | Disposition: A | Discharge status: Home / Self Care | Payer: Self-pay | Source: Ambulatory Visit | Attending: Cardiovascular Disease | Admitting: Cardiovascular Disease

## 2015-07-12 ENCOUNTER — Encounter (HOSPITAL_COMMUNITY)
Admission: RE | Admit: 2015-07-12 | Discharge: 2015-07-12 | Disposition: A | Discharge status: Home / Self Care | Payer: Self-pay | Source: Ambulatory Visit | Attending: Cardiovascular Disease | Admitting: Cardiovascular Disease

## 2015-07-13 ENCOUNTER — Encounter (HOSPITAL_COMMUNITY)
Admission: RE | Admit: 2015-07-13 | Discharge: 2015-07-13 | Disposition: A | Discharge status: Home / Self Care | Payer: Self-pay | Source: Ambulatory Visit | Attending: Cardiovascular Disease | Admitting: Cardiovascular Disease

## 2015-07-17 ENCOUNTER — Encounter (HOSPITAL_COMMUNITY)
Admission: RE | Admit: 2015-07-17 | Discharge: 2015-07-17 | Disposition: A | Discharge status: Home / Self Care | Payer: Self-pay | Source: Ambulatory Visit | Attending: Cardiovascular Disease | Admitting: Cardiovascular Disease

## 2015-07-19 ENCOUNTER — Encounter (HOSPITAL_COMMUNITY)
Admission: RE | Admit: 2015-07-19 | Discharge: 2015-07-19 | Disposition: A | Discharge status: Home / Self Care | Payer: Self-pay | Source: Ambulatory Visit | Attending: Cardiovascular Disease | Admitting: Cardiovascular Disease

## 2015-07-20 ENCOUNTER — Other Ambulatory Visit: Payer: Self-pay | Admitting: Cardiovascular Disease

## 2015-07-20 ENCOUNTER — Encounter (HOSPITAL_COMMUNITY)
Admission: RE | Admit: 2015-07-20 | Discharge: 2015-07-20 | Disposition: A | Discharge status: Home / Self Care | Payer: Self-pay | Source: Ambulatory Visit | Attending: Cardiovascular Disease | Admitting: Cardiovascular Disease

## 2015-07-24 ENCOUNTER — Encounter (HOSPITAL_COMMUNITY)
Admission: RE | Admit: 2015-07-24 | Discharge: 2015-07-24 | Disposition: A | Discharge status: Home / Self Care | Payer: Self-pay | Source: Ambulatory Visit | Attending: Cardiovascular Disease | Admitting: Cardiovascular Disease

## 2015-07-24 DIAGNOSIS — I5181 Takotsubo syndrome: Secondary | ICD-10-CM | POA: Insufficient documentation

## 2015-07-24 DIAGNOSIS — I252 Old myocardial infarction: Secondary | ICD-10-CM | POA: Insufficient documentation

## 2015-07-26 ENCOUNTER — Encounter (HOSPITAL_COMMUNITY)
Admission: RE | Admit: 2015-07-26 | Discharge: 2015-07-26 | Disposition: A | Discharge status: Home / Self Care | Payer: Self-pay | Source: Ambulatory Visit | Attending: Cardiovascular Disease | Admitting: Cardiovascular Disease

## 2015-07-27 ENCOUNTER — Encounter (HOSPITAL_COMMUNITY)
Admission: RE | Admit: 2015-07-27 | Discharge: 2015-07-27 | Disposition: A | Discharge status: Home / Self Care | Payer: Self-pay | Source: Ambulatory Visit | Attending: Cardiovascular Disease | Admitting: Cardiovascular Disease

## 2015-07-31 ENCOUNTER — Encounter (HOSPITAL_COMMUNITY): Payer: Self-pay

## 2015-08-02 ENCOUNTER — Encounter (HOSPITAL_COMMUNITY)
Admission: RE | Admit: 2015-08-02 | Discharge: 2015-08-02 | Disposition: A | Discharge status: Home / Self Care | Payer: Self-pay | Source: Ambulatory Visit | Attending: Cardiovascular Disease | Admitting: Cardiovascular Disease

## 2015-08-03 ENCOUNTER — Encounter (HOSPITAL_COMMUNITY)
Admission: RE | Admit: 2015-08-03 | Discharge: 2015-08-03 | Disposition: A | Discharge status: Home / Self Care | Payer: Self-pay | Source: Ambulatory Visit | Attending: Cardiovascular Disease | Admitting: Cardiovascular Disease

## 2015-08-07 ENCOUNTER — Encounter (HOSPITAL_COMMUNITY)
Admission: RE | Admit: 2015-08-07 | Discharge: 2015-08-07 | Disposition: A | Discharge status: Home / Self Care | Payer: Self-pay | Source: Ambulatory Visit | Attending: Cardiovascular Disease | Admitting: Cardiovascular Disease

## 2015-08-09 ENCOUNTER — Encounter (HOSPITAL_COMMUNITY)
Admission: RE | Admit: 2015-08-09 | Discharge: 2015-08-09 | Disposition: A | Discharge status: Home / Self Care | Payer: Self-pay | Source: Ambulatory Visit | Attending: Cardiovascular Disease | Admitting: Cardiovascular Disease

## 2015-08-10 ENCOUNTER — Encounter (HOSPITAL_COMMUNITY)
Admission: RE | Admit: 2015-08-10 | Discharge: 2015-08-10 | Disposition: A | Discharge status: Home / Self Care | Payer: Self-pay | Source: Ambulatory Visit | Attending: Cardiovascular Disease | Admitting: Cardiovascular Disease

## 2015-08-14 ENCOUNTER — Encounter (HOSPITAL_COMMUNITY)
Admission: RE | Admit: 2015-08-14 | Discharge: 2015-08-14 | Disposition: A | Discharge status: Home / Self Care | Payer: Self-pay | Source: Ambulatory Visit | Attending: Cardiovascular Disease | Admitting: Cardiovascular Disease

## 2015-08-16 ENCOUNTER — Encounter (HOSPITAL_COMMUNITY)
Admission: RE | Admit: 2015-08-16 | Discharge: 2015-08-16 | Disposition: A | Discharge status: Home / Self Care | Payer: Self-pay | Source: Ambulatory Visit | Attending: Cardiovascular Disease | Admitting: Cardiovascular Disease

## 2015-08-17 ENCOUNTER — Encounter (HOSPITAL_COMMUNITY)
Admission: RE | Admit: 2015-08-17 | Discharge: 2015-08-17 | Disposition: A | Discharge status: Home / Self Care | Payer: Self-pay | Source: Ambulatory Visit | Attending: Cardiovascular Disease | Admitting: Cardiovascular Disease

## 2015-08-21 ENCOUNTER — Encounter (HOSPITAL_COMMUNITY)
Admission: RE | Admit: 2015-08-21 | Discharge: 2015-08-21 | Disposition: A | Discharge status: Home / Self Care | Payer: Self-pay | Source: Ambulatory Visit | Attending: Cardiovascular Disease | Admitting: Cardiovascular Disease

## 2015-08-23 ENCOUNTER — Encounter (HOSPITAL_COMMUNITY)
Admission: RE | Admit: 2015-08-23 | Discharge: 2015-08-23 | Disposition: A | Discharge status: Home / Self Care | Payer: Self-pay | Source: Ambulatory Visit | Attending: Cardiovascular Disease | Admitting: Cardiovascular Disease

## 2015-08-23 DIAGNOSIS — I5181 Takotsubo syndrome: Secondary | ICD-10-CM | POA: Insufficient documentation

## 2015-08-23 DIAGNOSIS — I252 Old myocardial infarction: Secondary | ICD-10-CM | POA: Insufficient documentation

## 2015-08-24 ENCOUNTER — Encounter (HOSPITAL_COMMUNITY)
Admission: RE | Admit: 2015-08-24 | Discharge: 2015-08-24 | Disposition: A | Discharge status: Home / Self Care | Payer: Self-pay | Source: Ambulatory Visit | Attending: Cardiovascular Disease | Admitting: Cardiovascular Disease

## 2015-08-28 ENCOUNTER — Encounter (HOSPITAL_COMMUNITY)
Admission: RE | Admit: 2015-08-28 | Discharge: 2015-08-28 | Disposition: A | Discharge status: Home / Self Care | Payer: Self-pay | Source: Ambulatory Visit | Attending: Cardiovascular Disease | Admitting: Cardiovascular Disease

## 2015-08-30 ENCOUNTER — Encounter (HOSPITAL_COMMUNITY)
Admission: RE | Admit: 2015-08-30 | Discharge: 2015-08-30 | Disposition: A | Discharge status: Home / Self Care | Payer: Self-pay | Source: Ambulatory Visit | Attending: Cardiovascular Disease | Admitting: Cardiovascular Disease

## 2015-08-31 ENCOUNTER — Encounter (HOSPITAL_COMMUNITY)
Admission: RE | Admit: 2015-08-31 | Discharge: 2015-08-31 | Disposition: A | Discharge status: Home / Self Care | Payer: Self-pay | Source: Ambulatory Visit | Attending: Cardiovascular Disease | Admitting: Cardiovascular Disease

## 2015-09-02 ENCOUNTER — Other Ambulatory Visit: Payer: Self-pay | Admitting: Cardiovascular Disease

## 2015-09-04 ENCOUNTER — Encounter (HOSPITAL_COMMUNITY): Payer: Self-pay

## 2015-09-06 ENCOUNTER — Encounter (HOSPITAL_COMMUNITY)
Admission: RE | Admit: 2015-09-06 | Discharge: 2015-09-06 | Disposition: A | Discharge status: Home / Self Care | Payer: Self-pay | Source: Ambulatory Visit | Attending: Cardiovascular Disease | Admitting: Cardiovascular Disease

## 2015-09-07 ENCOUNTER — Encounter (HOSPITAL_COMMUNITY)
Admission: RE | Admit: 2015-09-07 | Discharge: 2015-09-07 | Disposition: A | Discharge status: Home / Self Care | Payer: Self-pay | Source: Ambulatory Visit | Attending: Cardiovascular Disease | Admitting: Cardiovascular Disease

## 2015-09-11 ENCOUNTER — Encounter (HOSPITAL_COMMUNITY): Payer: Self-pay

## 2015-09-13 ENCOUNTER — Encounter (HOSPITAL_COMMUNITY): Payer: Self-pay

## 2015-09-14 ENCOUNTER — Encounter (HOSPITAL_COMMUNITY): Payer: Self-pay

## 2015-09-18 ENCOUNTER — Encounter (HOSPITAL_COMMUNITY): Payer: Self-pay

## 2015-09-19 ENCOUNTER — Ambulatory Visit (INDEPENDENT_AMBULATORY_CARE_PROVIDER_SITE_OTHER): Payer: Medicare Other | Admitting: Cardiovascular Disease

## 2015-09-19 ENCOUNTER — Encounter: Payer: Self-pay | Admitting: Cardiovascular Disease

## 2015-09-19 VITALS — BP 116/72 | HR 64 | Ht 67.5 in | Wt 120.1 lb

## 2015-09-19 DIAGNOSIS — I5181 Takotsubo syndrome: Secondary | ICD-10-CM

## 2015-09-19 NOTE — Patient Instructions (Signed)
Medication Instructions:  Your physician has recommended you make the following change in your medication:  1. STOP Benazepril  Labwork: No new orders.   Testing/Procedures: No new orders.   Follow-Up: Your physician wants you to follow-up in: 1 YEAR with Dr Burt Knack.  You will receive a reminder letter in the mail two months in advance. If you don't receive a letter, please call our office to schedule the follow-up appointment.   Any Other Special Instructions Will Be Listed Below (If Applicable).     If you need a refill on your cardiac medications before your next appointment, please call your pharmacy.

## 2015-09-19 NOTE — Progress Notes (Signed)
Cardiology Office Note Date:  09/19/2015   ID:  Kirsten Rice, DOB February 17, 1946, MRN [id number]  PCP:  Sheela Stack, MD  Cardiologist:  Sherren Mocha, MD    Chief Complaint  Patient presents with  . Follow-up   History of Present Illness: Kirsten Rice is a 70 y.o. female who presents for follow-up of cardiomyopathy. She initially presented with Takotsubo's cardiomyopathy in 2007 and did well until 2016 when she had a recurrence. She had normal coronaries at cath lst year, LVEF was reduced at 25-35% in typical Takotsubo's pattern. Follow-up echo showed normalization of LV function with an LVEF of 60%. She presents today for follow-up.  The patient is doing well. She continues to participate in the maintenance phase of cardiac rehab without exertional symptoms. Notes BP is frequently in the 90's. Today, she denies symptoms of palpitations, chest pain, shortness of breath, orthopnea, PND, lower extremity edema, dizziness, or syncope.  Past Medical History  Diagnosis Date  . Takotsubo cardiomyopathy     a. 2007 Cath: nl cors, apical ballooning w/ subsequent recovery of LV fxn;  b. 01/2015 NSTEMI/Cath: nl cors, EF 25-25% w/ apical ballooning;  c. 01/2015 echo: EF 30%, septal, apical, mid anterior, inf HK, mildly dil LA/RA.  Marland Kitchen Raynaud's disease   . Mitral valve prolapse     a. 01/2015 Echo: Ef 25-35%, triv MR.  . Crohn's disease ([place])   . Deep vein thrombosis ([place])     pt denies: 04/08/13  . Telangiectasia   . Hyperthyroidism     pt denies:  04/08/13  . PONV (postoperative nausea and vomiting) 1970's    "when I had my appendix out"  . Essential hypertension   . Chronic systolic CHF (congestive heart failure) ([place])     a. 01/2015 Echo: EF 25-35% in setting of takotsubo cardiomyopathy.  . History of pneumonia     "2-3 times" (01/31/2015)  . Ocular headache     "weekly" (01/31/2015)  . Arthritis     "probably a little bit in my knees and hips" (01/31/2015)  . Basal cell carcinoma     . Squamous carcinoma ([place])   . Persistent dry cough     Past Surgical History  Procedure Laterality Date  . Shoulder arthroscopy w/ rotator cuff repair Right 1996  . Tubal ligation  1973  . Appendectomy  1971  . Colonoscopy    . Colonoscopy  05/11/2012    Procedure: COLONOSCOPY;  Surgeon: Lafayette Dragon, MD;  Location: [facility];  Service: Endoscopy;  Laterality: N/A;  . Cardiac catheterization  2006; 2007  . Basal cell carcinoma excision      forehead, back, legs, arms" (01/31/2015)  . Squamous cell carcinoma excision Left early 1980's    "eyelid"  . Cardiac catheterization N/A 01/31/2015    Procedure: Left Heart Cath and Coronary Angiography;  Surgeon: Jettie Booze, MD;  Location: [facility];  Service: Cardiovascular;  Laterality: N/A;    Current Outpatient Prescriptions  Medication Sig Dispense Refill  . acetaminophen (TYLENOL) 325 MG tablet Take 650 mg by mouth every 6 (six) hours as needed for mild pain or headache.    . albuterol (PROVENTIL HFA;VENTOLIN HFA) 108 (90 BASE) MCG/ACT inhaler Inhale 2 puffs into the lungs every 6 (six) hours as needed for wheezing or shortness of breath. 1 Inhaler 0  . aspirin 81 MG tablet Take 81 mg by mouth daily.      . balsalazide (COLAZAL) 750 MG capsule Take 2,250 mg by  mouth 3 (three) times daily.     . bifidobacterium infantis (ALIGN) capsule Take 1 capsule by mouth daily.    . Biotin 2500 MCG CAPS Take 2,500 mcg by mouth daily.     . Calcium-Vitamin D 600-200 MG-UNIT per tablet Take 1 tablet by mouth daily.     . carvedilol (COREG) 3.125 MG tablet TAKE 1 TABLET BY MOUTH TWICE DAILY WITH A MEAL 180 tablet 1  . clobetasol cream (TEMOVATE) 8.50 % Apply 1 application topically at bedtime as needed (dry skin on hands).   0  . fluticasone (FLONASE) 50 MCG/ACT nasal spray USE 2 SPRAYS IN EACH NOSTRIL DAILY AS NEEDED FOR CONGESTION. 16 g 5  . furosemide (LASIX) 20 MG tablet TAKE 1 TABLET BY MOUTH DAILY 90 tablet 1  . nitroGLYCERIN  (NITROSTAT) 0.4 MG SL tablet Place 1 tablet (0.4 mg total) under the tongue every 5 (five) minutes x 3 doses as needed for chest pain. 25 tablet 2  . ondansetron (ZOFRAN) 4 MG tablet Take 1 tablet (4 mg total) by mouth every 8 (eight) hours as needed for nausea or vomiting. 20 tablet 0  . Polyethyl Glycol-Propyl Glycol (SYSTANE PRESERVATIVE FREE) 0.4-0.3 % SOLN Place 1 Package into both eyes daily as needed (dryness). Only uses PRN    . potassium chloride SA (K-DUR,KLOR-CON) 20 MEQ tablet TAKE 1 TABLET BY MOUTH DAILY 90 tablet 1  . PRESCRIPTION MEDICATION Apply 1 application topically daily as needed (rosasea). Finacea Cream, uses PRN    . RESTASIS 0.05 % ophthalmic emulsion Place 1 drop into both eyes 2 (two) times daily.   3  . vitamin C (ASCORBIC ACID) 500 MG tablet Take 500 mg by mouth daily.       No current facility-administered medications for this visit.    Allergies:   Cafergot; Compazine; Ergotamine-caffeine; Prochlorperazine edisylate; and Tetanus-diphtheria toxoids td   Social History:  The patient  reports that she has never smoked. She has never used smokeless tobacco. She reports that she drinks about 6.0 oz of alcohol per week. She reports that she does not use illicit drugs.   Family History:  The patient's  family history includes Aortic stenosis in her father; Diabetes in her sister and sister; Hypertension in her father and mother; Hyperthyroidism in her sister, sister, sister, and sister. There is no history of Colon cancer.    ROS:  Please see the history of present illness.  Otherwise, review of systems is positive for cough, back pain, muscle pain, headaches.  All other systems are reviewed and negative.    PHYSICAL EXAM: VS:  BP 116/72 mmHg  Pulse 64  Ht 5' 7.5" (1.715 m)  Wt 54.486 kg (120 lb 1.9 oz)  BMI 18.52 kg/m2 , BMI Body mass index is 18.52 kg/(m^2). GEN: Very pleasant, thin woman, in no acute distress HEENT: normal Neck: no JVD, no masses. No carotid  bruits Cardiac: RRR without murmur or gallop                Respiratory:  clear to auscultation bilaterally, normal work of breathing GI: soft, nontender, nondistended, + BS MS: no deformity or atrophy Ext: no pretibial edema, pedal pulses 2+= bilaterally Skin: warm and dry, no rash Neuro:  Strength and sensation are intact Psych: euthymic mood, full affect  EKG:  EKG is not ordered today.  Recent Labs: 01/31/2015: TSH 1.603 02/03/2015: B Natriuretic Peptide 1452.8*; Hemoglobin 12.3; Platelets 273 02/09/2015: BUN 19; Creatinine, Ser 0.92; Potassium 4.3; Sodium 139  Lipid Panel     Component Value Date/Time   CHOL 158 02/01/2015 0332   TRIG 65 02/01/2015 0332   HDL 66 02/01/2015 0332   CHOLHDL 2.4 02/01/2015 0332   VLDL 13 02/01/2015 0332   LDLCALC 79 02/01/2015 0332      Wt Readings from Last 3 Encounters:  09/19/15 54.486 kg (120 lb 1.9 oz)  06/11/15 53.978 kg (119 lb)  03/09/15 52.98 kg (116 lb 12.8 oz)     Cardiac Studies Reviewed: 2D Echo 03-12-2015: Study Conclusions  - Left ventricle: The cavity size was normal. Wall thickness was normal. Systolic function was normal. The estimated ejection fraction was in the range of 55% to 60%. - Mitral valve: There was mild regurgitation.  ASSESSMENT AND PLAN: 1.  Takotsubo's cardiomyopathy: the patient is doing well, NYHA I sx's. BP continues to run low. Recommended stop benazepril and continue low-dose carvedilol. Good prognosis with normalization of LV function on most recent echo.   2. Essential HTN: as above. Monitored regularly at cardiac rehab.   Current medicines are reviewed with the patient today.  The patient does not have concerns regarding medicines.  Labs/ tests ordered today include:  No orders of the defined types were placed in this encounter.   Disposition:   FU one year  Signed, Sherren Mocha, MD  09/19/2015 10:04 AM    [facility] [address], [place], [place]   [address] Phone: [phone]; Fax: [phone]

## 2015-09-21 ENCOUNTER — Encounter (HOSPITAL_COMMUNITY)
Admission: RE | Admit: 2015-09-21 | Discharge: 2015-09-21 | Disposition: A | Discharge status: Home / Self Care | Payer: Self-pay | Source: Ambulatory Visit | Attending: Cardiovascular Disease | Admitting: Cardiovascular Disease

## 2015-09-21 DIAGNOSIS — I5181 Takotsubo syndrome: Secondary | ICD-10-CM | POA: Insufficient documentation

## 2015-09-21 DIAGNOSIS — I214 Non-ST elevation (NSTEMI) myocardial infarction: Secondary | ICD-10-CM | POA: Insufficient documentation

## 2015-09-25 ENCOUNTER — Encounter (HOSPITAL_COMMUNITY)
Admission: RE | Admit: 2015-09-25 | Discharge: 2015-09-25 | Disposition: A | Discharge status: Home / Self Care | Payer: Self-pay | Source: Ambulatory Visit | Attending: Cardiovascular Disease | Admitting: Cardiovascular Disease

## 2015-09-27 ENCOUNTER — Encounter (HOSPITAL_COMMUNITY)
Admission: RE | Admit: 2015-09-27 | Discharge: 2015-09-27 | Disposition: A | Discharge status: Home / Self Care | Payer: Self-pay | Source: Ambulatory Visit | Attending: Cardiovascular Disease | Admitting: Cardiovascular Disease

## 2015-09-28 ENCOUNTER — Encounter (HOSPITAL_COMMUNITY)
Admission: RE | Admit: 2015-09-28 | Discharge: 2015-09-28 | Disposition: A | Discharge status: Home / Self Care | Payer: Self-pay | Source: Ambulatory Visit | Attending: Cardiovascular Disease | Admitting: Cardiovascular Disease

## 2015-10-02 ENCOUNTER — Encounter (HOSPITAL_COMMUNITY)
Admission: RE | Admit: 2015-10-02 | Discharge: 2015-10-02 | Disposition: A | Discharge status: Home / Self Care | Payer: Self-pay | Source: Ambulatory Visit | Attending: Cardiovascular Disease | Admitting: Cardiovascular Disease

## 2015-10-04 ENCOUNTER — Encounter (HOSPITAL_COMMUNITY)
Admission: RE | Admit: 2015-10-04 | Discharge: 2015-10-04 | Disposition: A | Discharge status: Home / Self Care | Payer: Self-pay | Source: Ambulatory Visit | Attending: Cardiovascular Disease | Admitting: Cardiovascular Disease

## 2015-10-04 ENCOUNTER — Other Ambulatory Visit: Payer: Self-pay

## 2015-10-04 DIAGNOSIS — Z1231 Encounter for screening mammogram for malignant neoplasm of breast: Secondary | ICD-10-CM

## 2015-10-05 ENCOUNTER — Encounter (HOSPITAL_COMMUNITY)
Admission: RE | Admit: 2015-10-05 | Discharge: 2015-10-05 | Disposition: A | Discharge status: Home / Self Care | Payer: Self-pay | Source: Ambulatory Visit | Attending: Cardiovascular Disease | Admitting: Cardiovascular Disease

## 2015-10-09 ENCOUNTER — Encounter (HOSPITAL_COMMUNITY): Admission: RE | Admit: 2015-10-09 | Payer: Self-pay | Source: Ambulatory Visit

## 2015-10-11 ENCOUNTER — Encounter (HOSPITAL_COMMUNITY)
Admission: RE | Admit: 2015-10-11 | Discharge: 2015-10-11 | Disposition: A | Discharge status: Home / Self Care | Payer: Self-pay | Source: Ambulatory Visit | Attending: Cardiovascular Disease | Admitting: Cardiovascular Disease

## 2015-10-12 ENCOUNTER — Encounter (HOSPITAL_COMMUNITY)
Admission: RE | Admit: 2015-10-12 | Discharge: 2015-10-12 | Disposition: A | Discharge status: Home / Self Care | Payer: Self-pay | Source: Ambulatory Visit | Attending: Cardiovascular Disease | Admitting: Cardiovascular Disease

## 2015-10-16 ENCOUNTER — Encounter (HOSPITAL_COMMUNITY): Admission: RE | Admit: 2015-10-16 | Payer: Self-pay | Source: Ambulatory Visit

## 2015-10-17 ENCOUNTER — Other Ambulatory Visit: Payer: Self-pay | Admitting: Cardiovascular Disease

## 2015-10-18 ENCOUNTER — Encounter (HOSPITAL_COMMUNITY)
Admission: RE | Admit: 2015-10-18 | Discharge: 2015-10-18 | Disposition: A | Discharge status: Home / Self Care | Payer: Self-pay | Source: Ambulatory Visit | Attending: Cardiovascular Disease | Admitting: Cardiovascular Disease

## 2015-10-19 ENCOUNTER — Encounter (HOSPITAL_COMMUNITY)
Admission: RE | Admit: 2015-10-19 | Discharge: 2015-10-19 | Disposition: A | Discharge status: Home / Self Care | Payer: Self-pay | Source: Ambulatory Visit | Attending: Cardiovascular Disease | Admitting: Cardiovascular Disease

## 2015-10-23 ENCOUNTER — Encounter (HOSPITAL_COMMUNITY)
Admission: RE | Admit: 2015-10-23 | Discharge: 2015-10-23 | Disposition: A | Discharge status: Home / Self Care | Payer: Self-pay | Source: Ambulatory Visit | Attending: Cardiovascular Disease | Admitting: Cardiovascular Disease

## 2015-10-23 DIAGNOSIS — I5181 Takotsubo syndrome: Secondary | ICD-10-CM | POA: Insufficient documentation

## 2015-10-23 DIAGNOSIS — I214 Non-ST elevation (NSTEMI) myocardial infarction: Secondary | ICD-10-CM | POA: Insufficient documentation

## 2015-10-25 ENCOUNTER — Encounter (HOSPITAL_COMMUNITY)
Admission: RE | Admit: 2015-10-25 | Discharge: 2015-10-25 | Disposition: A | Discharge status: Home / Self Care | Payer: Self-pay | Source: Ambulatory Visit | Attending: Cardiovascular Disease | Admitting: Cardiovascular Disease

## 2015-10-26 ENCOUNTER — Ambulatory Visit
Admission: RE | Admit: 2015-10-26 | Discharge: 2015-10-26 | Disposition: A | Discharge status: Home / Self Care | Payer: Medicare Other | Source: Ambulatory Visit

## 2015-10-26 ENCOUNTER — Encounter (HOSPITAL_COMMUNITY)
Admission: RE | Admit: 2015-10-26 | Discharge: 2015-10-26 | Disposition: A | Discharge status: Home / Self Care | Payer: Self-pay | Source: Ambulatory Visit | Attending: Cardiovascular Disease | Admitting: Cardiovascular Disease

## 2015-10-26 DIAGNOSIS — Z1231 Encounter for screening mammogram for malignant neoplasm of breast: Secondary | ICD-10-CM

## 2015-10-29 ENCOUNTER — Other Ambulatory Visit: Payer: Self-pay | Admitting: Registered Nurse

## 2015-10-29 DIAGNOSIS — M5416 Radiculopathy, lumbar region: Secondary | ICD-10-CM

## 2015-10-30 ENCOUNTER — Encounter (HOSPITAL_COMMUNITY): Payer: Self-pay

## 2015-11-01 ENCOUNTER — Encounter (HOSPITAL_COMMUNITY)
Admission: RE | Admit: 2015-11-01 | Discharge: 2015-11-01 | Disposition: A | Discharge status: Home / Self Care | Payer: Self-pay | Source: Ambulatory Visit | Attending: Cardiovascular Disease | Admitting: Cardiovascular Disease

## 2015-11-01 ENCOUNTER — Ambulatory Visit
Admission: RE | Admit: 2015-11-01 | Discharge: 2015-11-01 | Disposition: A | Discharge status: Home / Self Care | Payer: Medicare Other | Source: Ambulatory Visit | Attending: Registered Nurse | Admitting: Registered Nurse

## 2015-11-01 DIAGNOSIS — M5416 Radiculopathy, lumbar region: Secondary | ICD-10-CM

## 2015-11-02 ENCOUNTER — Encounter (HOSPITAL_COMMUNITY)
Admission: RE | Admit: 2015-11-02 | Discharge: 2015-11-02 | Disposition: A | Discharge status: Home / Self Care | Payer: Self-pay | Source: Ambulatory Visit | Attending: Cardiovascular Disease | Admitting: Cardiovascular Disease

## 2015-11-06 ENCOUNTER — Encounter (HOSPITAL_COMMUNITY)
Admission: RE | Admit: 2015-11-06 | Discharge: 2015-11-06 | Disposition: A | Discharge status: Home / Self Care | Payer: Self-pay | Source: Ambulatory Visit | Attending: Cardiovascular Disease | Admitting: Cardiovascular Disease

## 2015-11-08 ENCOUNTER — Encounter (HOSPITAL_COMMUNITY)
Admission: RE | Admit: 2015-11-08 | Discharge: 2015-11-08 | Disposition: A | Discharge status: Home / Self Care | Payer: Self-pay | Source: Ambulatory Visit | Attending: Cardiovascular Disease | Admitting: Cardiovascular Disease

## 2015-11-09 ENCOUNTER — Encounter (HOSPITAL_COMMUNITY)
Admission: RE | Admit: 2015-11-09 | Discharge: 2015-11-09 | Disposition: A | Discharge status: Home / Self Care | Payer: Self-pay | Source: Ambulatory Visit | Attending: Cardiovascular Disease | Admitting: Cardiovascular Disease

## 2015-11-13 ENCOUNTER — Encounter (HOSPITAL_COMMUNITY)
Admission: RE | Admit: 2015-11-13 | Discharge: 2015-11-13 | Disposition: A | Discharge status: Home / Self Care | Payer: Self-pay | Source: Ambulatory Visit | Attending: Cardiovascular Disease | Admitting: Cardiovascular Disease

## 2015-11-15 ENCOUNTER — Encounter (HOSPITAL_COMMUNITY)
Admission: RE | Admit: 2015-11-15 | Discharge: 2015-11-15 | Disposition: A | Discharge status: Home / Self Care | Payer: Self-pay | Source: Ambulatory Visit | Attending: Cardiovascular Disease | Admitting: Cardiovascular Disease

## 2015-11-16 ENCOUNTER — Encounter (HOSPITAL_COMMUNITY): Payer: Self-pay

## 2015-11-20 ENCOUNTER — Encounter (HOSPITAL_COMMUNITY)
Admission: RE | Admit: 2015-11-20 | Discharge: 2015-11-20 | Disposition: A | Discharge status: Home / Self Care | Payer: Self-pay | Source: Ambulatory Visit | Attending: Cardiovascular Disease | Admitting: Cardiovascular Disease

## 2015-11-20 DIAGNOSIS — I5181 Takotsubo syndrome: Secondary | ICD-10-CM | POA: Insufficient documentation

## 2015-11-20 DIAGNOSIS — I214 Non-ST elevation (NSTEMI) myocardial infarction: Secondary | ICD-10-CM | POA: Insufficient documentation

## 2015-11-22 ENCOUNTER — Encounter (HOSPITAL_COMMUNITY)
Admission: RE | Admit: 2015-11-22 | Discharge: 2015-11-22 | Disposition: A | Discharge status: Home / Self Care | Payer: Self-pay | Source: Ambulatory Visit | Attending: Cardiovascular Disease | Admitting: Cardiovascular Disease

## 2015-11-23 ENCOUNTER — Encounter (HOSPITAL_COMMUNITY): Payer: Self-pay

## 2015-11-27 ENCOUNTER — Encounter (HOSPITAL_COMMUNITY): Payer: Self-pay

## 2015-11-29 ENCOUNTER — Encounter (HOSPITAL_COMMUNITY)
Admission: RE | Admit: 2015-11-29 | Discharge: 2015-11-29 | Disposition: A | Discharge status: Home / Self Care | Payer: Self-pay | Source: Ambulatory Visit | Attending: Cardiovascular Disease | Admitting: Cardiovascular Disease

## 2015-11-30 ENCOUNTER — Encounter (HOSPITAL_COMMUNITY)
Admission: RE | Admit: 2015-11-30 | Discharge: 2015-11-30 | Disposition: A | Discharge status: Home / Self Care | Payer: Self-pay | Source: Ambulatory Visit | Attending: Cardiovascular Disease | Admitting: Cardiovascular Disease

## 2015-12-04 ENCOUNTER — Encounter (HOSPITAL_COMMUNITY)
Admission: RE | Admit: 2015-12-04 | Discharge: 2015-12-04 | Disposition: A | Discharge status: Home / Self Care | Payer: Self-pay | Source: Ambulatory Visit | Attending: Cardiovascular Disease | Admitting: Cardiovascular Disease

## 2015-12-06 ENCOUNTER — Encounter (HOSPITAL_COMMUNITY)
Admission: RE | Admit: 2015-12-06 | Discharge: 2015-12-06 | Disposition: A | Discharge status: Home / Self Care | Payer: Self-pay | Source: Ambulatory Visit | Attending: Cardiovascular Disease | Admitting: Cardiovascular Disease

## 2015-12-07 ENCOUNTER — Encounter (HOSPITAL_COMMUNITY)
Admission: RE | Admit: 2015-12-07 | Discharge: 2015-12-07 | Disposition: A | Discharge status: Home / Self Care | Payer: Self-pay | Source: Ambulatory Visit | Attending: Cardiovascular Disease | Admitting: Cardiovascular Disease

## 2015-12-11 ENCOUNTER — Encounter (HOSPITAL_COMMUNITY)
Admission: RE | Admit: 2015-12-11 | Discharge: 2015-12-11 | Disposition: A | Discharge status: Home / Self Care | Payer: Self-pay | Source: Ambulatory Visit | Attending: Cardiovascular Disease | Admitting: Cardiovascular Disease

## 2015-12-13 ENCOUNTER — Encounter (HOSPITAL_COMMUNITY)
Admission: RE | Admit: 2015-12-13 | Discharge: 2015-12-13 | Disposition: A | Discharge status: Home / Self Care | Payer: Self-pay | Source: Ambulatory Visit | Attending: Cardiovascular Disease | Admitting: Cardiovascular Disease

## 2015-12-14 ENCOUNTER — Encounter (HOSPITAL_COMMUNITY)
Admission: RE | Admit: 2015-12-14 | Discharge: 2015-12-14 | Disposition: A | Discharge status: Home / Self Care | Payer: Self-pay | Source: Ambulatory Visit | Attending: Cardiovascular Disease | Admitting: Cardiovascular Disease

## 2015-12-18 ENCOUNTER — Encounter (HOSPITAL_COMMUNITY)
Admission: RE | Admit: 2015-12-18 | Discharge: 2015-12-18 | Disposition: A | Discharge status: Home / Self Care | Payer: Self-pay | Source: Ambulatory Visit | Attending: Cardiovascular Disease | Admitting: Cardiovascular Disease

## 2015-12-20 ENCOUNTER — Encounter (HOSPITAL_COMMUNITY)
Admission: RE | Admit: 2015-12-20 | Discharge: 2015-12-20 | Disposition: A | Discharge status: Home / Self Care | Payer: Self-pay | Source: Ambulatory Visit | Attending: Cardiovascular Disease | Admitting: Cardiovascular Disease

## 2015-12-20 DIAGNOSIS — I5181 Takotsubo syndrome: Secondary | ICD-10-CM | POA: Insufficient documentation

## 2015-12-20 DIAGNOSIS — I214 Non-ST elevation (NSTEMI) myocardial infarction: Secondary | ICD-10-CM | POA: Insufficient documentation

## 2015-12-21 ENCOUNTER — Encounter (HOSPITAL_COMMUNITY)
Admission: RE | Admit: 2015-12-21 | Discharge: 2015-12-21 | Disposition: A | Discharge status: Home / Self Care | Payer: Self-pay | Source: Ambulatory Visit | Attending: Cardiovascular Disease | Admitting: Cardiovascular Disease

## 2015-12-25 ENCOUNTER — Encounter (HOSPITAL_COMMUNITY)
Admission: RE | Admit: 2015-12-25 | Discharge: 2015-12-25 | Disposition: A | Discharge status: Home / Self Care | Payer: Self-pay | Source: Ambulatory Visit | Attending: Cardiovascular Disease | Admitting: Cardiovascular Disease

## 2015-12-27 ENCOUNTER — Encounter (HOSPITAL_COMMUNITY)
Admission: RE | Admit: 2015-12-27 | Discharge: 2015-12-27 | Disposition: A | Discharge status: Home / Self Care | Payer: Self-pay | Source: Ambulatory Visit | Attending: Cardiovascular Disease | Admitting: Cardiovascular Disease

## 2015-12-28 ENCOUNTER — Encounter (HOSPITAL_COMMUNITY)
Admission: RE | Admit: 2015-12-28 | Discharge: 2015-12-28 | Disposition: A | Discharge status: Home / Self Care | Payer: Self-pay | Source: Ambulatory Visit | Attending: Cardiovascular Disease | Admitting: Cardiovascular Disease

## 2016-01-01 ENCOUNTER — Encounter (HOSPITAL_COMMUNITY)
Admission: RE | Admit: 2016-01-01 | Discharge: 2016-01-01 | Disposition: A | Discharge status: Home / Self Care | Payer: Self-pay | Source: Ambulatory Visit | Attending: Cardiovascular Disease | Admitting: Cardiovascular Disease

## 2016-01-03 ENCOUNTER — Encounter (HOSPITAL_COMMUNITY)
Admission: RE | Admit: 2016-01-03 | Discharge: 2016-01-03 | Disposition: A | Discharge status: Home / Self Care | Payer: Self-pay | Source: Ambulatory Visit | Attending: Cardiovascular Disease | Admitting: Cardiovascular Disease

## 2016-01-04 ENCOUNTER — Encounter (HOSPITAL_COMMUNITY)
Admission: RE | Admit: 2016-01-04 | Discharge: 2016-01-04 | Disposition: A | Discharge status: Home / Self Care | Payer: Self-pay | Source: Ambulatory Visit | Attending: Cardiovascular Disease | Admitting: Cardiovascular Disease

## 2016-01-08 ENCOUNTER — Encounter (HOSPITAL_COMMUNITY)
Admission: RE | Admit: 2016-01-08 | Discharge: 2016-01-08 | Disposition: A | Discharge status: Home / Self Care | Payer: Self-pay | Source: Ambulatory Visit | Attending: Cardiovascular Disease | Admitting: Cardiovascular Disease

## 2016-01-10 ENCOUNTER — Encounter (HOSPITAL_COMMUNITY)
Admission: RE | Admit: 2016-01-10 | Discharge: 2016-01-10 | Disposition: A | Discharge status: Home / Self Care | Payer: Self-pay | Source: Ambulatory Visit | Attending: Cardiovascular Disease | Admitting: Cardiovascular Disease

## 2016-01-11 ENCOUNTER — Encounter (HOSPITAL_COMMUNITY)
Admission: RE | Admit: 2016-01-11 | Discharge: 2016-01-11 | Disposition: A | Discharge status: Home / Self Care | Payer: Self-pay | Source: Ambulatory Visit | Attending: Cardiovascular Disease | Admitting: Cardiovascular Disease

## 2016-01-15 ENCOUNTER — Other Ambulatory Visit: Payer: Self-pay | Admitting: Cardiovascular Disease

## 2016-01-15 ENCOUNTER — Encounter (HOSPITAL_COMMUNITY)
Admission: RE | Admit: 2016-01-15 | Discharge: 2016-01-15 | Disposition: A | Discharge status: Home / Self Care | Payer: Self-pay | Source: Ambulatory Visit | Attending: Cardiovascular Disease | Admitting: Cardiovascular Disease

## 2016-01-17 ENCOUNTER — Encounter (HOSPITAL_COMMUNITY)
Admission: RE | Admit: 2016-01-17 | Discharge: 2016-01-17 | Disposition: A | Discharge status: Home / Self Care | Payer: Self-pay | Source: Ambulatory Visit | Attending: Cardiovascular Disease | Admitting: Cardiovascular Disease

## 2016-01-18 ENCOUNTER — Encounter (HOSPITAL_COMMUNITY)
Admission: RE | Admit: 2016-01-18 | Discharge: 2016-01-18 | Disposition: A | Discharge status: Home / Self Care | Payer: Self-pay | Source: Ambulatory Visit | Attending: Cardiovascular Disease | Admitting: Cardiovascular Disease

## 2016-01-24 ENCOUNTER — Encounter (HOSPITAL_COMMUNITY)
Admission: RE | Admit: 2016-01-24 | Discharge: 2016-01-24 | Disposition: A | Discharge status: Home / Self Care | Payer: Self-pay | Source: Ambulatory Visit | Attending: Cardiovascular Disease | Admitting: Cardiovascular Disease

## 2016-01-24 DIAGNOSIS — I5181 Takotsubo syndrome: Secondary | ICD-10-CM | POA: Insufficient documentation

## 2016-01-24 DIAGNOSIS — I214 Non-ST elevation (NSTEMI) myocardial infarction: Secondary | ICD-10-CM | POA: Insufficient documentation

## 2016-01-25 ENCOUNTER — Encounter (HOSPITAL_COMMUNITY)
Admission: RE | Admit: 2016-01-25 | Discharge: 2016-01-25 | Disposition: A | Discharge status: Home / Self Care | Payer: Self-pay | Source: Ambulatory Visit | Attending: Cardiovascular Disease | Admitting: Cardiovascular Disease

## 2016-01-29 ENCOUNTER — Encounter (HOSPITAL_COMMUNITY)
Admission: RE | Admit: 2016-01-29 | Discharge: 2016-01-29 | Disposition: A | Discharge status: Home / Self Care | Payer: Self-pay | Source: Ambulatory Visit | Attending: Cardiovascular Disease | Admitting: Cardiovascular Disease

## 2016-01-31 ENCOUNTER — Encounter (HOSPITAL_COMMUNITY)
Admission: RE | Admit: 2016-01-31 | Discharge: 2016-01-31 | Disposition: A | Discharge status: Home / Self Care | Payer: Self-pay | Source: Ambulatory Visit | Attending: Cardiovascular Disease | Admitting: Cardiovascular Disease

## 2016-02-01 ENCOUNTER — Encounter (HOSPITAL_COMMUNITY): Payer: Self-pay

## 2016-02-05 ENCOUNTER — Encounter (HOSPITAL_COMMUNITY): Payer: Self-pay

## 2016-02-07 ENCOUNTER — Encounter (HOSPITAL_COMMUNITY)
Admission: RE | Admit: 2016-02-07 | Discharge: 2016-02-07 | Disposition: A | Discharge status: Home / Self Care | Payer: Self-pay | Source: Ambulatory Visit | Attending: Cardiovascular Disease | Admitting: Cardiovascular Disease

## 2016-02-08 ENCOUNTER — Encounter (HOSPITAL_COMMUNITY)
Admission: RE | Admit: 2016-02-08 | Discharge: 2016-02-08 | Disposition: A | Discharge status: Home / Self Care | Payer: Self-pay | Source: Ambulatory Visit | Attending: Cardiovascular Disease | Admitting: Cardiovascular Disease

## 2016-02-12 ENCOUNTER — Encounter (HOSPITAL_COMMUNITY)
Admission: RE | Admit: 2016-02-12 | Discharge: 2016-02-12 | Disposition: A | Discharge status: Home / Self Care | Payer: Self-pay | Source: Ambulatory Visit | Attending: Cardiovascular Disease | Admitting: Cardiovascular Disease

## 2016-02-14 ENCOUNTER — Encounter (HOSPITAL_COMMUNITY)
Admission: RE | Admit: 2016-02-14 | Discharge: 2016-02-14 | Disposition: A | Discharge status: Home / Self Care | Payer: Self-pay | Source: Ambulatory Visit | Attending: Cardiovascular Disease | Admitting: Cardiovascular Disease

## 2016-02-15 ENCOUNTER — Encounter (HOSPITAL_COMMUNITY)
Admission: RE | Admit: 2016-02-15 | Discharge: 2016-02-15 | Disposition: A | Discharge status: Home / Self Care | Payer: Self-pay | Source: Ambulatory Visit | Attending: Cardiovascular Disease | Admitting: Cardiovascular Disease

## 2016-02-19 ENCOUNTER — Encounter (HOSPITAL_COMMUNITY)
Admission: RE | Admit: 2016-02-19 | Discharge: 2016-02-19 | Disposition: A | Discharge status: Home / Self Care | Payer: Self-pay | Source: Ambulatory Visit | Attending: Cardiovascular Disease | Admitting: Cardiovascular Disease

## 2016-02-19 DIAGNOSIS — I5181 Takotsubo syndrome: Secondary | ICD-10-CM | POA: Insufficient documentation

## 2016-02-19 DIAGNOSIS — I214 Non-ST elevation (NSTEMI) myocardial infarction: Secondary | ICD-10-CM | POA: Insufficient documentation

## 2016-02-21 ENCOUNTER — Encounter (HOSPITAL_COMMUNITY)
Admission: RE | Admit: 2016-02-21 | Discharge: 2016-02-21 | Disposition: A | Discharge status: Home / Self Care | Payer: Self-pay | Source: Ambulatory Visit | Attending: Cardiovascular Disease | Admitting: Cardiovascular Disease

## 2016-02-22 ENCOUNTER — Encounter (HOSPITAL_COMMUNITY)
Admission: RE | Admit: 2016-02-22 | Discharge: 2016-02-22 | Disposition: A | Discharge status: Home / Self Care | Payer: Self-pay | Source: Ambulatory Visit | Attending: Cardiovascular Disease | Admitting: Cardiovascular Disease

## 2016-02-26 ENCOUNTER — Encounter (HOSPITAL_COMMUNITY)
Admission: RE | Admit: 2016-02-26 | Discharge: 2016-02-26 | Disposition: A | Discharge status: Home / Self Care | Payer: Self-pay | Source: Ambulatory Visit | Attending: Cardiovascular Disease | Admitting: Cardiovascular Disease

## 2016-02-28 ENCOUNTER — Encounter (HOSPITAL_COMMUNITY)
Admission: RE | Admit: 2016-02-28 | Discharge: 2016-02-28 | Disposition: A | Discharge status: Home / Self Care | Payer: Self-pay | Source: Ambulatory Visit | Attending: Cardiovascular Disease | Admitting: Cardiovascular Disease

## 2016-02-29 ENCOUNTER — Encounter (HOSPITAL_COMMUNITY)
Admission: RE | Admit: 2016-02-29 | Discharge: 2016-02-29 | Disposition: A | Discharge status: Home / Self Care | Payer: Self-pay | Source: Ambulatory Visit | Attending: Cardiovascular Disease | Admitting: Cardiovascular Disease

## 2016-03-04 ENCOUNTER — Encounter (HOSPITAL_COMMUNITY)
Admission: RE | Admit: 2016-03-04 | Discharge: 2016-03-04 | Disposition: A | Discharge status: Home / Self Care | Payer: Self-pay | Source: Ambulatory Visit | Attending: Cardiovascular Disease | Admitting: Cardiovascular Disease

## 2016-03-06 ENCOUNTER — Encounter (HOSPITAL_COMMUNITY)
Admission: RE | Admit: 2016-03-06 | Discharge: 2016-03-06 | Disposition: A | Discharge status: Home / Self Care | Payer: Self-pay | Source: Ambulatory Visit | Attending: Cardiovascular Disease | Admitting: Cardiovascular Disease

## 2016-03-07 ENCOUNTER — Encounter (HOSPITAL_COMMUNITY)
Admission: RE | Admit: 2016-03-07 | Discharge: 2016-03-07 | Disposition: A | Discharge status: Home / Self Care | Payer: Self-pay | Source: Ambulatory Visit | Attending: Cardiovascular Disease | Admitting: Cardiovascular Disease

## 2016-03-11 ENCOUNTER — Encounter (HOSPITAL_COMMUNITY)
Admission: RE | Admit: 2016-03-11 | Discharge: 2016-03-11 | Disposition: A | Discharge status: Home / Self Care | Payer: Self-pay | Source: Ambulatory Visit | Attending: Cardiovascular Disease | Admitting: Cardiovascular Disease

## 2016-03-13 ENCOUNTER — Encounter (HOSPITAL_COMMUNITY)
Admission: RE | Admit: 2016-03-13 | Discharge: 2016-03-13 | Disposition: A | Discharge status: Home / Self Care | Payer: Self-pay | Source: Ambulatory Visit | Attending: Cardiovascular Disease | Admitting: Cardiovascular Disease

## 2016-03-14 ENCOUNTER — Encounter (HOSPITAL_COMMUNITY)
Admission: RE | Admit: 2016-03-14 | Discharge: 2016-03-14 | Disposition: A | Discharge status: Home / Self Care | Payer: Self-pay | Source: Ambulatory Visit | Attending: Cardiovascular Disease | Admitting: Cardiovascular Disease

## 2016-03-18 ENCOUNTER — Encounter (HOSPITAL_COMMUNITY)
Admission: RE | Admit: 2016-03-18 | Discharge: 2016-03-18 | Disposition: A | Discharge status: Home / Self Care | Payer: Self-pay | Source: Ambulatory Visit | Attending: Cardiovascular Disease | Admitting: Cardiovascular Disease

## 2016-03-20 ENCOUNTER — Encounter (HOSPITAL_COMMUNITY)
Admission: RE | Admit: 2016-03-20 | Discharge: 2016-03-20 | Disposition: A | Discharge status: Home / Self Care | Payer: Self-pay | Source: Ambulatory Visit | Attending: Cardiovascular Disease | Admitting: Cardiovascular Disease

## 2016-03-21 ENCOUNTER — Encounter (HOSPITAL_COMMUNITY)
Admission: RE | Admit: 2016-03-21 | Discharge: 2016-03-21 | Disposition: A | Discharge status: Home / Self Care | Payer: Self-pay | Source: Ambulatory Visit | Attending: Cardiovascular Disease | Admitting: Cardiovascular Disease

## 2016-03-21 DIAGNOSIS — I5181 Takotsubo syndrome: Secondary | ICD-10-CM | POA: Insufficient documentation

## 2016-03-21 DIAGNOSIS — I214 Non-ST elevation (NSTEMI) myocardial infarction: Secondary | ICD-10-CM | POA: Insufficient documentation

## 2016-03-25 ENCOUNTER — Encounter (HOSPITAL_COMMUNITY)
Admission: RE | Admit: 2016-03-25 | Discharge: 2016-03-25 | Disposition: A | Discharge status: Home / Self Care | Payer: Self-pay | Source: Ambulatory Visit | Attending: Cardiovascular Disease | Admitting: Cardiovascular Disease

## 2016-03-27 ENCOUNTER — Encounter (HOSPITAL_COMMUNITY)
Admission: RE | Admit: 2016-03-27 | Discharge: 2016-03-27 | Disposition: A | Discharge status: Home / Self Care | Payer: Self-pay | Source: Ambulatory Visit | Attending: Cardiovascular Disease | Admitting: Cardiovascular Disease

## 2016-03-28 ENCOUNTER — Encounter (HOSPITAL_COMMUNITY): Payer: Self-pay

## 2016-04-01 ENCOUNTER — Encounter (HOSPITAL_COMMUNITY)
Admission: RE | Admit: 2016-04-01 | Discharge: 2016-04-01 | Disposition: A | Discharge status: Home / Self Care | Payer: Self-pay | Source: Ambulatory Visit | Attending: Cardiovascular Disease | Admitting: Cardiovascular Disease

## 2016-04-03 ENCOUNTER — Emergency Department (HOSPITAL_BASED_OUTPATIENT_CLINIC_OR_DEPARTMENT_OTHER): Payer: Medicare Other

## 2016-04-03 ENCOUNTER — Other Ambulatory Visit: Payer: Self-pay

## 2016-04-03 ENCOUNTER — Encounter (HOSPITAL_BASED_OUTPATIENT_CLINIC_OR_DEPARTMENT_OTHER): Payer: Self-pay

## 2016-04-03 ENCOUNTER — Encounter (HOSPITAL_COMMUNITY)
Admission: RE | Admit: 2016-04-03 | Discharge: 2016-04-03 | Disposition: A | Discharge status: Home / Self Care | Payer: Self-pay | Source: Ambulatory Visit | Attending: Cardiovascular Disease | Admitting: Cardiovascular Disease

## 2016-04-03 ENCOUNTER — Emergency Department (HOSPITAL_BASED_OUTPATIENT_CLINIC_OR_DEPARTMENT_OTHER)
Admission: EM | Admit: 2016-04-03 | Discharge: 2016-04-03 | Disposition: A | Discharge status: Home / Self Care | Payer: Medicare Other | Attending: Emergency Medicine | Admitting: Emergency Medicine

## 2016-04-03 DIAGNOSIS — M549 Dorsalgia, unspecified: Secondary | ICD-10-CM | POA: Diagnosis not present

## 2016-04-03 DIAGNOSIS — Z79899 Other long term (current) drug therapy: Secondary | ICD-10-CM | POA: Diagnosis not present

## 2016-04-03 DIAGNOSIS — I110 Hypertensive heart disease with heart failure: Secondary | ICD-10-CM | POA: Diagnosis not present

## 2016-04-03 DIAGNOSIS — Z7982 Long term (current) use of aspirin: Secondary | ICD-10-CM | POA: Diagnosis not present

## 2016-04-03 DIAGNOSIS — R51 Headache: Secondary | ICD-10-CM | POA: Diagnosis present

## 2016-04-03 DIAGNOSIS — I5022 Chronic systolic (congestive) heart failure: Secondary | ICD-10-CM | POA: Diagnosis not present

## 2016-04-03 DIAGNOSIS — G43909 Migraine, unspecified, not intractable, without status migrainosus: Secondary | ICD-10-CM | POA: Insufficient documentation

## 2016-04-03 DIAGNOSIS — R110 Nausea: Secondary | ICD-10-CM | POA: Diagnosis not present

## 2016-04-03 HISTORY — DX: Acute myocardial infarction, unspecified: I21.9

## 2016-04-03 LAB — CBC
HEMATOCRIT: 43.5 % (ref 36.0–46.0)
Hemoglobin: 14.4 g/dL (ref 12.0–15.0)
MCH: 30.7 pg (ref 26.0–34.0)
MCHC: 33.1 g/dL (ref 30.0–36.0)
MCV: 92.8 fL (ref 78.0–100.0)
PLATELETS: 263 10*3/uL (ref 150–400)
RBC: 4.69 MIL/uL (ref 3.87–5.11)
RDW: 13.2 % (ref 11.5–15.5)
WBC: 7.8 10*3/uL (ref 4.0–10.5)

## 2016-04-03 LAB — BASIC METABOLIC PANEL
Anion gap: 8 (ref 5–15)
BUN: 15 mg/dL (ref 6–20)
CALCIUM: 10 mg/dL (ref 8.9–10.3)
CO2: 30 mmol/L (ref 22–32)
CREATININE: 0.73 mg/dL (ref 0.44–1.00)
Chloride: 101 mmol/L (ref 101–111)
GFR calc Af Amer: >60 mL/min (ref >60)
GLUCOSE: 99 mg/dL (ref 65–99)
POTASSIUM: 3.5 mmol/L (ref 3.5–5.1)
Sodium: 139 mmol/L (ref 135–145)

## 2016-04-03 MED ORDER — ONDANSETRON HCL 4 MG/2ML IJ SOLN
4.0000 mg | Freq: Once | INTRAMUSCULAR | Status: AC
  Administered 2016-04-03: 4 mg via INTRAVENOUS
  Filled 2016-04-03: qty 2

## 2016-04-03 MED ORDER — DIPHENHYDRAMINE HCL 50 MG/ML IJ SOLN
12.5000 mg | Freq: Once | INTRAMUSCULAR | Status: AC
  Administered 2016-04-03: 12.5 mg via INTRAVENOUS
  Filled 2016-04-03: qty 1

## 2016-04-03 MED ORDER — SODIUM CHLORIDE 0.9 % IV SOLN: INTRAVENOUS | Status: DC

## 2016-04-03 MED ORDER — DEXAMETHASONE SODIUM PHOSPHATE 10 MG/ML IJ SOLN
10.0000 mg | Freq: Once | INTRAMUSCULAR | Status: AC
  Administered 2016-04-03: 10 mg via INTRAVENOUS
  Filled 2016-04-03: qty 1

## 2016-04-03 MED ORDER — IOPAMIDOL (ISOVUE-370) INJECTION 76%
100.0000 mL | Freq: Once | INTRAVENOUS | Status: AC | PRN
  Administered 2016-04-03: 100 mL via INTRAVENOUS

## 2016-04-03 MED ORDER — HYDROMORPHONE HCL 1 MG/ML IJ SOLN
1.0000 mg | Freq: Once | INTRAMUSCULAR | Status: AC
Start: 2016-04-03 — End: 2016-04-03
  Administered 2016-04-03: 1 mg via INTRAVENOUS
  Filled 2016-04-03: qty 1

## 2016-04-03 MED ORDER — IOPAMIDOL (ISOVUE-300) INJECTION 61%: 100.0000 mL | Freq: Once | INTRAVENOUS | Status: DC | PRN

## 2016-04-03 MED ORDER — METOCLOPRAMIDE HCL 5 MG/ML IJ SOLN
10.0000 mg | Freq: Once | INTRAMUSCULAR | Status: AC
  Administered 2016-04-03: 10 mg via INTRAVENOUS
  Filled 2016-04-03: qty 2

## 2016-04-03 MED ORDER — SODIUM CHLORIDE 0.9 % IV BOLUS (SEPSIS)
500.0000 mL | Freq: Once | INTRAVENOUS | Status: AC
  Administered 2016-04-03: 500 mL via INTRAVENOUS

## 2016-04-03 NOTE — ED Provider Notes (Signed)
Her her her normal is  [facility] Provider Note   CSN: [id number] Arrival date & time: 04/03/16  1622  By signing my name below, I, Higinio Plan, attest that this documentation has been prepared under the direction and in the presence of Fredia Sorrow, MD . Electronically Signed: Higinio Plan, Scribe. 04/03/2016. 6:34 PM.  History   Chief Complaint Chief Complaint  Patient presents with  . Headache   The history is provided by the patient. No language interpreter was used.   HPI Comments: Kirsten Rice is a 70 y.o. female who presents to the Emergency Department complaining of gradually worsening, 8/10, right sided headache that began yesterday and worsened today. Pt reports she awoke from sleep yesterday morning with pain behind her right eye radiating into her right temple. She notes associated nausea. Pt reports her pain is exacerbated with movement. She states she has taken tylenol with no relief. She notes hx of ocular headaches and migraines but notes her current headache is different because she has not experienced an "aura" or vomiting. She states her ocular headaches have worsened over the past year; she reports episodes always occur behind her right eye. Pt states her last migraine occurred ~15 years ago.   Past Medical History:  Diagnosis Date  . Arthritis    "probably a little bit in my knees and hips" (01/31/2015)  . Basal cell carcinoma   . Chronic systolic CHF (congestive heart failure) ([place])    a. 01/2015 Echo: EF 25-35% in setting of takotsubo cardiomyopathy.  . Crohn's disease ([place])   . Deep vein thrombosis ([place])    pt denies: 04/08/13  . Essential hypertension   . History of pneumonia    "2-3 times" (01/31/2015)  . Hyperthyroidism    pt denies:  04/08/13  . Mitral valve prolapse    a. 01/2015 Echo: Ef 25-35%, triv MR.  . Myocardial infarct ([place])   . Ocular headache    "weekly" (01/31/2015)  . Persistent dry cough   . PONV (postoperative nausea and  vomiting) 1970's   "when I had my appendix out"  . Raynaud's disease   . Squamous carcinoma ([place])   . Takotsubo cardiomyopathy    a. 2007 Cath: nl cors, apical ballooning w/ subsequent recovery of LV fxn;  b. 01/2015 NSTEMI/Cath: nl cors, EF 25-25% w/ apical ballooning;  c. 01/2015 echo: EF 30%, septal, apical, mid anterior, inf HK, mildly dil LA/RA.  Marland Kitchen Telangiectasia     Patient Active Problem List   Diagnosis Date Noted  . Essential hypertension   . Heart murmur   . Persistent dry cough   . Chronic systolic CHF (congestive heart failure) ([place])   . Chronic lung disease 02/02/2015  . NSTEMI (non-ST elevated myocardial infarction) ([place]) 01/31/2015  . Crohn's disease ([place]) 01/31/2015  . Chronic night sweats 08/14/2014  . Raynaud phenomenon 07/03/2014  . Pulmonary infiltrate 07/03/2014  . Unexplained night sweats 07/03/2014  . Weight loss 07/03/2014  . Chronic cough 07/03/2014  . Stress-induced cardiomyopathy- new drop in EF- 25-35% 05/17/2010  . Takotsubo syndrome 04/30/2009  . MITRAL VALVE PROLAPSE 04/27/2009  . TELANGIECTASIA 04/27/2009  . ALLERGIC RHINITIS 04/27/2009  . DEEP VENOUS THROMBOPHLEBITIS, HX OF 04/27/2009  . RAYNAUD'S SYNDROME, HX OF 04/27/2009  . HYPERTHYROIDISM 02/21/2008  . SORE THROAT 02/21/2008  . GERD 02/21/2008    Past Surgical History:  Procedure Laterality Date  . APPENDECTOMY  1971  . BASAL CELL CARCINOMA EXCISION     forehead, back, legs, arms" (  01/31/2015)  . CARDIAC CATHETERIZATION  2006; 2007  . CARDIAC CATHETERIZATION N/A 01/31/2015   Procedure: Left Heart Cath and Coronary Angiography;  Surgeon: Jettie Booze, MD;  Location: [facility];  Service: Cardiovascular;  Laterality: N/A;  . COLONOSCOPY    . COLONOSCOPY  05/11/2012   Procedure: COLONOSCOPY;  Surgeon: Lafayette Dragon, MD;  Location: [facility];  Service: Endoscopy;  Laterality: N/A;  . SHOULDER ARTHROSCOPY W/ ROTATOR CUFF REPAIR Right 1996  . SQUAMOUS CELL CARCINOMA EXCISION  Left early 1980's   "eyelid"  . TUBAL LIGATION  1973    OB History    No data available     Home Medications    Prior to Admission medications   Medication Sig Start Date End Date Taking? Authorizing Provider  acetaminophen (TYLENOL) 325 MG tablet Take 650 mg by mouth every 6 (six) hours as needed for mild pain or headache.    Historical Provider, MD  albuterol (PROVENTIL HFA;VENTOLIN HFA) 108 (90 BASE) MCG/ACT inhaler Inhale 2 puffs into the lungs every 6 (six) hours as needed for wheezing or shortness of breath. 02/09/15   Rogelia Mire, NP  aspirin 81 MG tablet Take 81 mg by mouth daily.      Historical Provider, MD  balsalazide (COLAZAL) 750 MG capsule Take 2,250 mg by mouth 3 (three) times daily.  07/27/13   Lafayette Dragon, MD  bifidobacterium infantis (ALIGN) capsule Take 1 capsule by mouth daily.    Historical Provider, MD  Biotin 2500 MCG CAPS Take 2,500 mcg by mouth daily.     Historical Provider, MD  Calcium-Vitamin D 600-200 MG-UNIT per tablet Take 1 tablet by mouth daily.  02/01/13   Lafayette Dragon, MD  carvedilol (COREG) 3.125 MG tablet TAKE 1 TABLET BY MOUTH TWICE DAILY WITH A MEAL 10/17/15   Sherren Mocha, MD  clobetasol cream (TEMOVATE) [id number] % Apply 1 application topically at bedtime as needed (dry skin on hands).  01/26/15   Historical Provider, MD  fluticasone (FLONASE) 50 MCG/ACT nasal spray USE 2 SPRAYS IN EACH NOSTRIL DAILY AS NEEDED FOR CONGESTION. 02/02/15   Erlene Quan, PA-C  furosemide (LASIX) 20 MG tablet TAKE 1 TABLET BY MOUTH DAILY 07/02/15   Sherren Mocha, MD  nitroGLYCERIN (NITROSTAT) 0.4 MG SL tablet Place 1 tablet (0.4 mg total) under the tongue every 5 (five) minutes x 3 doses as needed for chest pain. 02/02/15   Erlene Quan, PA-C  ondansetron (ZOFRAN) 4 MG tablet Take 1 tablet (4 mg total) by mouth every 8 (eight) hours as needed for nausea or vomiting. 02/03/15   Lendon Colonel, NP  Polyethyl Glycol-Propyl Glycol (SYSTANE PRESERVATIVE FREE) 0.4-0.3 %  SOLN Place 1 Package into both eyes daily as needed (dryness). Only uses PRN    Historical Provider, MD  potassium chloride SA (K-DUR,KLOR-CON) 20 MEQ tablet TAKE 1 TABLET BY MOUTH EVERY DAY 01/15/16   Sherren Mocha, MD  PRESCRIPTION MEDICATION Apply 1 application topically daily as needed (rosasea). Finacea Cream, uses PRN    Historical Provider, MD  RESTASIS 0.05 % ophthalmic emulsion Place 1 drop into both eyes 2 (two) times daily.  05/30/15   Historical Provider, MD  vitamin C (ASCORBIC ACID) 500 MG tablet Take 500 mg by mouth daily.      Historical Provider, MD    Family History Family History  Problem Relation Age of Onset  . Aortic stenosis Father   . Hypertension Father   . Diabetes Sister   .  Hyperthyroidism Sister   . Hypertension Mother   . Diabetes Sister   . Hyperthyroidism Sister   . Hyperthyroidism Sister   . Hyperthyroidism Sister   . Colon cancer Neg Hx     Social History Social History  Substance Use Topics  . Smoking status: Never Smoker  . Smokeless tobacco: Never Used  . Alcohol use Yes     Comment: daily     Allergies   Cafergot [ergotamine-caffeine]; Compazine [prochlorperazine edisylate]; Ergotamine-caffeine; Prochlorperazine edisylate; and Tetanus-diphtheria toxoids td   Review of Systems Review of Systems  Constitutional: Negative for fever.  HENT: Negative for rhinorrhea and sore throat.   Eyes: Negative for visual disturbance.  Respiratory: Negative for cough and shortness of breath.   Cardiovascular: Negative for chest pain and leg swelling.  Gastrointestinal: Positive for nausea. Negative for abdominal pain, diarrhea and vomiting.  Genitourinary: Negative for dysuria.  Musculoskeletal: Positive for back pain. Negative for neck stiffness.  Skin: Negative for rash.  Neurological: Positive for headaches. Negative for weakness and numbness.  Hematological: Does not bruise/bleed easily.   Physical Exam Updated Vital Signs BP 177/89 (BP  Location: Left Arm)   Pulse 77   Temp 97.5 F (36.4 C) (Oral)   Resp 18   Wt 121 lb (54.9 kg)   SpO2 100%   BMI 18.67 kg/m   Physical Exam  Constitutional: She is oriented to person, place, and time.  HENT:  Head: Normocephalic and atraumatic.  Mouth/Throat: Oropharynx is clear and moist.  Moist mucous membranes   Eyes: EOM are normal. Pupils are equal, round, and reactive to light. No scleral icterus.  Eyes are tracking normally  Cardiovascular: Normal rate, regular rhythm and normal heart sounds.   Pulmonary/Chest:  Lungs clear to auscultation bilaterally   Abdominal: Bowel sounds are normal. There is no tenderness.  Musculoskeletal: She exhibits no edema, tenderness or deformity.  No swelling in ankles   Neurological: She is alert and oriented to person, place, and time. No cranial nerve deficit. She exhibits normal muscle tone. Coordination normal.  Skin: No rash noted. No erythema. No pallor.   ED Treatments / Results  Labs (all labs ordered are listed, but only abnormal results are displayed) Labs Reviewed  CBC  BASIC METABOLIC PANEL    EKG  EKG Interpretation None      Radiology Ct Angio Head W/cm &/or Wo Cm  Result Date: 04/03/2016 CLINICAL DATA:  Initial evaluation for right-sided headache for 2 days. EXAM: CT ANGIOGRAPHY HEAD TECHNIQUE: Multidetector CT imaging of the head was performed using the standard protocol during bolus administration of intravenous contrast. Multiplanar CT image reconstructions and MIPs were obtained to evaluate the vascular anatomy. CONTRAST:  100 cc of Isovue 370. COMPARISON:  Prior noncontrast head CT performed earlier the same day. FINDINGS: CTA HEAD Anterior circulation: Visualized distal cervical segments of the internal carotid arteries are widely patent. Petrous, cavernous, and supraclinoid segments are widely patent without flow limiting stenosis. A1 segments patent. Left A1 segment hypoplastic. Anterior communicating artery  normal. Anterior cerebral arteries well opacified to their distal aspects. M1 segments patent without stenosis or occlusion. MCA bifurcations normal. Distal MCA branches well opacified and symmetric. Posterior circulation: Vertebral arteries patent to the vertebrobasilar junction. Posterior inferior cerebral arteries patent proximally. Basilar artery widely patent to its distal aspect. Superior cerebral arteries patent bilaterally. Posterior cerebral arteries supplied mainly via the basilar artery and are well opacified to their distal aspects. Prominent left posterior communicating artery noted. Venous sinuses: Patent without evidence  for venous sinus thrombosis. Anatomic variants: No significant anatomic variant. No aneurysm or vascular malformation. Delayed phase:No pathologic enhancement. IMPRESSION: Normal CTA of the head and neck. No aneurysm or other vascular abnormality identified. Electronically Signed   By: Jeannine Boga M.D.   On: 04/03/2016 21:32   Ct Head Wo Contrast  Result Date: 04/03/2016 CLINICAL DATA:  Right-sided headache for 2 days. EXAM: CT HEAD WITHOUT CONTRAST TECHNIQUE: Contiguous axial images were obtained from the base of the skull through the vertex without intravenous contrast. COMPARISON:  CT head dated 08/28/2006 and brain MRI dated 04/13/2013. FINDINGS: Brain: There is mild age related parenchymal atrophy with commensurate dilatation of the ventricles and sulci. Ventricles are stable in size and configuration. Mild chronic small vessel ischemic change again noted within the deep periventricular white matter regions. There is no mass, hemorrhage, edema or other evidence of acute parenchymal abnormality. No extra-axial hemorrhage. Vascular: No abnormal vascular findings. Skull: No osseous abnormality. Sinuses/Orbits: Visualized upper paranasal sinuses and mastoid air cells are clear. Periorbital and retro-orbital soft tissues are unremarkable. Other: None IMPRESSION: 1. No  acute findings. No intracranial mass, hemorrhage or edema. Visualized paranasal sinuses are clear. 2. Mild chronic small vessel ischemic change within the white matter. Electronically Signed   By: Franki Cabot M.D.   On: 04/03/2016 17:06    Procedures Procedures (including critical care time)  Medications Ordered in ED Medications  0.9 %  sodium chloride infusion (not administered)  sodium chloride 0.9 % bolus 500 mL (0 mLs Intravenous Stopped 04/03/16 2100)  diphenhydrAMINE (BENADRYL) injection 12.5 mg (12.5 mg Intravenous Given 04/03/16 1905)  dexamethasone (DECADRON) injection 10 mg (10 mg Intravenous Given 04/03/16 1906)  metoCLOPramide (REGLAN) injection 10 mg (10 mg Intravenous Given 04/03/16 1906)  ondansetron (ZOFRAN) injection 4 mg (4 mg Intravenous Given 04/03/16 1906)  iopamidol (ISOVUE-370) 76 % injection 100 mL (100 mLs Intravenous Contrast Given 04/03/16 1942)  HYDROmorphone (DILAUDID) injection 1 mg (1 mg Intravenous Given 04/03/16 2102)     Initial Impression / Assessment and Plan / ED Course  I have reviewed the triage vital signs and the nursing notes.  Pertinent labs & imaging results that were available during my care of the patient were reviewed by me and considered in my medical decision making (see chart for details).  Clinical Course  DIAGNOSTIC STUDIES:  Oxygen Saturation is 100% on RA, normal by my interpretation.    COORDINATION OF CARE:  6:31 PM Discussed treatment plan with pt at bedside and pt agreed to plan.    IPatient improved with migraine cocktail Reglan Benadryl Decadron and one dose of Duramorph phone. CT scan without any acute findings. Also had CTA done which shows no evidence of any aneurysm. Patient's had a history of migraine headaches in the past and has a more recent history of ocular migraines. But this was somewhat different than what she's had recently. However I do think this is a migraine-like headache for her. personally performed the  services described in this documentation, which was scribed in my presence. The recorded information has been reviewed and is accurate.  patient's pain is significantly improved is down to like 2 out of 10. Patient stable for discharge home.   Final Clinical Impressions(s) / ED Diagnoses   Final diagnoses:  Migraine without status migrainosus, not intractable, unspecified migraine type    New Prescriptions New Prescriptions   No medications on file  I personally performed the services described in this documentation, which was scribed in my presence.  The recorded information has been reviewed and is accurate.     Fredia Sorrow, MD 04/03/16 2153

## 2016-04-03 NOTE — ED Triage Notes (Signed)
C/o right side HA -started yesterday am-NAD-steady gait

## 2016-04-03 NOTE — ED Notes (Signed)
Amb to BR w/o difficulty 

## 2016-04-03 NOTE — Discharge Instructions (Signed)
CT of head and CT angios head without any acute findings. Symptoms seem to be consistent with a migraine-like headache. Go home and rest. You have been given some long-acting medicines here. If headache is not resolved or gets worse return. Make an appointment to follow-up with your regular doctor.

## 2016-04-04 ENCOUNTER — Encounter (HOSPITAL_COMMUNITY): Payer: Self-pay

## 2016-04-07 ENCOUNTER — Telehealth: Payer: Self-pay | Admitting: Cardiovascular Disease

## 2016-04-07 NOTE — Telephone Encounter (Signed)
Agree with plan as outlined. thx 

## 2016-04-07 NOTE — Telephone Encounter (Signed)
Left message on machine for pt to contact the office.   

## 2016-04-07 NOTE — Telephone Encounter (Signed)
New message   Pt c/o BP issue: STAT if pt c/o blurred vision, one-sided weakness or slurred speech  1. What are your last 5 BP readings? 204/90  2. Are you having any other symptoms (ex. Dizziness, headache, blurred vision, passed out)? no  3. What is your BP issue? bp is high

## 2016-04-07 NOTE — Telephone Encounter (Signed)
I spoke with the pt and she called the office to make [place] aware that when she was in the ER for a migraine her BP was elevated. Since her ER visit she has monitored her BP at home and it has ranged from 109/63-155/89.  I advised the pt to monitor her BP for the next couple of weeks and send readings through My Chart for Dr Burt Knack to review. The pt is also followed in the cardiac rehab maintenance program and she will have her BP monitored.  Pt agreed with plan.

## 2016-04-08 ENCOUNTER — Encounter (HOSPITAL_COMMUNITY): Payer: Self-pay

## 2016-04-10 ENCOUNTER — Encounter (HOSPITAL_COMMUNITY)
Admission: RE | Admit: 2016-04-10 | Discharge: 2016-04-10 | Disposition: A | Discharge status: Home / Self Care | Payer: Self-pay | Source: Ambulatory Visit | Attending: Cardiovascular Disease | Admitting: Cardiovascular Disease

## 2016-04-11 ENCOUNTER — Encounter (HOSPITAL_COMMUNITY)
Admission: RE | Admit: 2016-04-11 | Discharge: 2016-04-11 | Disposition: A | Discharge status: Home / Self Care | Payer: Medicare Other | Source: Ambulatory Visit | Attending: Cardiovascular Disease | Admitting: Cardiovascular Disease

## 2016-04-15 ENCOUNTER — Encounter (HOSPITAL_COMMUNITY)
Admission: RE | Admit: 2016-04-15 | Discharge: 2016-04-15 | Disposition: A | Discharge status: Home / Self Care | Payer: Self-pay | Source: Ambulatory Visit | Attending: Cardiovascular Disease | Admitting: Cardiovascular Disease

## 2016-04-17 ENCOUNTER — Encounter (HOSPITAL_COMMUNITY)
Admission: RE | Admit: 2016-04-17 | Discharge: 2016-04-17 | Disposition: A | Discharge status: Home / Self Care | Payer: Self-pay | Source: Ambulatory Visit | Attending: Cardiovascular Disease | Admitting: Cardiovascular Disease

## 2016-04-18 ENCOUNTER — Encounter (HOSPITAL_COMMUNITY)
Admission: RE | Admit: 2016-04-18 | Discharge: 2016-04-18 | Disposition: A | Discharge status: Home / Self Care | Payer: Self-pay | Source: Ambulatory Visit | Attending: Cardiovascular Disease | Admitting: Cardiovascular Disease

## 2016-04-22 ENCOUNTER — Encounter (HOSPITAL_COMMUNITY): Payer: Self-pay

## 2016-04-22 DIAGNOSIS — I214 Non-ST elevation (NSTEMI) myocardial infarction: Secondary | ICD-10-CM | POA: Insufficient documentation

## 2016-04-22 DIAGNOSIS — I5181 Takotsubo syndrome: Secondary | ICD-10-CM | POA: Insufficient documentation

## 2016-04-24 ENCOUNTER — Encounter (HOSPITAL_COMMUNITY)
Admission: RE | Admit: 2016-04-24 | Discharge: 2016-04-24 | Disposition: A | Discharge status: Home / Self Care | Payer: Self-pay | Source: Ambulatory Visit | Attending: Cardiovascular Disease | Admitting: Cardiovascular Disease

## 2016-04-29 ENCOUNTER — Encounter (HOSPITAL_COMMUNITY): Payer: Self-pay

## 2016-05-01 ENCOUNTER — Encounter (HOSPITAL_COMMUNITY)
Admission: RE | Admit: 2016-05-01 | Discharge: 2016-05-01 | Disposition: A | Discharge status: Home / Self Care | Payer: Self-pay | Source: Ambulatory Visit | Attending: Cardiovascular Disease | Admitting: Cardiovascular Disease

## 2016-05-02 ENCOUNTER — Encounter (HOSPITAL_COMMUNITY)
Admission: RE | Admit: 2016-05-02 | Discharge: 2016-05-02 | Disposition: A | Discharge status: Home / Self Care | Payer: Self-pay | Source: Ambulatory Visit | Attending: Cardiovascular Disease | Admitting: Cardiovascular Disease

## 2016-05-06 ENCOUNTER — Encounter (HOSPITAL_COMMUNITY)
Admission: RE | Admit: 2016-05-06 | Discharge: 2016-05-06 | Disposition: A | Discharge status: Home / Self Care | Payer: Self-pay | Source: Ambulatory Visit | Attending: Cardiovascular Disease | Admitting: Cardiovascular Disease

## 2016-05-08 ENCOUNTER — Encounter (HOSPITAL_COMMUNITY)
Admission: RE | Admit: 2016-05-08 | Discharge: 2016-05-08 | Disposition: A | Discharge status: Home / Self Care | Payer: Self-pay | Source: Ambulatory Visit | Attending: Cardiovascular Disease | Admitting: Cardiovascular Disease

## 2016-05-09 ENCOUNTER — Encounter (HOSPITAL_COMMUNITY): Payer: Self-pay

## 2016-05-13 ENCOUNTER — Encounter (HOSPITAL_COMMUNITY): Payer: Self-pay

## 2016-05-15 ENCOUNTER — Encounter (HOSPITAL_COMMUNITY)
Admission: RE | Admit: 2016-05-15 | Discharge: 2016-05-15 | Disposition: A | Discharge status: Home / Self Care | Payer: Self-pay | Source: Ambulatory Visit | Attending: Cardiovascular Disease | Admitting: Cardiovascular Disease

## 2016-05-16 ENCOUNTER — Encounter (HOSPITAL_COMMUNITY)
Admission: RE | Admit: 2016-05-16 | Discharge: 2016-05-16 | Disposition: A | Discharge status: Home / Self Care | Payer: Self-pay | Source: Ambulatory Visit | Attending: Cardiovascular Disease | Admitting: Cardiovascular Disease

## 2016-05-20 ENCOUNTER — Encounter (HOSPITAL_COMMUNITY)
Admission: RE | Admit: 2016-05-20 | Discharge: 2016-05-20 | Disposition: A | Discharge status: Home / Self Care | Payer: Self-pay | Source: Ambulatory Visit | Attending: Cardiovascular Disease | Admitting: Cardiovascular Disease

## 2016-05-22 ENCOUNTER — Encounter (HOSPITAL_COMMUNITY)
Admission: RE | Admit: 2016-05-22 | Discharge: 2016-05-22 | Disposition: A | Discharge status: Home / Self Care | Payer: Self-pay | Source: Ambulatory Visit | Attending: Cardiovascular Disease | Admitting: Cardiovascular Disease

## 2016-05-22 DIAGNOSIS — I5181 Takotsubo syndrome: Secondary | ICD-10-CM | POA: Insufficient documentation

## 2016-05-22 DIAGNOSIS — I214 Non-ST elevation (NSTEMI) myocardial infarction: Secondary | ICD-10-CM | POA: Insufficient documentation

## 2016-05-23 ENCOUNTER — Encounter (HOSPITAL_COMMUNITY): Payer: Self-pay

## 2016-05-24 ENCOUNTER — Other Ambulatory Visit: Payer: Self-pay | Admitting: Cardiovascular Disease

## 2016-05-27 ENCOUNTER — Encounter (HOSPITAL_COMMUNITY): Payer: Self-pay

## 2016-05-29 ENCOUNTER — Encounter (HOSPITAL_COMMUNITY): Payer: Self-pay

## 2016-05-30 ENCOUNTER — Encounter (HOSPITAL_COMMUNITY): Payer: Self-pay

## 2016-06-03 ENCOUNTER — Encounter (HOSPITAL_COMMUNITY): Payer: Self-pay

## 2016-06-05 ENCOUNTER — Encounter (HOSPITAL_COMMUNITY): Payer: Self-pay

## 2016-06-06 ENCOUNTER — Encounter (HOSPITAL_COMMUNITY): Payer: Self-pay

## 2016-06-10 ENCOUNTER — Encounter (HOSPITAL_COMMUNITY)
Admission: RE | Admit: 2016-06-10 | Discharge: 2016-06-10 | Disposition: A | Discharge status: Home / Self Care | Payer: Self-pay | Source: Ambulatory Visit | Attending: Cardiovascular Disease | Admitting: Cardiovascular Disease

## 2016-06-17 ENCOUNTER — Encounter (HOSPITAL_COMMUNITY)
Admission: RE | Admit: 2016-06-17 | Discharge: 2016-06-17 | Disposition: A | Discharge status: Home / Self Care | Payer: Self-pay | Source: Ambulatory Visit | Attending: Cardiovascular Disease | Admitting: Cardiovascular Disease

## 2016-06-17 ENCOUNTER — Ambulatory Visit (INDEPENDENT_AMBULATORY_CARE_PROVIDER_SITE_OTHER)
Admission: RE | Admit: 2016-06-17 | Discharge: 2016-06-17 | Disposition: A | Discharge status: Home / Self Care | Payer: Medicare Other | Source: Ambulatory Visit | Attending: Internal Medicine | Admitting: Internal Medicine

## 2016-06-17 DIAGNOSIS — R918 Other nonspecific abnormal finding of lung field: Secondary | ICD-10-CM | POA: Diagnosis not present

## 2016-06-19 ENCOUNTER — Encounter (HOSPITAL_COMMUNITY)
Admission: RE | Admit: 2016-06-19 | Discharge: 2016-06-19 | Disposition: A | Discharge status: Home / Self Care | Payer: Self-pay | Source: Ambulatory Visit | Attending: Cardiovascular Disease | Admitting: Cardiovascular Disease

## 2016-06-20 ENCOUNTER — Encounter (HOSPITAL_COMMUNITY)
Admission: RE | Admit: 2016-06-20 | Discharge: 2016-06-20 | Disposition: A | Discharge status: Home / Self Care | Payer: Self-pay | Source: Ambulatory Visit | Attending: Cardiovascular Disease | Admitting: Cardiovascular Disease

## 2016-06-20 DIAGNOSIS — I5181 Takotsubo syndrome: Secondary | ICD-10-CM | POA: Insufficient documentation

## 2016-06-20 DIAGNOSIS — I214 Non-ST elevation (NSTEMI) myocardial infarction: Secondary | ICD-10-CM | POA: Insufficient documentation

## 2016-06-24 ENCOUNTER — Encounter (HOSPITAL_COMMUNITY)
Admission: RE | Admit: 2016-06-24 | Discharge: 2016-06-24 | Disposition: A | Discharge status: Home / Self Care | Payer: Self-pay | Source: Ambulatory Visit | Attending: Cardiovascular Disease | Admitting: Cardiovascular Disease

## 2016-06-26 ENCOUNTER — Encounter (HOSPITAL_COMMUNITY): Payer: Self-pay

## 2016-06-27 ENCOUNTER — Encounter (HOSPITAL_COMMUNITY): Payer: Self-pay

## 2016-07-01 ENCOUNTER — Encounter (HOSPITAL_COMMUNITY): Payer: Self-pay

## 2016-07-03 ENCOUNTER — Encounter (HOSPITAL_COMMUNITY): Payer: Self-pay

## 2016-07-04 ENCOUNTER — Encounter (HOSPITAL_COMMUNITY): Payer: Self-pay

## 2016-07-08 ENCOUNTER — Encounter (HOSPITAL_COMMUNITY): Payer: Self-pay

## 2016-07-08 DIAGNOSIS — G43109 Migraine with aura, not intractable, without status migrainosus: Secondary | ICD-10-CM | POA: Insufficient documentation

## 2016-07-10 ENCOUNTER — Encounter (HOSPITAL_COMMUNITY): Payer: Self-pay

## 2016-07-11 ENCOUNTER — Encounter (HOSPITAL_COMMUNITY): Payer: Self-pay

## 2016-07-15 ENCOUNTER — Encounter (HOSPITAL_COMMUNITY): Payer: Self-pay

## 2016-07-16 ENCOUNTER — Telehealth: Payer: Self-pay | Admitting: Internal Medicine

## 2016-07-16 DIAGNOSIS — R918 Other nonspecific abnormal finding of lung field: Secondary | ICD-10-CM

## 2016-07-16 NOTE — Telephone Encounter (Signed)
Ct again no change in 1 year. Nov 2016 -> nov 2017. Not seen her in a while so please give her first avail appt to do general review. If reluctant then order CT in nov 2018 and she can see me for fu then  .................  COMPARISON:  06/18/2015  FINDINGS: Cardiovascular: Aortic and branch vessel atherosclerosis. Cardiomegaly, accentuated by a moderate pectus excavatum deformity. No pericardial effusion.  Mediastinum/Nodes: No mediastinal or definite hilar adenopathy, given limitations of unenhanced CT.  Lungs/Pleura: No pleural fluid. Minimal biapical pleural-parenchymal scarring.  Volume loss in the lingula. Left base scarring. Right middle lobe and lingular peribronchovascular nodularity and mucoid impaction are not significantly changed.  Upper Abdomen: Normal imaged portions of the liver, spleen, stomach, pancreas, adrenal glands, kidneys.  Musculoskeletal: Moderate pectus excavatum deformity. Cervical and thoracic spondylosis.  IMPRESSION: 1. 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 2. Cardiomegaly, accentuated by a pectus excavatum deformity.   Electronically Signed   By: Abigail Miyamoto M.D.   On: 06/17/2016 12:12

## 2016-07-17 ENCOUNTER — Encounter (HOSPITAL_COMMUNITY): Payer: Self-pay

## 2016-07-18 ENCOUNTER — Encounter (HOSPITAL_COMMUNITY): Payer: Self-pay

## 2016-07-18 NOTE — Telephone Encounter (Signed)
Patient is returning phone call.  °

## 2016-07-18 NOTE — Telephone Encounter (Signed)
lmtcb x1 for pt. 

## 2016-07-18 NOTE — Telephone Encounter (Signed)
Spoke with pt, aware of results/recs.  rov scheduled next available with MR.  Ct chest for 05/2017 ordered.  Nothing further needed.

## 2016-07-22 ENCOUNTER — Encounter (HOSPITAL_COMMUNITY): Payer: Self-pay

## 2016-07-22 DIAGNOSIS — I5181 Takotsubo syndrome: Secondary | ICD-10-CM | POA: Insufficient documentation

## 2016-07-22 DIAGNOSIS — I214 Non-ST elevation (NSTEMI) myocardial infarction: Secondary | ICD-10-CM | POA: Insufficient documentation

## 2016-07-24 ENCOUNTER — Encounter (HOSPITAL_COMMUNITY)
Admission: RE | Admit: 2016-07-24 | Discharge: 2016-07-24 | Disposition: A | Discharge status: Home / Self Care | Payer: Self-pay | Source: Ambulatory Visit | Attending: Cardiovascular Disease | Admitting: Cardiovascular Disease

## 2016-07-25 ENCOUNTER — Encounter (HOSPITAL_COMMUNITY): Payer: Self-pay

## 2016-07-29 ENCOUNTER — Encounter (HOSPITAL_COMMUNITY): Payer: Self-pay

## 2016-07-31 ENCOUNTER — Encounter (HOSPITAL_COMMUNITY)
Admission: RE | Admit: 2016-07-31 | Discharge: 2016-07-31 | Disposition: A | Discharge status: Home / Self Care | Payer: Self-pay | Source: Ambulatory Visit | Attending: Cardiovascular Disease | Admitting: Cardiovascular Disease

## 2016-08-01 ENCOUNTER — Encounter (HOSPITAL_COMMUNITY)
Admission: RE | Admit: 2016-08-01 | Discharge: 2016-08-01 | Disposition: A | Discharge status: Home / Self Care | Payer: Self-pay | Source: Ambulatory Visit | Attending: Cardiovascular Disease | Admitting: Cardiovascular Disease

## 2016-08-04 DIAGNOSIS — M859 Disorder of bone density and structure, unspecified: Secondary | ICD-10-CM | POA: Diagnosis not present

## 2016-08-05 ENCOUNTER — Encounter (HOSPITAL_COMMUNITY)
Admission: RE | Admit: 2016-08-05 | Discharge: 2016-08-05 | Disposition: A | Discharge status: Home / Self Care | Payer: Self-pay | Source: Ambulatory Visit | Attending: Cardiovascular Disease | Admitting: Cardiovascular Disease

## 2016-08-07 ENCOUNTER — Encounter (HOSPITAL_COMMUNITY): Payer: Self-pay

## 2016-08-08 ENCOUNTER — Encounter (HOSPITAL_COMMUNITY): Payer: Self-pay

## 2016-08-12 ENCOUNTER — Encounter (HOSPITAL_COMMUNITY)
Admission: RE | Admit: 2016-08-12 | Discharge: 2016-08-12 | Disposition: A | Discharge status: Home / Self Care | Payer: Self-pay | Source: Ambulatory Visit | Attending: Cardiovascular Disease | Admitting: Cardiovascular Disease

## 2016-08-14 ENCOUNTER — Encounter (HOSPITAL_COMMUNITY)
Admission: RE | Admit: 2016-08-14 | Discharge: 2016-08-14 | Disposition: A | Discharge status: Home / Self Care | Payer: Self-pay | Source: Ambulatory Visit | Attending: Cardiovascular Disease | Admitting: Cardiovascular Disease

## 2016-08-15 ENCOUNTER — Encounter (HOSPITAL_COMMUNITY)
Admission: RE | Admit: 2016-08-15 | Discharge: 2016-08-15 | Disposition: A | Discharge status: Home / Self Care | Payer: Self-pay | Source: Ambulatory Visit | Attending: Cardiovascular Disease | Admitting: Cardiovascular Disease

## 2016-08-19 ENCOUNTER — Encounter (HOSPITAL_COMMUNITY): Payer: Self-pay

## 2016-08-21 ENCOUNTER — Encounter (HOSPITAL_COMMUNITY)
Admission: RE | Admit: 2016-08-21 | Discharge: 2016-08-21 | Disposition: A | Discharge status: Home / Self Care | Payer: Self-pay | Source: Ambulatory Visit | Attending: Cardiovascular Disease | Admitting: Cardiovascular Disease

## 2016-08-21 DIAGNOSIS — I5181 Takotsubo syndrome: Secondary | ICD-10-CM | POA: Insufficient documentation

## 2016-08-21 DIAGNOSIS — I214 Non-ST elevation (NSTEMI) myocardial infarction: Secondary | ICD-10-CM | POA: Insufficient documentation

## 2016-08-22 ENCOUNTER — Encounter (HOSPITAL_COMMUNITY): Payer: Self-pay

## 2016-08-25 ENCOUNTER — Ambulatory Visit (INDEPENDENT_AMBULATORY_CARE_PROVIDER_SITE_OTHER): Payer: Medicare Other | Admitting: Internal Medicine

## 2016-08-25 ENCOUNTER — Encounter: Payer: Self-pay | Admitting: Internal Medicine

## 2016-08-25 VITALS — BP 136/80 | HR 65 | Ht 67.5 in

## 2016-08-25 DIAGNOSIS — R918 Other nonspecific abnormal finding of lung field: Secondary | ICD-10-CM

## 2016-08-25 NOTE — Patient Instructions (Signed)
ICD-9-CM ICD-10-CM   1. Pulmonary infiltrate 793.19 R91.8     Glad doiong well without symptoms CT chest stable nov 215-> nov 2016 -> nov 2017  Plan Cancel nov 2018 ct chest Return in 1 year for cxr  2 view and office visit

## 2016-08-25 NOTE — Progress Notes (Signed)
Subjective:     Patient ID: Kirsten Rice, female   DOB: 08-Feb-1946, 71 y.o.   MRN: [id number]  HPI  OV 10/06/2014  Chief Complaint  Patient presents with  . Follow-up    Pt stated her cough is infrequent and only coughs every now and then. Pt c/o left sinus pressure, rhinorrhea, mild cough, headache. pt is contributing her s/s to seasonal allergies. Pt denies SOB. Pt states she has chest tightness occassional while lying down.      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 At that time plan was to stop ACE inhibitor and started nasal steroids and to follow up today to see how the cough is doing. However ACE inhibitor has not been stopped. This is apparently due to the reason that I forgot to communicate with Dr. Sherren Mocha about a substitute. She is taking nasal steroids at this point. In any event her cough is not an issue anymore and is largely resolved according to her history. However for the past one week she is complaining of some left facial pain and headache. She also continues to have chronic night sweats.   OV 08/25/2016  Chief Complaint  Patient presents with  . Follow-up    Pt states she feels she is doing well. Pt states she has occassional hoarsness. Pt denies cough, SOB, CP/tightness.     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 In the interim November 2016 and November 2017 she had CT scan of the chest that showed bilateral scattered infiltrates consistent with MAI infection [I personally visualized the CT chest]. Currently in the interim she says she has no dyspnea or cough. She only has occasional hoarseness. This is associated with Lasix. She is off ace inhibitors. She's feeling well. Is no weight loss and no new medical problems. Overall she's had a previous stability with CT  chest    has a past medical history of Arthritis; Basal cell carcinoma; Chronic systolic CHF (congestive heart failure) ([place]); Crohn's disease ([place]); Deep vein thrombosis ([place]); Essential hypertension; History of pneumonia; Hyperthyroidism; Mitral valve prolapse; Myocardial infarct; Ocular headache; Persistent dry cough; PONV (postoperative nausea and vomiting) (1970's); Raynaud's disease; Squamous carcinoma; Takotsubo cardiomyopathy; and Telangiectasia.   reports that she has never smoked. She has never used smokeless tobacco.  Past Surgical History:  Procedure Laterality Date  . APPENDECTOMY  1971  . BASAL CELL CARCINOMA EXCISION     forehead, back, legs, arms" (01/31/2015)  . CARDIAC CATHETERIZATION  2006; 2007  . CARDIAC CATHETERIZATION N/A 01/31/2015   Procedure: Left Heart Cath and Coronary Angiography;  Surgeon: Jettie Booze, MD;  Location: [facility];  Service: Cardiovascular;  Laterality: N/A;  . COLONOSCOPY    . COLONOSCOPY  05/11/2012   Procedure: COLONOSCOPY;  Surgeon: Lafayette Dragon, MD;  Location: [facility];  Service: Endoscopy;  Laterality: N/A;  . SHOULDER ARTHROSCOPY W/ ROTATOR CUFF REPAIR Right 1996  . SQUAMOUS CELL CARCINOMA EXCISION Left early 1980's   "eyelid"  . TUBAL LIGATION  1973    Allergies  Allergen Reactions  . Cafergot [Ergotamine-Caffeine] Other (See Comments)    Vagal paralysis  . Compazine [Prochlorperazine Edisylate] Other (See Comments)    Other reaction(s): stroke like sx paralysis of face  . Ergotamine-Caffeine     Other reaction(s): vagal response  . Prochlorperazine Edisylate Other (See  Comments)    REACTION: extrapyrimadal syndrome  . Tetanus-Diphtheria Toxoids Td Swelling    REACTION: arm swells. Back in the 1970's when made with horse serum. Has had tetanus shot since then without problem.    Immunization History  Administered Date(s) Administered  . Influenza Split 04/20/2014  . Influenza Whole 04/11/2009  .  Influenza, High Dose Seasonal PF 02/19/2016  . PPD Test 02/08/2013, 02/07/2014  . Pneumococcal Conjugate-13 10/19/2013    Family History  Problem Relation Age of Onset  . Aortic stenosis Father   . Hypertension Father   . Diabetes Sister   . Hyperthyroidism Sister   . Hypertension Mother   . Diabetes Sister   . Hyperthyroidism Sister   . Hyperthyroidism Sister   . Hyperthyroidism Sister   . Colon cancer Neg Hx      Current Outpatient Prescriptions:  .  acetaminophen (TYLENOL) 325 MG tablet, Take 650 mg by mouth every 6 (six) hours as needed for mild pain or headache., Disp: , Rfl:  .  aspirin 81 MG tablet, Take 81 mg by mouth daily.  , Disp: , Rfl:  .  balsalazide (COLAZAL) 750 MG capsule, Take 2,250 mg by mouth 3 (three) times daily. , Disp: , Rfl:  .  bifidobacterium infantis (ALIGN) capsule, Take 1 capsule by mouth daily., Disp: , Rfl:  .  Biotin 2500 MCG CAPS, Take 2,500 mcg by mouth daily. , Disp: , Rfl:  .  Calcium-Vitamin D 600-200 MG-UNIT per tablet, Take 1 tablet by mouth daily. , Disp: , Rfl:  .  carvedilol (COREG) 3.125 MG tablet, TAKE 1 TABLET BY MOUTH TWICE DAILY WITH A MEAL, Disp: 180 tablet, Rfl: 3 .  clobetasol cream (TEMOVATE) 6.62 %, Apply 1 application topically at bedtime as needed (dry skin on hands). , Disp: , Rfl: 0 .  fluticasone (FLONASE) 50 MCG/ACT nasal spray, USE 2 SPRAYS IN EACH NOSTRIL DAILY AS NEEDED FOR CONGESTION., Disp: 16 g, Rfl: 5 .  furosemide (LASIX) 20 MG tablet, TAKE 1 TABLET BY MOUTH DAILY, Disp: 90 tablet, Rfl: 1 .  nitroGLYCERIN (NITROSTAT) 0.4 MG SL tablet, Place 1 tablet (0.4 mg total) under the tongue every 5 (five) minutes x 3 doses as needed for chest pain., Disp: 25 tablet, Rfl: 2 .  potassium chloride SA (K-DUR,KLOR-CON) 20 MEQ tablet, TAKE 1 TABLET BY MOUTH EVERY DAY, Disp: 90 tablet, Rfl: 2 .  PRESCRIPTION MEDICATION, Apply 1 application topically daily as needed (rosasea). Finacea Cream, uses PRN, Disp: , Rfl:  .  RESTASIS 0.05 %  ophthalmic emulsion, Place 1 drop into both eyes 2 (two) times daily. , Disp: , Rfl: 3 .  vitamin C (ASCORBIC ACID) 500 MG tablet, Take 500 mg by mouth daily.  , Disp: , Rfl:    Review of Systems     Objective:   Physical Exam  Constitutional: She is oriented to person, place, and time. She appears well-developed and well-nourished. No distress.  HENT:  Head: Normocephalic and atraumatic.  Right Ear: External ear normal.  Left Ear: External ear normal.  Mouth/Throat: Oropharynx is clear and moist. No oropharyngeal exudate.  Eyes: Conjunctivae and EOM are normal. Pupils are equal, round, and reactive to light. Right eye exhibits no discharge. Left eye exhibits no discharge. No scleral icterus.  Neck: Normal range of motion. Neck supple. No JVD present. No tracheal deviation present. No thyromegaly present.  Cardiovascular: Normal rate, regular rhythm, normal heart sounds and intact distal pulses.  Exam reveals no gallop and no friction rub.  No murmur heard. Pulmonary/Chest: Effort normal and breath sounds normal. No respiratory distress. She has no wheezes. She has no rales. She exhibits no tenderness.  Abdominal: Soft. Bowel sounds are normal. She exhibits no distension and no mass. There is no tenderness. There is no rebound and no guarding.  Musculoskeletal: Normal range of motion. She exhibits no edema or tenderness.  Lymphadenopathy:    She has no cervical adenopathy.  Neurological: She is alert and oriented to person, place, and time. She has normal reflexes. No cranial nerve deficit. She exhibits normal muscle tone. Coordination normal.  Skin: Skin is warm and dry. No rash noted. She is not diaphoretic. No erythema. No pallor.  Psychiatric: She has a normal mood and affect. Her behavior is normal. Judgment and thought content normal.  Vitals reviewed.  Vitals:   08/25/16 1134  BP: 136/80  Pulse: 65  SpO2: 97%  Height: 5' 7.5" (1.715 m)    Estimated body mass index is  18.67 kg/m as calculated from the following:   Height as of 09/19/15: 5' 7.5" (1.715 m).   Weight as of 04/03/16: 121 lb (54.9 kg).     Assessment:       ICD-9-CM ICD-10-CM   1. Pulmonary infiltrate 793.19 R91.8       Plan:     - Currently asymptomatic pulmonary infiltrates that are very suggestive of MAI infection. Given her asymptomatic state and stability of infiltrates over 2 years we'll just do an expectant approach. We'll see her in one year with a chest x-ray. She'll return sooner if needed   Dr. Brand Males, M.D., [facility].C.P Pulmonary and Critical Care Medicine Staff Physician [place] [facility] Pager: [phone], If no answer or between  15:00h - 7:00h: call [phone]  08/25/2016 12:02 PM

## 2016-08-26 ENCOUNTER — Encounter (HOSPITAL_COMMUNITY)
Admission: RE | Admit: 2016-08-26 | Discharge: 2016-08-26 | Disposition: A | Discharge status: Home / Self Care | Payer: Self-pay | Source: Ambulatory Visit | Attending: Cardiovascular Disease | Admitting: Cardiovascular Disease

## 2016-08-27 IMAGING — CT CT CHEST W/ CM
2 of 3 series · 16 of 30 positions shown, 18 images · IV contrast (75CC OMNI 300)
Comparison: CT abdomen pelvis 03/13/2014, 09/13/2013 and
08/15/2013.

CLINICAL DATA: Followup pulmonary nodule. Shortness of breath and
cough.

EXAM:
CT CHEST WITH CONTRAST
TECHNIQUE: Multidetector CT imaging of the chest was performed during
intravenous contrast administration.
CONTRAST:  75mL OMNIPAQUE IOHEXOL 300 MG/ML  SOLN

[Series 3: chest with · axial · 0.60mm/px · z∈[-266,+4]mm · 8 of 70 slices shown, 10 images]
[im 8/70  mediastinal]
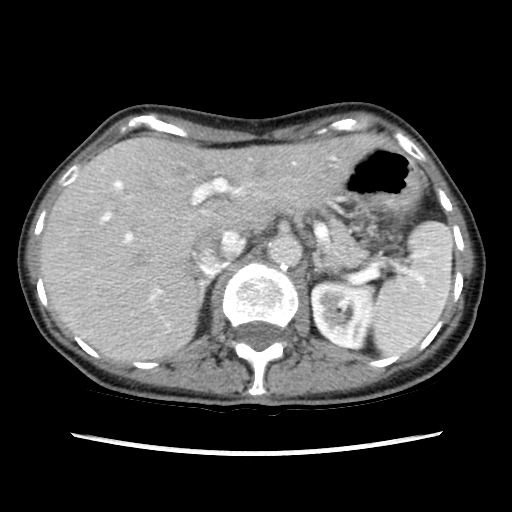
[im 8/70  lung]
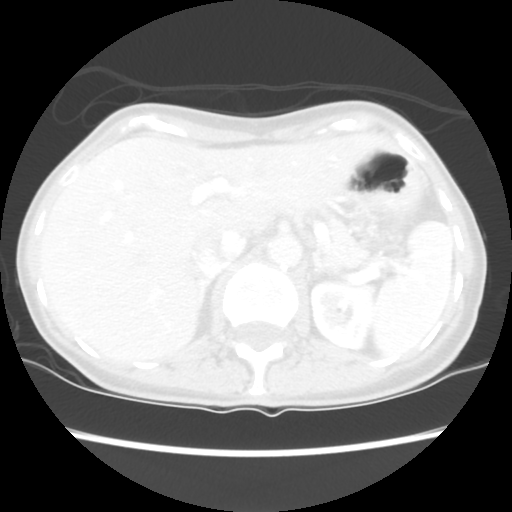
[im 16/70  lung]
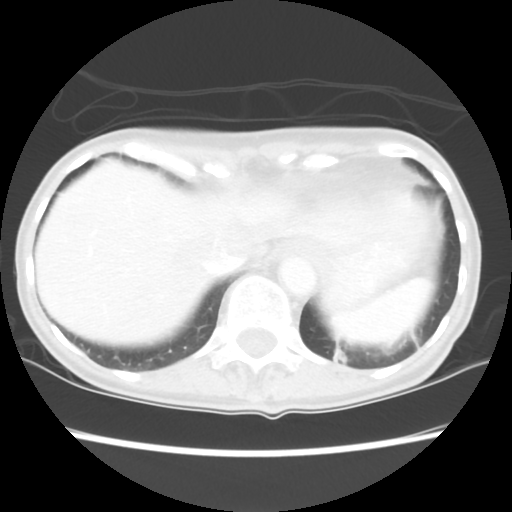
[im 24/70  lung]
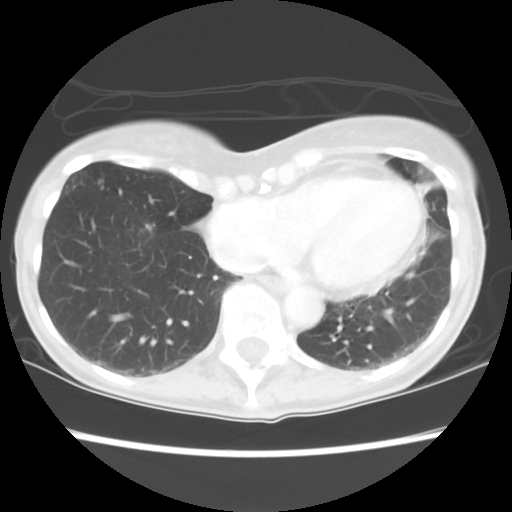
[im 31/70  lung]
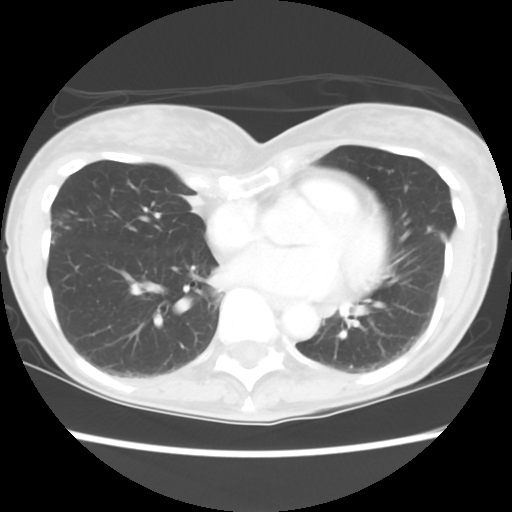
[im 39/70  mediastinal]
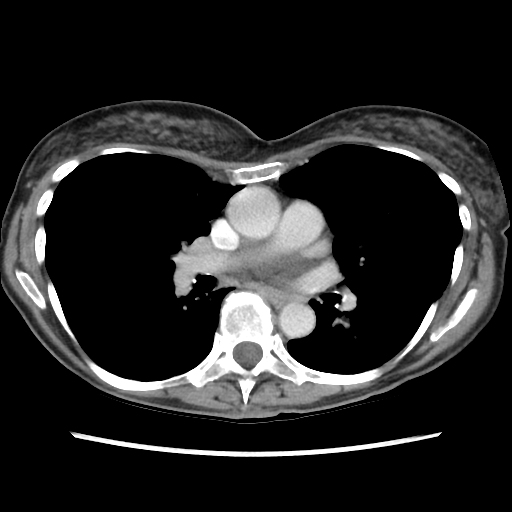
[im 39/70  lung]
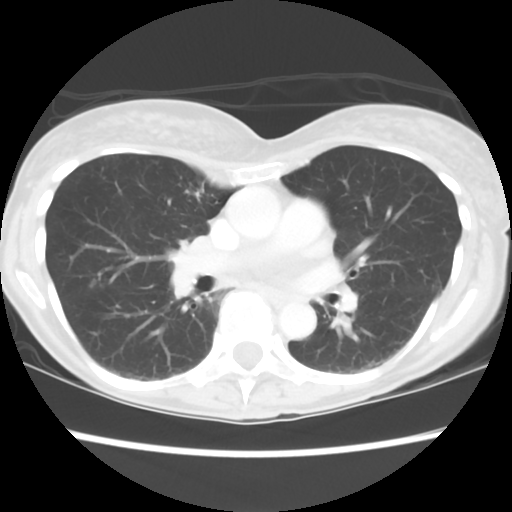
[im 47/70  lung]
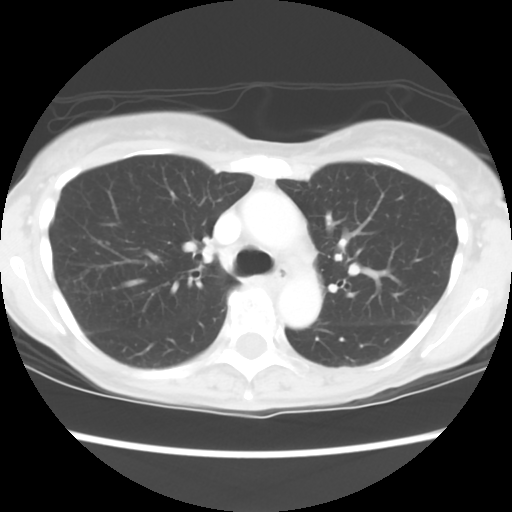
[im 54/70  lung]
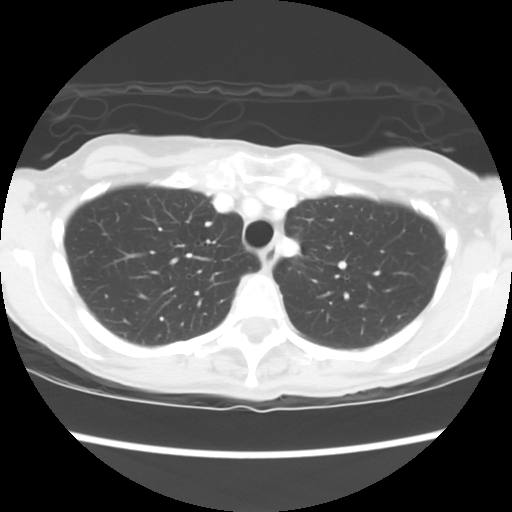
[im 62/70  lung]
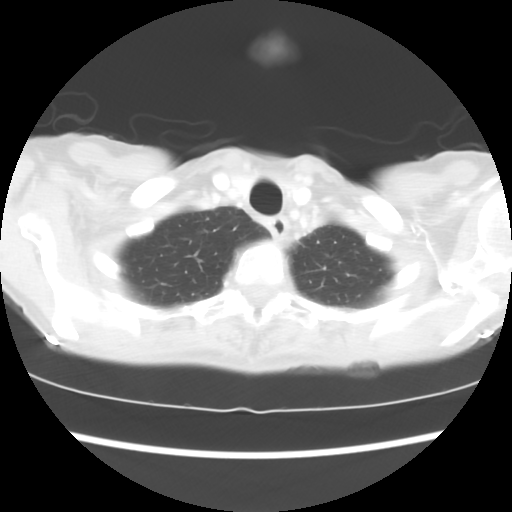

[Series 602: sagittal body · sagittal · 0.68mm/px · 8 of 123 slices shown]
[im 15/123  mediastinal]
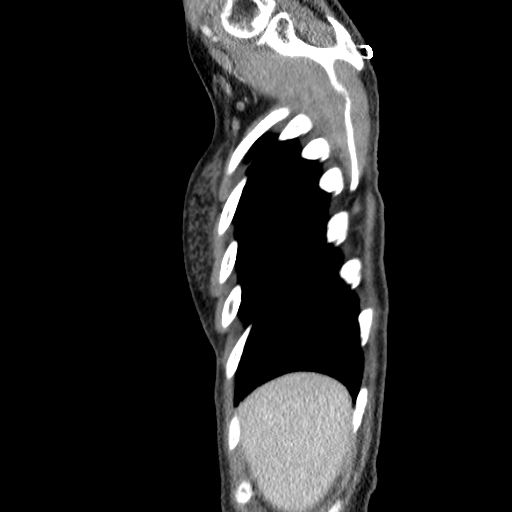
[im 29/123  mediastinal]
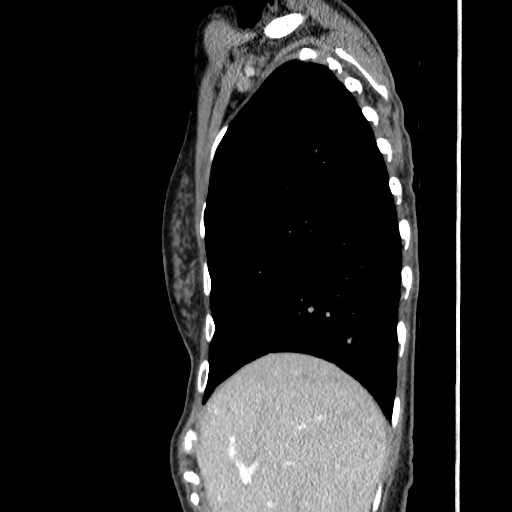
[im 44/123  mediastinal]
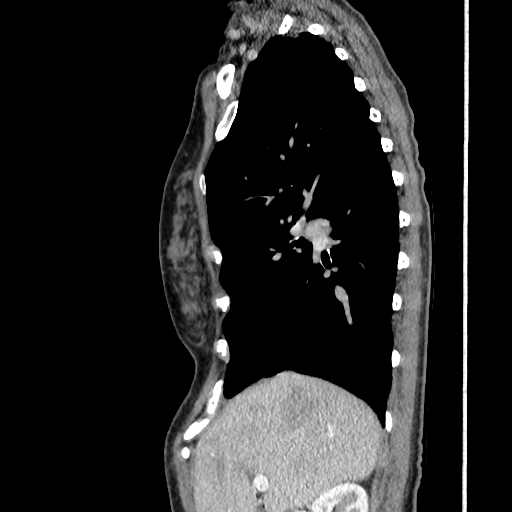
[im 58/123  mediastinal]
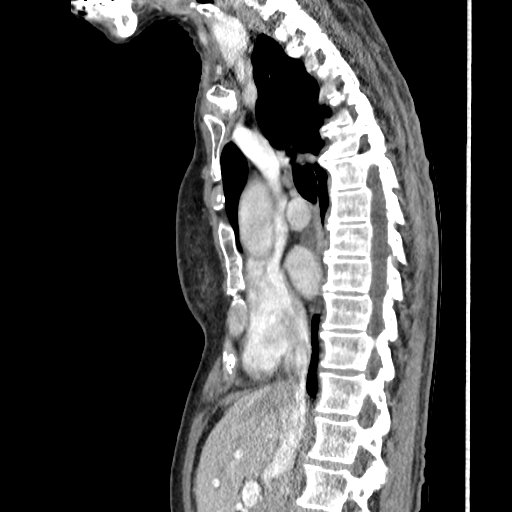
[im 72/123  mediastinal]
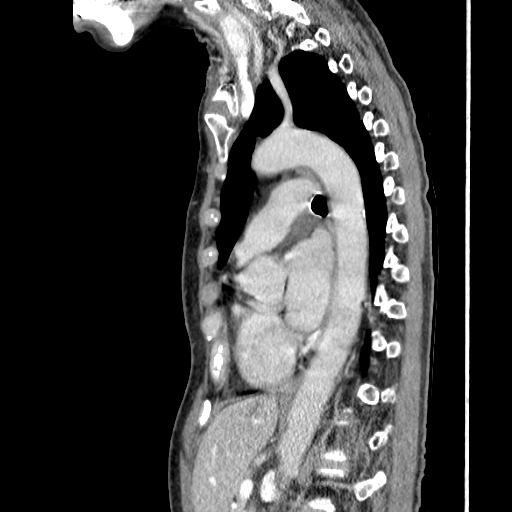
[im 87/123  mediastinal]
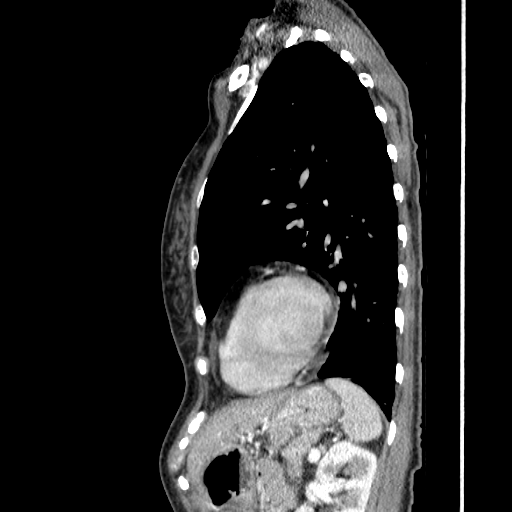
[im 101/123  mediastinal]
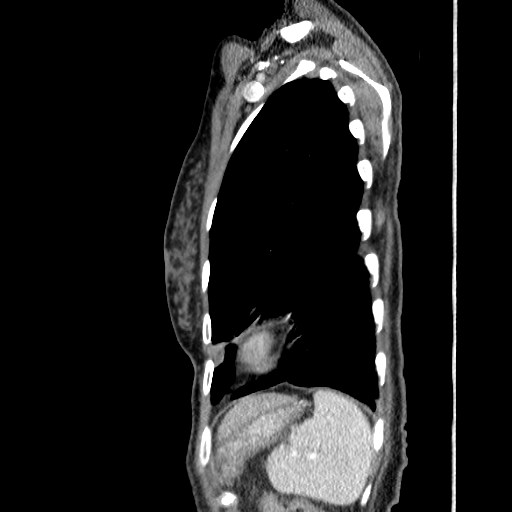
[im 115/123  mediastinal]
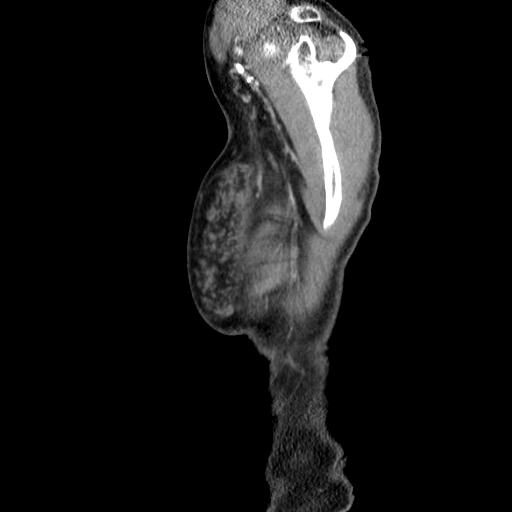

[16 of 30 positions shown; findings below may reference images not displayed]

FINDINGS: No pathologically enlarged mediastinal, hilar or axillary lymph
nodes. Heart size within normal limits. No pericardial effusion.

Mild biapical pleural parenchymal scarring. There is mild scattered
peribronchovascular nodularity, mucoid impaction and bronchiectasis
predominating in the right upper and right middle lobes. Scarring
and volume loss in the lingula and left lower lobe. An 8 mm
subpleural ground-glass nodule seen on 03/13/2014, is not readily
appreciated. No pleural fluid. Airway is unremarkable.

Incidental imaging of the upper abdomen shows the visualized
portions of the liver and adrenal glands to be grossly unremarkable.
6 mm low-attenuation lesion in the upper pole right kidney is likely
stable from 08/15/2013, as is a 5 mm low-attenuation lesion in the
interpolar left kidney. Visualized portions of the spleen, pancreas,
stomach and bowel are grossly unremarkable. No upper abdominal
adenopathy.

No worrisome lytic or sclerotic lesions.  Pectus deformity.
IMPRESSION: 1. Scattered peribronchovascular nodularity, mucoid impaction and
mild bronchiectasis, predominating in the right upper and right
middle lobes. Findings favor a chronic infectious process such as
mycobacterium avium complex ([facility]).
2. Right middle lobe ground-glass subpleural nodule seen on
03/13/2014 is not well appreciated.

## 2016-08-28 ENCOUNTER — Encounter (HOSPITAL_COMMUNITY)
Admission: RE | Admit: 2016-08-28 | Discharge: 2016-08-28 | Disposition: A | Discharge status: Home / Self Care | Payer: Self-pay | Source: Ambulatory Visit | Attending: Cardiovascular Disease | Admitting: Cardiovascular Disease

## 2016-08-29 ENCOUNTER — Encounter (HOSPITAL_COMMUNITY)
Admission: RE | Admit: 2016-08-29 | Discharge: 2016-08-29 | Disposition: A | Discharge status: Home / Self Care | Payer: Self-pay | Source: Ambulatory Visit | Attending: Cardiovascular Disease | Admitting: Cardiovascular Disease

## 2016-09-02 ENCOUNTER — Encounter (HOSPITAL_COMMUNITY)
Admission: RE | Admit: 2016-09-02 | Discharge: 2016-09-02 | Disposition: A | Discharge status: Home / Self Care | Payer: Self-pay | Source: Ambulatory Visit | Attending: Cardiovascular Disease | Admitting: Cardiovascular Disease

## 2016-09-04 ENCOUNTER — Encounter (HOSPITAL_COMMUNITY)
Admission: RE | Admit: 2016-09-04 | Discharge: 2016-09-04 | Disposition: A | Discharge status: Home / Self Care | Payer: Self-pay | Source: Ambulatory Visit | Attending: Cardiovascular Disease | Admitting: Cardiovascular Disease

## 2016-09-05 ENCOUNTER — Encounter (HOSPITAL_COMMUNITY)
Admission: RE | Admit: 2016-09-05 | Discharge: 2016-09-05 | Disposition: A | Discharge status: Home / Self Care | Payer: Self-pay | Source: Ambulatory Visit | Attending: Cardiovascular Disease | Admitting: Cardiovascular Disease

## 2016-09-09 ENCOUNTER — Encounter (HOSPITAL_COMMUNITY): Payer: Self-pay

## 2016-09-11 ENCOUNTER — Encounter (HOSPITAL_COMMUNITY)
Admission: RE | Admit: 2016-09-11 | Discharge: 2016-09-11 | Disposition: A | Discharge status: Home / Self Care | Payer: Self-pay | Source: Ambulatory Visit | Attending: Cardiovascular Disease | Admitting: Cardiovascular Disease

## 2016-09-12 ENCOUNTER — Encounter (HOSPITAL_COMMUNITY)
Admission: RE | Admit: 2016-09-12 | Discharge: 2016-09-12 | Disposition: A | Discharge status: Home / Self Care | Payer: Self-pay | Source: Ambulatory Visit | Attending: Cardiovascular Disease | Admitting: Cardiovascular Disease

## 2016-09-16 ENCOUNTER — Encounter (HOSPITAL_COMMUNITY)
Admission: RE | Admit: 2016-09-16 | Discharge: 2016-09-16 | Disposition: A | Discharge status: Home / Self Care | Payer: Self-pay | Source: Ambulatory Visit | Attending: Cardiovascular Disease | Admitting: Cardiovascular Disease

## 2016-09-18 ENCOUNTER — Encounter (HOSPITAL_COMMUNITY)
Admission: RE | Admit: 2016-09-18 | Discharge: 2016-09-18 | Disposition: A | Discharge status: Home / Self Care | Payer: Self-pay | Source: Ambulatory Visit | Attending: Cardiovascular Disease | Admitting: Cardiovascular Disease

## 2016-09-18 DIAGNOSIS — I214 Non-ST elevation (NSTEMI) myocardial infarction: Secondary | ICD-10-CM | POA: Insufficient documentation

## 2016-09-18 DIAGNOSIS — I5181 Takotsubo syndrome: Secondary | ICD-10-CM | POA: Insufficient documentation

## 2016-09-19 ENCOUNTER — Encounter (HOSPITAL_COMMUNITY)
Admission: RE | Admit: 2016-09-19 | Discharge: 2016-09-19 | Disposition: A | Discharge status: Home / Self Care | Payer: Self-pay | Source: Ambulatory Visit | Attending: Cardiovascular Disease | Admitting: Cardiovascular Disease

## 2016-09-23 ENCOUNTER — Other Ambulatory Visit: Payer: Self-pay | Admitting: Obstetrics and Gynecology

## 2016-09-23 ENCOUNTER — Encounter (HOSPITAL_COMMUNITY)
Admission: RE | Admit: 2016-09-23 | Discharge: 2016-09-23 | Disposition: A | Discharge status: Home / Self Care | Payer: Self-pay | Source: Ambulatory Visit | Attending: Cardiovascular Disease | Admitting: Cardiovascular Disease

## 2016-09-23 DIAGNOSIS — Z1231 Encounter for screening mammogram for malignant neoplasm of breast: Secondary | ICD-10-CM

## 2016-09-25 ENCOUNTER — Encounter (HOSPITAL_COMMUNITY)
Admission: RE | Admit: 2016-09-25 | Discharge: 2016-09-25 | Disposition: A | Discharge status: Home / Self Care | Payer: Self-pay | Source: Ambulatory Visit | Attending: Cardiovascular Disease | Admitting: Cardiovascular Disease

## 2016-09-26 ENCOUNTER — Encounter (HOSPITAL_COMMUNITY)
Admission: RE | Admit: 2016-09-26 | Discharge: 2016-09-26 | Disposition: A | Discharge status: Home / Self Care | Payer: Self-pay | Source: Ambulatory Visit | Attending: Cardiovascular Disease | Admitting: Cardiovascular Disease

## 2016-09-30 ENCOUNTER — Encounter (HOSPITAL_COMMUNITY): Payer: Self-pay

## 2016-10-02 ENCOUNTER — Encounter (HOSPITAL_COMMUNITY)
Admission: RE | Admit: 2016-10-02 | Discharge: 2016-10-02 | Disposition: A | Discharge status: Home / Self Care | Payer: Self-pay | Source: Ambulatory Visit | Attending: Cardiovascular Disease | Admitting: Cardiovascular Disease

## 2016-10-03 ENCOUNTER — Encounter (HOSPITAL_COMMUNITY)
Admission: RE | Admit: 2016-10-03 | Discharge: 2016-10-03 | Disposition: A | Discharge status: Home / Self Care | Payer: Self-pay | Source: Ambulatory Visit | Attending: Cardiovascular Disease | Admitting: Cardiovascular Disease

## 2016-10-04 ENCOUNTER — Other Ambulatory Visit: Payer: Self-pay | Admitting: Cardiovascular Disease

## 2016-10-07 ENCOUNTER — Encounter (HOSPITAL_COMMUNITY)
Admission: RE | Admit: 2016-10-07 | Discharge: 2016-10-07 | Disposition: A | Discharge status: Home / Self Care | Payer: Self-pay | Source: Ambulatory Visit | Attending: Cardiovascular Disease | Admitting: Cardiovascular Disease

## 2016-10-09 ENCOUNTER — Encounter (HOSPITAL_COMMUNITY)
Admission: RE | Admit: 2016-10-09 | Discharge: 2016-10-09 | Disposition: A | Discharge status: Home / Self Care | Payer: Self-pay | Source: Ambulatory Visit | Attending: Cardiovascular Disease | Admitting: Cardiovascular Disease

## 2016-10-10 ENCOUNTER — Encounter (HOSPITAL_COMMUNITY)
Admission: RE | Admit: 2016-10-10 | Discharge: 2016-10-10 | Disposition: A | Discharge status: Home / Self Care | Payer: Self-pay | Source: Ambulatory Visit | Attending: Cardiovascular Disease | Admitting: Cardiovascular Disease

## 2016-10-14 ENCOUNTER — Encounter (HOSPITAL_COMMUNITY): Admission: RE | Admit: 2016-10-14 | Payer: Self-pay | Source: Ambulatory Visit

## 2016-10-14 DIAGNOSIS — H52223 Regular astigmatism, bilateral: Secondary | ICD-10-CM | POA: Diagnosis not present

## 2016-10-14 DIAGNOSIS — K5010 Crohn's disease of large intestine without complications: Secondary | ICD-10-CM | POA: Diagnosis not present

## 2016-10-14 DIAGNOSIS — H43399 Other vitreous opacities, unspecified eye: Secondary | ICD-10-CM | POA: Diagnosis not present

## 2016-10-14 DIAGNOSIS — H25813 Combined forms of age-related cataract, bilateral: Secondary | ICD-10-CM | POA: Diagnosis not present

## 2016-10-14 DIAGNOSIS — H5203 Hypermetropia, bilateral: Secondary | ICD-10-CM | POA: Diagnosis not present

## 2016-10-16 ENCOUNTER — Encounter: Payer: Self-pay | Admitting: Cardiovascular Disease

## 2016-10-16 ENCOUNTER — Encounter (HOSPITAL_COMMUNITY)
Admission: RE | Admit: 2016-10-16 | Discharge: 2016-10-16 | Disposition: A | Discharge status: Home / Self Care | Payer: Self-pay | Source: Ambulatory Visit | Attending: Cardiovascular Disease | Admitting: Cardiovascular Disease

## 2016-10-17 ENCOUNTER — Encounter (HOSPITAL_COMMUNITY)
Admission: RE | Admit: 2016-10-17 | Discharge: 2016-10-17 | Disposition: A | Discharge status: Home / Self Care | Payer: Self-pay | Source: Ambulatory Visit | Attending: Cardiovascular Disease | Admitting: Cardiovascular Disease

## 2016-10-21 ENCOUNTER — Encounter (HOSPITAL_COMMUNITY)
Admission: RE | Admit: 2016-10-21 | Discharge: 2016-10-21 | Disposition: A | Discharge status: Home / Self Care | Payer: Self-pay | Source: Ambulatory Visit | Attending: Cardiovascular Disease | Admitting: Cardiovascular Disease

## 2016-10-21 DIAGNOSIS — I252 Old myocardial infarction: Secondary | ICD-10-CM | POA: Insufficient documentation

## 2016-10-21 DIAGNOSIS — I214 Non-ST elevation (NSTEMI) myocardial infarction: Secondary | ICD-10-CM | POA: Insufficient documentation

## 2016-10-23 ENCOUNTER — Encounter (HOSPITAL_COMMUNITY)
Admission: RE | Admit: 2016-10-23 | Discharge: 2016-10-23 | Disposition: A | Discharge status: Home / Self Care | Payer: Self-pay | Source: Ambulatory Visit | Attending: Cardiovascular Disease | Admitting: Cardiovascular Disease

## 2016-10-24 ENCOUNTER — Encounter (HOSPITAL_COMMUNITY)
Admission: RE | Admit: 2016-10-24 | Discharge: 2016-10-24 | Disposition: A | Discharge status: Home / Self Care | Payer: Self-pay | Source: Ambulatory Visit | Attending: Cardiovascular Disease | Admitting: Cardiovascular Disease

## 2016-10-28 ENCOUNTER — Encounter (HOSPITAL_COMMUNITY)
Admission: RE | Admit: 2016-10-28 | Discharge: 2016-10-28 | Disposition: A | Discharge status: Home / Self Care | Payer: Self-pay | Source: Ambulatory Visit | Attending: Cardiovascular Disease | Admitting: Cardiovascular Disease

## 2016-10-30 ENCOUNTER — Encounter: Payer: Self-pay | Admitting: Cardiovascular Disease

## 2016-10-30 ENCOUNTER — Other Ambulatory Visit: Payer: Self-pay | Admitting: Cardiovascular Disease

## 2016-10-30 ENCOUNTER — Encounter (HOSPITAL_COMMUNITY)
Admission: RE | Admit: 2016-10-30 | Discharge: 2016-10-30 | Disposition: A | Discharge status: Home / Self Care | Payer: Self-pay | Source: Ambulatory Visit | Attending: Cardiovascular Disease | Admitting: Cardiovascular Disease

## 2016-10-30 ENCOUNTER — Ambulatory Visit (INDEPENDENT_AMBULATORY_CARE_PROVIDER_SITE_OTHER): Payer: Medicare Other | Admitting: Cardiovascular Disease

## 2016-10-30 VITALS — BP 122/80 | HR 56 | Ht 67.5 in | Wt 117.0 lb

## 2016-10-30 DIAGNOSIS — I5181 Takotsubo syndrome: Secondary | ICD-10-CM | POA: Diagnosis not present

## 2016-10-30 DIAGNOSIS — I10 Essential (primary) hypertension: Secondary | ICD-10-CM | POA: Diagnosis not present

## 2016-10-30 MED ORDER — FUROSEMIDE 20 MG PO TABS: 20.0000 mg | ORAL_TABLET | Freq: Every day | ORAL | 3 refills | Status: DC

## 2016-10-30 MED ORDER — CARVEDILOL 3.125 MG PO TABS: ORAL_TABLET | ORAL | 3 refills | Status: DC

## 2016-10-30 MED ORDER — BENAZEPRIL HCL 5 MG PO TABS: 5.0000 mg | ORAL_TABLET | Freq: Every day | ORAL | 3 refills | Status: DC

## 2016-10-30 MED ORDER — POTASSIUM CHLORIDE CRYS ER 20 MEQ PO TBCR: 20.0000 meq | EXTENDED_RELEASE_TABLET | Freq: Every day | ORAL | 3 refills | Status: DC

## 2016-10-30 MED ORDER — NITROGLYCERIN 0.4 MG SL SUBL: 0.4000 mg | SUBLINGUAL_TABLET | SUBLINGUAL | 2 refills | Status: DC | PRN

## 2016-10-30 NOTE — Patient Instructions (Signed)
Medication Instructions:  Your physician has recommended you make the following change in your medication:  1. START Benazepril 5mg  take one tablet by mouth daily  Labwork: No new orders.   Testing/Procedures: No new orders.   Follow-Up: Your physician wants you to follow-up in: 1 YEAR with Dr Burt Knack.  You will receive a reminder letter in the mail two months in advance. If you don't receive a letter, please call our office to schedule the follow-up appointment.   Any Other Special Instructions Will Be Listed Below (If Applicable).     If you need a refill on your cardiac medications before your next appointment, please call your pharmacy.

## 2016-10-30 NOTE — Progress Notes (Signed)
Cardiology Office Note Date:  10/30/2016   ID:  Kirsten Rice, DOB [birth year], MRN [id number]  PCP:  Sheela Stack, MD  Cardiologist:  Sherren Mocha, MD    No chief complaint on file.    History of Present Illness: Kirsten Rice is a 71 y.o. female who presents for FU of Takotsubo Syndrome.   She initially presented with Takotsubo's cardiomyopathy in 2007 and did well until 2016 when she had a recurrence. She had normal coronaries at cath in 2016, LVEF was reduced at 25-35% in typical Takotsubo's pattern. Follow-up echo showed normalization of LV function with an LVEF of 60%. She presents today for follow-up, last seen March 2017.   Here alone today. It's been a difficult year, caring for her husband who has developed progressive dementia. She continues with maintenance cardiac rehab. Today, she denies symptoms of palpitations, chest pain, shortness of breath, orthopnea, PND, lower extremity edema, dizziness, or syncope.    Past Medical History:  Diagnosis Date  . Arthritis    "probably a little bit in my knees and hips" (01/31/2015)  . Basal cell carcinoma   . Chronic systolic CHF (congestive heart failure) ([place])    a. 01/2015 Echo: EF 25-35% in setting of takotsubo cardiomyopathy.  . Crohn's disease ([place])   . Deep vein thrombosis ([place])    pt denies: 04/08/13  . Essential hypertension   . History of pneumonia    "2-3 times" (01/31/2015)  . Hyperthyroidism    pt denies:  04/08/13  . Mitral valve prolapse    a. 01/2015 Echo: Ef 25-35%, triv MR.  . Myocardial infarct   . Ocular headache    "weekly" (01/31/2015)  . Persistent dry cough   . PONV (postoperative nausea and vomiting) 1970's   "when I had my appendix out"  . Raynaud's disease   . Squamous carcinoma   . Takotsubo cardiomyopathy    a. 2007 Cath: nl cors, apical ballooning w/ subsequent recovery of LV fxn;  b. 01/2015 NSTEMI/Cath: nl cors, EF 25-25% w/ apical ballooning;  c. 01/2015 echo: EF 30%, septal, apical, mid  anterior, inf HK, mildly dil LA/RA.  Marland Kitchen Telangiectasia     Past Surgical History:  Procedure Laterality Date  . APPENDECTOMY  1971  . BASAL CELL CARCINOMA EXCISION     forehead, back, legs, arms" (01/31/2015)  . CARDIAC CATHETERIZATION  2006; 2007  . CARDIAC CATHETERIZATION N/A 01/31/2015   Procedure: Left Heart Cath and Coronary Angiography;  Surgeon: Jettie Booze, MD;  Location: [facility];  Service: Cardiovascular;  Laterality: N/A;  . COLONOSCOPY    . COLONOSCOPY  05/11/2012   Procedure: COLONOSCOPY;  Surgeon: Lafayette Dragon, MD;  Location: [facility];  Service: Endoscopy;  Laterality: N/A;  . SHOULDER ARTHROSCOPY W/ ROTATOR CUFF REPAIR Right 1996  . SQUAMOUS CELL CARCINOMA EXCISION Left early 1980's   "eyelid"  . TUBAL LIGATION  1973    Current Outpatient Prescriptions  Medication Sig Dispense Refill  . acetaminophen (TYLENOL) 325 MG tablet Take 650 mg by mouth every 6 (six) hours as needed for mild pain or headache.    Marland Kitchen aspirin 81 MG tablet Take 81 mg by mouth daily.      . balsalazide (COLAZAL) 750 MG capsule Take 2,250 mg by mouth 3 (three) times daily.     . bifidobacterium infantis (ALIGN) capsule Take 1 capsule by mouth daily.    . Biotin 2500 MCG CAPS Take 2,500 mcg by mouth daily.     . Calcium-Vitamin  D 600-200 MG-UNIT per tablet Take 1 tablet by mouth daily.     . carvedilol (COREG) 3.125 MG tablet TAKE 1 TABLET BY MOUTH TWICE DAILY WITH A MEAL 180 tablet 3  . clobetasol cream (TEMOVATE) 6.23 % Apply 1 application topically at bedtime as needed (dry skin on hands).   0  . fluticasone (FLONASE) 50 MCG/ACT nasal spray USE 2 SPRAYS IN EACH NOSTRIL DAILY AS NEEDED FOR CONGESTION. 16 g 5  . furosemide (LASIX) 20 MG tablet TAKE 1 TABLET BY MOUTH DAILY 90 tablet 1  . nitroGLYCERIN (NITROSTAT) 0.4 MG SL tablet Place 1 tablet (0.4 mg total) under the tongue every 5 (five) minutes x 3 doses as needed for chest pain. 25 tablet 2  . potassium chloride SA  (K-DUR,KLOR-CON) 20 MEQ tablet TAKE 1 TABLET BY MOUTH EVERY DAY 90 tablet 0  . PRESCRIPTION MEDICATION Apply 1 application topically daily as needed (rosasea). Finacea Cream, uses PRN    . RESTASIS 0.05 % ophthalmic emulsion Place 1 drop into both eyes 2 (two) times daily.   3  . vitamin C (ASCORBIC ACID) 500 MG tablet Take 500 mg by mouth daily.       No current facility-administered medications for this visit.     Allergies:   Cafergot [ergotamine-caffeine]; Compazine [prochlorperazine edisylate]; Ergotamine-caffeine; Ergotamine-caffeine; Prochlorperazine; Prochlorperazine edisylate; Tetanus-diphtheria toxoids td; Other; and Tetanus toxoid adsorbed   Social History:  The patient  reports that she has never smoked. She has never used smokeless tobacco. She reports that she drinks alcohol. She reports that she does not use drugs.   Family History:  The patient's  family history includes Aortic stenosis in her father; Diabetes in her sister and sister; Hypertension in her father and mother; Hyperthyroidism in her sister, sister, sister, and sister.    ROS:  Please see the history of present illness.  Otherwise, review of systems is positive for rash, headache, restless legs.  All other systems are reviewed and negative.    PHYSICAL EXAM: VS:  BP 122/80   Pulse (!) 56   Ht 5' 7.5" (1.715 m)   Wt 117 lb (53.1 kg)   SpO2 95%   BMI 18.05 kg/m  , BMI Body mass index is 18.05 kg/m. GEN: thin, pleasant woman, in no acute distress  HEENT: normal  Neck: no JVD, no masses. No carotid bruits Cardiac: RRR without murmur or gallop                Respiratory:  clear to auscultation bilaterally, normal work of breathing GI: soft, nontender, nondistended, + BS MS: no deformity or atrophy  Ext: no pretibial edema, pedal pulses 2+= bilaterally Skin: warm and dry, no rash Neuro:  Strength and sensation are intact Psych: euthymic mood, full affect  EKG:  EKG is ordered today. The ekg ordered today  shows sinus brady 54 bpm, otherwise normal  Recent Labs: 04/03/2016: BUN 15; Creatinine, Ser 0.73; Hemoglobin 14.4; Platelets 263; Potassium 3.5; Sodium 139   Lipid Panel     Component Value Date/Time   CHOL 158 02/01/2015 0332   TRIG 65 02/01/2015 0332   HDL 66 02/01/2015 0332   CHOLHDL 2.4 02/01/2015 0332   VLDL 13 02/01/2015 0332   LDLCALC 79 02/01/2015 0332      Wt Readings from Last 3 Encounters:  10/30/16 117 lb (53.1 kg)  04/03/16 121 lb (54.9 kg)  09/19/15 120 lb 1.9 oz (54.5 kg)    ASSESSMENT AND PLAN: 1.  Takotsubo cardiomyopathy: remains stable  with NYHA I sx's. LVEF normal after acute recurrence in 2016. Continue carvedilol. Resume benazepril at low-dose. Continues to have a lot of stress.  2. HTN: BP control reasonably good, but she wishes to resume benazepril at low-dose. Will start back on 5 mg daily.  Current medicines are reviewed with the patient today.  The patient does not have concerns regarding medicines.  Labs/ tests ordered today include:  No orders of the defined types were placed in this encounter.   Disposition:   FU one year  Signed, Sherren Mocha, MD  10/30/2016 11:59 AM    [facility] [place], [place], [place]  [address] Phone: [phone]; Fax: [phone]

## 2016-10-31 ENCOUNTER — Encounter (HOSPITAL_COMMUNITY)
Admission: RE | Admit: 2016-10-31 | Discharge: 2016-10-31 | Disposition: A | Discharge status: Home / Self Care | Payer: Self-pay | Source: Ambulatory Visit | Attending: Cardiovascular Disease | Admitting: Cardiovascular Disease

## 2016-11-04 ENCOUNTER — Encounter (HOSPITAL_COMMUNITY)
Admission: RE | Admit: 2016-11-04 | Discharge: 2016-11-04 | Disposition: A | Discharge status: Home / Self Care | Payer: Self-pay | Source: Ambulatory Visit | Attending: Cardiovascular Disease | Admitting: Cardiovascular Disease

## 2016-11-05 ENCOUNTER — Ambulatory Visit
Admission: RE | Admit: 2016-11-05 | Discharge: 2016-11-05 | Disposition: A | Discharge status: Home / Self Care | Payer: Medicare Other | Source: Ambulatory Visit | Attending: Obstetrics and Gynecology | Admitting: Obstetrics and Gynecology

## 2016-11-05 DIAGNOSIS — Z1231 Encounter for screening mammogram for malignant neoplasm of breast: Secondary | ICD-10-CM

## 2016-11-06 ENCOUNTER — Encounter (HOSPITAL_COMMUNITY)
Admission: RE | Admit: 2016-11-06 | Discharge: 2016-11-06 | Disposition: A | Discharge status: Home / Self Care | Payer: Self-pay | Source: Ambulatory Visit | Attending: Cardiovascular Disease | Admitting: Cardiovascular Disease

## 2016-11-07 ENCOUNTER — Encounter (HOSPITAL_COMMUNITY)
Admission: RE | Admit: 2016-11-07 | Discharge: 2016-11-07 | Disposition: A | Discharge status: Home / Self Care | Payer: Self-pay | Source: Ambulatory Visit | Attending: Cardiovascular Disease | Admitting: Cardiovascular Disease

## 2016-11-08 ENCOUNTER — Other Ambulatory Visit: Payer: Self-pay | Admitting: Cardiovascular Disease

## 2016-11-11 ENCOUNTER — Encounter (HOSPITAL_COMMUNITY)
Admission: RE | Admit: 2016-11-11 | Discharge: 2016-11-11 | Disposition: A | Discharge status: Home / Self Care | Payer: Self-pay | Source: Ambulatory Visit | Attending: Cardiovascular Disease | Admitting: Cardiovascular Disease

## 2016-11-13 ENCOUNTER — Encounter (HOSPITAL_COMMUNITY)
Admission: RE | Admit: 2016-11-13 | Discharge: 2016-11-13 | Disposition: A | Discharge status: Home / Self Care | Payer: Self-pay | Source: Ambulatory Visit | Attending: Cardiovascular Disease | Admitting: Cardiovascular Disease

## 2016-11-14 ENCOUNTER — Encounter (HOSPITAL_COMMUNITY): Payer: Self-pay

## 2016-11-18 ENCOUNTER — Encounter (HOSPITAL_COMMUNITY)
Admission: RE | Admit: 2016-11-18 | Discharge: 2016-11-18 | Disposition: A | Discharge status: Home / Self Care | Payer: Self-pay | Source: Ambulatory Visit | Attending: Cardiovascular Disease | Admitting: Cardiovascular Disease

## 2016-11-18 DIAGNOSIS — I252 Old myocardial infarction: Secondary | ICD-10-CM | POA: Insufficient documentation

## 2016-11-18 DIAGNOSIS — I214 Non-ST elevation (NSTEMI) myocardial infarction: Secondary | ICD-10-CM | POA: Insufficient documentation

## 2016-11-20 ENCOUNTER — Encounter (HOSPITAL_COMMUNITY)
Admission: RE | Admit: 2016-11-20 | Discharge: 2016-11-20 | Disposition: A | Discharge status: Home / Self Care | Payer: Self-pay | Source: Ambulatory Visit | Attending: Cardiovascular Disease | Admitting: Cardiovascular Disease

## 2016-11-21 ENCOUNTER — Encounter (HOSPITAL_COMMUNITY)
Admission: RE | Admit: 2016-11-21 | Discharge: 2016-11-21 | Disposition: A | Discharge status: Home / Self Care | Payer: Self-pay | Source: Ambulatory Visit | Attending: Cardiovascular Disease | Admitting: Cardiovascular Disease

## 2016-11-25 ENCOUNTER — Encounter (HOSPITAL_COMMUNITY): Payer: Self-pay

## 2016-11-27 ENCOUNTER — Encounter (HOSPITAL_COMMUNITY)
Admission: RE | Admit: 2016-11-27 | Discharge: 2016-11-27 | Disposition: A | Discharge status: Home / Self Care | Payer: Self-pay | Source: Ambulatory Visit | Attending: Cardiovascular Disease | Admitting: Cardiovascular Disease

## 2016-11-28 ENCOUNTER — Encounter (HOSPITAL_COMMUNITY)
Admission: RE | Admit: 2016-11-28 | Discharge: 2016-11-28 | Disposition: A | Discharge status: Home / Self Care | Payer: Self-pay | Source: Ambulatory Visit | Attending: Cardiovascular Disease | Admitting: Cardiovascular Disease

## 2016-12-02 ENCOUNTER — Encounter (HOSPITAL_COMMUNITY)
Admission: RE | Admit: 2016-12-02 | Discharge: 2016-12-02 | Disposition: A | Discharge status: Home / Self Care | Payer: Self-pay | Source: Ambulatory Visit | Attending: Cardiovascular Disease | Admitting: Cardiovascular Disease

## 2016-12-02 DIAGNOSIS — Z124 Encounter for screening for malignant neoplasm of cervix: Secondary | ICD-10-CM | POA: Diagnosis not present

## 2016-12-02 DIAGNOSIS — Z01419 Encounter for gynecological examination (general) (routine) without abnormal findings: Secondary | ICD-10-CM | POA: Diagnosis not present

## 2016-12-02 DIAGNOSIS — Z681 Body mass index (BMI) 19 or less, adult: Secondary | ICD-10-CM | POA: Diagnosis not present

## 2016-12-04 ENCOUNTER — Encounter (HOSPITAL_COMMUNITY)
Admission: RE | Admit: 2016-12-04 | Discharge: 2016-12-04 | Disposition: A | Discharge status: Home / Self Care | Payer: Self-pay | Source: Ambulatory Visit | Attending: Cardiovascular Disease | Admitting: Cardiovascular Disease

## 2016-12-05 ENCOUNTER — Encounter (HOSPITAL_COMMUNITY)
Admission: RE | Admit: 2016-12-05 | Discharge: 2016-12-05 | Disposition: A | Discharge status: Home / Self Care | Payer: Self-pay | Source: Ambulatory Visit | Attending: Cardiovascular Disease | Admitting: Cardiovascular Disease

## 2016-12-08 DIAGNOSIS — N281 Cyst of kidney, acquired: Secondary | ICD-10-CM | POA: Diagnosis not present

## 2016-12-09 ENCOUNTER — Encounter (HOSPITAL_COMMUNITY)
Admission: RE | Admit: 2016-12-09 | Discharge: 2016-12-09 | Disposition: A | Discharge status: Home / Self Care | Payer: Self-pay | Source: Ambulatory Visit | Attending: Cardiovascular Disease | Admitting: Cardiovascular Disease

## 2016-12-10 DIAGNOSIS — I10 Essential (primary) hypertension: Secondary | ICD-10-CM | POA: Diagnosis not present

## 2016-12-10 DIAGNOSIS — I214 Non-ST elevation (NSTEMI) myocardial infarction: Secondary | ICD-10-CM | POA: Diagnosis not present

## 2016-12-10 DIAGNOSIS — K5190 Ulcerative colitis, unspecified, without complications: Secondary | ICD-10-CM | POA: Diagnosis not present

## 2016-12-10 DIAGNOSIS — E784 Other hyperlipidemia: Secondary | ICD-10-CM | POA: Diagnosis not present

## 2016-12-11 ENCOUNTER — Encounter (HOSPITAL_COMMUNITY)
Admission: RE | Admit: 2016-12-11 | Discharge: 2016-12-11 | Disposition: A | Discharge status: Home / Self Care | Payer: Self-pay | Source: Ambulatory Visit | Attending: Cardiovascular Disease | Admitting: Cardiovascular Disease

## 2016-12-12 ENCOUNTER — Encounter (HOSPITAL_COMMUNITY)
Admission: RE | Admit: 2016-12-12 | Discharge: 2016-12-12 | Disposition: A | Discharge status: Home / Self Care | Payer: Self-pay | Source: Ambulatory Visit | Attending: Cardiovascular Disease | Admitting: Cardiovascular Disease

## 2016-12-12 DIAGNOSIS — L817 Pigmented purpuric dermatosis: Secondary | ICD-10-CM | POA: Diagnosis not present

## 2016-12-12 DIAGNOSIS — D2271 Melanocytic nevi of right lower limb, including hip: Secondary | ICD-10-CM | POA: Diagnosis not present

## 2016-12-12 DIAGNOSIS — L438 Other lichen planus: Secondary | ICD-10-CM | POA: Diagnosis not present

## 2016-12-12 DIAGNOSIS — D2272 Melanocytic nevi of left lower limb, including hip: Secondary | ICD-10-CM | POA: Diagnosis not present

## 2016-12-16 ENCOUNTER — Encounter (HOSPITAL_COMMUNITY)
Admission: RE | Admit: 2016-12-16 | Discharge: 2016-12-16 | Disposition: A | Discharge status: Home / Self Care | Payer: Self-pay | Source: Ambulatory Visit | Attending: Cardiovascular Disease | Admitting: Cardiovascular Disease

## 2016-12-18 ENCOUNTER — Encounter (HOSPITAL_COMMUNITY): Payer: Self-pay

## 2016-12-19 ENCOUNTER — Encounter (HOSPITAL_COMMUNITY)
Admission: RE | Admit: 2016-12-19 | Discharge: 2016-12-19 | Disposition: A | Discharge status: Home / Self Care | Payer: Self-pay | Source: Ambulatory Visit | Attending: Cardiovascular Disease | Admitting: Cardiovascular Disease

## 2016-12-19 DIAGNOSIS — I214 Non-ST elevation (NSTEMI) myocardial infarction: Secondary | ICD-10-CM | POA: Insufficient documentation

## 2016-12-19 DIAGNOSIS — I252 Old myocardial infarction: Secondary | ICD-10-CM | POA: Insufficient documentation

## 2016-12-23 ENCOUNTER — Encounter (HOSPITAL_COMMUNITY)
Admission: RE | Admit: 2016-12-23 | Discharge: 2016-12-23 | Disposition: A | Discharge status: Home / Self Care | Payer: Self-pay | Source: Ambulatory Visit | Attending: Cardiovascular Disease | Admitting: Cardiovascular Disease

## 2016-12-25 ENCOUNTER — Encounter (HOSPITAL_COMMUNITY)
Admission: RE | Admit: 2016-12-25 | Discharge: 2016-12-25 | Disposition: A | Discharge status: Home / Self Care | Payer: Self-pay | Source: Ambulatory Visit | Attending: Cardiovascular Disease | Admitting: Cardiovascular Disease

## 2016-12-26 ENCOUNTER — Encounter (HOSPITAL_COMMUNITY)
Admission: RE | Admit: 2016-12-26 | Discharge: 2016-12-26 | Disposition: A | Discharge status: Home / Self Care | Payer: Self-pay | Source: Ambulatory Visit | Attending: Cardiovascular Disease | Admitting: Cardiovascular Disease

## 2016-12-30 ENCOUNTER — Encounter (HOSPITAL_COMMUNITY)
Admission: RE | Admit: 2016-12-30 | Discharge: 2016-12-30 | Disposition: A | Discharge status: Home / Self Care | Payer: Self-pay | Source: Ambulatory Visit | Attending: Cardiovascular Disease | Admitting: Cardiovascular Disease

## 2017-01-01 ENCOUNTER — Encounter (HOSPITAL_COMMUNITY)
Admission: RE | Admit: 2017-01-01 | Discharge: 2017-01-01 | Disposition: A | Discharge status: Home / Self Care | Payer: Self-pay | Source: Ambulatory Visit | Attending: Cardiovascular Disease | Admitting: Cardiovascular Disease

## 2017-01-02 ENCOUNTER — Encounter (HOSPITAL_COMMUNITY): Payer: Self-pay

## 2017-01-06 ENCOUNTER — Encounter (HOSPITAL_COMMUNITY): Payer: Self-pay

## 2017-01-06 ENCOUNTER — Other Ambulatory Visit: Payer: Self-pay | Admitting: Cardiovascular Disease

## 2017-01-08 ENCOUNTER — Encounter (HOSPITAL_COMMUNITY)
Admission: RE | Admit: 2017-01-08 | Discharge: 2017-01-08 | Disposition: A | Discharge status: Home / Self Care | Payer: Self-pay | Source: Ambulatory Visit | Attending: Cardiovascular Disease | Admitting: Cardiovascular Disease

## 2017-01-09 ENCOUNTER — Encounter (HOSPITAL_COMMUNITY)
Admission: RE | Admit: 2017-01-09 | Discharge: 2017-01-09 | Disposition: A | Discharge status: Home / Self Care | Payer: Self-pay | Source: Ambulatory Visit | Attending: Cardiovascular Disease | Admitting: Cardiovascular Disease

## 2017-01-13 ENCOUNTER — Encounter (HOSPITAL_COMMUNITY)
Admission: RE | Admit: 2017-01-13 | Discharge: 2017-01-13 | Disposition: A | Discharge status: Home / Self Care | Payer: Self-pay | Source: Ambulatory Visit | Attending: Cardiovascular Disease | Admitting: Cardiovascular Disease

## 2017-01-15 ENCOUNTER — Encounter (HOSPITAL_COMMUNITY)
Admission: RE | Admit: 2017-01-15 | Discharge: 2017-01-15 | Disposition: A | Discharge status: Home / Self Care | Payer: Self-pay | Source: Ambulatory Visit | Attending: Cardiovascular Disease | Admitting: Cardiovascular Disease

## 2017-01-16 ENCOUNTER — Encounter (HOSPITAL_COMMUNITY)
Admission: RE | Admit: 2017-01-16 | Discharge: 2017-01-16 | Disposition: A | Discharge status: Home / Self Care | Payer: Self-pay | Source: Ambulatory Visit | Attending: Cardiovascular Disease | Admitting: Cardiovascular Disease

## 2017-01-20 ENCOUNTER — Encounter (HOSPITAL_COMMUNITY)
Admission: RE | Admit: 2017-01-20 | Discharge: 2017-01-20 | Disposition: A | Discharge status: Home / Self Care | Payer: Medicare Other | Source: Ambulatory Visit | Attending: Cardiovascular Disease | Admitting: Cardiovascular Disease

## 2017-01-20 DIAGNOSIS — I214 Non-ST elevation (NSTEMI) myocardial infarction: Secondary | ICD-10-CM | POA: Insufficient documentation

## 2017-01-20 DIAGNOSIS — I252 Old myocardial infarction: Secondary | ICD-10-CM | POA: Insufficient documentation

## 2017-01-22 ENCOUNTER — Encounter (HOSPITAL_COMMUNITY)
Admission: RE | Admit: 2017-01-22 | Discharge: 2017-01-22 | Disposition: A | Discharge status: Home / Self Care | Payer: Medicare Other | Source: Ambulatory Visit | Attending: Cardiovascular Disease | Admitting: Cardiovascular Disease

## 2017-01-23 ENCOUNTER — Encounter (HOSPITAL_COMMUNITY)
Admission: RE | Admit: 2017-01-23 | Discharge: 2017-01-23 | Disposition: A | Discharge status: Home / Self Care | Payer: Medicare Other | Source: Ambulatory Visit | Attending: Cardiovascular Disease | Admitting: Cardiovascular Disease

## 2017-01-27 ENCOUNTER — Encounter (HOSPITAL_COMMUNITY)
Admission: RE | Admit: 2017-01-27 | Discharge: 2017-01-27 | Disposition: A | Discharge status: Home / Self Care | Payer: Medicare Other | Source: Ambulatory Visit | Attending: Cardiovascular Disease | Admitting: Cardiovascular Disease

## 2017-01-29 ENCOUNTER — Encounter (HOSPITAL_COMMUNITY)
Admission: RE | Admit: 2017-01-29 | Discharge: 2017-01-29 | Disposition: A | Discharge status: Home / Self Care | Payer: Medicare Other | Source: Ambulatory Visit | Attending: Cardiovascular Disease | Admitting: Cardiovascular Disease

## 2017-01-30 ENCOUNTER — Encounter (HOSPITAL_COMMUNITY): Payer: Medicare Other

## 2017-02-03 ENCOUNTER — Encounter (HOSPITAL_COMMUNITY): Payer: Medicare Other

## 2017-02-05 ENCOUNTER — Encounter (HOSPITAL_COMMUNITY)
Admission: RE | Admit: 2017-02-05 | Discharge: 2017-02-05 | Disposition: A | Discharge status: Home / Self Care | Payer: Medicare Other | Source: Ambulatory Visit | Attending: Cardiovascular Disease | Admitting: Cardiovascular Disease

## 2017-02-06 ENCOUNTER — Encounter (HOSPITAL_COMMUNITY): Payer: Medicare Other

## 2017-02-10 ENCOUNTER — Encounter (HOSPITAL_COMMUNITY)
Admission: RE | Admit: 2017-02-10 | Discharge: 2017-02-10 | Disposition: A | Discharge status: Home / Self Care | Payer: Medicare Other | Source: Ambulatory Visit | Attending: Cardiovascular Disease | Admitting: Cardiovascular Disease

## 2017-02-12 ENCOUNTER — Encounter (HOSPITAL_COMMUNITY)
Admission: RE | Admit: 2017-02-12 | Discharge: 2017-02-12 | Disposition: A | Discharge status: Home / Self Care | Payer: Medicare Other | Source: Ambulatory Visit | Attending: Cardiovascular Disease | Admitting: Cardiovascular Disease

## 2017-02-13 ENCOUNTER — Encounter (HOSPITAL_COMMUNITY)
Admission: RE | Admit: 2017-02-13 | Discharge: 2017-02-13 | Disposition: A | Discharge status: Home / Self Care | Payer: Medicare Other | Source: Ambulatory Visit | Attending: Cardiovascular Disease | Admitting: Cardiovascular Disease

## 2017-02-17 ENCOUNTER — Encounter (HOSPITAL_COMMUNITY)
Admission: RE | Admit: 2017-02-17 | Discharge: 2017-02-17 | Disposition: A | Discharge status: Home / Self Care | Payer: Medicare Other | Source: Ambulatory Visit | Attending: Cardiovascular Disease | Admitting: Cardiovascular Disease

## 2017-02-19 ENCOUNTER — Encounter (HOSPITAL_COMMUNITY)
Admission: RE | Admit: 2017-02-19 | Discharge: 2017-02-19 | Disposition: A | Discharge status: Home / Self Care | Payer: Medicare Other | Source: Ambulatory Visit | Attending: Cardiovascular Disease | Admitting: Cardiovascular Disease

## 2017-02-19 DIAGNOSIS — I252 Old myocardial infarction: Secondary | ICD-10-CM | POA: Insufficient documentation

## 2017-02-19 DIAGNOSIS — I214 Non-ST elevation (NSTEMI) myocardial infarction: Secondary | ICD-10-CM | POA: Insufficient documentation

## 2017-02-20 ENCOUNTER — Encounter (HOSPITAL_COMMUNITY)
Admission: RE | Admit: 2017-02-20 | Discharge: 2017-02-20 | Disposition: A | Discharge status: Home / Self Care | Payer: Medicare Other | Source: Ambulatory Visit | Attending: Cardiovascular Disease | Admitting: Cardiovascular Disease

## 2017-02-24 ENCOUNTER — Encounter (HOSPITAL_COMMUNITY)
Admission: RE | Admit: 2017-02-24 | Discharge: 2017-02-24 | Disposition: A | Discharge status: Home / Self Care | Payer: Medicare Other | Source: Ambulatory Visit | Attending: Cardiovascular Disease | Admitting: Cardiovascular Disease

## 2017-02-26 ENCOUNTER — Encounter (HOSPITAL_COMMUNITY)
Admission: RE | Admit: 2017-02-26 | Discharge: 2017-02-26 | Disposition: A | Discharge status: Home / Self Care | Payer: Medicare Other | Source: Ambulatory Visit | Attending: Cardiovascular Disease | Admitting: Cardiovascular Disease

## 2017-02-27 ENCOUNTER — Encounter (HOSPITAL_COMMUNITY): Payer: Medicare Other

## 2017-03-03 ENCOUNTER — Encounter (HOSPITAL_COMMUNITY)
Admission: RE | Admit: 2017-03-03 | Discharge: 2017-03-03 | Disposition: A | Discharge status: Home / Self Care | Payer: Medicare Other | Source: Ambulatory Visit | Attending: Cardiovascular Disease | Admitting: Cardiovascular Disease

## 2017-03-05 ENCOUNTER — Encounter (HOSPITAL_COMMUNITY)
Admission: RE | Admit: 2017-03-05 | Discharge: 2017-03-05 | Disposition: A | Discharge status: Home / Self Care | Payer: Medicare Other | Source: Ambulatory Visit | Attending: Cardiovascular Disease | Admitting: Cardiovascular Disease

## 2017-03-06 ENCOUNTER — Encounter (HOSPITAL_COMMUNITY)
Admission: RE | Admit: 2017-03-06 | Discharge: 2017-03-06 | Disposition: A | Discharge status: Home / Self Care | Payer: Medicare Other | Source: Ambulatory Visit | Attending: Cardiovascular Disease | Admitting: Cardiovascular Disease

## 2017-03-10 ENCOUNTER — Encounter (HOSPITAL_COMMUNITY): Payer: Medicare Other

## 2017-03-12 ENCOUNTER — Encounter (HOSPITAL_COMMUNITY)
Admission: RE | Admit: 2017-03-12 | Discharge: 2017-03-12 | Disposition: A | Discharge status: Home / Self Care | Payer: Medicare Other | Source: Ambulatory Visit | Attending: Cardiovascular Disease | Admitting: Cardiovascular Disease

## 2017-03-13 ENCOUNTER — Encounter (HOSPITAL_COMMUNITY)
Admission: RE | Admit: 2017-03-13 | Discharge: 2017-03-13 | Disposition: A | Discharge status: Home / Self Care | Payer: Medicare Other | Source: Ambulatory Visit | Attending: Cardiovascular Disease | Admitting: Cardiovascular Disease

## 2017-03-17 ENCOUNTER — Encounter (HOSPITAL_COMMUNITY)
Admission: RE | Admit: 2017-03-17 | Discharge: 2017-03-17 | Disposition: A | Discharge status: Home / Self Care | Payer: Medicare Other | Source: Ambulatory Visit | Attending: Cardiovascular Disease | Admitting: Cardiovascular Disease

## 2017-03-19 ENCOUNTER — Encounter (HOSPITAL_COMMUNITY)
Admission: RE | Admit: 2017-03-19 | Discharge: 2017-03-19 | Disposition: A | Discharge status: Home / Self Care | Payer: Medicare Other | Source: Ambulatory Visit | Attending: Cardiovascular Disease | Admitting: Cardiovascular Disease

## 2017-03-20 ENCOUNTER — Encounter (HOSPITAL_COMMUNITY)
Admission: RE | Admit: 2017-03-20 | Discharge: 2017-03-20 | Disposition: A | Discharge status: Home / Self Care | Payer: Medicare Other | Source: Ambulatory Visit | Attending: Cardiovascular Disease | Admitting: Cardiovascular Disease

## 2017-03-24 ENCOUNTER — Encounter (HOSPITAL_COMMUNITY)
Admission: RE | Admit: 2017-03-24 | Discharge: 2017-03-24 | Disposition: A | Discharge status: Home / Self Care | Payer: Self-pay | Source: Ambulatory Visit | Attending: Cardiovascular Disease | Admitting: Cardiovascular Disease

## 2017-03-24 DIAGNOSIS — I252 Old myocardial infarction: Secondary | ICD-10-CM | POA: Insufficient documentation

## 2017-03-24 DIAGNOSIS — I214 Non-ST elevation (NSTEMI) myocardial infarction: Secondary | ICD-10-CM | POA: Insufficient documentation

## 2017-03-26 ENCOUNTER — Encounter (HOSPITAL_COMMUNITY)
Admission: RE | Admit: 2017-03-26 | Discharge: 2017-03-26 | Disposition: A | Discharge status: Home / Self Care | Payer: Self-pay | Source: Ambulatory Visit | Attending: Cardiovascular Disease | Admitting: Cardiovascular Disease

## 2017-03-27 ENCOUNTER — Encounter (HOSPITAL_COMMUNITY): Payer: Self-pay

## 2017-03-31 ENCOUNTER — Encounter (HOSPITAL_COMMUNITY)
Admission: RE | Admit: 2017-03-31 | Discharge: 2017-03-31 | Disposition: A | Discharge status: Home / Self Care | Payer: Self-pay | Source: Ambulatory Visit | Attending: Cardiovascular Disease | Admitting: Cardiovascular Disease

## 2017-04-02 ENCOUNTER — Encounter (HOSPITAL_COMMUNITY)
Admission: RE | Admit: 2017-04-02 | Discharge: 2017-04-02 | Disposition: A | Discharge status: Home / Self Care | Payer: Self-pay | Source: Ambulatory Visit | Attending: Cardiovascular Disease | Admitting: Cardiovascular Disease

## 2017-04-03 ENCOUNTER — Encounter (HOSPITAL_COMMUNITY): Payer: Self-pay

## 2017-04-07 ENCOUNTER — Encounter (HOSPITAL_COMMUNITY)
Admission: RE | Admit: 2017-04-07 | Discharge: 2017-04-07 | Disposition: A | Discharge status: Home / Self Care | Payer: Self-pay | Source: Ambulatory Visit | Attending: Cardiovascular Disease | Admitting: Cardiovascular Disease

## 2017-04-09 ENCOUNTER — Encounter (HOSPITAL_COMMUNITY)
Admission: RE | Admit: 2017-04-09 | Discharge: 2017-04-09 | Disposition: A | Discharge status: Home / Self Care | Payer: Self-pay | Source: Ambulatory Visit | Attending: Cardiovascular Disease | Admitting: Cardiovascular Disease

## 2017-04-10 ENCOUNTER — Encounter (HOSPITAL_COMMUNITY)
Admission: RE | Admit: 2017-04-10 | Discharge: 2017-04-10 | Disposition: A | Discharge status: Home / Self Care | Payer: Self-pay | Source: Ambulatory Visit | Attending: Cardiovascular Disease | Admitting: Cardiovascular Disease

## 2017-04-14 ENCOUNTER — Encounter (HOSPITAL_COMMUNITY)
Admission: RE | Admit: 2017-04-14 | Discharge: 2017-04-14 | Disposition: A | Discharge status: Home / Self Care | Payer: Self-pay | Source: Ambulatory Visit | Attending: Cardiovascular Disease | Admitting: Cardiovascular Disease

## 2017-04-16 ENCOUNTER — Encounter (HOSPITAL_COMMUNITY): Payer: Self-pay

## 2017-04-16 DIAGNOSIS — K5010 Crohn's disease of large intestine without complications: Secondary | ICD-10-CM | POA: Diagnosis not present

## 2017-04-17 ENCOUNTER — Encounter (HOSPITAL_COMMUNITY)
Admission: RE | Admit: 2017-04-17 | Discharge: 2017-04-17 | Disposition: A | Discharge status: Home / Self Care | Payer: Self-pay | Source: Ambulatory Visit | Attending: Cardiovascular Disease | Admitting: Cardiovascular Disease

## 2017-04-18 DIAGNOSIS — Z23 Encounter for immunization: Secondary | ICD-10-CM | POA: Diagnosis not present

## 2017-04-20 ENCOUNTER — Other Ambulatory Visit: Payer: Self-pay | Admitting: *Deleted

## 2017-04-20 IMAGING — DX DG CHEST 2V
3 series · 3 of 3 positions shown · non-contrast
Comparison: 01/31/2015

CLINICAL DATA: Cough and chills.

EXAM:
CHEST  2 VIEW

[chest pa]
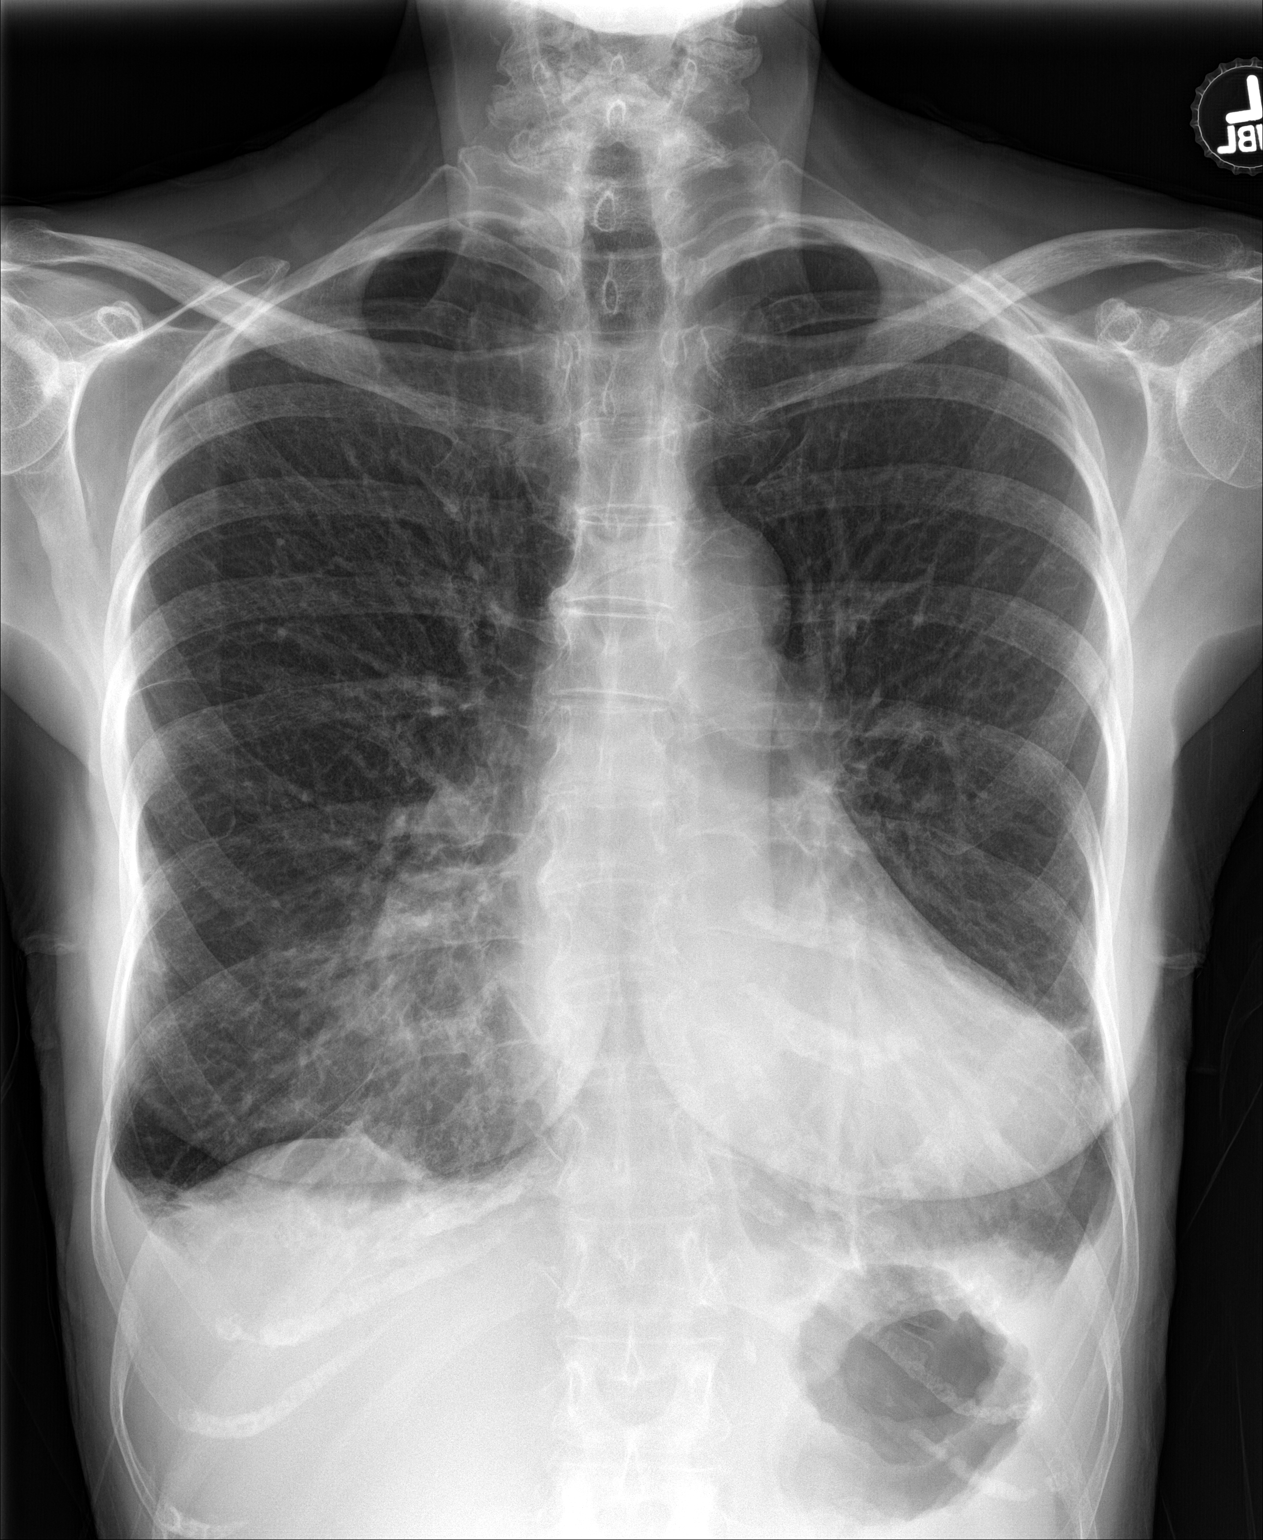

[chest lat (1 of 2)]
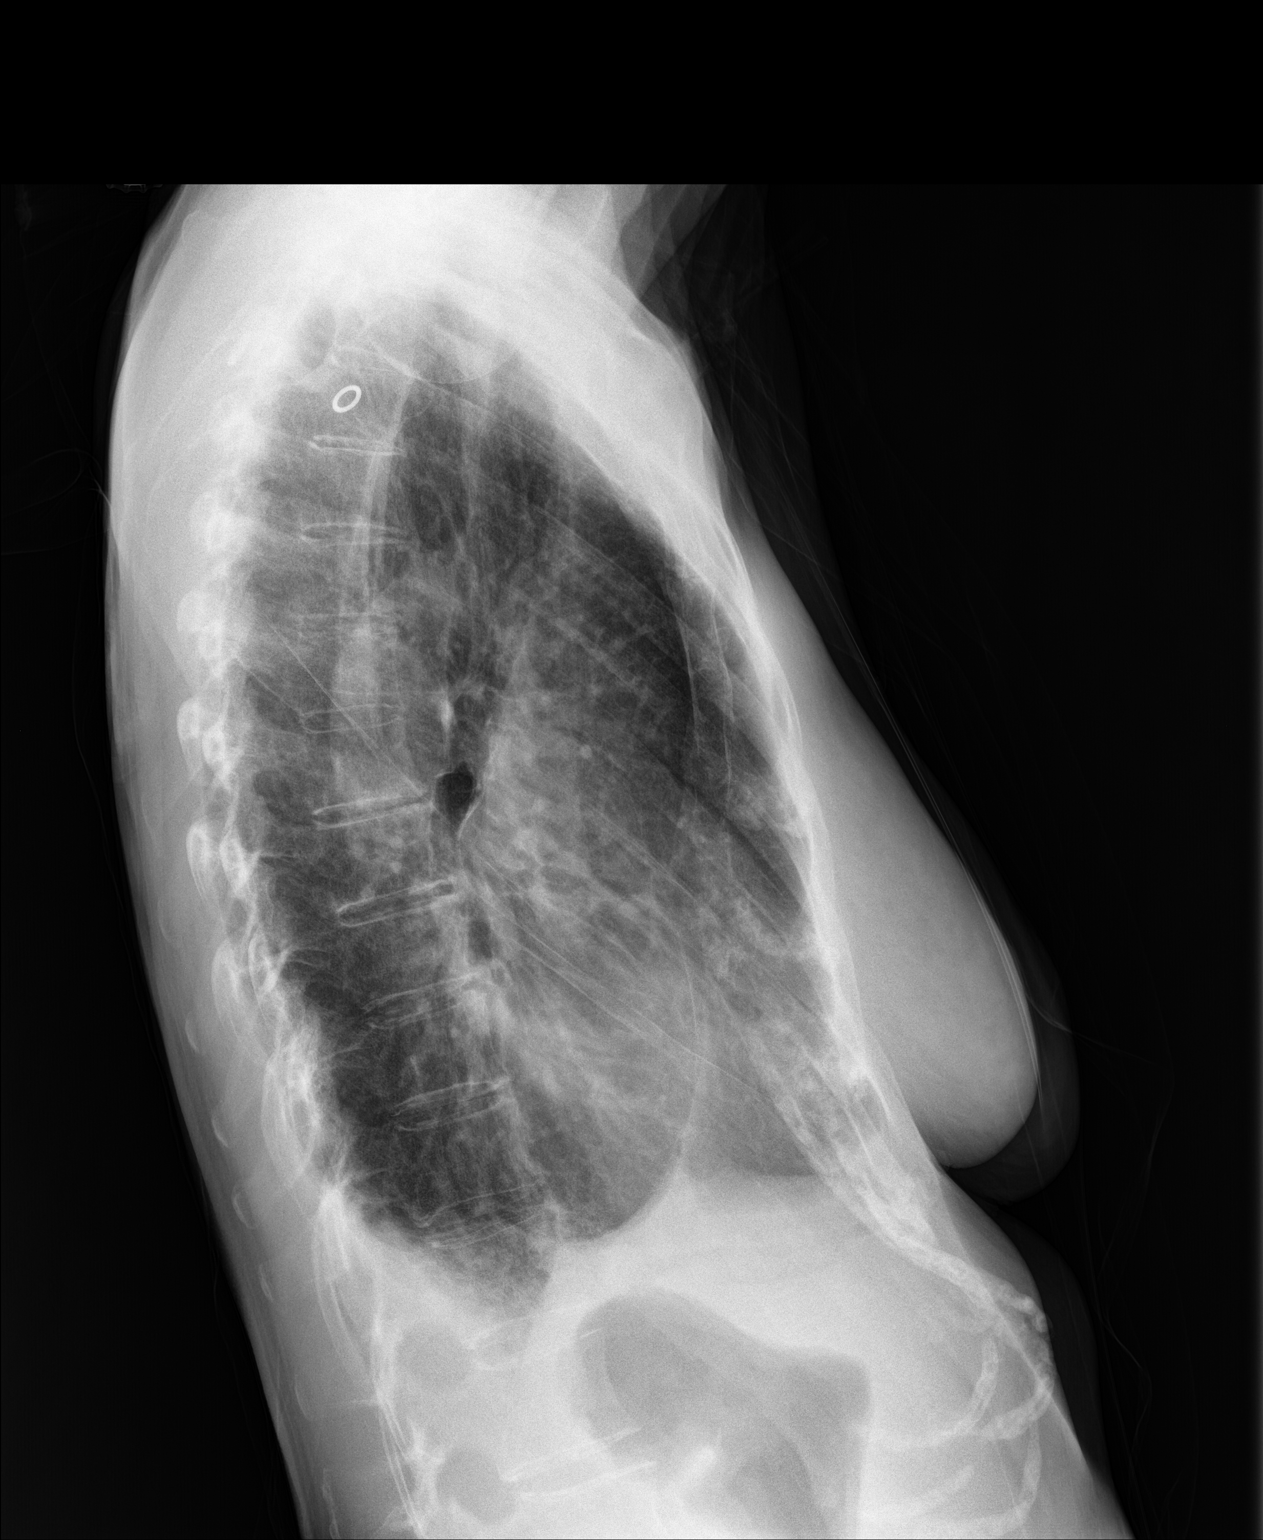

[chest lat (2 of 2)]
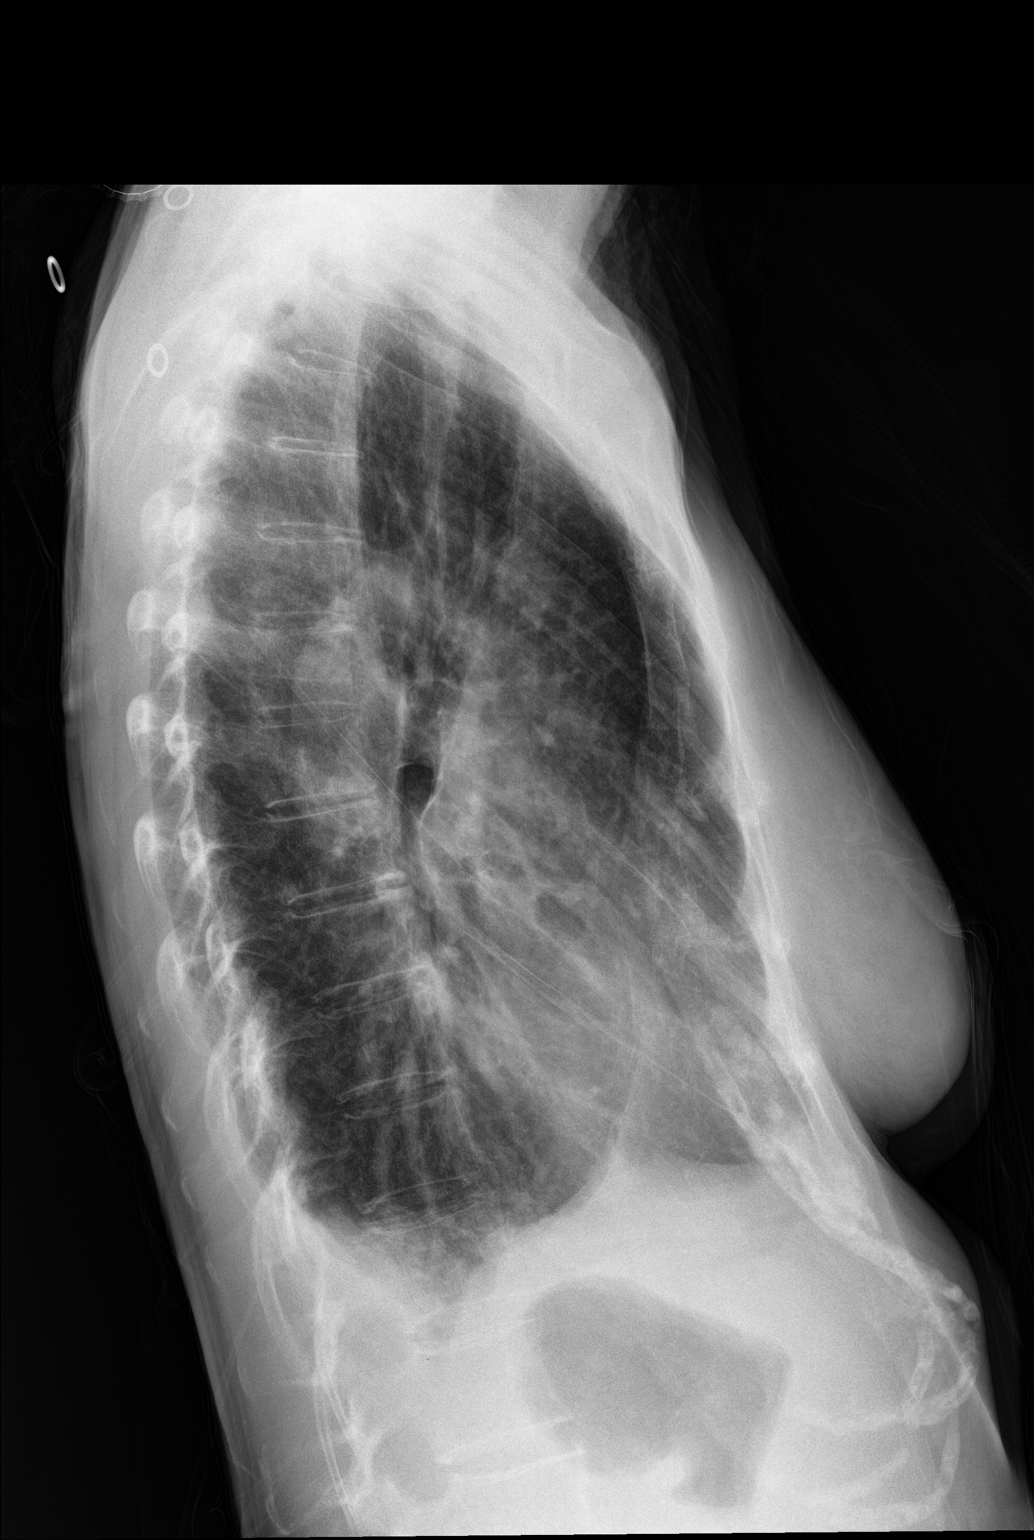

[3 of 3 positions shown; findings below may reference images not displayed]

FINDINGS: Mild cardiac enlargement. There are small bilateral pleural
effusions which are increased from previous exam. Interstitial edema
pattern appears stable to improved in the interval. The lungs are
hyperinflated and there are coarsened interstitial markings
bilaterally.
IMPRESSION: 1. Increased in small bilateral pleural effusions with improvement
in diffuse edema.

## 2017-04-21 ENCOUNTER — Encounter (HOSPITAL_COMMUNITY)
Admission: RE | Admit: 2017-04-21 | Discharge: 2017-04-21 | Disposition: A | Discharge status: Home / Self Care | Payer: Self-pay | Source: Ambulatory Visit | Attending: Cardiovascular Disease | Admitting: Cardiovascular Disease

## 2017-04-21 DIAGNOSIS — I214 Non-ST elevation (NSTEMI) myocardial infarction: Secondary | ICD-10-CM | POA: Insufficient documentation

## 2017-04-21 DIAGNOSIS — I252 Old myocardial infarction: Secondary | ICD-10-CM | POA: Insufficient documentation

## 2017-04-24 ENCOUNTER — Encounter (HOSPITAL_COMMUNITY)
Admission: RE | Admit: 2017-04-24 | Discharge: 2017-04-24 | Disposition: A | Discharge status: Home / Self Care | Payer: Self-pay | Source: Ambulatory Visit | Attending: Cardiovascular Disease | Admitting: Cardiovascular Disease

## 2017-04-28 ENCOUNTER — Encounter (HOSPITAL_COMMUNITY)
Admission: RE | Admit: 2017-04-28 | Discharge: 2017-04-28 | Disposition: A | Discharge status: Home / Self Care | Payer: Self-pay | Source: Ambulatory Visit | Attending: Cardiovascular Disease | Admitting: Cardiovascular Disease

## 2017-04-30 ENCOUNTER — Encounter (HOSPITAL_COMMUNITY)
Admission: RE | Admit: 2017-04-30 | Discharge: 2017-04-30 | Disposition: A | Discharge status: Home / Self Care | Payer: Self-pay | Source: Ambulatory Visit | Attending: Cardiovascular Disease | Admitting: Cardiovascular Disease

## 2017-05-05 ENCOUNTER — Ambulatory Visit (HOSPITAL_COMMUNITY): Payer: Medicare Other

## 2017-05-05 ENCOUNTER — Encounter (HOSPITAL_COMMUNITY)
Admission: RE | Admit: 2017-05-05 | Discharge: 2017-05-05 | Disposition: A | Discharge status: Home / Self Care | Payer: Self-pay | Source: Ambulatory Visit | Attending: Cardiovascular Disease | Admitting: Cardiovascular Disease

## 2017-05-07 ENCOUNTER — Encounter (HOSPITAL_COMMUNITY)
Admission: RE | Admit: 2017-05-07 | Discharge: 2017-05-07 | Disposition: A | Discharge status: Home / Self Care | Payer: Self-pay | Source: Ambulatory Visit | Attending: Cardiovascular Disease | Admitting: Cardiovascular Disease

## 2017-05-07 ENCOUNTER — Ambulatory Visit (HOSPITAL_COMMUNITY): Payer: Medicare Other

## 2017-05-08 ENCOUNTER — Encounter (HOSPITAL_COMMUNITY)
Admission: RE | Admit: 2017-05-08 | Discharge: 2017-05-08 | Disposition: A | Discharge status: Home / Self Care | Payer: Self-pay | Source: Ambulatory Visit | Attending: Cardiovascular Disease | Admitting: Cardiovascular Disease

## 2017-05-12 ENCOUNTER — Encounter (HOSPITAL_COMMUNITY)
Admission: RE | Admit: 2017-05-12 | Discharge: 2017-05-12 | Disposition: A | Discharge status: Home / Self Care | Payer: Self-pay | Source: Ambulatory Visit | Attending: Cardiovascular Disease | Admitting: Cardiovascular Disease

## 2017-05-12 ENCOUNTER — Ambulatory Visit (HOSPITAL_COMMUNITY): Payer: Medicare Other

## 2017-05-14 ENCOUNTER — Encounter (HOSPITAL_COMMUNITY)
Admission: RE | Admit: 2017-05-14 | Discharge: 2017-05-14 | Disposition: A | Discharge status: Home / Self Care | Payer: Self-pay | Source: Ambulatory Visit | Attending: Cardiovascular Disease | Admitting: Cardiovascular Disease

## 2017-05-14 ENCOUNTER — Ambulatory Visit (HOSPITAL_COMMUNITY): Payer: Medicare Other

## 2017-05-15 ENCOUNTER — Encounter (HOSPITAL_COMMUNITY)
Admission: RE | Admit: 2017-05-15 | Discharge: 2017-05-15 | Disposition: A | Discharge status: Home / Self Care | Payer: Self-pay | Source: Ambulatory Visit | Attending: Cardiovascular Disease | Admitting: Cardiovascular Disease

## 2017-05-19 ENCOUNTER — Ambulatory Visit (HOSPITAL_COMMUNITY): Payer: Medicare Other

## 2017-05-19 ENCOUNTER — Encounter (HOSPITAL_COMMUNITY)
Admission: RE | Admit: 2017-05-19 | Discharge: 2017-05-19 | Disposition: A | Discharge status: Home / Self Care | Payer: Self-pay | Source: Ambulatory Visit | Attending: Cardiovascular Disease | Admitting: Cardiovascular Disease

## 2017-05-21 ENCOUNTER — Ambulatory Visit (HOSPITAL_COMMUNITY): Payer: Medicare Other

## 2017-05-21 ENCOUNTER — Encounter (HOSPITAL_COMMUNITY)
Admission: RE | Admit: 2017-05-21 | Discharge: 2017-05-21 | Disposition: A | Discharge status: Home / Self Care | Payer: Self-pay | Source: Ambulatory Visit | Attending: Cardiovascular Disease | Admitting: Cardiovascular Disease

## 2017-05-21 DIAGNOSIS — I252 Old myocardial infarction: Secondary | ICD-10-CM | POA: Insufficient documentation

## 2017-05-21 DIAGNOSIS — I214 Non-ST elevation (NSTEMI) myocardial infarction: Secondary | ICD-10-CM | POA: Insufficient documentation

## 2017-05-22 ENCOUNTER — Encounter (HOSPITAL_COMMUNITY)
Admission: RE | Admit: 2017-05-22 | Discharge: 2017-05-22 | Disposition: A | Discharge status: Home / Self Care | Payer: Self-pay | Source: Ambulatory Visit | Attending: Cardiovascular Disease | Admitting: Cardiovascular Disease

## 2017-05-26 ENCOUNTER — Ambulatory Visit (HOSPITAL_COMMUNITY): Payer: Medicare Other

## 2017-05-26 ENCOUNTER — Encounter (HOSPITAL_COMMUNITY): Payer: Self-pay

## 2017-05-28 ENCOUNTER — Ambulatory Visit (HOSPITAL_COMMUNITY): Payer: Medicare Other

## 2017-05-28 ENCOUNTER — Encounter (HOSPITAL_COMMUNITY)
Admission: RE | Admit: 2017-05-28 | Discharge: 2017-05-28 | Disposition: A | Discharge status: Home / Self Care | Payer: Self-pay | Source: Ambulatory Visit | Attending: Cardiovascular Disease | Admitting: Cardiovascular Disease

## 2017-05-29 ENCOUNTER — Encounter (HOSPITAL_COMMUNITY)
Admission: RE | Admit: 2017-05-29 | Discharge: 2017-05-29 | Disposition: A | Discharge status: Home / Self Care | Payer: Self-pay | Source: Ambulatory Visit | Attending: Cardiovascular Disease | Admitting: Cardiovascular Disease

## 2017-06-02 ENCOUNTER — Encounter (HOSPITAL_COMMUNITY)
Admission: RE | Admit: 2017-06-02 | Discharge: 2017-06-02 | Disposition: A | Discharge status: Home / Self Care | Payer: Self-pay | Source: Ambulatory Visit | Attending: Cardiovascular Disease | Admitting: Cardiovascular Disease

## 2017-06-02 ENCOUNTER — Ambulatory Visit (HOSPITAL_COMMUNITY): Payer: Medicare Other

## 2017-06-04 ENCOUNTER — Encounter (HOSPITAL_COMMUNITY): Payer: Self-pay

## 2017-06-04 ENCOUNTER — Ambulatory Visit (HOSPITAL_COMMUNITY): Payer: Medicare Other

## 2017-06-05 ENCOUNTER — Encounter (HOSPITAL_COMMUNITY)
Admission: RE | Admit: 2017-06-05 | Discharge: 2017-06-05 | Disposition: A | Discharge status: Home / Self Care | Payer: Self-pay | Source: Ambulatory Visit | Attending: Cardiovascular Disease | Admitting: Cardiovascular Disease

## 2017-06-09 ENCOUNTER — Ambulatory Visit (HOSPITAL_COMMUNITY): Payer: Medicare Other

## 2017-06-09 ENCOUNTER — Encounter (HOSPITAL_COMMUNITY)
Admission: RE | Admit: 2017-06-09 | Discharge: 2017-06-09 | Disposition: A | Discharge status: Home / Self Care | Payer: Self-pay | Source: Ambulatory Visit | Attending: Cardiovascular Disease | Admitting: Cardiovascular Disease

## 2017-06-09 DIAGNOSIS — M859 Disorder of bone density and structure, unspecified: Secondary | ICD-10-CM | POA: Diagnosis not present

## 2017-06-09 DIAGNOSIS — R82998 Other abnormal findings in urine: Secondary | ICD-10-CM | POA: Diagnosis not present

## 2017-06-09 DIAGNOSIS — I10 Essential (primary) hypertension: Secondary | ICD-10-CM | POA: Diagnosis not present

## 2017-06-10 ENCOUNTER — Encounter (HOSPITAL_COMMUNITY)
Admission: RE | Admit: 2017-06-10 | Discharge: 2017-06-10 | Disposition: A | Discharge status: Home / Self Care | Payer: Self-pay | Source: Ambulatory Visit | Attending: Cardiovascular Disease | Admitting: Cardiovascular Disease

## 2017-06-16 ENCOUNTER — Ambulatory Visit (HOSPITAL_COMMUNITY): Payer: Medicare Other

## 2017-06-16 ENCOUNTER — Encounter (HOSPITAL_COMMUNITY)
Admission: RE | Admit: 2017-06-16 | Discharge: 2017-06-16 | Disposition: A | Discharge status: Home / Self Care | Payer: Self-pay | Source: Ambulatory Visit | Attending: Cardiovascular Disease | Admitting: Cardiovascular Disease

## 2017-06-18 ENCOUNTER — Encounter (HOSPITAL_COMMUNITY)
Admission: RE | Admit: 2017-06-18 | Discharge: 2017-06-18 | Disposition: A | Discharge status: Home / Self Care | Payer: Self-pay | Source: Ambulatory Visit | Attending: Cardiovascular Disease | Admitting: Cardiovascular Disease

## 2017-06-18 ENCOUNTER — Ambulatory Visit (HOSPITAL_COMMUNITY): Payer: Medicare Other

## 2017-06-19 ENCOUNTER — Encounter (HOSPITAL_COMMUNITY)
Admission: RE | Admit: 2017-06-19 | Discharge: 2017-06-19 | Disposition: A | Discharge status: Home / Self Care | Payer: Self-pay | Source: Ambulatory Visit | Attending: Cardiovascular Disease | Admitting: Cardiovascular Disease

## 2017-06-19 DIAGNOSIS — L821 Other seborrheic keratosis: Secondary | ICD-10-CM | POA: Diagnosis not present

## 2017-06-19 DIAGNOSIS — L812 Freckles: Secondary | ICD-10-CM | POA: Diagnosis not present

## 2017-06-19 DIAGNOSIS — L308 Other specified dermatitis: Secondary | ICD-10-CM | POA: Diagnosis not present

## 2017-06-19 DIAGNOSIS — I5181 Takotsubo syndrome: Secondary | ICD-10-CM | POA: Diagnosis not present

## 2017-06-19 DIAGNOSIS — N281 Cyst of kidney, acquired: Secondary | ICD-10-CM | POA: Diagnosis not present

## 2017-06-19 DIAGNOSIS — Z0000 Encounter for general adult medical examination without abnormal findings: Secondary | ICD-10-CM | POA: Diagnosis not present

## 2017-06-19 DIAGNOSIS — L820 Inflamed seborrheic keratosis: Secondary | ICD-10-CM | POA: Diagnosis not present

## 2017-06-19 DIAGNOSIS — K5090 Crohn's disease, unspecified, without complications: Secondary | ICD-10-CM | POA: Diagnosis not present

## 2017-06-23 ENCOUNTER — Ambulatory Visit (HOSPITAL_COMMUNITY): Payer: Medicare Other

## 2017-06-23 ENCOUNTER — Encounter (HOSPITAL_COMMUNITY)
Admission: RE | Admit: 2017-06-23 | Discharge: 2017-06-23 | Disposition: A | Discharge status: Home / Self Care | Payer: Self-pay | Source: Ambulatory Visit | Attending: Cardiovascular Disease | Admitting: Cardiovascular Disease

## 2017-06-23 DIAGNOSIS — I214 Non-ST elevation (NSTEMI) myocardial infarction: Secondary | ICD-10-CM | POA: Insufficient documentation

## 2017-06-23 DIAGNOSIS — I252 Old myocardial infarction: Secondary | ICD-10-CM | POA: Insufficient documentation

## 2017-06-25 ENCOUNTER — Ambulatory Visit (HOSPITAL_COMMUNITY): Payer: Medicare Other

## 2017-06-25 ENCOUNTER — Encounter (HOSPITAL_COMMUNITY)
Admission: RE | Admit: 2017-06-25 | Discharge: 2017-06-25 | Disposition: A | Discharge status: Home / Self Care | Payer: Medicare Other | Source: Ambulatory Visit | Attending: Cardiovascular Disease | Admitting: Cardiovascular Disease

## 2017-06-26 ENCOUNTER — Encounter (HOSPITAL_COMMUNITY)
Admission: RE | Admit: 2017-06-26 | Discharge: 2017-06-26 | Disposition: A | Discharge status: Home / Self Care | Payer: Medicare Other | Source: Ambulatory Visit | Attending: Cardiovascular Disease | Admitting: Cardiovascular Disease

## 2017-06-30 ENCOUNTER — Encounter (HOSPITAL_COMMUNITY): Payer: Self-pay

## 2017-06-30 ENCOUNTER — Ambulatory Visit (HOSPITAL_COMMUNITY): Payer: Medicare Other

## 2017-07-01 DIAGNOSIS — N390 Urinary tract infection, site not specified: Secondary | ICD-10-CM | POA: Diagnosis not present

## 2017-07-01 DIAGNOSIS — R300 Dysuria: Secondary | ICD-10-CM | POA: Diagnosis not present

## 2017-07-02 ENCOUNTER — Ambulatory Visit (HOSPITAL_COMMUNITY): Payer: Medicare Other

## 2017-07-02 ENCOUNTER — Encounter (HOSPITAL_COMMUNITY): Payer: Self-pay

## 2017-07-03 ENCOUNTER — Encounter (HOSPITAL_COMMUNITY): Payer: Self-pay

## 2017-07-07 ENCOUNTER — Encounter (HOSPITAL_COMMUNITY)
Admission: RE | Admit: 2017-07-07 | Discharge: 2017-07-07 | Disposition: A | Discharge status: Home / Self Care | Payer: Medicare Other | Source: Ambulatory Visit | Attending: Cardiovascular Disease | Admitting: Cardiovascular Disease

## 2017-07-07 ENCOUNTER — Ambulatory Visit (HOSPITAL_COMMUNITY): Payer: Medicare Other

## 2017-07-09 ENCOUNTER — Ambulatory Visit (HOSPITAL_COMMUNITY): Payer: Medicare Other

## 2017-07-09 ENCOUNTER — Encounter (HOSPITAL_COMMUNITY)
Admission: RE | Admit: 2017-07-09 | Discharge: 2017-07-09 | Disposition: A | Discharge status: Home / Self Care | Payer: Medicare Other | Source: Ambulatory Visit | Attending: Cardiovascular Disease | Admitting: Cardiovascular Disease

## 2017-07-10 ENCOUNTER — Encounter (HOSPITAL_COMMUNITY): Payer: Self-pay

## 2017-07-16 ENCOUNTER — Ambulatory Visit (HOSPITAL_COMMUNITY): Payer: Medicare Other

## 2017-07-16 ENCOUNTER — Encounter (HOSPITAL_COMMUNITY): Payer: Self-pay

## 2017-07-17 ENCOUNTER — Encounter (HOSPITAL_COMMUNITY): Payer: Self-pay

## 2017-07-23 ENCOUNTER — Ambulatory Visit (HOSPITAL_COMMUNITY): Payer: Medicare Other

## 2017-07-23 ENCOUNTER — Encounter (HOSPITAL_COMMUNITY)
Admission: RE | Admit: 2017-07-23 | Discharge: 2017-07-23 | Disposition: A | Discharge status: Home / Self Care | Payer: Medicare Other | Source: Ambulatory Visit | Attending: Cardiovascular Disease | Admitting: Cardiovascular Disease

## 2017-07-23 DIAGNOSIS — I214 Non-ST elevation (NSTEMI) myocardial infarction: Secondary | ICD-10-CM | POA: Insufficient documentation

## 2017-07-23 DIAGNOSIS — I252 Old myocardial infarction: Secondary | ICD-10-CM | POA: Insufficient documentation

## 2017-07-24 ENCOUNTER — Encounter (HOSPITAL_COMMUNITY)
Admission: RE | Admit: 2017-07-24 | Discharge: 2017-07-24 | Disposition: A | Discharge status: Home / Self Care | Payer: Medicare Other | Source: Ambulatory Visit | Attending: Cardiovascular Disease | Admitting: Cardiovascular Disease

## 2017-07-28 ENCOUNTER — Encounter (HOSPITAL_COMMUNITY)
Admission: RE | Admit: 2017-07-28 | Discharge: 2017-07-28 | Disposition: A | Discharge status: Home / Self Care | Payer: Medicare Other | Source: Ambulatory Visit | Attending: Cardiovascular Disease | Admitting: Cardiovascular Disease

## 2017-07-28 ENCOUNTER — Ambulatory Visit (HOSPITAL_COMMUNITY): Payer: Medicare Other

## 2017-07-30 ENCOUNTER — Encounter (HOSPITAL_COMMUNITY)
Admission: RE | Admit: 2017-07-30 | Discharge: 2017-07-30 | Disposition: A | Discharge status: Home / Self Care | Payer: Medicare Other | Source: Ambulatory Visit | Attending: Cardiovascular Disease | Admitting: Cardiovascular Disease

## 2017-07-30 ENCOUNTER — Ambulatory Visit (HOSPITAL_COMMUNITY): Payer: Medicare Other

## 2017-07-31 ENCOUNTER — Encounter (HOSPITAL_COMMUNITY)
Admission: RE | Admit: 2017-07-31 | Discharge: 2017-07-31 | Disposition: A | Discharge status: Home / Self Care | Payer: Medicare Other | Source: Ambulatory Visit | Attending: Cardiovascular Disease | Admitting: Cardiovascular Disease

## 2017-07-31 ENCOUNTER — Telehealth: Payer: Self-pay | Admitting: Cardiovascular Disease

## 2017-07-31 NOTE — Telephone Encounter (Signed)
New Message  Pt c/o BP issue: STAT if pt c/o blurred vision, one-sided weakness or slurred speech  1. What are your last 5 BP readings? Per pt states for the pass week her bp has ranged, she states between 150-180/80-90  2. Are you having any other symptoms (ex. Dizziness, headache, blurred vision, passed out)? Headache   3. What is your BP issue? Pt would like to discuss adjusting her medication please call back

## 2017-07-31 NOTE — Telephone Encounter (Signed)
Reviewed with Dr. Burt Knack.  Per Dr. Burt Knack, instructed patient to increase Coreg to 6.25 mg BID over the weekend for better blood pressure control. She will call next week with an update to hopefully drop back down to 3.125 mg BID. She reports she called her PCP and they simply told her to wait for Cardiology. Instructed her to call her GI MD to discuss medications she can take for HA that will not affect her Crohn's disease.  She was grateful for call and agrees with treatment plan.

## 2017-07-31 NOTE — Telephone Encounter (Signed)
Patient reports "terrible headaches" and high blood pressure for about 1 week.  She states her systolic usually runs around 90-110 but her blood pressure has been running between 150-190/80-90. She has had intermittent "really bad" headaches in the top of her head for the past week as well. She has a history of migraines but hasn't had one for years- "they seemed to go away with menopause."  She says she went to the ED about one year ago for another bad headache she everything checked out fine.  She has taken Tylenol and it does not work. Excedrine Migraine helps, but she cannot take much because of her Chron's. She is confused and does not know if headaches are causing HTN or if HTN is causing headaches. The only times she has checked her BP in the last week at home are when she has the headaches.  Her most recent BP was taken at Cardiac Rehab today was 132/92. She did not have a headache at the time. Informed patient Dr. Burt Knack would be consulted about her BP. Instructed her to call her PCP as well.

## 2017-08-04 ENCOUNTER — Ambulatory Visit (HOSPITAL_COMMUNITY): Payer: Medicare Other

## 2017-08-04 ENCOUNTER — Encounter (HOSPITAL_COMMUNITY)
Admission: RE | Admit: 2017-08-04 | Discharge: 2017-08-04 | Disposition: A | Discharge status: Home / Self Care | Payer: Self-pay | Source: Ambulatory Visit | Attending: Cardiovascular Disease | Admitting: Cardiovascular Disease

## 2017-08-05 ENCOUNTER — Telehealth: Payer: Self-pay | Admitting: Cardiovascular Disease

## 2017-08-05 NOTE — Telephone Encounter (Addendum)
Patient called for an update after increasing coreg to 6.25 mg BID last week (see 1/11 phone note). Her headache has resolved, but her BP remains in the 140-150/80-90 range despite still taking the increased dose of Coreg. Originally, the intent was to increase Coreg until BP had decreased and then return back to 3.125 mg BID. She continues her Lotensin 5 mg daily and other meds as directed. She is anxious and requests further medication recommendations from Dr. Burt Knack.

## 2017-08-05 NOTE — Telephone Encounter (Signed)
New Message     Patient is returning your call, she will be in a meeting until 3pm please call her after then.

## 2017-08-06 ENCOUNTER — Ambulatory Visit (HOSPITAL_COMMUNITY): Payer: Medicare Other

## 2017-08-06 ENCOUNTER — Encounter (HOSPITAL_COMMUNITY)
Admission: RE | Admit: 2017-08-06 | Discharge: 2017-08-06 | Disposition: A | Discharge status: Home / Self Care | Payer: Medicare Other | Source: Ambulatory Visit | Attending: Cardiovascular Disease | Admitting: Cardiovascular Disease

## 2017-08-06 NOTE — Telephone Encounter (Signed)
Chart reviewed. Would increase lotensin to 10 mg daily and arrange FU in Pharm-D HTN clinic. thx

## 2017-08-07 ENCOUNTER — Encounter (HOSPITAL_COMMUNITY)
Admission: RE | Admit: 2017-08-07 | Discharge: 2017-08-07 | Disposition: A | Discharge status: Home / Self Care | Payer: Self-pay | Source: Ambulatory Visit | Attending: Cardiovascular Disease | Admitting: Cardiovascular Disease

## 2017-08-07 MED ORDER — CARVEDILOL 6.25 MG PO TABS: 6.2500 mg | ORAL_TABLET | Freq: Two times a day (BID) | ORAL | 3 refills | Status: DC

## 2017-08-07 NOTE — Telephone Encounter (Signed)
Patient reports her BP was back down to normal today - 102/68. At this time, she will continue the increased dose of Coreg (6.25 mg BID) and hold off on increasing Lotensin.  She will keep record of her BP and call if it gets too high or too low. She was grateful for assistance.

## 2017-08-11 ENCOUNTER — Encounter (HOSPITAL_COMMUNITY)
Admission: RE | Admit: 2017-08-11 | Discharge: 2017-08-11 | Disposition: A | Discharge status: Home / Self Care | Payer: Medicare Other | Source: Ambulatory Visit | Attending: Cardiovascular Disease | Admitting: Cardiovascular Disease

## 2017-08-11 ENCOUNTER — Ambulatory Visit (HOSPITAL_COMMUNITY): Payer: Medicare Other

## 2017-08-13 ENCOUNTER — Ambulatory Visit (HOSPITAL_COMMUNITY): Payer: Medicare Other

## 2017-08-13 ENCOUNTER — Encounter (HOSPITAL_COMMUNITY)
Admission: RE | Admit: 2017-08-13 | Discharge: 2017-08-13 | Disposition: A | Discharge status: Home / Self Care | Payer: Medicare Other | Source: Ambulatory Visit | Attending: Cardiovascular Disease | Admitting: Cardiovascular Disease

## 2017-08-14 ENCOUNTER — Encounter (HOSPITAL_COMMUNITY)
Admission: RE | Admit: 2017-08-14 | Discharge: 2017-08-14 | Disposition: A | Discharge status: Home / Self Care | Payer: Medicare Other | Source: Ambulatory Visit | Attending: Cardiovascular Disease | Admitting: Cardiovascular Disease

## 2017-08-18 ENCOUNTER — Encounter (HOSPITAL_COMMUNITY)
Admission: RE | Admit: 2017-08-18 | Discharge: 2017-08-18 | Disposition: A | Discharge status: Home / Self Care | Payer: Medicare Other | Source: Ambulatory Visit | Attending: Cardiovascular Disease | Admitting: Cardiovascular Disease

## 2017-08-18 ENCOUNTER — Ambulatory Visit (HOSPITAL_COMMUNITY): Payer: Medicare Other

## 2017-08-20 ENCOUNTER — Ambulatory Visit (HOSPITAL_COMMUNITY): Payer: Medicare Other

## 2017-08-20 ENCOUNTER — Encounter (HOSPITAL_COMMUNITY): Payer: Self-pay

## 2017-08-21 ENCOUNTER — Encounter (HOSPITAL_COMMUNITY)
Admission: RE | Admit: 2017-08-21 | Discharge: 2017-08-21 | Disposition: A | Discharge status: Home / Self Care | Payer: Medicare Other | Source: Ambulatory Visit | Attending: Cardiovascular Disease | Admitting: Cardiovascular Disease

## 2017-08-21 DIAGNOSIS — I252 Old myocardial infarction: Secondary | ICD-10-CM | POA: Insufficient documentation

## 2017-08-21 DIAGNOSIS — I214 Non-ST elevation (NSTEMI) myocardial infarction: Secondary | ICD-10-CM | POA: Insufficient documentation

## 2017-08-25 ENCOUNTER — Encounter (HOSPITAL_COMMUNITY)
Admission: RE | Admit: 2017-08-25 | Discharge: 2017-08-25 | Disposition: A | Discharge status: Home / Self Care | Payer: Medicare Other | Source: Ambulatory Visit | Attending: Cardiovascular Disease | Admitting: Cardiovascular Disease

## 2017-08-25 ENCOUNTER — Ambulatory Visit (HOSPITAL_COMMUNITY): Payer: Medicare Other

## 2017-08-27 ENCOUNTER — Ambulatory Visit (HOSPITAL_COMMUNITY): Payer: Medicare Other

## 2017-08-27 ENCOUNTER — Encounter (HOSPITAL_COMMUNITY)
Admission: RE | Admit: 2017-08-27 | Discharge: 2017-08-27 | Disposition: A | Discharge status: Home / Self Care | Payer: Medicare Other | Source: Ambulatory Visit | Attending: Cardiovascular Disease | Admitting: Cardiovascular Disease

## 2017-08-28 ENCOUNTER — Encounter (HOSPITAL_COMMUNITY)
Admission: RE | Admit: 2017-08-28 | Discharge: 2017-08-28 | Disposition: A | Discharge status: Home / Self Care | Payer: Medicare Other | Source: Ambulatory Visit | Attending: Cardiovascular Disease | Admitting: Cardiovascular Disease

## 2017-08-31 DIAGNOSIS — I10 Essential (primary) hypertension: Secondary | ICD-10-CM | POA: Diagnosis not present

## 2017-08-31 DIAGNOSIS — Z681 Body mass index (BMI) 19 or less, adult: Secondary | ICD-10-CM | POA: Diagnosis not present

## 2017-08-31 DIAGNOSIS — F418 Other specified anxiety disorders: Secondary | ICD-10-CM | POA: Diagnosis not present

## 2017-09-01 ENCOUNTER — Encounter (HOSPITAL_COMMUNITY)
Admission: RE | Admit: 2017-09-01 | Discharge: 2017-09-01 | Disposition: A | Discharge status: Home / Self Care | Payer: Medicare Other | Source: Ambulatory Visit | Attending: Cardiovascular Disease | Admitting: Cardiovascular Disease

## 2017-09-01 ENCOUNTER — Ambulatory Visit (HOSPITAL_COMMUNITY): Payer: Medicare Other

## 2017-09-03 ENCOUNTER — Ambulatory Visit (HOSPITAL_COMMUNITY): Payer: Medicare Other

## 2017-09-03 ENCOUNTER — Encounter (HOSPITAL_COMMUNITY)
Admission: RE | Admit: 2017-09-03 | Discharge: 2017-09-03 | Disposition: A | Discharge status: Home / Self Care | Payer: Medicare Other | Source: Ambulatory Visit | Attending: Cardiovascular Disease | Admitting: Cardiovascular Disease

## 2017-09-04 ENCOUNTER — Encounter (HOSPITAL_COMMUNITY)
Admission: RE | Admit: 2017-09-04 | Discharge: 2017-09-04 | Disposition: A | Discharge status: Home / Self Care | Payer: Self-pay | Source: Ambulatory Visit | Attending: Cardiovascular Disease | Admitting: Cardiovascular Disease

## 2017-09-08 ENCOUNTER — Ambulatory Visit (HOSPITAL_COMMUNITY): Payer: Medicare Other

## 2017-09-08 ENCOUNTER — Encounter (HOSPITAL_COMMUNITY): Payer: Self-pay

## 2017-09-10 ENCOUNTER — Encounter (HOSPITAL_COMMUNITY)
Admission: RE | Admit: 2017-09-10 | Discharge: 2017-09-10 | Disposition: A | Discharge status: Home / Self Care | Payer: Medicare Other | Source: Ambulatory Visit | Attending: Cardiovascular Disease | Admitting: Cardiovascular Disease

## 2017-09-10 ENCOUNTER — Ambulatory Visit (HOSPITAL_COMMUNITY): Payer: Medicare Other

## 2017-09-11 ENCOUNTER — Encounter (HOSPITAL_COMMUNITY): Payer: Self-pay

## 2017-09-15 ENCOUNTER — Ambulatory Visit (HOSPITAL_COMMUNITY): Payer: Medicare Other

## 2017-09-15 ENCOUNTER — Encounter (HOSPITAL_COMMUNITY)
Admission: RE | Admit: 2017-09-15 | Discharge: 2017-09-15 | Disposition: A | Discharge status: Home / Self Care | Payer: Medicare Other | Source: Ambulatory Visit | Attending: Cardiovascular Disease | Admitting: Cardiovascular Disease

## 2017-09-17 ENCOUNTER — Ambulatory Visit (HOSPITAL_COMMUNITY): Payer: Medicare Other

## 2017-09-17 ENCOUNTER — Encounter (HOSPITAL_COMMUNITY)
Admission: RE | Admit: 2017-09-17 | Discharge: 2017-09-17 | Disposition: A | Discharge status: Home / Self Care | Payer: Medicare Other | Source: Ambulatory Visit | Attending: Cardiovascular Disease | Admitting: Cardiovascular Disease

## 2017-09-18 ENCOUNTER — Encounter (HOSPITAL_COMMUNITY)
Admission: RE | Admit: 2017-09-18 | Discharge: 2017-09-18 | Disposition: A | Discharge status: Home / Self Care | Payer: Medicare Other | Source: Ambulatory Visit | Attending: Cardiovascular Disease | Admitting: Cardiovascular Disease

## 2017-09-18 DIAGNOSIS — I214 Non-ST elevation (NSTEMI) myocardial infarction: Secondary | ICD-10-CM | POA: Insufficient documentation

## 2017-09-18 DIAGNOSIS — I252 Old myocardial infarction: Secondary | ICD-10-CM | POA: Insufficient documentation

## 2017-09-22 ENCOUNTER — Encounter (HOSPITAL_COMMUNITY)
Admission: RE | Admit: 2017-09-22 | Discharge: 2017-09-22 | Disposition: A | Discharge status: Home / Self Care | Payer: Medicare Other | Source: Ambulatory Visit | Attending: Cardiovascular Disease | Admitting: Cardiovascular Disease

## 2017-09-22 ENCOUNTER — Other Ambulatory Visit: Payer: Self-pay | Admitting: Obstetrics and Gynecology

## 2017-09-22 ENCOUNTER — Ambulatory Visit (HOSPITAL_COMMUNITY): Payer: Medicare Other

## 2017-09-22 DIAGNOSIS — Z1231 Encounter for screening mammogram for malignant neoplasm of breast: Secondary | ICD-10-CM

## 2017-09-24 ENCOUNTER — Ambulatory Visit (HOSPITAL_COMMUNITY): Payer: Medicare Other

## 2017-09-24 ENCOUNTER — Encounter (HOSPITAL_COMMUNITY)
Admission: RE | Admit: 2017-09-24 | Discharge: 2017-09-24 | Disposition: A | Discharge status: Home / Self Care | Payer: Medicare Other | Source: Ambulatory Visit | Attending: Cardiovascular Disease | Admitting: Cardiovascular Disease

## 2017-09-25 ENCOUNTER — Encounter (HOSPITAL_COMMUNITY)
Admission: RE | Admit: 2017-09-25 | Discharge: 2017-09-25 | Disposition: A | Discharge status: Home / Self Care | Payer: Medicare Other | Source: Ambulatory Visit | Attending: Cardiovascular Disease | Admitting: Cardiovascular Disease

## 2017-09-27 ENCOUNTER — Other Ambulatory Visit: Payer: Self-pay | Admitting: Cardiovascular Disease

## 2017-09-28 ENCOUNTER — Other Ambulatory Visit: Payer: Self-pay | Admitting: Cardiovascular Disease

## 2017-09-28 DIAGNOSIS — F4321 Adjustment disorder with depressed mood: Secondary | ICD-10-CM | POA: Diagnosis not present

## 2017-09-28 DIAGNOSIS — F418 Other specified anxiety disorders: Secondary | ICD-10-CM | POA: Diagnosis not present

## 2017-09-28 DIAGNOSIS — F329 Major depressive disorder, single episode, unspecified: Secondary | ICD-10-CM | POA: Diagnosis not present

## 2017-09-29 ENCOUNTER — Ambulatory Visit (HOSPITAL_COMMUNITY): Payer: Medicare Other

## 2017-09-29 ENCOUNTER — Encounter (HOSPITAL_COMMUNITY)
Admission: RE | Admit: 2017-09-29 | Discharge: 2017-09-29 | Disposition: A | Discharge status: Home / Self Care | Payer: Medicare Other | Source: Ambulatory Visit | Attending: Cardiovascular Disease | Admitting: Cardiovascular Disease

## 2017-10-01 ENCOUNTER — Encounter (HOSPITAL_COMMUNITY)
Admission: RE | Admit: 2017-10-01 | Discharge: 2017-10-01 | Disposition: A | Discharge status: Home / Self Care | Payer: Medicare Other | Source: Ambulatory Visit | Attending: Cardiovascular Disease | Admitting: Cardiovascular Disease

## 2017-10-01 ENCOUNTER — Ambulatory Visit (HOSPITAL_COMMUNITY): Payer: Medicare Other

## 2017-10-01 DIAGNOSIS — I10 Essential (primary) hypertension: Secondary | ICD-10-CM | POA: Diagnosis not present

## 2017-10-01 DIAGNOSIS — F418 Other specified anxiety disorders: Secondary | ICD-10-CM | POA: Diagnosis not present

## 2017-10-01 DIAGNOSIS — N939 Abnormal uterine and vaginal bleeding, unspecified: Secondary | ICD-10-CM | POA: Diagnosis not present

## 2017-10-01 DIAGNOSIS — R51 Headache: Secondary | ICD-10-CM | POA: Diagnosis not present

## 2017-10-02 ENCOUNTER — Encounter (HOSPITAL_COMMUNITY)
Admission: RE | Admit: 2017-10-02 | Discharge: 2017-10-02 | Disposition: A | Discharge status: Home / Self Care | Payer: Medicare Other | Source: Ambulatory Visit | Attending: Cardiovascular Disease | Admitting: Cardiovascular Disease

## 2017-10-06 ENCOUNTER — Encounter (HOSPITAL_COMMUNITY)
Admission: RE | Admit: 2017-10-06 | Discharge: 2017-10-06 | Disposition: A | Discharge status: Home / Self Care | Payer: Medicare Other | Source: Ambulatory Visit | Attending: Cardiovascular Disease | Admitting: Cardiovascular Disease

## 2017-10-06 ENCOUNTER — Ambulatory Visit (HOSPITAL_COMMUNITY): Payer: Medicare Other

## 2017-10-06 DIAGNOSIS — N950 Postmenopausal bleeding: Secondary | ICD-10-CM | POA: Diagnosis not present

## 2017-10-08 ENCOUNTER — Encounter (HOSPITAL_COMMUNITY)
Admission: RE | Admit: 2017-10-08 | Discharge: 2017-10-08 | Disposition: A | Discharge status: Home / Self Care | Payer: Medicare Other | Source: Ambulatory Visit | Attending: Cardiovascular Disease | Admitting: Cardiovascular Disease

## 2017-10-08 ENCOUNTER — Ambulatory Visit (HOSPITAL_COMMUNITY): Payer: Medicare Other

## 2017-10-09 ENCOUNTER — Encounter (HOSPITAL_COMMUNITY)
Admission: RE | Admit: 2017-10-09 | Discharge: 2017-10-09 | Disposition: A | Discharge status: Home / Self Care | Payer: Medicare Other | Source: Ambulatory Visit | Attending: Cardiovascular Disease | Admitting: Cardiovascular Disease

## 2017-10-13 ENCOUNTER — Encounter (HOSPITAL_COMMUNITY)
Admission: RE | Admit: 2017-10-13 | Discharge: 2017-10-13 | Disposition: A | Discharge status: Home / Self Care | Payer: Self-pay | Source: Ambulatory Visit | Attending: Cardiovascular Disease | Admitting: Cardiovascular Disease

## 2017-10-13 ENCOUNTER — Ambulatory Visit (HOSPITAL_COMMUNITY): Payer: Medicare Other

## 2017-10-14 DIAGNOSIS — N950 Postmenopausal bleeding: Secondary | ICD-10-CM | POA: Diagnosis not present

## 2017-10-15 ENCOUNTER — Ambulatory Visit (HOSPITAL_COMMUNITY): Payer: Medicare Other

## 2017-10-15 ENCOUNTER — Encounter (HOSPITAL_COMMUNITY)
Admission: RE | Admit: 2017-10-15 | Discharge: 2017-10-15 | Disposition: A | Discharge status: Home / Self Care | Payer: Medicare Other | Source: Ambulatory Visit | Attending: Cardiovascular Disease | Admitting: Cardiovascular Disease

## 2017-10-16 ENCOUNTER — Encounter (HOSPITAL_COMMUNITY)
Admission: RE | Admit: 2017-10-16 | Discharge: 2017-10-16 | Disposition: A | Discharge status: Home / Self Care | Payer: Medicare Other | Source: Ambulatory Visit | Attending: Cardiovascular Disease | Admitting: Cardiovascular Disease

## 2017-10-19 ENCOUNTER — Other Ambulatory Visit: Payer: Self-pay | Admitting: Cardiovascular Disease

## 2017-10-20 ENCOUNTER — Encounter (HOSPITAL_COMMUNITY)
Admission: RE | Admit: 2017-10-20 | Discharge: 2017-10-20 | Disposition: A | Discharge status: Home / Self Care | Payer: Medicare Other | Source: Ambulatory Visit | Attending: Cardiovascular Disease | Admitting: Cardiovascular Disease

## 2017-10-20 ENCOUNTER — Ambulatory Visit (HOSPITAL_COMMUNITY): Payer: Medicare Other

## 2017-10-20 DIAGNOSIS — K5010 Crohn's disease of large intestine without complications: Secondary | ICD-10-CM | POA: Diagnosis not present

## 2017-10-20 DIAGNOSIS — I252 Old myocardial infarction: Secondary | ICD-10-CM | POA: Insufficient documentation

## 2017-10-20 DIAGNOSIS — Z8719 Personal history of other diseases of the digestive system: Secondary | ICD-10-CM | POA: Diagnosis not present

## 2017-10-20 DIAGNOSIS — R1031 Right lower quadrant pain: Secondary | ICD-10-CM | POA: Diagnosis not present

## 2017-10-20 DIAGNOSIS — R102 Pelvic and perineal pain: Secondary | ICD-10-CM | POA: Diagnosis not present

## 2017-10-20 DIAGNOSIS — I214 Non-ST elevation (NSTEMI) myocardial infarction: Secondary | ICD-10-CM | POA: Insufficient documentation

## 2017-10-22 ENCOUNTER — Encounter (HOSPITAL_COMMUNITY)
Admission: RE | Admit: 2017-10-22 | Discharge: 2017-10-22 | Disposition: A | Discharge status: Home / Self Care | Payer: Medicare Other | Source: Ambulatory Visit | Attending: Cardiovascular Disease | Admitting: Cardiovascular Disease

## 2017-10-22 ENCOUNTER — Ambulatory Visit (HOSPITAL_COMMUNITY): Payer: Medicare Other

## 2017-10-23 ENCOUNTER — Encounter (HOSPITAL_COMMUNITY): Payer: Self-pay

## 2017-10-26 DIAGNOSIS — N952 Postmenopausal atrophic vaginitis: Secondary | ICD-10-CM | POA: Diagnosis not present

## 2017-10-26 DIAGNOSIS — F418 Other specified anxiety disorders: Secondary | ICD-10-CM | POA: Diagnosis not present

## 2017-10-26 DIAGNOSIS — F329 Major depressive disorder, single episode, unspecified: Secondary | ICD-10-CM | POA: Diagnosis not present

## 2017-10-27 ENCOUNTER — Ambulatory Visit (HOSPITAL_COMMUNITY): Payer: Medicare Other

## 2017-10-27 ENCOUNTER — Encounter (HOSPITAL_COMMUNITY)
Admission: RE | Admit: 2017-10-27 | Discharge: 2017-10-27 | Disposition: A | Discharge status: Home / Self Care | Payer: Medicare Other | Source: Ambulatory Visit | Attending: Cardiovascular Disease | Admitting: Cardiovascular Disease

## 2017-10-28 ENCOUNTER — Encounter: Payer: Self-pay | Admitting: Physician Assistant

## 2017-10-29 ENCOUNTER — Ambulatory Visit (HOSPITAL_COMMUNITY): Payer: Medicare Other

## 2017-10-29 ENCOUNTER — Encounter (HOSPITAL_COMMUNITY)
Admission: RE | Admit: 2017-10-29 | Discharge: 2017-10-29 | Disposition: A | Discharge status: Home / Self Care | Payer: Medicare Other | Source: Ambulatory Visit | Attending: Cardiovascular Disease | Admitting: Cardiovascular Disease

## 2017-10-30 ENCOUNTER — Encounter (HOSPITAL_COMMUNITY)
Admission: RE | Admit: 2017-10-30 | Discharge: 2017-10-30 | Disposition: A | Discharge status: Home / Self Care | Payer: Self-pay | Source: Ambulatory Visit | Attending: Cardiovascular Disease | Admitting: Cardiovascular Disease

## 2017-10-30 DIAGNOSIS — I10 Essential (primary) hypertension: Secondary | ICD-10-CM | POA: Diagnosis not present

## 2017-10-30 DIAGNOSIS — F418 Other specified anxiety disorders: Secondary | ICD-10-CM | POA: Diagnosis not present

## 2017-11-02 DIAGNOSIS — H1032 Unspecified acute conjunctivitis, left eye: Secondary | ICD-10-CM | POA: Diagnosis not present

## 2017-11-03 ENCOUNTER — Ambulatory Visit (HOSPITAL_COMMUNITY): Payer: Medicare Other

## 2017-11-03 ENCOUNTER — Ambulatory Visit: Payer: Medicare Other | Admitting: Physician Assistant

## 2017-11-03 ENCOUNTER — Encounter (HOSPITAL_COMMUNITY)
Admission: RE | Admit: 2017-11-03 | Discharge: 2017-11-03 | Disposition: A | Discharge status: Home / Self Care | Payer: Medicare Other | Source: Ambulatory Visit | Attending: Cardiovascular Disease | Admitting: Cardiovascular Disease

## 2017-11-03 ENCOUNTER — Other Ambulatory Visit: Payer: Self-pay | Admitting: *Deleted

## 2017-11-03 ENCOUNTER — Telehealth: Payer: Self-pay | Admitting: Cardiovascular Disease

## 2017-11-03 MED ORDER — BENAZEPRIL HCL 5 MG PO TABS: ORAL_TABLET | ORAL | 0 refills | Status: DC

## 2017-11-03 NOTE — Telephone Encounter (Signed)
°*  STAT* If patient is at the pharmacy, call can be transferred to refill team.   1. Which medications need to be refilled? (please list name of each medication and dose if known) Benazepril 5 mg  2. Which pharmacy/location (including street and city if local pharmacy) is medication to be sent to? [facility] [place], [address] AT [address]  3. Do they need a 30 day or 90 day supply? [place]

## 2017-11-03 NOTE — Progress Notes (Deleted)
Cardiology Office Note:    Date:  11/03/2017   ID:  Kirsten Rice, DOB 1946-03-13, MRN [id number]  PCP:  Reynold Bowen, MD  Cardiologist:  No primary care provider on file.   Referring MD: Reynold Bowen, MD   No chief complaint on file. ***  History of Present Illness:    Kirsten Rice is a 72 y.o. female with takotsubo cardiomyopathy diagnosed in 2007 with recurrence in 2016.  She had normal coronary arteries by cardiac catheterization in 2016.  EF was 25-35% with significant wall motion abnormalities.  Follow-up echo demonstrated normalized LV function.  She was last seen by Dr. Burt Knack April 2018.  She had some issues with her blood pressure running high in January 2019 and we made some adjustments in her beta-blocker.       Kirsten Rice ***  Prior CV studies:   The following studies were reviewed today:  Echo 03/12/15 EF 55-60%, mild MR  LHC 01/31/15 There is severe left ventricular systolic dysfunction in a pattern of stress induced/Takatsubo cardiomyopathy. No significant CAD. EF 25-35% with apical ballooning  Past Medical History:  Diagnosis Date  . Arthritis    "probably a little bit in my knees and hips" (01/31/2015)  . Basal cell carcinoma   . Chronic systolic CHF (congestive heart failure) ([place])    a. 01/2015 Echo: EF 25-35% in setting of takotsubo cardiomyopathy.  . Crohn's disease ([place])   . Deep vein thrombosis ([place])    pt denies: 04/08/13  . Essential hypertension   . History of pneumonia    "2-3 times" (01/31/2015)  . Hyperthyroidism    pt denies:  04/08/13  . Mitral valve prolapse    a. 01/2015 Echo: Ef 25-35%, triv MR.  . Myocardial infarct ([place])   . Ocular headache    "weekly" (01/31/2015)  . Persistent dry cough   . PONV (postoperative nausea and vomiting) 1970's   "when I had my appendix out"  . Raynaud's disease   . Squamous carcinoma   . Takotsubo cardiomyopathy    a. 2007 Cath: nl cors, apical ballooning w/ subsequent recovery of LV fxn;  b. 01/2015  NSTEMI/Cath: nl cors, EF 25-25% w/ apical ballooning;  c. 01/2015 echo: EF 30%, septal, apical, mid anterior, inf HK, mildly dil LA/RA.  Marland Kitchen Telangiectasia    Surgical Hx: The patient  has a past surgical history that includes Shoulder arthroscopy w/ rotator cuff repair (Right, 1996); Tubal ligation (1973); Appendectomy (1971); Colonoscopy; Colonoscopy (05/11/2012); Cardiac catheterization (2006; 2007); Excision basal cell carcinoma; Squamous cell carcinoma excision (Left, early 1980's); and Cardiac catheterization (N/A, 01/31/2015).   Current Medications: No outpatient medications have been marked as taking for the 11/03/17 encounter (Appointment) with Richardson Dopp T, PA-C.     Allergies:   Cafergot [ergotamine-caffeine]; Compazine [prochlorperazine edisylate]; Ergotamine-caffeine; Ergotamine-caffeine; Prochlorperazine; Prochlorperazine edisylate; Tetanus-diphtheria toxoids td; Other; and Tetanus toxoid adsorbed   Social History   Tobacco Use  . Smoking status: Never Smoker  . Smokeless tobacco: Never Used  Substance Use Topics  . Alcohol use: Yes    Comment: daily  . Drug use: No     Family Hx: The patient's family history includes Aortic stenosis in her father; Diabetes in her sister and sister; Hypertension in her father and mother; Hyperthyroidism in her sister, sister, sister, and sister. There is no history of Colon cancer or Breast cancer.  ROS:   Please see the history of present illness.    ROS All other systems reviewed and are negative.  EKGs/Labs/Other Test Reviewed:    EKG:  EKG is *** ordered today.  The ekg ordered today demonstrates ***  Recent Labs: No results found for requested labs within last 8760 hours.   Recent Lipid Panel Lab Results  Component Value Date/Time   CHOL 158 02/01/2015 03:32 AM   TRIG 65 02/01/2015 03:32 AM   HDL 66 02/01/2015 03:32 AM   CHOLHDL 2.4 02/01/2015 03:32 AM   LDLCALC 79 02/01/2015 03:32 AM    Physical Exam:    VS:  There  were no vitals taken for this visit.    Wt Readings from Last 3 Encounters:  10/30/16 117 lb (53.1 kg)  04/03/16 121 lb (54.9 kg)  09/19/15 120 lb 1.9 oz (54.5 kg)     ***Physical Exam  ASSESSMENT & PLAN:    Takotsubo cardiomyopathy  Benign essential HTN*** 1. Tako-Tsubo Cardiomyopathy:  She has a hx of recurrent stress induced CM.  Echo in 8/16 demonstrated improved LVF.  She notes some issues with her Raynaud's  We discussed changing Carvedilol to Metoprolol Succinate to see if this would help.  She prefers to hold off for now.  She can call if she changes her mind.  Continue beta-blocker, ACE inhibitor.   2. HTN:  Controlled.    Dispo:  No follow-ups on file.   Medication Adjustments/Labs and Tests Ordered: Current medicines are reviewed at length with the patient today.  Concerns regarding medicines are outlined above.  Tests Ordered: No orders of the defined types were placed in this encounter.  Medication Changes: No orders of the defined types were placed in this encounter.   Signed, Richardson Dopp, PA-C  11/03/2017 1:17 PM    [facility] [place], [place], [place]  [address] Phone: [phone]; Fax: [phone]

## 2017-11-05 ENCOUNTER — Encounter (HOSPITAL_COMMUNITY)
Admission: RE | Admit: 2017-11-05 | Discharge: 2017-11-05 | Disposition: A | Discharge status: Home / Self Care | Payer: Medicare Other | Source: Ambulatory Visit | Attending: Cardiovascular Disease | Admitting: Cardiovascular Disease

## 2017-11-05 ENCOUNTER — Ambulatory Visit (HOSPITAL_COMMUNITY): Payer: Medicare Other

## 2017-11-06 ENCOUNTER — Ambulatory Visit
Admission: RE | Admit: 2017-11-06 | Discharge: 2017-11-06 | Disposition: A | Discharge status: Home / Self Care | Payer: Medicare Other | Source: Ambulatory Visit | Attending: Obstetrics and Gynecology | Admitting: Obstetrics and Gynecology

## 2017-11-06 ENCOUNTER — Encounter (HOSPITAL_COMMUNITY)
Admission: RE | Admit: 2017-11-06 | Discharge: 2017-11-06 | Disposition: A | Discharge status: Home / Self Care | Payer: Medicare Other | Source: Ambulatory Visit | Attending: Cardiovascular Disease | Admitting: Cardiovascular Disease

## 2017-11-06 DIAGNOSIS — Z1231 Encounter for screening mammogram for malignant neoplasm of breast: Secondary | ICD-10-CM

## 2017-11-10 ENCOUNTER — Ambulatory Visit (HOSPITAL_COMMUNITY): Payer: Medicare Other

## 2017-11-10 ENCOUNTER — Encounter (HOSPITAL_COMMUNITY)
Admission: RE | Admit: 2017-11-10 | Discharge: 2017-11-10 | Disposition: A | Discharge status: Home / Self Care | Payer: Medicare Other | Source: Ambulatory Visit | Attending: Cardiovascular Disease | Admitting: Cardiovascular Disease

## 2017-11-12 ENCOUNTER — Encounter (HOSPITAL_COMMUNITY)
Admission: RE | Admit: 2017-11-12 | Discharge: 2017-11-12 | Disposition: A | Discharge status: Home / Self Care | Payer: Medicare Other | Source: Ambulatory Visit | Attending: Cardiovascular Disease | Admitting: Cardiovascular Disease

## 2017-11-12 ENCOUNTER — Ambulatory Visit (HOSPITAL_COMMUNITY): Payer: Medicare Other

## 2017-11-13 ENCOUNTER — Encounter (HOSPITAL_COMMUNITY)
Admission: RE | Admit: 2017-11-13 | Discharge: 2017-11-13 | Disposition: A | Discharge status: Home / Self Care | Payer: Medicare Other | Source: Ambulatory Visit | Attending: Cardiovascular Disease | Admitting: Cardiovascular Disease

## 2017-11-16 DIAGNOSIS — F4321 Adjustment disorder with depressed mood: Secondary | ICD-10-CM | POA: Diagnosis not present

## 2017-11-16 DIAGNOSIS — F418 Other specified anxiety disorders: Secondary | ICD-10-CM | POA: Diagnosis not present

## 2017-11-16 DIAGNOSIS — F329 Major depressive disorder, single episode, unspecified: Secondary | ICD-10-CM | POA: Diagnosis not present

## 2017-11-17 ENCOUNTER — Encounter (HOSPITAL_COMMUNITY)
Admission: RE | Admit: 2017-11-17 | Discharge: 2017-11-17 | Disposition: A | Discharge status: Home / Self Care | Payer: Medicare Other | Source: Ambulatory Visit | Attending: Cardiovascular Disease | Admitting: Cardiovascular Disease

## 2017-11-17 ENCOUNTER — Ambulatory Visit (HOSPITAL_COMMUNITY): Payer: Medicare Other

## 2017-11-19 ENCOUNTER — Ambulatory Visit (HOSPITAL_COMMUNITY): Payer: Medicare Other

## 2017-11-19 ENCOUNTER — Encounter (HOSPITAL_COMMUNITY)
Admission: RE | Admit: 2017-11-19 | Discharge: 2017-11-19 | Disposition: A | Discharge status: Home / Self Care | Payer: Self-pay | Source: Ambulatory Visit | Attending: Cardiovascular Disease | Admitting: Cardiovascular Disease

## 2017-11-19 DIAGNOSIS — H04123 Dry eye syndrome of bilateral lacrimal glands: Secondary | ICD-10-CM | POA: Diagnosis not present

## 2017-11-19 DIAGNOSIS — H3540 Unspecified peripheral retinal degeneration: Secondary | ICD-10-CM | POA: Diagnosis not present

## 2017-11-19 DIAGNOSIS — I214 Non-ST elevation (NSTEMI) myocardial infarction: Secondary | ICD-10-CM | POA: Insufficient documentation

## 2017-11-19 DIAGNOSIS — H25813 Combined forms of age-related cataract, bilateral: Secondary | ICD-10-CM | POA: Diagnosis not present

## 2017-11-19 DIAGNOSIS — H5201 Hypermetropia, right eye: Secondary | ICD-10-CM | POA: Diagnosis not present

## 2017-11-20 ENCOUNTER — Encounter (HOSPITAL_COMMUNITY): Payer: Self-pay

## 2017-11-24 ENCOUNTER — Ambulatory Visit (HOSPITAL_COMMUNITY): Payer: Medicare Other

## 2017-11-24 ENCOUNTER — Encounter (HOSPITAL_COMMUNITY): Payer: Self-pay

## 2017-11-25 ENCOUNTER — Ambulatory Visit: Payer: Medicare Other | Admitting: Cardiovascular Disease

## 2017-11-25 ENCOUNTER — Encounter: Payer: Self-pay | Admitting: Cardiovascular Disease

## 2017-11-25 VITALS — BP 132/74 | HR 63 | Ht 67.5 in | Wt 115.0 lb

## 2017-11-25 DIAGNOSIS — I10 Essential (primary) hypertension: Secondary | ICD-10-CM | POA: Diagnosis not present

## 2017-11-25 DIAGNOSIS — I5181 Takotsubo syndrome: Secondary | ICD-10-CM

## 2017-11-25 MED ORDER — BENAZEPRIL HCL 10 MG PO TABS: 10.0000 mg | ORAL_TABLET | Freq: Every day | ORAL | 3 refills | Status: DC

## 2017-11-25 MED ORDER — CARVEDILOL 3.125 MG PO TABS: 3.1250 mg | ORAL_TABLET | Freq: Two times a day (BID) | ORAL | 3 refills | Status: DC

## 2017-11-25 NOTE — Patient Instructions (Signed)
Medication Instructions:  1) DECREASE COREG to 3.125 mg twice daily. Check your blood pressure periodically and call in about 1 month. We will decide at that time to call in refills or stop the medicine altogether.  Labwork: none  Testing/Procedures: none  Follow-Up: Your provider wants you to follow-up in: 1 year with Dr. Burt Knack. You will receive a reminder letter in the mail two months in advance. If you don't receive a letter, please call our office to schedule the follow-up appointment.    Any Other Special Instructions Will Be Listed Below (If Applicable).     If you need a refill on your cardiac medications before your next appointment, please call your pharmacy.

## 2017-11-25 NOTE — Progress Notes (Signed)
Cardiology Office Note Date:  11/25/2017   ID:  Kirsten Rice, DOB 29-Nov-1945, MRN [id number]  PCP:  Reynold Bowen, MD  Cardiologist:  Sherren Mocha, MD    Chief Complaint  Patient presents with  . Follow-up    Takotsubo's Cardiomyopathy     History of Present Illness: Kirsten Rice is a 72 y.o. female who presents for follow-up of Takotsubo's Syndrome. She initially presented with Takotsubo's cardiomyopathy in 2007 and did well until 2016 when she had a recurrence. She had normal coronaries at cath in 2016, LVEF was reduced at 25-35% in typical Takotsubo's pattern. Follow-up echo showed normalization of LV function with an LVEF of 60%.  The patient is here alone today.  She is doing fairly well.  She does have some pain in her toes and feels like this is related to her raynaud's. Raynaud's has been worse since taking carvedilol.  Her stress levels have been a little better since she has placed her husband in a memory unit.  She was under severe stress with him in the house because of his progressing dementia and tendency to be violent.  She denies any recent chest pain, shortness of breath, or heart palpitations.    Past Medical History:  Diagnosis Date  . Arthritis    "probably a little bit in my knees and hips" (01/31/2015)  . Basal cell carcinoma   . Chronic systolic CHF (congestive heart failure) ([place])    a. 01/2015 Echo: EF 25-35% in setting of takotsubo cardiomyopathy.  . Crohn's disease ([place])   . Deep vein thrombosis ([place])    pt denies: 04/08/13  . Essential hypertension   . History of pneumonia    "2-3 times" (01/31/2015)  . Hyperthyroidism    pt denies:  04/08/13  . Mitral valve prolapse    a. 01/2015 Echo: Ef 25-35%, triv MR.  . Myocardial infarct ([place])   . Ocular headache    "weekly" (01/31/2015)  . Persistent dry cough   . PONV (postoperative nausea and vomiting) 1970's   "when I had my appendix out"  . Raynaud's disease   . Squamous carcinoma   . Takotsubo  cardiomyopathy    a. 2007 Cath: nl cors, apical ballooning w/ subsequent recovery of LV fxn;  b. 01/2015 NSTEMI/Cath: nl cors, EF 25-25% w/ apical ballooning;  c. 01/2015 echo: EF 30%, septal, apical, mid anterior, inf HK, mildly dil LA/RA.  Marland Kitchen Telangiectasia     Past Surgical History:  Procedure Laterality Date  . APPENDECTOMY  1971  . BASAL CELL CARCINOMA EXCISION     forehead, back, legs, arms" (01/31/2015)  . CARDIAC CATHETERIZATION  2006; 2007  . CARDIAC CATHETERIZATION N/A 01/31/2015   Procedure: Left Heart Cath and Coronary Angiography;  Surgeon: Jettie Booze, MD;  Location: [facility];  Service: Cardiovascular;  Laterality: N/A;  . COLONOSCOPY    . COLONOSCOPY  05/11/2012   Procedure: COLONOSCOPY;  Surgeon: Lafayette Dragon, MD;  Location: [facility];  Service: Endoscopy;  Laterality: N/A;  . SHOULDER ARTHROSCOPY W/ ROTATOR CUFF REPAIR Right 1996  . SQUAMOUS CELL CARCINOMA EXCISION Left early 1980's   "eyelid"  . TUBAL LIGATION  1973    Current Outpatient Medications  Medication Sig Dispense Refill  . acetaminophen (TYLENOL) 325 MG tablet Take 650 mg by mouth every 6 (six) hours as needed for mild pain or headache.    Marland Kitchen aspirin 81 MG tablet Take 81 mg by mouth daily.      . balsalazide (COLAZAL)  750 MG capsule Take 2,250 mg by mouth 3 (three) times daily.     . benazepril (LOTENSIN) 10 MG tablet Take 10 mg by mouth daily.    . bifidobacterium infantis (ALIGN) capsule Take 1 capsule by mouth daily.    . Biotin 2500 MCG CAPS Take 2,500 mcg by mouth daily.     . Calcium-Vitamin D 600-200 MG-UNIT per tablet Take 1 tablet by mouth daily.     . carvedilol (COREG) 6.25 MG tablet Take 1 tablet (6.25 mg total) by mouth 2 (two) times daily with a meal. 180 tablet 3  . clobetasol cream (TEMOVATE) 1.61 % Apply 1 application topically at bedtime as needed (dry skin on hands).   0  . fluticasone (FLONASE) 50 MCG/ACT nasal spray USE 2 SPRAYS IN EACH NOSTRIL DAILY AS NEEDED FOR  CONGESTION. 16 g 5  . furosemide (LASIX) 20 MG tablet Take 1 tablet (20 mg total) by mouth daily. 90 tablet 0  . nitroGLYCERIN (NITROSTAT) 0.4 MG SL tablet DISSOLVE 1 TABLET UNDER THE TONGUE EVERY 5 MINUTES FOR UP TO 3 DOSES AS NEEDED FOR CHEST PAIN 75 tablet 1  . potassium chloride SA (K-DUR,KLOR-CON) 20 MEQ tablet Take 1 tablet (20 mEq total) by mouth daily. 90 tablet 3  . PRESCRIPTION MEDICATION Apply 1 application topically daily as needed (rosasea). Finacea Cream, uses PRN    . RESTASIS 0.05 % ophthalmic emulsion Place 1 drop into both eyes 2 (two) times daily.   3  . vitamin C (ASCORBIC ACID) 500 MG tablet Take 500 mg by mouth daily.       No current facility-administered medications for this visit.     Allergies:   Cafergot [ergotamine-caffeine]; Compazine [prochlorperazine edisylate]; Ergotamine-caffeine; Ergotamine-caffeine; Prochlorperazine; Prochlorperazine edisylate; Tetanus-diphtheria toxoids td; Other; and Tetanus toxoid adsorbed   Social History:  The patient  reports that she has never smoked. She has never used smokeless tobacco. She reports that she drinks alcohol. She reports that she does not use drugs.   Family History:  The patient's family history includes Aortic stenosis in her father; Diabetes in her sister and sister; Hypertension in her father and mother; Hyperthyroidism in her sister, sister, sister, and sister.    ROS:  Please see the history of present illness.  Otherwise, review of systems is positive for back pain, muscle pain, headaches.  All other systems are reviewed and negative.   PHYSICAL EXAM: VS:  BP 132/74   Pulse 63   Ht 5' 7.5" (1.715 m)   Wt 115 lb (52.2 kg)   SpO2 98%   BMI 17.75 kg/m  , BMI Body mass index is 17.75 kg/m. GEN: Well nourished, well developed, in no acute distress  HEENT: normal  Neck: no JVD, no masses. No carotid bruits Cardiac: RRR without murmur or gallop                Respiratory:  clear to auscultation bilaterally,  normal work of breathing GI: soft, nontender, nondistended, + BS MS: no deformity or atrophy  Ext: no pretibial edema, pedal pulses 2+= bilaterally Skin: warm and dry, no rash Neuro:  Strength and sensation are intact Psych: euthymic mood, full affect  EKG:  EKG is ordered today. The ekg ordered today shows normal sinus rhythm 63 bpm, possible LVH, left atrial enlargement, otherwise within normal limits.  Recent Labs: No results found for requested labs within last 8760 hours.   Lipid Panel     Component Value Date/Time   CHOL 158 02/01/2015 0332  TRIG 65 02/01/2015 0332   HDL 66 02/01/2015 0332   CHOLHDL 2.4 02/01/2015 0332   VLDL 13 02/01/2015 0332   LDLCALC 79 02/01/2015 0332      Wt Readings from Last 3 Encounters:  11/25/17 115 lb (52.2 kg)  10/30/16 117 lb (53.1 kg)  04/03/16 121 lb (54.9 kg)    ASSESSMENT AND PLAN: 1.  Takotsubo's Cardiomyopathy: The patient is clinically stable with normalization of LV function after her second event occurred a few years ago.  I think it is reasonable to try to wean her off of carvedilol since it is exacerbating ray nods phenomenon.  Recommend decrease carvedilol to 3.125 mg twice daily x2 weeks then stop as long as blood pressure remains controlled.  She brings in home readings today which are reviewed.  2.  Hypertension: Continue benazepril.  Wean off of carvedilol.  Can titrate benazepril or add a different class of medication if needed in the future.  Current medicines are reviewed with the patient today.  The patient does not have concerns regarding medicines.  Labs/ tests ordered today include:  No orders of the defined types were placed in this encounter.   Disposition:   FU one year  Signed, Sherren Mocha, MD  11/25/2017 2:16 PM    [facility] [place], [place], [place]  [address] Phone: [phone]; Fax: [phone]

## 2017-11-26 ENCOUNTER — Encounter (HOSPITAL_COMMUNITY)
Admission: RE | Admit: 2017-11-26 | Discharge: 2017-11-26 | Disposition: A | Discharge status: Home / Self Care | Payer: Self-pay | Source: Ambulatory Visit | Attending: Cardiovascular Disease | Admitting: Cardiovascular Disease

## 2017-11-26 ENCOUNTER — Ambulatory Visit (HOSPITAL_COMMUNITY): Payer: Medicare Other

## 2017-11-27 ENCOUNTER — Encounter (HOSPITAL_COMMUNITY)
Admission: RE | Admit: 2017-11-27 | Discharge: 2017-11-27 | Disposition: A | Discharge status: Home / Self Care | Payer: Self-pay | Source: Ambulatory Visit | Attending: Cardiovascular Disease | Admitting: Cardiovascular Disease

## 2017-12-01 ENCOUNTER — Encounter (HOSPITAL_COMMUNITY)
Admission: RE | Admit: 2017-12-01 | Discharge: 2017-12-01 | Disposition: A | Discharge status: Home / Self Care | Payer: Self-pay | Source: Ambulatory Visit | Attending: Cardiovascular Disease | Admitting: Cardiovascular Disease

## 2017-12-03 ENCOUNTER — Encounter (HOSPITAL_COMMUNITY)
Admission: RE | Admit: 2017-12-03 | Discharge: 2017-12-03 | Disposition: A | Discharge status: Home / Self Care | Payer: Self-pay | Source: Ambulatory Visit | Attending: Cardiovascular Disease | Admitting: Cardiovascular Disease

## 2017-12-04 ENCOUNTER — Encounter (HOSPITAL_COMMUNITY): Payer: Self-pay

## 2017-12-04 DIAGNOSIS — K5010 Crohn's disease of large intestine without complications: Secondary | ICD-10-CM | POA: Diagnosis not present

## 2017-12-04 DIAGNOSIS — N281 Cyst of kidney, acquired: Secondary | ICD-10-CM | POA: Diagnosis not present

## 2017-12-04 DIAGNOSIS — J479 Bronchiectasis, uncomplicated: Secondary | ICD-10-CM | POA: Diagnosis not present

## 2017-12-08 ENCOUNTER — Encounter (HOSPITAL_COMMUNITY)
Admission: RE | Admit: 2017-12-08 | Discharge: 2017-12-08 | Disposition: A | Discharge status: Home / Self Care | Payer: Self-pay | Source: Ambulatory Visit | Attending: Cardiovascular Disease | Admitting: Cardiovascular Disease

## 2017-12-10 ENCOUNTER — Encounter (HOSPITAL_COMMUNITY)
Admission: RE | Admit: 2017-12-10 | Discharge: 2017-12-10 | Disposition: A | Discharge status: Home / Self Care | Payer: Self-pay | Source: Ambulatory Visit | Attending: Cardiovascular Disease | Admitting: Cardiovascular Disease

## 2017-12-11 ENCOUNTER — Encounter (HOSPITAL_COMMUNITY)
Admission: RE | Admit: 2017-12-11 | Discharge: 2017-12-11 | Disposition: A | Discharge status: Home / Self Care | Payer: Self-pay | Source: Ambulatory Visit | Attending: Cardiovascular Disease | Admitting: Cardiovascular Disease

## 2017-12-15 ENCOUNTER — Encounter (HOSPITAL_COMMUNITY)
Admission: RE | Admit: 2017-12-15 | Discharge: 2017-12-15 | Disposition: A | Discharge status: Home / Self Care | Payer: Self-pay | Source: Ambulatory Visit | Attending: Cardiovascular Disease | Admitting: Cardiovascular Disease

## 2017-12-16 DIAGNOSIS — F329 Major depressive disorder, single episode, unspecified: Secondary | ICD-10-CM | POA: Diagnosis not present

## 2017-12-16 DIAGNOSIS — F418 Other specified anxiety disorders: Secondary | ICD-10-CM | POA: Diagnosis not present

## 2017-12-17 ENCOUNTER — Encounter (HOSPITAL_COMMUNITY)
Admission: RE | Admit: 2017-12-17 | Discharge: 2017-12-17 | Disposition: A | Discharge status: Home / Self Care | Payer: Self-pay | Source: Ambulatory Visit | Attending: Cardiovascular Disease | Admitting: Cardiovascular Disease

## 2017-12-18 ENCOUNTER — Other Ambulatory Visit: Payer: Self-pay | Admitting: Cardiovascular Disease

## 2017-12-18 ENCOUNTER — Encounter (HOSPITAL_COMMUNITY)
Admission: RE | Admit: 2017-12-18 | Discharge: 2017-12-18 | Disposition: A | Discharge status: Home / Self Care | Payer: Self-pay | Source: Ambulatory Visit | Attending: Cardiovascular Disease | Admitting: Cardiovascular Disease

## 2017-12-21 DIAGNOSIS — N281 Cyst of kidney, acquired: Secondary | ICD-10-CM | POA: Diagnosis not present

## 2017-12-21 DIAGNOSIS — R202 Paresthesia of skin: Secondary | ICD-10-CM | POA: Diagnosis not present

## 2017-12-21 DIAGNOSIS — E7849 Other hyperlipidemia: Secondary | ICD-10-CM | POA: Diagnosis not present

## 2017-12-21 DIAGNOSIS — F3289 Other specified depressive episodes: Secondary | ICD-10-CM | POA: Diagnosis not present

## 2017-12-21 DIAGNOSIS — I10 Essential (primary) hypertension: Secondary | ICD-10-CM | POA: Diagnosis not present

## 2017-12-22 ENCOUNTER — Encounter (HOSPITAL_COMMUNITY)
Admission: RE | Admit: 2017-12-22 | Discharge: 2017-12-22 | Disposition: A | Discharge status: Home / Self Care | Payer: Medicare Other | Source: Ambulatory Visit | Attending: Cardiovascular Disease | Admitting: Cardiovascular Disease

## 2017-12-22 DIAGNOSIS — I214 Non-ST elevation (NSTEMI) myocardial infarction: Secondary | ICD-10-CM | POA: Insufficient documentation

## 2017-12-24 ENCOUNTER — Encounter (HOSPITAL_COMMUNITY)
Admission: RE | Admit: 2017-12-24 | Discharge: 2017-12-24 | Disposition: A | Discharge status: Home / Self Care | Payer: Medicare Other | Source: Ambulatory Visit | Attending: Cardiovascular Disease | Admitting: Cardiovascular Disease

## 2017-12-25 ENCOUNTER — Encounter (HOSPITAL_COMMUNITY)
Admission: RE | Admit: 2017-12-25 | Discharge: 2017-12-25 | Disposition: A | Discharge status: Home / Self Care | Payer: Medicare Other | Source: Ambulatory Visit | Attending: Cardiovascular Disease | Admitting: Cardiovascular Disease

## 2017-12-29 ENCOUNTER — Encounter (HOSPITAL_COMMUNITY)
Admission: RE | Admit: 2017-12-29 | Discharge: 2017-12-29 | Disposition: A | Discharge status: Home / Self Care | Payer: Medicare Other | Source: Ambulatory Visit | Attending: Cardiovascular Disease | Admitting: Cardiovascular Disease

## 2017-12-30 DIAGNOSIS — Z681 Body mass index (BMI) 19 or less, adult: Secondary | ICD-10-CM | POA: Diagnosis not present

## 2017-12-30 DIAGNOSIS — Z01419 Encounter for gynecological examination (general) (routine) without abnormal findings: Secondary | ICD-10-CM | POA: Diagnosis not present

## 2017-12-31 ENCOUNTER — Encounter (HOSPITAL_COMMUNITY)
Admission: RE | Admit: 2017-12-31 | Discharge: 2017-12-31 | Disposition: A | Discharge status: Home / Self Care | Payer: Medicare Other | Source: Ambulatory Visit | Attending: Cardiovascular Disease | Admitting: Cardiovascular Disease

## 2018-01-01 ENCOUNTER — Encounter (HOSPITAL_COMMUNITY)
Admission: RE | Admit: 2018-01-01 | Discharge: 2018-01-01 | Disposition: A | Discharge status: Home / Self Care | Payer: Medicare Other | Source: Ambulatory Visit | Attending: Cardiovascular Disease | Admitting: Cardiovascular Disease

## 2018-01-04 DIAGNOSIS — F418 Other specified anxiety disorders: Secondary | ICD-10-CM | POA: Diagnosis not present

## 2018-01-04 DIAGNOSIS — F329 Major depressive disorder, single episode, unspecified: Secondary | ICD-10-CM | POA: Diagnosis not present

## 2018-01-05 ENCOUNTER — Encounter (HOSPITAL_COMMUNITY)
Admission: RE | Admit: 2018-01-05 | Discharge: 2018-01-05 | Disposition: A | Discharge status: Home / Self Care | Payer: Medicare Other | Source: Ambulatory Visit | Attending: Cardiovascular Disease | Admitting: Cardiovascular Disease

## 2018-01-07 ENCOUNTER — Encounter (HOSPITAL_COMMUNITY)
Admission: RE | Admit: 2018-01-07 | Discharge: 2018-01-07 | Disposition: A | Discharge status: Home / Self Care | Payer: Medicare Other | Source: Ambulatory Visit | Attending: Cardiovascular Disease | Admitting: Cardiovascular Disease

## 2018-01-08 ENCOUNTER — Encounter (HOSPITAL_COMMUNITY)
Admission: RE | Admit: 2018-01-08 | Discharge: 2018-01-08 | Disposition: A | Discharge status: Home / Self Care | Payer: Medicare Other | Source: Ambulatory Visit | Attending: Cardiovascular Disease | Admitting: Cardiovascular Disease

## 2018-01-11 DIAGNOSIS — K5010 Crohn's disease of large intestine without complications: Secondary | ICD-10-CM | POA: Diagnosis not present

## 2018-01-12 ENCOUNTER — Encounter (HOSPITAL_COMMUNITY)
Admission: RE | Admit: 2018-01-12 | Discharge: 2018-01-12 | Disposition: A | Discharge status: Home / Self Care | Payer: Medicare Other | Source: Ambulatory Visit | Attending: Cardiovascular Disease | Admitting: Cardiovascular Disease

## 2018-01-14 ENCOUNTER — Encounter (HOSPITAL_COMMUNITY)
Admission: RE | Admit: 2018-01-14 | Discharge: 2018-01-14 | Disposition: A | Discharge status: Home / Self Care | Payer: Medicare Other | Source: Ambulatory Visit | Attending: Cardiovascular Disease | Admitting: Cardiovascular Disease

## 2018-01-14 DIAGNOSIS — F418 Other specified anxiety disorders: Secondary | ICD-10-CM | POA: Diagnosis not present

## 2018-01-14 DIAGNOSIS — F329 Major depressive disorder, single episode, unspecified: Secondary | ICD-10-CM | POA: Diagnosis not present

## 2018-01-15 ENCOUNTER — Encounter (HOSPITAL_COMMUNITY): Payer: Self-pay

## 2018-01-18 ENCOUNTER — Other Ambulatory Visit: Payer: Self-pay | Admitting: Cardiovascular Disease

## 2018-01-19 ENCOUNTER — Encounter (HOSPITAL_COMMUNITY)
Admission: RE | Admit: 2018-01-19 | Discharge: 2018-01-19 | Disposition: A | Discharge status: Home / Self Care | Payer: Medicare Other | Source: Ambulatory Visit | Attending: Cardiovascular Disease | Admitting: Cardiovascular Disease

## 2018-01-19 DIAGNOSIS — I214 Non-ST elevation (NSTEMI) myocardial infarction: Secondary | ICD-10-CM | POA: Insufficient documentation

## 2018-01-22 ENCOUNTER — Encounter (HOSPITAL_COMMUNITY)
Admission: RE | Admit: 2018-01-22 | Discharge: 2018-01-22 | Disposition: A | Discharge status: Home / Self Care | Payer: Medicare Other | Source: Ambulatory Visit | Attending: Cardiovascular Disease | Admitting: Cardiovascular Disease

## 2018-01-26 ENCOUNTER — Encounter (HOSPITAL_COMMUNITY)
Admission: RE | Admit: 2018-01-26 | Discharge: 2018-01-26 | Disposition: A | Discharge status: Home / Self Care | Payer: Medicare Other | Source: Ambulatory Visit | Attending: Cardiovascular Disease | Admitting: Cardiovascular Disease

## 2018-01-28 ENCOUNTER — Encounter (HOSPITAL_COMMUNITY): Payer: Self-pay

## 2018-01-29 ENCOUNTER — Encounter (HOSPITAL_COMMUNITY): Payer: Self-pay

## 2018-02-02 ENCOUNTER — Encounter (HOSPITAL_COMMUNITY)
Admission: RE | Admit: 2018-02-02 | Discharge: 2018-02-02 | Disposition: A | Discharge status: Home / Self Care | Payer: Medicare Other | Source: Ambulatory Visit | Attending: Cardiovascular Disease | Admitting: Cardiovascular Disease

## 2018-02-04 ENCOUNTER — Encounter (HOSPITAL_COMMUNITY)
Admission: RE | Admit: 2018-02-04 | Discharge: 2018-02-04 | Disposition: A | Discharge status: Home / Self Care | Payer: Medicare Other | Source: Ambulatory Visit | Attending: Cardiovascular Disease | Admitting: Cardiovascular Disease

## 2018-02-04 DIAGNOSIS — F329 Major depressive disorder, single episode, unspecified: Secondary | ICD-10-CM | POA: Diagnosis not present

## 2018-02-04 DIAGNOSIS — F418 Other specified anxiety disorders: Secondary | ICD-10-CM | POA: Diagnosis not present

## 2018-02-05 ENCOUNTER — Encounter (HOSPITAL_COMMUNITY)
Admission: RE | Admit: 2018-02-05 | Discharge: 2018-02-05 | Disposition: A | Discharge status: Home / Self Care | Payer: Medicare Other | Source: Ambulatory Visit | Attending: Cardiovascular Disease | Admitting: Cardiovascular Disease

## 2018-02-09 ENCOUNTER — Encounter (HOSPITAL_COMMUNITY)
Admission: RE | Admit: 2018-02-09 | Discharge: 2018-02-09 | Disposition: A | Discharge status: Home / Self Care | Payer: Medicare Other | Source: Ambulatory Visit | Attending: Cardiovascular Disease | Admitting: Cardiovascular Disease

## 2018-02-11 ENCOUNTER — Encounter (HOSPITAL_COMMUNITY)
Admission: RE | Admit: 2018-02-11 | Discharge: 2018-02-11 | Disposition: A | Discharge status: Home / Self Care | Payer: Medicare Other | Source: Ambulatory Visit | Attending: Cardiovascular Disease | Admitting: Cardiovascular Disease

## 2018-02-12 ENCOUNTER — Encounter (HOSPITAL_COMMUNITY)
Admission: RE | Admit: 2018-02-12 | Discharge: 2018-02-12 | Disposition: A | Discharge status: Home / Self Care | Payer: Medicare Other | Source: Ambulatory Visit | Attending: Cardiovascular Disease | Admitting: Cardiovascular Disease

## 2018-02-16 ENCOUNTER — Encounter (HOSPITAL_COMMUNITY)
Admission: RE | Admit: 2018-02-16 | Discharge: 2018-02-16 | Disposition: A | Discharge status: Home / Self Care | Payer: Self-pay | Source: Ambulatory Visit | Attending: Cardiovascular Disease | Admitting: Cardiovascular Disease

## 2018-02-18 ENCOUNTER — Encounter (HOSPITAL_COMMUNITY)
Admission: RE | Admit: 2018-02-18 | Discharge: 2018-02-18 | Disposition: A | Discharge status: Home / Self Care | Payer: Medicare Other | Source: Ambulatory Visit | Attending: Cardiovascular Disease | Admitting: Cardiovascular Disease

## 2018-02-18 DIAGNOSIS — I214 Non-ST elevation (NSTEMI) myocardial infarction: Secondary | ICD-10-CM | POA: Insufficient documentation

## 2018-02-19 ENCOUNTER — Encounter (HOSPITAL_COMMUNITY): Payer: Self-pay

## 2018-02-23 ENCOUNTER — Encounter (HOSPITAL_COMMUNITY)
Admission: RE | Admit: 2018-02-23 | Discharge: 2018-02-23 | Disposition: A | Discharge status: Home / Self Care | Payer: Medicare Other | Source: Ambulatory Visit | Attending: Cardiovascular Disease | Admitting: Cardiovascular Disease

## 2018-02-25 ENCOUNTER — Encounter (HOSPITAL_COMMUNITY)
Admission: RE | Admit: 2018-02-25 | Discharge: 2018-02-25 | Disposition: A | Discharge status: Home / Self Care | Payer: Medicare Other | Source: Ambulatory Visit | Attending: Cardiovascular Disease | Admitting: Cardiovascular Disease

## 2018-02-26 ENCOUNTER — Encounter (HOSPITAL_COMMUNITY)
Admission: RE | Admit: 2018-02-26 | Discharge: 2018-02-26 | Disposition: A | Discharge status: Home / Self Care | Payer: Self-pay | Source: Ambulatory Visit | Attending: Cardiovascular Disease | Admitting: Cardiovascular Disease

## 2018-03-02 ENCOUNTER — Encounter (HOSPITAL_COMMUNITY)
Admission: RE | Admit: 2018-03-02 | Discharge: 2018-03-02 | Disposition: A | Discharge status: Home / Self Care | Payer: Medicare Other | Source: Ambulatory Visit | Attending: Cardiovascular Disease | Admitting: Cardiovascular Disease

## 2018-03-02 DIAGNOSIS — F418 Other specified anxiety disorders: Secondary | ICD-10-CM | POA: Diagnosis not present

## 2018-03-02 DIAGNOSIS — F329 Major depressive disorder, single episode, unspecified: Secondary | ICD-10-CM | POA: Diagnosis not present

## 2018-03-04 ENCOUNTER — Encounter (HOSPITAL_COMMUNITY)
Admission: RE | Admit: 2018-03-04 | Discharge: 2018-03-04 | Disposition: A | Discharge status: Home / Self Care | Payer: Medicare Other | Source: Ambulatory Visit | Attending: Cardiovascular Disease | Admitting: Cardiovascular Disease

## 2018-03-05 ENCOUNTER — Encounter (HOSPITAL_COMMUNITY)
Admission: RE | Admit: 2018-03-05 | Discharge: 2018-03-05 | Disposition: A | Discharge status: Home / Self Care | Payer: Medicare Other | Source: Ambulatory Visit | Attending: Cardiovascular Disease | Admitting: Cardiovascular Disease

## 2018-03-09 ENCOUNTER — Encounter (HOSPITAL_COMMUNITY)
Admission: RE | Admit: 2018-03-09 | Discharge: 2018-03-09 | Disposition: A | Discharge status: Home / Self Care | Payer: Medicare Other | Source: Ambulatory Visit | Attending: Cardiovascular Disease | Admitting: Cardiovascular Disease

## 2018-03-11 ENCOUNTER — Encounter (HOSPITAL_COMMUNITY)
Admission: RE | Admit: 2018-03-11 | Discharge: 2018-03-11 | Disposition: A | Discharge status: Home / Self Care | Payer: Medicare Other | Source: Ambulatory Visit | Attending: Cardiovascular Disease | Admitting: Cardiovascular Disease

## 2018-03-12 ENCOUNTER — Encounter (HOSPITAL_COMMUNITY)
Admission: RE | Admit: 2018-03-12 | Discharge: 2018-03-12 | Disposition: A | Discharge status: Home / Self Care | Payer: Medicare Other | Source: Ambulatory Visit | Attending: Cardiovascular Disease | Admitting: Cardiovascular Disease

## 2018-03-16 ENCOUNTER — Encounter (HOSPITAL_COMMUNITY)
Admission: RE | Admit: 2018-03-16 | Discharge: 2018-03-16 | Disposition: A | Discharge status: Home / Self Care | Payer: Medicare Other | Source: Ambulatory Visit | Attending: Cardiovascular Disease | Admitting: Cardiovascular Disease

## 2018-03-18 ENCOUNTER — Encounter (HOSPITAL_COMMUNITY)
Admission: RE | Admit: 2018-03-18 | Discharge: 2018-03-18 | Disposition: A | Discharge status: Home / Self Care | Payer: Medicare Other | Source: Ambulatory Visit | Attending: Cardiovascular Disease | Admitting: Cardiovascular Disease

## 2018-03-18 ENCOUNTER — Other Ambulatory Visit: Payer: Self-pay | Admitting: Cardiovascular Disease

## 2018-03-18 DIAGNOSIS — Z23 Encounter for immunization: Secondary | ICD-10-CM | POA: Diagnosis not present

## 2018-03-19 ENCOUNTER — Encounter (HOSPITAL_COMMUNITY)
Admission: RE | Admit: 2018-03-19 | Discharge: 2018-03-19 | Disposition: A | Discharge status: Home / Self Care | Payer: Medicare Other | Source: Ambulatory Visit | Attending: Cardiovascular Disease | Admitting: Cardiovascular Disease

## 2018-03-23 ENCOUNTER — Encounter (HOSPITAL_COMMUNITY)
Admission: RE | Admit: 2018-03-23 | Discharge: 2018-03-23 | Disposition: A | Discharge status: Home / Self Care | Payer: Medicare Other | Source: Ambulatory Visit | Attending: Cardiovascular Disease | Admitting: Cardiovascular Disease

## 2018-03-23 DIAGNOSIS — L821 Other seborrheic keratosis: Secondary | ICD-10-CM | POA: Diagnosis not present

## 2018-03-23 DIAGNOSIS — I788 Other diseases of capillaries: Secondary | ICD-10-CM | POA: Diagnosis not present

## 2018-03-23 DIAGNOSIS — D2262 Melanocytic nevi of left upper limb, including shoulder: Secondary | ICD-10-CM | POA: Diagnosis not present

## 2018-03-23 DIAGNOSIS — I214 Non-ST elevation (NSTEMI) myocardial infarction: Secondary | ICD-10-CM | POA: Insufficient documentation

## 2018-03-23 DIAGNOSIS — D2261 Melanocytic nevi of right upper limb, including shoulder: Secondary | ICD-10-CM | POA: Diagnosis not present

## 2018-03-24 DIAGNOSIS — F418 Other specified anxiety disorders: Secondary | ICD-10-CM | POA: Diagnosis not present

## 2018-03-24 DIAGNOSIS — F329 Major depressive disorder, single episode, unspecified: Secondary | ICD-10-CM | POA: Diagnosis not present

## 2018-03-25 ENCOUNTER — Encounter (HOSPITAL_COMMUNITY)
Admission: RE | Admit: 2018-03-25 | Discharge: 2018-03-25 | Disposition: A | Discharge status: Home / Self Care | Payer: Medicare Other | Source: Ambulatory Visit | Attending: Cardiovascular Disease | Admitting: Cardiovascular Disease

## 2018-03-26 ENCOUNTER — Encounter (HOSPITAL_COMMUNITY): Payer: Self-pay

## 2018-03-30 ENCOUNTER — Encounter (HOSPITAL_COMMUNITY)
Admission: RE | Admit: 2018-03-30 | Discharge: 2018-03-30 | Disposition: A | Discharge status: Home / Self Care | Payer: Medicare Other | Source: Ambulatory Visit | Attending: Cardiovascular Disease | Admitting: Cardiovascular Disease

## 2018-04-01 ENCOUNTER — Encounter (HOSPITAL_COMMUNITY)
Admission: RE | Admit: 2018-04-01 | Discharge: 2018-04-01 | Disposition: A | Discharge status: Home / Self Care | Payer: Medicare Other | Source: Ambulatory Visit | Attending: Cardiovascular Disease | Admitting: Cardiovascular Disease

## 2018-04-02 ENCOUNTER — Encounter (HOSPITAL_COMMUNITY)
Admission: RE | Admit: 2018-04-02 | Discharge: 2018-04-02 | Disposition: A | Discharge status: Home / Self Care | Payer: Medicare Other | Source: Ambulatory Visit | Attending: Cardiovascular Disease | Admitting: Cardiovascular Disease

## 2018-04-05 ENCOUNTER — Telehealth: Payer: Self-pay | Admitting: Internal Medicine

## 2018-04-05 DIAGNOSIS — R918 Other nonspecific abnormal finding of lung field: Secondary | ICD-10-CM

## 2018-04-05 DIAGNOSIS — R0602 Shortness of breath: Secondary | ICD-10-CM

## 2018-04-05 NOTE — Telephone Encounter (Signed)
Called and spoke with Patient.  She stated that she should need a CT chest this OV.  OV is scheduled 04/29/18 with MR.  She stated that she has had some recent hoarseness, non productive cough, and seasonal allergy.    MR please advise on CT chest

## 2018-04-06 ENCOUNTER — Encounter (HOSPITAL_COMMUNITY)
Admission: RE | Admit: 2018-04-06 | Discharge: 2018-04-06 | Disposition: A | Discharge status: Home / Self Care | Payer: Medicare Other | Source: Ambulatory Visit | Attending: Cardiovascular Disease | Admitting: Cardiovascular Disease

## 2018-04-06 NOTE — Telephone Encounter (Signed)
Spoke with patient, made aware order was placed. Voiced understanding. Nothing further needed at this time.

## 2018-04-06 NOTE — Telephone Encounter (Signed)
Last cT Nov 2017. So yu can get one on same indication as before

## 2018-04-08 ENCOUNTER — Encounter (HOSPITAL_COMMUNITY)
Admission: RE | Admit: 2018-04-08 | Discharge: 2018-04-08 | Disposition: A | Discharge status: Home / Self Care | Payer: Medicare Other | Source: Ambulatory Visit | Attending: Cardiovascular Disease | Admitting: Cardiovascular Disease

## 2018-04-09 ENCOUNTER — Encounter (HOSPITAL_COMMUNITY)
Admission: RE | Admit: 2018-04-09 | Discharge: 2018-04-09 | Disposition: A | Discharge status: Home / Self Care | Payer: Self-pay | Source: Ambulatory Visit | Attending: Cardiovascular Disease | Admitting: Cardiovascular Disease

## 2018-04-13 ENCOUNTER — Encounter (HOSPITAL_COMMUNITY)
Admission: RE | Admit: 2018-04-13 | Discharge: 2018-04-13 | Disposition: A | Discharge status: Home / Self Care | Payer: Medicare Other | Source: Ambulatory Visit | Attending: Cardiovascular Disease | Admitting: Cardiovascular Disease

## 2018-04-15 ENCOUNTER — Encounter (HOSPITAL_COMMUNITY)
Admission: RE | Admit: 2018-04-15 | Discharge: 2018-04-15 | Disposition: A | Discharge status: Home / Self Care | Payer: Medicare Other | Source: Ambulatory Visit | Attending: Cardiovascular Disease | Admitting: Cardiovascular Disease

## 2018-04-16 ENCOUNTER — Other Ambulatory Visit: Payer: Self-pay | Admitting: Endocrinology

## 2018-04-16 ENCOUNTER — Encounter (HOSPITAL_COMMUNITY): Payer: Self-pay

## 2018-04-16 DIAGNOSIS — S2001XA Contusion of right breast, initial encounter: Secondary | ICD-10-CM

## 2018-04-19 DIAGNOSIS — F418 Other specified anxiety disorders: Secondary | ICD-10-CM | POA: Diagnosis not present

## 2018-04-19 DIAGNOSIS — F329 Major depressive disorder, single episode, unspecified: Secondary | ICD-10-CM | POA: Diagnosis not present

## 2018-04-20 ENCOUNTER — Encounter (HOSPITAL_COMMUNITY)
Admission: RE | Admit: 2018-04-20 | Discharge: 2018-04-20 | Disposition: A | Discharge status: Home / Self Care | Payer: Medicare Other | Source: Ambulatory Visit | Attending: Cardiovascular Disease | Admitting: Cardiovascular Disease

## 2018-04-20 DIAGNOSIS — K5010 Crohn's disease of large intestine without complications: Secondary | ICD-10-CM | POA: Diagnosis not present

## 2018-04-20 DIAGNOSIS — R1031 Right lower quadrant pain: Secondary | ICD-10-CM | POA: Diagnosis not present

## 2018-04-20 DIAGNOSIS — I214 Non-ST elevation (NSTEMI) myocardial infarction: Secondary | ICD-10-CM | POA: Insufficient documentation

## 2018-04-20 DIAGNOSIS — Z79899 Other long term (current) drug therapy: Secondary | ICD-10-CM | POA: Diagnosis not present

## 2018-04-21 ENCOUNTER — Ambulatory Visit
Admission: RE | Admit: 2018-04-21 | Discharge: 2018-04-21 | Disposition: A | Discharge status: Home / Self Care | Payer: Medicare Other | Source: Ambulatory Visit | Attending: Endocrinology | Admitting: Endocrinology

## 2018-04-21 DIAGNOSIS — S2001XA Contusion of right breast, initial encounter: Secondary | ICD-10-CM

## 2018-04-21 DIAGNOSIS — R922 Inconclusive mammogram: Secondary | ICD-10-CM | POA: Diagnosis not present

## 2018-04-21 DIAGNOSIS — N6489 Other specified disorders of breast: Secondary | ICD-10-CM | POA: Diagnosis not present

## 2018-04-22 ENCOUNTER — Encounter (HOSPITAL_COMMUNITY)
Admission: RE | Admit: 2018-04-22 | Discharge: 2018-04-22 | Disposition: A | Discharge status: Home / Self Care | Payer: Medicare Other | Source: Ambulatory Visit | Attending: Cardiovascular Disease | Admitting: Cardiovascular Disease

## 2018-04-23 ENCOUNTER — Ambulatory Visit (INDEPENDENT_AMBULATORY_CARE_PROVIDER_SITE_OTHER)
Admission: RE | Admit: 2018-04-23 | Discharge: 2018-04-23 | Disposition: A | Discharge status: Home / Self Care | Payer: Medicare Other | Source: Ambulatory Visit | Attending: Internal Medicine | Admitting: Internal Medicine

## 2018-04-23 ENCOUNTER — Encounter (HOSPITAL_COMMUNITY)
Admission: RE | Admit: 2018-04-23 | Discharge: 2018-04-23 | Disposition: A | Discharge status: Home / Self Care | Payer: Medicare Other | Source: Ambulatory Visit | Attending: Cardiovascular Disease | Admitting: Cardiovascular Disease

## 2018-04-23 DIAGNOSIS — R0602 Shortness of breath: Secondary | ICD-10-CM | POA: Diagnosis not present

## 2018-04-23 DIAGNOSIS — R918 Other nonspecific abnormal finding of lung field: Secondary | ICD-10-CM | POA: Diagnosis not present

## 2018-04-23 DIAGNOSIS — J479 Bronchiectasis, uncomplicated: Secondary | ICD-10-CM | POA: Diagnosis not present

## 2018-04-27 ENCOUNTER — Encounter (HOSPITAL_COMMUNITY)
Admission: RE | Admit: 2018-04-27 | Discharge: 2018-04-27 | Disposition: A | Discharge status: Home / Self Care | Payer: Medicare Other | Source: Ambulatory Visit | Attending: Cardiovascular Disease | Admitting: Cardiovascular Disease

## 2018-04-29 ENCOUNTER — Encounter (HOSPITAL_COMMUNITY)
Admission: RE | Admit: 2018-04-29 | Discharge: 2018-04-29 | Disposition: A | Discharge status: Home / Self Care | Payer: Medicare Other | Source: Ambulatory Visit | Attending: Cardiovascular Disease | Admitting: Cardiovascular Disease

## 2018-04-29 ENCOUNTER — Ambulatory Visit: Payer: Medicare Other | Admitting: Internal Medicine

## 2018-04-30 ENCOUNTER — Encounter (HOSPITAL_COMMUNITY): Payer: Self-pay

## 2018-05-04 ENCOUNTER — Encounter (HOSPITAL_COMMUNITY)
Admission: RE | Admit: 2018-05-04 | Discharge: 2018-05-04 | Disposition: A | Discharge status: Home / Self Care | Payer: Medicare Other | Source: Ambulatory Visit | Attending: Cardiovascular Disease | Admitting: Cardiovascular Disease

## 2018-05-05 ENCOUNTER — Ambulatory Visit: Payer: Medicare Other | Admitting: Internal Medicine

## 2018-05-05 ENCOUNTER — Encounter: Payer: Self-pay | Admitting: Internal Medicine

## 2018-05-05 VITALS — BP 110/60 | HR 52 | Ht 67.5 in | Wt 111.2 lb

## 2018-05-05 DIAGNOSIS — J479 Bronchiectasis, uncomplicated: Secondary | ICD-10-CM | POA: Diagnosis not present

## 2018-05-05 DIAGNOSIS — R918 Other nonspecific abnormal finding of lung field: Secondary | ICD-10-CM

## 2018-05-05 MED ORDER — FLUTTER DEVI: 1.0000 | 0 refills | Status: DC

## 2018-05-05 NOTE — Progress Notes (Signed)
OV 10/06/2014  Chief Complaint  Patient presents with  . Follow-up    Pt stated her cough is infrequent and only coughs every now and then. Pt c/o left sinus pressure, rhinorrhea, mild cough, headache. pt is contributing her s/s to seasonal allergies. Pt denies SOB. Pt states she has chest tightness occassional while lying down.      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 At that time plan was to stop ACE inhibitor and started nasal steroids and to follow up today to see how the cough is doing. However ACE inhibitor has not been stopped. This is apparently due to the reason that I forgot to communicate with Dr. Sherren Mocha about a substitute. She is taking nasal steroids at this point. In any event her cough is not an issue anymore and is largely resolved according to her history. However for the past one week she is complaining of some left facial pain and headache. She also continues to have chronic night sweats.   OV 08/25/2016  Chief Complaint  Patient presents with  . Follow-up    Pt states she feels she is doing well. Pt states she has occassional hoarsness. Pt denies cough, SOB, CP/tightness.     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 In the interim November 2016 and November 2017 she had CT scan of the chest that showed bilateral scattered infiltrates consistent with MAI infection [I personally visualized the CT chest]. Currently in the interim she says she has no dyspnea or cough. She only has occasional hoarseness. This is associated with Lasix. She is off ace inhibitors. She's feeling well. Is no weight loss and no new medical problems. Overall she's had a previous stability with CT chest    OV 05/05/2018  Subjective:  Patient ID: Kirsten Rice, female , DOB: 27-Feb-1946 , age 72  y.o. , MRN: [id number] , ADDRESS: [place] [id number]   05/05/2018 -   Chief Complaint  Patient presents with  . 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 -last seen over 18 months ago.  Since then she is doing well.  Currently asymptomatic.  She had a follow-up CT scan of the chest that shows stability over the years.  She is up-to-date with a flu shot.  She is questioning the need for follow-up.  She wanted to discuss the CT results that showed some mild bronchiectasis in middle lobe and lingula but she is denying any cough or wheezing or shortness of breath.  This is asking if it is okay to do expectant follow-up.  Her daughter who was my patient has relocated to [place] and [place]   IMPRESSION: HRCT 1. 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 2.  Aortic atherosclerosis (ICD10-170.0). 3. Small amount of pericardial fluid is likely physiologic.   Electronically Signed     By: Lorin Picket M.D.   On: 04/23/2018 15:04   ROS - per HPI     has a past medical history of Arthritis, Basal cell carcinoma, Chronic systolic CHF (congestive heart failure) ([place]), Crohn's disease ([place]), Deep vein thrombosis ([place]), Essential hypertension, History of pneumonia, Hyperthyroidism, Mitral valve prolapse,  Myocardial infarct [facility]), Ocular headache, Persistent dry cough, PONV (postoperative nausea and vomiting) (1970's), Raynaud's disease, Squamous carcinoma, Takotsubo cardiomyopathy, and Telangiectasia.   reports that she has never smoked. She has never used smokeless tobacco.  Past Surgical History:  Procedure Laterality Date  . APPENDECTOMY  1971  . BASAL CELL CARCINOMA EXCISION     forehead, back, legs, arms" (01/31/2015)  . CARDIAC CATHETERIZATION  2006; 2007  .  CARDIAC CATHETERIZATION N/A 01/31/2015   Procedure: Left Heart Cath and Coronary Angiography;  Surgeon: Jettie Booze, MD;  Location: [place] [facility];  Service: Cardiovascular;  Laterality: N/A;  . COLONOSCOPY    . COLONOSCOPY  05/11/2012   Procedure: COLONOSCOPY;  Surgeon: Lafayette Dragon, MD;  Location: [facility];  Service: Endoscopy;  Laterality: N/A;  . SHOULDER ARTHROSCOPY W/ ROTATOR CUFF REPAIR Right 1996  . SQUAMOUS CELL CARCINOMA EXCISION Left early 1980's   "eyelid"  . TUBAL LIGATION  1973    Allergies  Allergen Reactions  . Cafergot [Ergotamine-Caffeine] Other (See Comments)    Vagal paralysis  . Compazine [Prochlorperazine Edisylate] Other (See Comments)    Other reaction(s): stroke like sx paralysis of face  . Ergotamine-Caffeine     Other reaction(s): vagal response  . Ergotamine-Caffeine     Other reaction(s): Other (See Comments) Other reaction(s): vagal response  . Prochlorperazine     Other reaction(s): Other (See Comments) Patient states like had a stroke  . Prochlorperazine Edisylate Other (See Comments)    REACTION: extrapyrimadal syndrome  . Tetanus-Diphtheria Toxoids Td Swelling    REACTION: arm swells. Back in the 1970's when made with horse serum. Has had tetanus shot since then without problem.  . Other     Other reaction(s): Other (See Comments) Passed out  . Tetanus Toxoid Adsorbed Swelling    Horse serum back in 1970's. Pt has had tetanus shot since    Immunization History  Administered Date(s) Administered  . Influenza Split 04/20/2014  . Influenza Whole 04/11/2009  . Influenza, High Dose Seasonal PF 02/19/2016  . PPD Test 02/08/2013, 02/07/2014  . Pneumococcal Conjugate-13 10/19/2013    Family History  Problem Relation Age of Onset  . Aortic stenosis Father   . Hypertension Father   . Diabetes Sister   . Hyperthyroidism Sister   . Hypertension Mother   . Diabetes Sister   . Hyperthyroidism Sister   . Hyperthyroidism Sister    . Hyperthyroidism Sister   . Colon cancer Neg Hx   . Breast cancer Neg Hx      Current Outpatient Medications:  .  acetaminophen (TYLENOL) 325 MG tablet, Take 650 mg by mouth every 6 (six) hours as needed for mild pain or headache., Disp: , Rfl:  .  aspirin 81 MG tablet, Take 81 mg by mouth daily.  , Disp: , Rfl:  .  balsalazide (COLAZAL) 750 MG capsule, Take 2,250 mg by mouth 3 (three) times daily. , Disp: , Rfl:  .  benazepril (LOTENSIN) 10 MG tablet, Take 1 tablet (10 mg total) by mouth daily., Disp: 90 tablet, Rfl: 3 .  bifidobacterium infantis (ALIGN) capsule, Take 1 capsule by mouth daily., Disp: , Rfl:  .  Biotin 2500 MCG CAPS, Take 2,500 mcg by mouth daily. , Disp: , Rfl:  .  budesonide (ENTOCORT EC) 3 MG 24 hr capsule, Take 9 mg by mouth daily., Disp: , Rfl:  .  Calcium-Vitamin D 600-200 MG-UNIT per tablet, Take 1 tablet by mouth daily. , Disp: , Rfl:  .  carvedilol (COREG) 3.125 MG tablet, Take 1 tablet (3.125 mg total) by mouth 2 (two) times daily with a meal., Disp: 180 tablet, Rfl: 3 .  clobetasol cream (TEMOVATE) 9.03 %, Apply 1 application topically at bedtime as needed (dry skin on hands). , Disp: , Rfl: 0 .  fluticasone (FLONASE) 50 MCG/ACT nasal spray, USE 2 SPRAYS IN EACH NOSTRIL DAILY AS NEEDED FOR CONGESTION., Disp: 16 g, Rfl: 5 .  furosemide (LASIX) 20 MG tablet, TAKE 1 TABLET(20 MG) BY MOUTH DAILY, Disp: 90 tablet, Rfl: 2 .  nitroGLYCERIN (NITROSTAT) 0.4 MG SL tablet, DISSOLVE 1 TABLET UNDER THE TONGUE EVERY 5 MINUTES FOR UP TO 3 DOSES AS NEEDED FOR CHEST PAIN, Disp: 75 tablet, Rfl: 1 .  potassium chloride SA (K-DUR,KLOR-CON) 20 MEQ tablet, TAKE 1 TABLET(20 MEQ) BY MOUTH DAILY, Disp: 90 tablet, Rfl: 2 .  PRESCRIPTION MEDICATION, Apply 1 application topically daily as needed (rosasea). Finacea Cream, uses PRN, Disp: , Rfl:  .  RESTASIS 0.05 % ophthalmic emulsion, Place 1 drop into both eyes 2 (two) times daily. , Disp: , Rfl: 3 .  vitamin C (ASCORBIC ACID) 500 MG  tablet, Take 500 mg by mouth daily.  , Disp: , Rfl:  .  Respiratory Therapy Supplies (FLUTTER) DEVI, 1 Device by Does not apply route as directed., Disp: 1 each, Rfl: 0      Objective:   Vitals:   05/05/18 0926  BP: 110/60  Pulse: (!) 52  SpO2: 98%  Weight: 111 lb 3.2 oz (50.4 kg)  Height: 5' 7.5" (1.715 m)    Estimated body mass index is 17.16 kg/m as calculated from the following:   Height as of this encounter: 5' 7.5" (1.715 m).   Weight as of this encounter: 111 lb 3.2 oz (50.4 kg).  _0 @  Filed Weights   05/05/18 0926  Weight: 111 lb 3.2 oz (50.4 kg)     Physical Exam  General Appearance:    Alert, cooperative, no distress, appears stated age - yes , Deconditioned looking - no , OBESE  - no, Sitting on Wheelchair -  no  Head:    Normocephalic, without obvious abnormality, atraumatic  Eyes:    PERRL, conjunctiva/corneas clear,  Ears:    Normal TM's and external ear canals, both ears  Nose:   Nares normal, septum midline, mucosa normal, no drainage    or sinus tenderness. OXYGEN ON  - no . Patient is @ ra   Throat:   Lips, mucosa, and tongue normal; teeth and gums normal. Cyanosis on lips - no  Neck:   Supple, symmetrical, trachea midline, no adenopathy;    thyroid:  no enlargement/tenderness/nodules; no carotid   bruit or JVD  Back:     Symmetric, no curvature, ROM normal, no CVA tenderness  Lungs:     Distress - no , Wheeze no, Barrell Chest - no, Purse lip breathing - no, Crackles - no   Chest Wall:    No tenderness or deformity.    Heart:    Regular rate and rhythm, S1 and S2 normal, no rub   or gallop, Murmur - no  Breast Exam:    NOT DONE  Abdomen:     Soft, non-tender, bowel sounds active all four quadrants,    no masses, no organomegaly. Visceral obesity - n  Genitalia:   NOT DONE  Rectal:   NOT DONE  Extremities:   Extremities - normal, Has Cane - no, Clubbing - no, Edema - no  Pulses:  2+ and symmetric all extremities  Skin:   Stigmata of  Connective Tissue Disease - no  Lymph nodes:   Cervical, supraclavicular, and axillary nodes normal  Psychiatric:  Neurologic:   Pleasant - yes, Anxious - no, Flat affect - no  CAm-ICU - neg, Alert and Oriented x 3 - yes, Moves all 4s - yes, Speech - normal, Cognition - intact           Assessment:       ICD-10-CM   1. Bronchiectasis without complication ([place]) A63.0   2. Pulmonary infiltrate R91.8        Plan:     Patient Instructions     ICD-10-CM   1. Bronchiectasis without complication ([place]) Z60.1   2. Pulmonary infiltrate R91.8     ASymptomatic and stable CT stable for years  Plan Flutter valve as needed Return as needed     SIGNATURE    Dr. Brand Males, M.D., F.C.C.P,  Pulmonary and Critical Care Medicine Staff Physician, [place] Director - Interstitial Lung Disease  Program  Pulmonary [place] at [place], [place], [address]  Pager: [phone], If no answer or between  15:00h - 7:00h: call [phone] Telephone: [phone]  10:11 AM 05/05/2018

## 2018-05-05 NOTE — Patient Instructions (Signed)
ICD-10-CM   1. Bronchiectasis without complication ([place]) Y24.1   2. Pulmonary infiltrate R91.8     ASymptomatic and stable CT stable for years  Plan Flutter valve as needed Return as needed

## 2018-05-06 ENCOUNTER — Encounter (HOSPITAL_COMMUNITY)
Admission: RE | Admit: 2018-05-06 | Discharge: 2018-05-06 | Disposition: A | Discharge status: Home / Self Care | Payer: Medicare Other | Source: Ambulatory Visit | Attending: Cardiovascular Disease | Admitting: Cardiovascular Disease

## 2018-05-07 ENCOUNTER — Encounter (HOSPITAL_COMMUNITY): Payer: Self-pay

## 2018-05-11 ENCOUNTER — Encounter (HOSPITAL_COMMUNITY)
Admission: RE | Admit: 2018-05-11 | Discharge: 2018-05-11 | Disposition: A | Discharge status: Home / Self Care | Payer: Medicare Other | Source: Ambulatory Visit | Attending: Cardiovascular Disease | Admitting: Cardiovascular Disease

## 2018-05-13 ENCOUNTER — Encounter (HOSPITAL_COMMUNITY)
Admission: RE | Admit: 2018-05-13 | Discharge: 2018-05-13 | Disposition: A | Discharge status: Home / Self Care | Payer: Medicare Other | Source: Ambulatory Visit | Attending: Cardiovascular Disease | Admitting: Cardiovascular Disease

## 2018-05-14 ENCOUNTER — Encounter (HOSPITAL_COMMUNITY)
Admission: RE | Admit: 2018-05-14 | Discharge: 2018-05-14 | Disposition: A | Discharge status: Home / Self Care | Payer: Medicare Other | Source: Ambulatory Visit | Attending: Cardiovascular Disease | Admitting: Cardiovascular Disease

## 2018-05-14 DIAGNOSIS — F329 Major depressive disorder, single episode, unspecified: Secondary | ICD-10-CM | POA: Diagnosis not present

## 2018-05-14 DIAGNOSIS — F418 Other specified anxiety disorders: Secondary | ICD-10-CM | POA: Diagnosis not present

## 2018-05-17 DIAGNOSIS — J479 Bronchiectasis, uncomplicated: Secondary | ICD-10-CM | POA: Diagnosis not present

## 2018-05-17 DIAGNOSIS — E7849 Other hyperlipidemia: Secondary | ICD-10-CM | POA: Diagnosis not present

## 2018-05-17 DIAGNOSIS — I10 Essential (primary) hypertension: Secondary | ICD-10-CM | POA: Diagnosis not present

## 2018-05-17 DIAGNOSIS — M859 Disorder of bone density and structure, unspecified: Secondary | ICD-10-CM | POA: Diagnosis not present

## 2018-05-18 ENCOUNTER — Encounter (HOSPITAL_COMMUNITY)
Admission: RE | Admit: 2018-05-18 | Discharge: 2018-05-18 | Disposition: A | Discharge status: Home / Self Care | Payer: Medicare Other | Source: Ambulatory Visit | Attending: Cardiovascular Disease | Admitting: Cardiovascular Disease

## 2018-05-20 ENCOUNTER — Encounter (HOSPITAL_COMMUNITY)
Admission: RE | Admit: 2018-05-20 | Discharge: 2018-05-20 | Disposition: A | Discharge status: Home / Self Care | Payer: Medicare Other | Source: Ambulatory Visit | Attending: Cardiovascular Disease | Admitting: Cardiovascular Disease

## 2018-05-21 ENCOUNTER — Encounter (HOSPITAL_COMMUNITY)
Admission: RE | Admit: 2018-05-21 | Discharge: 2018-05-21 | Disposition: A | Discharge status: Home / Self Care | Payer: Self-pay | Source: Ambulatory Visit | Attending: Cardiovascular Disease | Admitting: Cardiovascular Disease

## 2018-05-21 DIAGNOSIS — I214 Non-ST elevation (NSTEMI) myocardial infarction: Secondary | ICD-10-CM | POA: Insufficient documentation

## 2018-05-25 ENCOUNTER — Encounter (HOSPITAL_COMMUNITY)
Admission: RE | Admit: 2018-05-25 | Discharge: 2018-05-25 | Disposition: A | Discharge status: Home / Self Care | Payer: Self-pay | Source: Ambulatory Visit | Attending: Cardiovascular Disease | Admitting: Cardiovascular Disease

## 2018-05-27 ENCOUNTER — Encounter (HOSPITAL_COMMUNITY)
Admission: RE | Admit: 2018-05-27 | Discharge: 2018-05-27 | Disposition: A | Discharge status: Home / Self Care | Payer: Self-pay | Source: Ambulatory Visit | Attending: Cardiovascular Disease | Admitting: Cardiovascular Disease

## 2018-05-28 ENCOUNTER — Encounter (HOSPITAL_COMMUNITY): Payer: Self-pay

## 2018-06-01 ENCOUNTER — Encounter (HOSPITAL_COMMUNITY): Payer: Self-pay

## 2018-06-03 ENCOUNTER — Encounter (HOSPITAL_COMMUNITY)
Admission: RE | Admit: 2018-06-03 | Discharge: 2018-06-03 | Disposition: A | Discharge status: Home / Self Care | Payer: Self-pay | Source: Ambulatory Visit | Attending: Cardiovascular Disease | Admitting: Cardiovascular Disease

## 2018-06-04 ENCOUNTER — Encounter (HOSPITAL_COMMUNITY)
Admission: RE | Admit: 2018-06-04 | Discharge: 2018-06-04 | Disposition: A | Discharge status: Home / Self Care | Payer: Self-pay | Source: Ambulatory Visit | Attending: Cardiovascular Disease | Admitting: Cardiovascular Disease

## 2018-06-08 ENCOUNTER — Encounter (HOSPITAL_COMMUNITY)
Admission: RE | Admit: 2018-06-08 | Discharge: 2018-06-08 | Disposition: A | Discharge status: Home / Self Care | Payer: Self-pay | Source: Ambulatory Visit | Attending: Cardiovascular Disease | Admitting: Cardiovascular Disease

## 2018-06-10 ENCOUNTER — Encounter (HOSPITAL_COMMUNITY)
Admission: RE | Admit: 2018-06-10 | Discharge: 2018-06-10 | Disposition: A | Discharge status: Home / Self Care | Payer: Self-pay | Source: Ambulatory Visit | Attending: Cardiovascular Disease | Admitting: Cardiovascular Disease

## 2018-06-11 ENCOUNTER — Encounter (HOSPITAL_COMMUNITY)
Admission: RE | Admit: 2018-06-11 | Discharge: 2018-06-11 | Disposition: A | Discharge status: Home / Self Care | Payer: Self-pay | Source: Ambulatory Visit | Attending: Cardiovascular Disease | Admitting: Cardiovascular Disease

## 2018-06-15 ENCOUNTER — Encounter (HOSPITAL_COMMUNITY)
Admission: RE | Admit: 2018-06-15 | Discharge: 2018-06-15 | Disposition: A | Discharge status: Home / Self Care | Payer: Self-pay | Source: Ambulatory Visit | Attending: Cardiovascular Disease | Admitting: Cardiovascular Disease

## 2018-06-22 ENCOUNTER — Encounter (HOSPITAL_COMMUNITY)
Admission: RE | Admit: 2018-06-22 | Discharge: 2018-06-22 | Disposition: A | Discharge status: Home / Self Care | Payer: Self-pay | Source: Ambulatory Visit | Attending: Cardiovascular Disease | Admitting: Cardiovascular Disease

## 2018-06-22 DIAGNOSIS — Z7982 Long term (current) use of aspirin: Secondary | ICD-10-CM | POA: Insufficient documentation

## 2018-06-22 DIAGNOSIS — I5181 Takotsubo syndrome: Secondary | ICD-10-CM | POA: Insufficient documentation

## 2018-06-22 DIAGNOSIS — Z79899 Other long term (current) drug therapy: Secondary | ICD-10-CM | POA: Insufficient documentation

## 2018-06-22 DIAGNOSIS — I252 Old myocardial infarction: Secondary | ICD-10-CM | POA: Insufficient documentation

## 2018-06-22 DIAGNOSIS — Z9889 Other specified postprocedural states: Secondary | ICD-10-CM | POA: Insufficient documentation

## 2018-06-24 ENCOUNTER — Encounter (HOSPITAL_COMMUNITY)
Admission: RE | Admit: 2018-06-24 | Discharge: 2018-06-24 | Disposition: A | Discharge status: Home / Self Care | Payer: Self-pay | Source: Ambulatory Visit | Attending: Cardiovascular Disease | Admitting: Cardiovascular Disease

## 2018-06-25 ENCOUNTER — Encounter (HOSPITAL_COMMUNITY)
Admission: RE | Admit: 2018-06-25 | Discharge: 2018-06-25 | Disposition: A | Discharge status: Home / Self Care | Payer: Self-pay | Source: Ambulatory Visit | Attending: Cardiovascular Disease | Admitting: Cardiovascular Disease

## 2018-06-29 ENCOUNTER — Encounter (HOSPITAL_COMMUNITY)
Admission: RE | Admit: 2018-06-29 | Discharge: 2018-06-29 | Disposition: A | Discharge status: Home / Self Care | Payer: Self-pay | Source: Ambulatory Visit | Attending: Cardiovascular Disease | Admitting: Cardiovascular Disease

## 2018-07-01 ENCOUNTER — Encounter (HOSPITAL_COMMUNITY)
Admission: RE | Admit: 2018-07-01 | Discharge: 2018-07-01 | Disposition: A | Discharge status: Home / Self Care | Payer: Self-pay | Source: Ambulatory Visit | Attending: Cardiovascular Disease | Admitting: Cardiovascular Disease

## 2018-07-02 ENCOUNTER — Encounter (HOSPITAL_COMMUNITY): Payer: Self-pay

## 2018-07-06 ENCOUNTER — Encounter (HOSPITAL_COMMUNITY)
Admission: RE | Admit: 2018-07-06 | Discharge: 2018-07-06 | Disposition: A | Discharge status: Home / Self Care | Payer: Self-pay | Source: Ambulatory Visit | Attending: Cardiovascular Disease | Admitting: Cardiovascular Disease

## 2018-07-08 ENCOUNTER — Encounter (HOSPITAL_COMMUNITY): Payer: Self-pay

## 2018-07-09 ENCOUNTER — Encounter (HOSPITAL_COMMUNITY): Payer: Self-pay

## 2018-07-13 ENCOUNTER — Encounter (HOSPITAL_COMMUNITY)
Admission: RE | Admit: 2018-07-13 | Discharge: 2018-07-13 | Disposition: A | Discharge status: Home / Self Care | Payer: Self-pay | Source: Ambulatory Visit | Attending: Cardiovascular Disease | Admitting: Cardiovascular Disease

## 2018-07-15 ENCOUNTER — Encounter (HOSPITAL_COMMUNITY)
Admission: RE | Admit: 2018-07-15 | Discharge: 2018-07-15 | Disposition: A | Discharge status: Home / Self Care | Payer: Self-pay | Source: Ambulatory Visit | Attending: Cardiovascular Disease | Admitting: Cardiovascular Disease

## 2018-07-16 ENCOUNTER — Encounter (HOSPITAL_COMMUNITY)
Admission: RE | Admit: 2018-07-16 | Discharge: 2018-07-16 | Disposition: A | Discharge status: Home / Self Care | Payer: Self-pay | Source: Ambulatory Visit | Attending: Cardiovascular Disease | Admitting: Cardiovascular Disease

## 2018-07-19 DIAGNOSIS — D485 Neoplasm of uncertain behavior of skin: Secondary | ICD-10-CM | POA: Diagnosis not present

## 2018-07-20 ENCOUNTER — Encounter (HOSPITAL_COMMUNITY)
Admission: RE | Admit: 2018-07-20 | Discharge: 2018-07-20 | Disposition: A | Discharge status: Home / Self Care | Payer: Self-pay | Source: Ambulatory Visit | Attending: Cardiovascular Disease | Admitting: Cardiovascular Disease

## 2018-07-22 ENCOUNTER — Encounter (HOSPITAL_COMMUNITY)
Admission: RE | Admit: 2018-07-22 | Discharge: 2018-07-22 | Disposition: A | Discharge status: Home / Self Care | Payer: Medicare Other | Source: Ambulatory Visit | Attending: Cardiovascular Disease | Admitting: Cardiovascular Disease

## 2018-07-22 DIAGNOSIS — I252 Old myocardial infarction: Secondary | ICD-10-CM | POA: Insufficient documentation

## 2018-07-22 DIAGNOSIS — Z9889 Other specified postprocedural states: Secondary | ICD-10-CM | POA: Insufficient documentation

## 2018-07-22 DIAGNOSIS — I5181 Takotsubo syndrome: Secondary | ICD-10-CM | POA: Insufficient documentation

## 2018-07-22 DIAGNOSIS — Z79899 Other long term (current) drug therapy: Secondary | ICD-10-CM | POA: Insufficient documentation

## 2018-07-22 DIAGNOSIS — Z7982 Long term (current) use of aspirin: Secondary | ICD-10-CM | POA: Insufficient documentation

## 2018-07-23 ENCOUNTER — Encounter (HOSPITAL_COMMUNITY)
Admission: RE | Admit: 2018-07-23 | Discharge: 2018-07-23 | Disposition: A | Discharge status: Home / Self Care | Payer: Medicare Other | Source: Ambulatory Visit | Attending: Cardiovascular Disease | Admitting: Cardiovascular Disease

## 2018-07-27 ENCOUNTER — Encounter (HOSPITAL_COMMUNITY)
Admission: RE | Admit: 2018-07-27 | Discharge: 2018-07-27 | Disposition: A | Discharge status: Home / Self Care | Payer: Medicare Other | Source: Ambulatory Visit | Attending: Cardiovascular Disease | Admitting: Cardiovascular Disease

## 2018-07-29 ENCOUNTER — Encounter (HOSPITAL_COMMUNITY)
Admission: RE | Admit: 2018-07-29 | Discharge: 2018-07-29 | Disposition: A | Discharge status: Home / Self Care | Payer: Medicare Other | Source: Ambulatory Visit | Attending: Cardiovascular Disease | Admitting: Cardiovascular Disease

## 2018-07-30 ENCOUNTER — Encounter (HOSPITAL_COMMUNITY)
Admission: RE | Admit: 2018-07-30 | Discharge: 2018-07-30 | Disposition: A | Discharge status: Home / Self Care | Payer: Medicare Other | Source: Ambulatory Visit | Attending: Cardiovascular Disease | Admitting: Cardiovascular Disease

## 2018-08-03 ENCOUNTER — Encounter (HOSPITAL_COMMUNITY): Payer: Self-pay

## 2018-08-03 DIAGNOSIS — F418 Other specified anxiety disorders: Secondary | ICD-10-CM | POA: Diagnosis not present

## 2018-08-03 DIAGNOSIS — F329 Major depressive disorder, single episode, unspecified: Secondary | ICD-10-CM | POA: Diagnosis not present

## 2018-08-05 ENCOUNTER — Encounter (HOSPITAL_COMMUNITY)
Admission: RE | Admit: 2018-08-05 | Discharge: 2018-08-05 | Disposition: A | Discharge status: Home / Self Care | Payer: Medicare Other | Source: Ambulatory Visit | Attending: Cardiovascular Disease | Admitting: Cardiovascular Disease

## 2018-08-06 ENCOUNTER — Encounter (HOSPITAL_COMMUNITY)
Admission: RE | Admit: 2018-08-06 | Discharge: 2018-08-06 | Disposition: A | Discharge status: Home / Self Care | Payer: Medicare Other | Source: Ambulatory Visit | Attending: Cardiovascular Disease | Admitting: Cardiovascular Disease

## 2018-08-10 ENCOUNTER — Encounter (HOSPITAL_COMMUNITY)
Admission: RE | Admit: 2018-08-10 | Discharge: 2018-08-10 | Disposition: A | Discharge status: Home / Self Care | Payer: Medicare Other | Source: Ambulatory Visit | Attending: Cardiovascular Disease | Admitting: Cardiovascular Disease

## 2018-08-12 ENCOUNTER — Encounter (HOSPITAL_COMMUNITY)
Admission: RE | Admit: 2018-08-12 | Discharge: 2018-08-12 | Disposition: A | Discharge status: Home / Self Care | Payer: Medicare Other | Source: Ambulatory Visit | Attending: Cardiovascular Disease | Admitting: Cardiovascular Disease

## 2018-08-13 ENCOUNTER — Encounter (HOSPITAL_COMMUNITY): Payer: Self-pay

## 2018-08-17 ENCOUNTER — Encounter (HOSPITAL_COMMUNITY): Payer: Self-pay

## 2018-08-19 ENCOUNTER — Encounter (HOSPITAL_COMMUNITY): Payer: Self-pay

## 2018-08-20 ENCOUNTER — Encounter (HOSPITAL_COMMUNITY): Payer: Self-pay

## 2018-08-22 ENCOUNTER — Other Ambulatory Visit: Payer: Self-pay | Admitting: Cardiovascular Disease

## 2018-08-24 ENCOUNTER — Encounter (HOSPITAL_COMMUNITY): Payer: Medicare Other

## 2018-08-24 DIAGNOSIS — Z7982 Long term (current) use of aspirin: Secondary | ICD-10-CM | POA: Insufficient documentation

## 2018-08-24 DIAGNOSIS — F329 Major depressive disorder, single episode, unspecified: Secondary | ICD-10-CM | POA: Diagnosis not present

## 2018-08-24 DIAGNOSIS — Z79899 Other long term (current) drug therapy: Secondary | ICD-10-CM | POA: Insufficient documentation

## 2018-08-24 DIAGNOSIS — I5181 Takotsubo syndrome: Secondary | ICD-10-CM | POA: Insufficient documentation

## 2018-08-24 DIAGNOSIS — Z9889 Other specified postprocedural states: Secondary | ICD-10-CM | POA: Insufficient documentation

## 2018-08-24 DIAGNOSIS — I252 Old myocardial infarction: Secondary | ICD-10-CM | POA: Insufficient documentation

## 2018-08-24 DIAGNOSIS — F418 Other specified anxiety disorders: Secondary | ICD-10-CM | POA: Diagnosis not present

## 2018-08-26 ENCOUNTER — Encounter (HOSPITAL_COMMUNITY): Payer: Self-pay

## 2018-08-27 ENCOUNTER — Encounter (HOSPITAL_COMMUNITY): Payer: Self-pay

## 2018-08-31 ENCOUNTER — Encounter (HOSPITAL_COMMUNITY): Payer: Self-pay

## 2018-09-02 ENCOUNTER — Encounter (HOSPITAL_COMMUNITY): Payer: Self-pay

## 2018-09-03 ENCOUNTER — Encounter (HOSPITAL_COMMUNITY): Payer: Self-pay

## 2018-09-07 ENCOUNTER — Encounter (HOSPITAL_COMMUNITY)
Admission: RE | Admit: 2018-09-07 | Discharge: 2018-09-07 | Disposition: A | Discharge status: Home / Self Care | Payer: Medicare Other | Source: Ambulatory Visit | Attending: Cardiovascular Disease | Admitting: Cardiovascular Disease

## 2018-09-09 ENCOUNTER — Encounter (HOSPITAL_COMMUNITY)
Admission: RE | Admit: 2018-09-09 | Discharge: 2018-09-09 | Disposition: A | Discharge status: Home / Self Care | Payer: Medicare Other | Source: Ambulatory Visit | Attending: Cardiovascular Disease | Admitting: Cardiovascular Disease

## 2018-09-10 ENCOUNTER — Encounter (HOSPITAL_COMMUNITY): Payer: Self-pay

## 2018-09-10 DIAGNOSIS — F329 Major depressive disorder, single episode, unspecified: Secondary | ICD-10-CM | POA: Diagnosis not present

## 2018-09-10 DIAGNOSIS — F418 Other specified anxiety disorders: Secondary | ICD-10-CM | POA: Diagnosis not present

## 2018-09-10 DIAGNOSIS — M859 Disorder of bone density and structure, unspecified: Secondary | ICD-10-CM | POA: Diagnosis not present

## 2018-09-14 ENCOUNTER — Encounter (HOSPITAL_COMMUNITY)
Admission: RE | Admit: 2018-09-14 | Discharge: 2018-09-14 | Disposition: A | Discharge status: Home / Self Care | Payer: Medicare Other | Source: Ambulatory Visit | Attending: Cardiovascular Disease | Admitting: Cardiovascular Disease

## 2018-09-16 ENCOUNTER — Encounter (HOSPITAL_COMMUNITY)
Admission: RE | Admit: 2018-09-16 | Discharge: 2018-09-16 | Disposition: A | Discharge status: Home / Self Care | Payer: Medicare Other | Source: Ambulatory Visit | Attending: Cardiovascular Disease | Admitting: Cardiovascular Disease

## 2018-09-17 ENCOUNTER — Encounter (HOSPITAL_COMMUNITY)
Admission: RE | Admit: 2018-09-17 | Discharge: 2018-09-17 | Disposition: A | Discharge status: Home / Self Care | Payer: Medicare Other | Source: Ambulatory Visit | Attending: Cardiovascular Disease | Admitting: Cardiovascular Disease

## 2018-09-21 ENCOUNTER — Encounter (HOSPITAL_COMMUNITY)
Admission: RE | Admit: 2018-09-21 | Discharge: 2018-09-21 | Disposition: A | Discharge status: Home / Self Care | Payer: Medicare Other | Source: Ambulatory Visit | Attending: Cardiovascular Disease | Admitting: Cardiovascular Disease

## 2018-09-21 DIAGNOSIS — I252 Old myocardial infarction: Secondary | ICD-10-CM | POA: Insufficient documentation

## 2018-09-21 DIAGNOSIS — I5181 Takotsubo syndrome: Secondary | ICD-10-CM | POA: Insufficient documentation

## 2018-09-21 DIAGNOSIS — K5010 Crohn's disease of large intestine without complications: Secondary | ICD-10-CM | POA: Diagnosis not present

## 2018-09-21 DIAGNOSIS — Z79899 Other long term (current) drug therapy: Secondary | ICD-10-CM | POA: Insufficient documentation

## 2018-09-21 DIAGNOSIS — Z9889 Other specified postprocedural states: Secondary | ICD-10-CM | POA: Insufficient documentation

## 2018-09-21 DIAGNOSIS — Z7982 Long term (current) use of aspirin: Secondary | ICD-10-CM | POA: Insufficient documentation

## 2018-09-23 ENCOUNTER — Encounter (HOSPITAL_COMMUNITY)
Admission: RE | Admit: 2018-09-23 | Discharge: 2018-09-23 | Disposition: A | Discharge status: Home / Self Care | Payer: Medicare Other | Source: Ambulatory Visit | Attending: Cardiovascular Disease | Admitting: Cardiovascular Disease

## 2018-09-24 ENCOUNTER — Encounter (HOSPITAL_COMMUNITY)
Admission: RE | Admit: 2018-09-24 | Discharge: 2018-09-24 | Disposition: A | Discharge status: Home / Self Care | Payer: Medicare Other | Source: Ambulatory Visit | Attending: Cardiovascular Disease | Admitting: Cardiovascular Disease

## 2018-09-24 DIAGNOSIS — F329 Major depressive disorder, single episode, unspecified: Secondary | ICD-10-CM | POA: Diagnosis not present

## 2018-09-24 DIAGNOSIS — F418 Other specified anxiety disorders: Secondary | ICD-10-CM | POA: Diagnosis not present

## 2018-09-28 ENCOUNTER — Encounter (HOSPITAL_COMMUNITY): Payer: Self-pay

## 2018-09-30 ENCOUNTER — Other Ambulatory Visit: Payer: Self-pay

## 2018-09-30 ENCOUNTER — Encounter (HOSPITAL_COMMUNITY)
Admission: RE | Admit: 2018-09-30 | Discharge: 2018-09-30 | Disposition: A | Discharge status: Home / Self Care | Payer: Medicare Other | Source: Ambulatory Visit | Attending: Cardiovascular Disease | Admitting: Cardiovascular Disease

## 2018-10-01 ENCOUNTER — Encounter (HOSPITAL_COMMUNITY): Payer: Self-pay

## 2018-10-04 ENCOUNTER — Telehealth (HOSPITAL_COMMUNITY): Payer: Self-pay | Admitting: Cardiac Rehabilitation

## 2018-10-04 NOTE — Telephone Encounter (Signed)
Phone call to patient to notify of CR Phase II departmental closing for 2 weeks.  Pt verbalized understanding.  Joann Rion, RN, BSN Cardiac Pulmonary Rehab  

## 2018-10-05 ENCOUNTER — Encounter (HOSPITAL_COMMUNITY): Payer: Self-pay

## 2018-10-07 ENCOUNTER — Encounter (HOSPITAL_COMMUNITY): Payer: Self-pay

## 2018-10-08 ENCOUNTER — Encounter (HOSPITAL_COMMUNITY): Payer: Self-pay

## 2018-10-11 ENCOUNTER — Telehealth (HOSPITAL_COMMUNITY): Payer: Self-pay | Admitting: *Deleted

## 2018-10-11 DIAGNOSIS — J019 Acute sinusitis, unspecified: Secondary | ICD-10-CM | POA: Diagnosis not present

## 2018-10-11 DIAGNOSIS — I10 Essential (primary) hypertension: Secondary | ICD-10-CM | POA: Diagnosis not present

## 2018-10-11 DIAGNOSIS — I2510 Atherosclerotic heart disease of native coronary artery without angina pectoris: Secondary | ICD-10-CM | POA: Diagnosis not present

## 2018-10-11 DIAGNOSIS — R05 Cough: Secondary | ICD-10-CM | POA: Diagnosis not present

## 2018-10-12 ENCOUNTER — Encounter (HOSPITAL_COMMUNITY): Payer: Self-pay

## 2018-10-12 ENCOUNTER — Other Ambulatory Visit: Payer: Self-pay | Admitting: Cardiovascular Disease

## 2018-10-14 ENCOUNTER — Other Ambulatory Visit: Payer: Self-pay | Admitting: Cardiovascular Disease

## 2018-10-14 ENCOUNTER — Encounter (HOSPITAL_COMMUNITY): Payer: Self-pay

## 2018-10-15 ENCOUNTER — Encounter (HOSPITAL_COMMUNITY): Payer: Self-pay

## 2018-10-19 ENCOUNTER — Encounter (HOSPITAL_COMMUNITY): Payer: Self-pay

## 2018-10-21 ENCOUNTER — Encounter (HOSPITAL_COMMUNITY): Payer: Self-pay

## 2018-10-22 ENCOUNTER — Encounter (HOSPITAL_COMMUNITY): Payer: Self-pay

## 2018-10-26 ENCOUNTER — Encounter (HOSPITAL_COMMUNITY): Payer: Self-pay

## 2018-10-28 ENCOUNTER — Encounter (HOSPITAL_COMMUNITY): Payer: Self-pay

## 2018-10-29 ENCOUNTER — Encounter (HOSPITAL_COMMUNITY): Payer: Self-pay

## 2018-11-02 ENCOUNTER — Encounter (HOSPITAL_COMMUNITY): Payer: Self-pay

## 2018-11-03 ENCOUNTER — Telehealth: Payer: Self-pay

## 2018-11-03 NOTE — Telephone Encounter (Signed)
Left message for patient to call back regarding appointment with Pecolia Ades, NP on 4/28. Call placed due to restrictions enacted for Covid 19.

## 2018-11-04 ENCOUNTER — Encounter (HOSPITAL_COMMUNITY): Payer: Self-pay

## 2018-11-05 ENCOUNTER — Encounter (HOSPITAL_COMMUNITY): Payer: Self-pay

## 2018-11-09 ENCOUNTER — Encounter (HOSPITAL_COMMUNITY): Payer: Self-pay

## 2018-11-11 ENCOUNTER — Encounter (HOSPITAL_COMMUNITY): Payer: Self-pay

## 2018-11-12 ENCOUNTER — Encounter (HOSPITAL_COMMUNITY): Payer: Self-pay

## 2018-11-15 ENCOUNTER — Telehealth: Payer: Self-pay | Admitting: Cardiology

## 2018-11-15 ENCOUNTER — Telehealth: Payer: Self-pay

## 2018-11-15 DIAGNOSIS — K5090 Crohn's disease, unspecified, without complications: Secondary | ICD-10-CM | POA: Diagnosis not present

## 2018-11-15 DIAGNOSIS — J479 Bronchiectasis, uncomplicated: Secondary | ICD-10-CM | POA: Diagnosis not present

## 2018-11-15 DIAGNOSIS — I5181 Takotsubo syndrome: Secondary | ICD-10-CM | POA: Diagnosis not present

## 2018-11-15 DIAGNOSIS — F4320 Adjustment disorder, unspecified: Secondary | ICD-10-CM | POA: Diagnosis not present

## 2018-11-15 NOTE — Telephone Encounter (Signed)
Called pt. 3xs to discuss visit type no answer will try again. Before 5pm

## 2018-11-15 NOTE — Telephone Encounter (Signed)
Spoke with pt. About visit on 11/16/2018. She would like a virtual visit. Did a trial doximity call with her. Trial was successful.

## 2018-11-15 NOTE — Telephone Encounter (Signed)
Follow Up:; ° ° °Returning your call. °

## 2018-11-15 NOTE — Progress Notes (Signed)
Virtual Visit via Video Note   This visit type was conducted due to national recommendations for restrictions regarding the COVID-19 Pandemic (e.g. social distancing) in an effort to limit this patient's exposure and mitigate transmission in our community.  Due to her co-morbid illnesses, this patient is at least at moderate risk for complications without adequate follow up.  This format is felt to be most appropriate for this patient at this time.  All issues noted in this document were discussed and addressed.  A limited physical exam was performed with this format.  Please refer to the patient's chart for her consent to telehealth for [facility].   Evaluation Performed:  Follow-up visit  Date:  11/16/2018   ID:  Kirsten Rice, DOB 06-Feb-1946, MRN [id number]  Patient Location: Home Provider Location: Home  PCP:  Reynold Bowen, MD  Cardiologist:  Sherren Mocha, MD  Electrophysiologist:  None   Chief Complaint:  Follow up of Takotsubo cardiomyopathy  History of Present Illness:    Kirsten Rice is a 73 y.o. female with a Takotsubo cardiomyopathy that initially occurred in 2007 with recurrence in 2016. She had normal coronary arteries by cath in 2016 at which time LVEF was reduced at 25-35% in typical pattern of Takotsubo CM. Follow up echo showed normalization of LV function with ef 60%. She also has Raynaud's syndrome which was worsened by carvedilol so it was discontinued by Dr.Cooper.   Pt is being seen today for annual follow up. She lost her husband ~ 7 weeks ago from alzheimer's and she is lonely with the Covid social distancing. She has no family here but has a friends and a daughter in [place] that she FaceTimes with.   Her BP had been up when caring for her husband so her carvedilol and benazepril were increased by her PCP. Her BP is much better now. She had been going to cardiac rehab since 2007, but it is closed now due to [place] so she is walking everyday ~2 miles or 30  minutes. She denies any CP/pressure, shortness of breath, orthopnea, PND, edema, lightheadedness or syncope. Occ gets puffy ankles at end of day but usually no edema.   She had a resp illness in March and was given flonase and inhaler and she improved.   She says that her main objective for this visit is to have her meds refilled.   She is not currently having issues with Raynaud's given the continuation of beta blocker.  She is usually OK in the warm weather, and more bothersome in the winter.   She reports all labs were normal at PCP in 05/2018. Lipids were not done and she plans to have it done with her next labs in the fall.   The patient does not have symptoms concerning for COVID-19 infection (fever, chills, cough, or new shortness of breath).    Past Medical History:  Diagnosis Date   Arthritis    "probably a little bit in my knees and hips" (01/31/2015)   Basal cell carcinoma    Chronic systolic CHF (congestive heart failure) ([place])    a. 01/2015 Echo: EF 25-35% in setting of takotsubo cardiomyopathy.   Crohn's disease ([place])    Deep vein thrombosis ([place])    pt denies: 04/08/13   Essential hypertension    History of pneumonia    "2-3 times" (01/31/2015)   Hyperthyroidism    pt denies:  04/08/13   Mitral valve prolapse    a. 01/2015 Echo: Ef 25-35%, triv MR.  Myocardial infarct [facility])    Ocular headache    "weekly" (01/31/2015)   Persistent dry cough    PONV (postoperative nausea and vomiting) 1970's   "when I had my appendix out"   Raynaud's disease    Squamous carcinoma    Takotsubo cardiomyopathy    a. 2007 Cath: nl cors, apical ballooning w/ subsequent recovery of LV fxn;  b. 01/2015 NSTEMI/Cath: nl cors, EF 25-25% w/ apical ballooning;  c. 01/2015 echo: EF 30%, septal, apical, mid anterior, inf HK, mildly dil LA/RA.   Telangiectasia    Past Surgical History:  Procedure Laterality Date   APPENDECTOMY  1971   BASAL CELL CARCINOMA EXCISION     forehead,  back, legs, arms" (01/31/2015)   CARDIAC CATHETERIZATION  2006; 2007   CARDIAC CATHETERIZATION N/A 01/31/2015   Procedure: Left Heart Cath and Coronary Angiography;  Surgeon: Jettie Booze, MD;  Location: [place] [facility];  Service: Cardiovascular;  Laterality: N/A;   COLONOSCOPY     COLONOSCOPY  05/11/2012   Procedure: COLONOSCOPY;  Surgeon: Lafayette Dragon, MD;  [facility];  Service: Endoscopy;  Laterality: N/A;   SHOULDER ARTHROSCOPY W/ ROTATOR CUFF REPAIR Right 1996   SQUAMOUS CELL CARCINOMA EXCISION Left early 1980's   "eyelid"   TUBAL LIGATION  1973     Current Meds  Medication Sig   acetaminophen (TYLENOL) 325 MG tablet Take 650 mg by mouth every 6 (six) hours as needed for mild pain or headache.   aspirin 81 MG tablet Take 81 mg by mouth daily.     balsalazide (COLAZAL) 750 MG capsule Take 2,250 mg by mouth 3 (three) times daily.    benazepril (LOTENSIN) 10 MG tablet Take 1 tablet (10 mg total) by mouth daily.   bifidobacterium infantis (ALIGN) capsule Take 1 capsule by mouth daily.   Biotin 2500 MCG CAPS Take 2,500 mcg by mouth daily.    budesonide (ENTOCORT EC) 3 MG 24 hr capsule Take 9 mg by mouth daily.   Calcium-Vitamin D 600-200 MG-UNIT per tablet Take 1 tablet by mouth daily.    carvedilol (COREG) 6.25 MG tablet TAKE 1 TABLET BY MOUTH TWICE DAILY WITH A MEAL. DOSE INCREASE   clobetasol cream (TEMOVATE) 4.09 % Apply 1 application topically at bedtime as needed (dry skin on hands).    fluticasone (FLONASE) 50 MCG/ACT nasal spray USE 2 SPRAYS IN EACH NOSTRIL DAILY AS NEEDED FOR CONGESTION.   furosemide (LASIX) 20 MG tablet TAKE 1 TABLET(20 MG) BY MOUTH DAILY   nitroGLYCERIN (NITROSTAT) 0.4 MG SL tablet DISSOLVE 1 TABLET UNDER THE TONGUE EVERY 5 MINUTES FOR UP TO 3 DOSES AS NEEDED FOR CHEST PAIN   potassium chloride SA (K-DUR) 20 MEQ tablet TAKE 1 TABLET(20 MEQ) BY MOUTH DAILY   PRESCRIPTION MEDICATION Apply 1 application topically daily  as needed (rosasea). Finacea Cream, uses PRN   Respiratory Therapy Supplies (FLUTTER) DEVI 1 Device by Does not apply route as directed.   RESTASIS 0.05 % ophthalmic emulsion Place 1 drop into both eyes 2 (two) times daily.    vitamin C (ASCORBIC ACID) 500 MG tablet Take 500 mg by mouth daily.     [DISCONTINUED] benazepril (LOTENSIN) 10 MG tablet Take 1 tablet (10 mg total) by mouth daily. Please keep upcoming appt for future refills.   [DISCONTINUED] carvedilol (COREG) 6.25 MG tablet TAKE 1 TABLET BY MOUTH TWICE DAILY WITH A MEAL. DOSE INCREASE   [DISCONTINUED] furosemide (LASIX) 20 MG tablet TAKE 1 TABLET(20 MG) BY MOUTH DAILY   [  DISCONTINUED] potassium chloride SA (K-DUR,KLOR-CON) 20 MEQ tablet TAKE 1 TABLET(20 MEQ) BY MOUTH DAILY     Allergies:   Cafergot [ergotamine-caffeine]; Compazine [prochlorperazine edisylate]; Ergotamine-caffeine; Ergotamine-caffeine; Prochlorperazine; Prochlorperazine edisylate; Tetanus-diphtheria toxoids td; Other; and Tetanus toxoid adsorbed   Social History   Tobacco Use   Smoking status: Never Smoker   Smokeless tobacco: Never Used  Substance Use Topics   Alcohol use: Yes    Comment: daily   Drug use: No     Family Hx: The patient's family history includes Aortic stenosis in her father; Diabetes in her sister and sister; Hypertension in her father and mother; Hyperthyroidism in her sister, sister, sister, and sister. There is no history of Colon cancer or Breast cancer.  ROS:   Please see the history of present illness.     All other systems reviewed and are negative.   Prior CV studies:   The following studies were reviewed today:  Echocardiogram 03/12/2015 Study Conclusions  - Left ventricle: The cavity size was normal. Wall thickness was   normal. Systolic function was normal. The estimated ejection   fraction was in the range of 55% to 60%. - Mitral valve: There was mild regurgitation.  Echo 02/01/2015: EF 30%  Left Heart  Cath and Coronary Angiography 01/31/15  Conclusion    There is severe left ventricular systolic dysfunction in a pattern of stress induced/Takatsubo cardiomyopathy.  No significant CAD.   Continue medical therapy for left ventricular dysfunction.       Labs/Other Tests and Data Reviewed:    EKG:  An ECG dated 11/25/2017 was personally reviewed today and demonstrated:  NSR, 63 bpm, with LVH  Recent Labs: No results found for requested labs within last 8760 hours.   Recent Lipid Panel Lab Results  Component Value Date/Time   CHOL 158 02/01/2015 03:32 AM   TRIG 65 02/01/2015 03:32 AM   HDL 66 02/01/2015 03:32 AM   CHOLHDL 2.4 02/01/2015 03:32 AM   LDLCALC 79 02/01/2015 03:32 AM    Wt Readings from Last 3 Encounters:  11/16/18 108 lb (49 kg)  05/05/18 111 lb 3.2 oz (50.4 kg)  11/25/17 115 lb (52.2 kg)     Objective:    Vital Signs:  BP 119/69    Pulse 68    Temp (!) 96.4 F (35.8 C)    Resp 16    Ht 5' 7.5" (1.715 m)    Wt 108 lb (49 kg)    BMI 16.67 kg/m    VITAL SIGNS:  reviewed GEN:  no acute distress EYES:  sclerae anicteric, EOMI - Extraocular Movements Intact RESPIRATORY:  normal respiratory effort, symmetric expansion CARDIOVASCULAR:  no peripheral edema SKIN:  Appears well perfused MUSCULOSKELETAL:  no obvious deformities. NEURO:  alert and oriented x 3, no obvious focal deficit PSYCH:  normal affect  ASSESSMENT & PLAN:    History of Takotsubo Cardiomyopathy -Pt had episode in 2007 with a recurrence in 01/2015. She had normalization of EF per follow up echo in 02/2015. Normal coronary arteries by cath 01/2015.  -continues on ACE-I and BB, both increased by PCP for elevated BP which is now well controlled. Currently having no issues with Raynaud's symptoms, during warm weather.  -Pt is having no heart failure type symptoms and is exercising regularly. She likely did not have trouble with Takotsubo with the loss of her husband as this was a prolonged illness and  she is actually feeling some relief with his passing.  -I will have her follow up  in a year with Dr. Burt Knack. 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 She will call us if needed earlier.   Hypertension -On benazepril and carvedilol.  -BP well controlled.  -Labs followed by PCP.   COVID-19 Education: The signs and symptoms of COVID-19 were discussed with the patient and how to seek care for testing (follow up with PCP or arrange E-visit).  The importance of social distancing was discussed today.  Time:   Today, I have spent 20 minutes with the patient with telehealth technology discussing the above problems.     Medication Adjustments/Labs and Tests Ordered: Current medicines are reviewed at length with the patient today.  Concerns regarding medicines are outlined above.   Tests Ordered: No orders of the defined types were placed in this encounter.   Medication Changes: Meds ordered this encounter  Medications   potassium chloride SA (K-DUR) 20 MEQ tablet    Sig: TAKE 1 TABLET(20 MEQ) BY MOUTH DAILY    Dispense:  90 tablet    Refill:  3   furosemide (LASIX) 20 MG tablet    Sig: TAKE 1 TABLET(20 MG) BY MOUTH DAILY    Dispense:  90 tablet    Refill:  3   benazepril (LOTENSIN) 10 MG tablet    Sig: Take 1 tablet (10 mg total) by mouth daily.    Dispense:  90 tablet    Refill:  3   carvedilol (COREG) 6.25 MG tablet    Sig: TAKE 1 TABLET BY MOUTH TWICE DAILY WITH A MEAL. DOSE INCREASE    Dispense:  180 tablet    Refill:  3    Disposition:  Follow up in 1 year(s)  Signed, Daune Perch, NP  11/16/2018 10:36 AM    [place]

## 2018-11-15 NOTE — Telephone Encounter (Signed)
YOUR CARDIOLOGY TEAM HAS ARRANGED FOR AN E-VISIT FOR YOUR APPOINTMENT - PLEASE REVIEW IMPORTANT INFORMATION BELOW SEVERAL DAYS PRIOR TO YOUR APPOINTMENT  Due to the recent COVID-19 pandemic, we are transitioning in-person office visits to tele-medicine visits in an effort to decrease unnecessary exposure to our patients, their families, and staff. These visits are billed to your insurance just like a normal visit is. We also encourage you to sign up for MyChart if you have not already done so. You will need a smartphone if possible. For patients that do not have this, we can still complete the visit using a regular telephone but do prefer a smartphone to enable video when possible. You may have a family member that lives with you that can help. If possible, we also ask that you have a blood pressure cuff and scale at home to measure your blood pressure, heart rate and weight prior to your scheduled appointment. Patients with clinical needs that need an in-person evaluation and testing will still be able to come to the office if absolutely necessary. If you have any questions, feel free to call our office.     YOUR PROVIDER WILL BE USING THE FOLLOWING PLATFORM TO COMPLETE YOUR VISIT: Doxmity  . IF USING MYCHART - How to Download the MyChart App to Your SmartPhone   - If Apple, go to [employer] and type in MyChart in the search bar and download the app. If Android, ask patient to go to [employer] and type in [place] in the search bar and download the app. The app is free but as with any other app downloads, your phone may require you to verify saved payment information or Apple/Android password.  - You will need to then log into the app with your MyChart username and password, and select [facility] as your healthcare provider to link the account.  - When it is time for your visit, go to the MyChart app, find appointments, and click Begin Video Visit. Be sure to Select Allow for your device to  access the Microphone and Camera for your visit. You will then be connected, and your provider will be with you shortly.  **If you have any issues connecting or need assistance, please contact MyChart service desk [phone])**  **If using a computer, in order to ensure the best quality for your visit, you will need to use either of the following Internet Browsers: Insurance underwriter or [employer]**  . IF USING DOXIMITY or DOXY.ME - The staff will give you instructions on receiving your link to join the meeting the day of your visit.      2-3 DAYS BEFORE YOUR APPOINTMENT  You will receive a telephone call from one of our [place] team members - your caller ID may say "Unknown caller." If this is a video visit, we will walk you through how to get the video launched on your phone. We will remind you check your blood pressure, heart rate and weight prior to your scheduled appointment. If you have an Apple Watch or Kardia, please upload any pertinent ECG strips the day before or morning of your appointment to [place]. Our staff will also make sure you have reviewed the consent and agree to move forward with your scheduled tele-health visit.     THE DAY OF YOUR APPOINTMENT  Approximately 15 minutes prior to your scheduled appointment, you will receive a telephone call from one of [facility] team - your caller ID may say "Unknown caller."  Our staff will confirm medications, vital signs for the day and any symptoms you may be experiencing. Please have this information available prior to the time of visit start. It may also be helpful for you to have a pad of paper and pen handy for any instructions given during your visit. They will also walk you through joining the smartphone meeting if this is a video visit.    CONSENT FOR TELE-HEALTH VISIT - PLEASE REVIEW  I hereby voluntarily request, consent and authorize [facility] and its employed or contracted physicians, physician  assistants, nurse practitioners or other licensed health care professionals (the Practitioner), to provide me with telemedicine health care services (the "Services") as deemed necessary by the treating Practitioner. I acknowledge and consent to receive the Services by the Practitioner via telemedicine. I understand that the telemedicine visit will involve communicating with the Practitioner through live audiovisual communication technology and the disclosure of certain medical information by electronic transmission. I acknowledge that I have been given the opportunity to request an in-person assessment or other available alternative prior to the telemedicine visit and am voluntarily participating in the telemedicine visit.  I understand that I have the right to withhold or withdraw my consent to the use of telemedicine in the course of my care at any time, without affecting my right to future care or treatment, and that the Practitioner or I may terminate the telemedicine visit at any time. I understand that I have the right to inspect all information obtained and/or recorded in the course of the telemedicine visit and may receive copies of available information for a reasonable fee.  I understand that some of the potential risks of receiving the Services via telemedicine include:  Marland Kitchen Delay or interruption in medical evaluation due to technological equipment failure or disruption; . Information transmitted may not be sufficient (e.g. poor resolution of images) to allow for appropriate medical decision making by the Practitioner; and/or  . In rare instances, security protocols could fail, causing a breach of personal health information.  Furthermore, I acknowledge that it is my responsibility to provide information about my medical history, conditions and care that is complete and accurate to the best of my ability. I acknowledge that Practitioner's advice, recommendations, and/or decision may be based on  factors not within their control, such as incomplete or inaccurate data provided by me or distortions of diagnostic images or specimens that may result from electronic transmissions. I understand that the practice of medicine is not an exact science and that Practitioner makes no warranties or guarantees regarding treatment outcomes. I acknowledge that I will receive a copy of this consent concurrently upon execution via email to the email address I last provided but may also request a printed copy by calling the office of [facility].    I understand that my insurance will be billed for this visit.   I have read or had this consent read to me. . I understand the contents of this consent, which adequately explains the benefits and risks of the Services being provided via telemedicine.  . I have been provided ample opportunity to ask questions regarding this consent and the Services and have had my questions answered to my satisfaction. . I give my informed consent for the services to be provided through the use of telemedicine in my medical care  By participating in this telemedicine visit I agree to the above.

## 2018-11-16 ENCOUNTER — Ambulatory Visit: Payer: Medicare Other | Admitting: Cardiology

## 2018-11-16 ENCOUNTER — Telehealth (INDEPENDENT_AMBULATORY_CARE_PROVIDER_SITE_OTHER): Payer: Medicare Other | Admitting: Cardiology

## 2018-11-16 ENCOUNTER — Other Ambulatory Visit: Payer: Self-pay

## 2018-11-16 ENCOUNTER — Encounter (HOSPITAL_COMMUNITY): Payer: Self-pay

## 2018-11-16 ENCOUNTER — Encounter: Payer: Self-pay | Admitting: Cardiology

## 2018-11-16 VITALS — BP 119/69 | HR 68 | Temp 96.4°F | Resp 16 | Ht 67.5 in | Wt 108.0 lb

## 2018-11-16 DIAGNOSIS — I5181 Takotsubo syndrome: Secondary | ICD-10-CM | POA: Diagnosis not present

## 2018-11-16 DIAGNOSIS — I10 Essential (primary) hypertension: Secondary | ICD-10-CM

## 2018-11-16 MED ORDER — CARVEDILOL 6.25 MG PO TABS: ORAL_TABLET | ORAL | 3 refills | Status: DC

## 2018-11-16 MED ORDER — POTASSIUM CHLORIDE CRYS ER 20 MEQ PO TBCR: EXTENDED_RELEASE_TABLET | ORAL | 3 refills | Status: DC

## 2018-11-16 MED ORDER — FUROSEMIDE 20 MG PO TABS: ORAL_TABLET | ORAL | 3 refills | Status: DC

## 2018-11-16 MED ORDER — BENAZEPRIL HCL 10 MG PO TABS: 10.0000 mg | ORAL_TABLET | Freq: Every day | ORAL | 3 refills | Status: DC

## 2018-11-16 NOTE — Patient Instructions (Signed)
Medication Instructions:  Your physician recommends that you continue on your current medications as directed. Please refer to the Current Medication list given to you today.  If you need a refill on your cardiac medications before your next appointment, please call your pharmacy.   Lab work: None  If you have labs (blood work) drawn today and your tests are completely normal, you will receive your results only by: Marland Kitchen MyChart Message (if you have MyChart) OR . A paper copy in the mail If you have any lab test that is abnormal or we need to change your treatment, we will call you to review the results.  Testing/Procedures: None  Follow-Up: At [facility], you and your health needs are our priority.  As part of our continuing mission to provide you with exceptional heart care, we have created designated Provider Care Teams.  These Care Teams include your primary Cardiologist (physician) and Advanced Practice Providers (APPs -  Physician Assistants and Nurse Practitioners) who all work together to provide you with the care you need, when you need it. You will need a follow up appointment in:  12 months.  Please call our office 2 months in advance to schedule this appointment.  You may see Sherren Mocha, MD or one of the following Advanced Practice Providers on your designated Care Team: Richardson Dopp, PA-C [place], [place] . Daune Perch, NP  Any Other Special Instructions Will Be Listed Below (If Applicable).

## 2018-11-18 ENCOUNTER — Encounter (HOSPITAL_COMMUNITY): Payer: Self-pay

## 2018-11-19 ENCOUNTER — Encounter (HOSPITAL_COMMUNITY): Payer: Self-pay

## 2018-11-23 ENCOUNTER — Encounter (HOSPITAL_COMMUNITY): Payer: Self-pay

## 2018-11-25 ENCOUNTER — Encounter (HOSPITAL_COMMUNITY): Payer: Self-pay

## 2018-11-26 ENCOUNTER — Encounter (HOSPITAL_COMMUNITY): Payer: Self-pay

## 2018-11-30 ENCOUNTER — Encounter (HOSPITAL_COMMUNITY): Payer: Self-pay

## 2018-12-02 ENCOUNTER — Encounter (HOSPITAL_COMMUNITY): Payer: Self-pay

## 2018-12-03 ENCOUNTER — Encounter (HOSPITAL_COMMUNITY): Payer: Self-pay

## 2018-12-07 ENCOUNTER — Encounter (HOSPITAL_COMMUNITY): Payer: Self-pay

## 2018-12-09 ENCOUNTER — Encounter (HOSPITAL_COMMUNITY): Payer: Self-pay

## 2018-12-10 ENCOUNTER — Encounter (HOSPITAL_COMMUNITY): Payer: Self-pay

## 2018-12-10 DIAGNOSIS — N281 Cyst of kidney, acquired: Secondary | ICD-10-CM | POA: Diagnosis not present

## 2018-12-14 ENCOUNTER — Encounter (HOSPITAL_COMMUNITY): Payer: Self-pay

## 2018-12-16 ENCOUNTER — Encounter (HOSPITAL_COMMUNITY): Payer: Self-pay

## 2018-12-16 ENCOUNTER — Other Ambulatory Visit: Payer: Self-pay | Admitting: Endocrinology

## 2018-12-16 DIAGNOSIS — Z1231 Encounter for screening mammogram for malignant neoplasm of breast: Secondary | ICD-10-CM

## 2018-12-17 ENCOUNTER — Encounter (HOSPITAL_COMMUNITY): Payer: Self-pay

## 2019-01-18 ENCOUNTER — Telehealth (HOSPITAL_COMMUNITY): Payer: Self-pay

## 2019-02-02 ENCOUNTER — Other Ambulatory Visit: Payer: Self-pay

## 2019-02-02 ENCOUNTER — Ambulatory Visit
Admission: RE | Admit: 2019-02-02 | Discharge: 2019-02-02 | Disposition: A | Discharge status: Home / Self Care | Payer: Medicare Other | Source: Ambulatory Visit | Attending: Endocrinology | Admitting: Endocrinology

## 2019-02-02 DIAGNOSIS — Z1231 Encounter for screening mammogram for malignant neoplasm of breast: Secondary | ICD-10-CM

## 2019-03-10 DIAGNOSIS — Z23 Encounter for immunization: Secondary | ICD-10-CM | POA: Diagnosis not present

## 2019-03-15 DIAGNOSIS — H524 Presbyopia: Secondary | ICD-10-CM | POA: Diagnosis not present

## 2019-03-29 DIAGNOSIS — K5010 Crohn's disease of large intestine without complications: Secondary | ICD-10-CM | POA: Diagnosis not present

## 2019-04-21 DIAGNOSIS — D225 Melanocytic nevi of trunk: Secondary | ICD-10-CM | POA: Diagnosis not present

## 2019-04-21 DIAGNOSIS — L821 Other seborrheic keratosis: Secondary | ICD-10-CM | POA: Diagnosis not present

## 2019-04-21 DIAGNOSIS — C44612 Basal cell carcinoma of skin of right upper limb, including shoulder: Secondary | ICD-10-CM | POA: Diagnosis not present

## 2019-04-21 DIAGNOSIS — D2371 Other benign neoplasm of skin of right lower limb, including hip: Secondary | ICD-10-CM | POA: Diagnosis not present

## 2019-04-21 DIAGNOSIS — D2262 Melanocytic nevi of left upper limb, including shoulder: Secondary | ICD-10-CM | POA: Diagnosis not present

## 2019-05-04 DIAGNOSIS — J479 Bronchiectasis, uncomplicated: Secondary | ICD-10-CM | POA: Diagnosis not present

## 2019-05-04 DIAGNOSIS — I5181 Takotsubo syndrome: Secondary | ICD-10-CM | POA: Diagnosis not present

## 2019-05-04 DIAGNOSIS — N281 Cyst of kidney, acquired: Secondary | ICD-10-CM | POA: Diagnosis not present

## 2019-05-04 DIAGNOSIS — K5090 Crohn's disease, unspecified, without complications: Secondary | ICD-10-CM | POA: Diagnosis not present

## 2019-05-06 DIAGNOSIS — L718 Other rosacea: Secondary | ICD-10-CM | POA: Diagnosis not present

## 2019-05-06 DIAGNOSIS — D485 Neoplasm of uncertain behavior of skin: Secondary | ICD-10-CM | POA: Diagnosis not present

## 2019-05-06 DIAGNOSIS — Z85828 Personal history of other malignant neoplasm of skin: Secondary | ICD-10-CM | POA: Diagnosis not present

## 2019-05-06 DIAGNOSIS — C44612 Basal cell carcinoma of skin of right upper limb, including shoulder: Secondary | ICD-10-CM | POA: Diagnosis not present

## 2019-05-06 DIAGNOSIS — L818 Other specified disorders of pigmentation: Secondary | ICD-10-CM | POA: Diagnosis not present

## 2019-05-16 ENCOUNTER — Other Ambulatory Visit: Payer: Self-pay | Admitting: *Deleted

## 2019-05-16 DIAGNOSIS — Z20822 Contact with and (suspected) exposure to covid-19: Secondary | ICD-10-CM

## 2019-05-17 LAB — NOVEL CORONAVIRUS, NAA

## 2019-05-18 ENCOUNTER — Other Ambulatory Visit: Payer: Self-pay

## 2019-05-18 DIAGNOSIS — Z20822 Contact with and (suspected) exposure to covid-19: Secondary | ICD-10-CM

## 2019-05-18 DIAGNOSIS — Z20828 Contact with and (suspected) exposure to other viral communicable diseases: Secondary | ICD-10-CM | POA: Diagnosis not present

## 2019-05-19 LAB — NOVEL CORONAVIRUS, NAA: SARS-CoV-2, NAA: NOT DETECTED

## 2019-05-20 DIAGNOSIS — M79674 Pain in right toe(s): Secondary | ICD-10-CM | POA: Diagnosis not present

## 2019-06-13 DIAGNOSIS — N281 Cyst of kidney, acquired: Secondary | ICD-10-CM | POA: Diagnosis not present

## 2019-08-03 ENCOUNTER — Ambulatory Visit: Payer: Medicare Other | Attending: Internal Medicine

## 2019-08-03 DIAGNOSIS — Z23 Encounter for immunization: Secondary | ICD-10-CM | POA: Insufficient documentation

## 2019-08-03 NOTE — Progress Notes (Signed)
   Covid-19 Vaccination Clinic  Name:  Ayvah Zellmann    MRN: [id number] DOB: 11-26-1945  08/03/2019  Ms. Norred was observed post Covid-19 immunization for 15 minutes without incidence. She was provided with Vaccine Information Sheet and instruction to access the V-Safe system.   Ms. Vizzini was instructed to call 911 with any severe reactions post vaccine: Marland Kitchen Difficulty breathing  . Swelling of your face and throat  . A fast heartbeat  . A bad rash all over your body  . Dizziness and weakness    Immunizations Administered    Name Date Dose VIS Date Route   Pfizer COVID-19 Vaccine 08/03/2019 10:37 AM 0.3 mL 07/01/2019 Intramuscular   Manufacturer: [employer], [employer]   Lot: [id number]   [place]: [id number]

## 2019-08-23 ENCOUNTER — Ambulatory Visit: Payer: Medicare Other | Attending: Internal Medicine

## 2019-08-23 DIAGNOSIS — Z23 Encounter for immunization: Secondary | ICD-10-CM

## 2019-08-23 NOTE — Progress Notes (Signed)
   Covid-19 Vaccination Clinic  Name:  Marceline Mcguigan    MRN: [id number] DOB: [birth year]  08/23/2019  Ms. Crick was observed post Covid-19 immunization for 15 minutes without incidence. She was provided with Vaccine Information Sheet and instruction to access the V-Safe system.   Ms. Filipowicz was instructed to call 911 with any severe reactions post vaccine: Marland Kitchen Difficulty breathing  . Swelling of your face and throat  . A fast heartbeat  . A bad rash all over your body  . Dizziness and weakness    Immunizations Administered    Name Date Dose VIS Date Route   Pfizer COVID-19 Vaccine 08/23/2019 10:04 AM 0.3 mL 07/01/2019 Intramuscular   Manufacturer: [place]   Lot: [id number]   [place]: [id number]

## 2019-10-25 DIAGNOSIS — J984 Other disorders of lung: Secondary | ICD-10-CM | POA: Diagnosis not present

## 2019-10-25 DIAGNOSIS — E7849 Other hyperlipidemia: Secondary | ICD-10-CM | POA: Diagnosis not present

## 2019-10-25 DIAGNOSIS — K5190 Ulcerative colitis, unspecified, without complications: Secondary | ICD-10-CM | POA: Diagnosis not present

## 2019-10-25 DIAGNOSIS — E538 Deficiency of other specified B group vitamins: Secondary | ICD-10-CM | POA: Diagnosis not present

## 2019-11-21 ENCOUNTER — Encounter: Payer: Self-pay | Admitting: Cardiovascular Disease

## 2019-11-21 ENCOUNTER — Other Ambulatory Visit: Payer: Self-pay

## 2019-11-21 ENCOUNTER — Ambulatory Visit: Payer: Medicare Other | Admitting: Cardiovascular Disease

## 2019-11-21 VITALS — BP 142/72 | HR 62 | Ht 67.5 in | Wt 113.0 lb

## 2019-11-21 DIAGNOSIS — I10 Essential (primary) hypertension: Secondary | ICD-10-CM

## 2019-11-21 DIAGNOSIS — I5181 Takotsubo syndrome: Secondary | ICD-10-CM | POA: Diagnosis not present

## 2019-11-21 MED ORDER — FUROSEMIDE 20 MG PO TABS: 20.0000 mg | ORAL_TABLET | Freq: Every day | ORAL | 3 refills | Status: DC

## 2019-11-21 MED ORDER — CARVEDILOL 6.25 MG PO TABS: 6.2500 mg | ORAL_TABLET | Freq: Two times a day (BID) | ORAL | 3 refills | Status: DC

## 2019-11-21 MED ORDER — BENAZEPRIL HCL 10 MG PO TABS: 10.0000 mg | ORAL_TABLET | Freq: Every day | ORAL | 3 refills | Status: DC

## 2019-11-21 MED ORDER — POTASSIUM CHLORIDE CRYS ER 20 MEQ PO TBCR: 20.0000 meq | EXTENDED_RELEASE_TABLET | Freq: Every day | ORAL | 3 refills | Status: DC

## 2019-11-21 NOTE — Patient Instructions (Signed)

## 2019-11-21 NOTE — Progress Notes (Signed)
Cardiology Office Note:    Date:  11/21/2019   ID:  Kirsten Rice, DOB [birth year], MRN [id number]  PCP:  Kirsten Bowen, MD  Cardiologist:  Kirsten Mocha, MD  Electrophysiologist:  None   Referring MD: Kirsten Queen, MD   Chief Complaint  Patient presents with  . Follow-up    Takotsubo Cardiomyopathy    History of Present Illness:    Kirsten Rice is a 74 y.o. female with a hx of Takotsubo cardiomyopathy, presenting for follow-up evaluation.  She initially presented in 2007 and had a recurrence in 2016.  She underwent cardiac catheterization most recently in 2016 demonstrating normal coronary arteries with LVEF 25 to 35% and a typical pattern of Takotsubo syndrome.  Follow-up echo assessment showed normalization of LV function with an ejection fraction of 60%.  She is here alone today. Her husband passed away last year after a stay in the memory care unit of [facility]. She has felt pretty isolated during the Covid 19 pandemic. She has been vaccinated. Today, she denies symptoms of palpitations, chest pain, shortness of breath, orthopnea, PND, lower extremity edema, dizziness, or syncope.   Past Medical History:  Diagnosis Date  . Arthritis    "probably a little bit in my knees and hips" (01/31/2015)  . Basal cell carcinoma   . Chronic systolic CHF (congestive heart failure) ([place])    a. 01/2015 Echo: EF 25-35% in setting of takotsubo cardiomyopathy.  . Crohn's disease ([place])   . Deep vein thrombosis ([place])    pt denies: 04/08/13  . Essential hypertension   . History of pneumonia    "2-3 times" (01/31/2015)  . Hyperthyroidism    pt denies:  04/08/13  . Mitral valve prolapse    a. 01/2015 Echo: Ef 25-35%, triv MR.  . Myocardial infarct ([place])   . Ocular headache    "weekly" (01/31/2015)  . Persistent dry cough   . PONV (postoperative nausea and vomiting) 1970's   "when I had my appendix out"  . Raynaud's disease   . Squamous carcinoma   . Takotsubo cardiomyopathy    a. 2007  Cath: nl cors, apical ballooning w/ subsequent recovery of LV fxn;  b. 01/2015 NSTEMI/Cath: nl cors, EF 25-25% w/ apical ballooning;  c. 01/2015 echo: EF 30%, septal, apical, mid anterior, inf HK, mildly dil LA/RA.  Marland Kitchen Telangiectasia     Past Surgical History:  Procedure Laterality Date  . APPENDECTOMY  1971  . BASAL CELL CARCINOMA EXCISION     forehead, back, legs, arms" (01/31/2015)  . CARDIAC CATHETERIZATION  2006; 2007  . CARDIAC CATHETERIZATION N/A 01/31/2015   Procedure: Left Heart Cath and Coronary Angiography;  Surgeon: Kirsten Booze, MD;  Location: [facility];  Service: Cardiovascular;  Laterality: N/A;  . COLONOSCOPY    . COLONOSCOPY  05/11/2012   Procedure: COLONOSCOPY;  Surgeon: Kirsten Dragon, MD;  Location: [facility];  Service: Endoscopy;  Laterality: N/A;  . SHOULDER ARTHROSCOPY W/ ROTATOR CUFF REPAIR Right 1996  . SQUAMOUS CELL CARCINOMA EXCISION Left early 1980's   "eyelid"  . TUBAL LIGATION  1973    Current Medications: Current Meds  Medication Sig  . acetaminophen (TYLENOL) 325 MG tablet Take 650 mg by mouth every 6 (six) hours as needed for mild pain or headache.  Marland Kitchen aspirin 81 MG tablet Take 81 mg by mouth daily.    . balsalazide (COLAZAL) 750 MG capsule Take 2,250 mg by mouth 3 (three) times daily.   . benazepril (LOTENSIN) 10 MG tablet  Take 1 tablet (10 mg total) by mouth daily.  . bifidobacterium infantis (ALIGN) capsule Take 1 capsule by mouth daily.  . Biotin 2500 MCG CAPS Take 2,500 mcg by mouth daily.   . budesonide (ENTOCORT EC) 3 MG 24 hr capsule Take 9 mg by mouth daily.  . Calcium-Vitamin D 600-200 MG-UNIT per tablet Take 1 tablet by mouth daily.   . carvedilol (COREG) 6.25 MG tablet Take 1 tablet (6.25 mg total) by mouth 2 (two) times daily with a meal.  . Cholecalciferol (VITAMIN D3) 25 MCG (1000 UT) CAPS Take by mouth.  . Flaxseed, Linseed, (FLAXSEED OIL PO) Take by mouth.  . fluticasone (FLONASE) 50 MCG/ACT nasal spray USE 2 SPRAYS IN  EACH NOSTRIL DAILY AS NEEDED FOR CONGESTION.  . furosemide (LASIX) 20 MG tablet Take 1 tablet (20 mg total) by mouth daily.  . nitroGLYCERIN (NITROSTAT) 0.4 MG SL tablet DISSOLVE 1 TABLET UNDER THE TONGUE EVERY 5 MINUTES FOR UP TO 3 DOSES AS NEEDED FOR CHEST PAIN  . potassium chloride SA (KLOR-CON) 20 MEQ tablet Take 1 tablet (20 mEq total) by mouth daily.  Marland Kitchen PRESCRIPTION MEDICATION Apply 1 application topically daily as needed (rosasea). Finacea Cream, uses PRN  . vitamin B-12 (CYANOCOBALAMIN) 1000 MCG tablet Take 1,000 mcg by mouth daily.  . vitamin C (ASCORBIC ACID) 500 MG tablet Take 500 mg by mouth daily.    . vitamin E 180 MG (400 UNITS) capsule Take 1 capsule by mouth daily.  . [DISCONTINUED] benazepril (LOTENSIN) 10 MG tablet Take 1 tablet (10 mg total) by mouth daily.  . [DISCONTINUED] carvedilol (COREG) 6.25 MG tablet TAKE 1 TABLET BY MOUTH TWICE DAILY WITH A MEAL. DOSE INCREASE  . [DISCONTINUED] clobetasol cream (TEMOVATE) [id number] % Apply 1 application topically at bedtime as needed (dry skin on hands).   . [DISCONTINUED] furosemide (LASIX) 20 MG tablet TAKE 1 TABLET(20 MG) BY MOUTH DAILY  . [DISCONTINUED] potassium chloride SA (K-DUR) 20 MEQ tablet TAKE 1 TABLET(20 MEQ) BY MOUTH DAILY  . [DISCONTINUED] Respiratory Therapy Supplies (FLUTTER) DEVI 1 Device by Does not apply route as directed.  . [DISCONTINUED] RESTASIS 0.05 % ophthalmic emulsion Place 1 drop into both eyes 2 (two) times daily.      Allergies:   Cafergot [ergotamine-caffeine], Compazine [prochlorperazine edisylate], Ergotamine-caffeine, Ergotamine-caffeine, Prochlorperazine, Prochlorperazine edisylate, Tetanus-diphtheria toxoids td, Other, and Tetanus toxoid adsorbed   Social History   Socioeconomic History  . Marital status: Widowed    Spouse name: Not on file  . Number of children: Not on file  . Years of education: Not on file  . Highest education level: Not on file  Occupational History  . Occupation: Retired     Comment: Marine scientist  Tobacco Use  . Smoking status: Never Smoker  . Smokeless tobacco: Never Used  Substance and Sexual Activity  . Alcohol use: Yes    Comment: daily  . Drug use: No  . Sexual activity: Not on file  Other Topics Concern  . Not on file  Social History Narrative  . Not on file   Social Determinants of Health   Financial Resource Strain:   . Difficulty of Paying Living Expenses:   Food Insecurity:   . Worried About Charity fundraiser in the Last Year:   . Arboriculturist in the Last Year:   Transportation Needs:   . Film/video editor (Medical):   Marland Kitchen Lack of Transportation (Non-Medical):   Physical Activity:   . Days of Exercise per Week:   .  Minutes of Exercise per Session:   Stress:   . Feeling of Stress :   Social Connections:   . Frequency of Communication with Friends and Family:   . Frequency of Social Gatherings with Friends and Family:   . Attends Religious Services:   . Active Member of Clubs or Organizations:   . Attends Archivist Meetings:   Marland Kitchen Marital Status:      Family History: The patient's family history includes Aortic stenosis in her father; Diabetes in her sister and sister; Hypertension in her father and mother; Hyperthyroidism in her sister, sister, sister, and sister. There is no history of Colon cancer or Breast cancer.  ROS:   Please see the history of present illness.    All other systems reviewed and are negative.  EKGs/Labs/Other Studies Reviewed:    EKG:  EKG is ordered today.  The ekg ordered today demonstrates 62 bpm, within normal limits  Recent Labs: No results found for requested labs within last 8760 hours.  Recent Lipid Panel    Component Value Date/Time   CHOL 158 02/01/2015 0332   TRIG 65 02/01/2015 0332   HDL 66 02/01/2015 0332   CHOLHDL 2.4 02/01/2015 0332   VLDL 13 02/01/2015 0332   LDLCALC 79 02/01/2015 0332    Physical Exam:    VS:  BP (!) 142/72   Pulse 62   Ht 5' 7.5" (1.715 m)   Wt  113 lb (51.3 kg)   SpO2 91%   BMI 17.44 kg/m     Wt Readings from Last 3 Encounters:  11/21/19 113 lb (51.3 kg)  11/16/18 108 lb (49 kg)  05/05/18 111 lb 3.2 oz (50.4 kg)     GEN:  Well nourished, well developed thin elderly woman in no acute distress HEENT: Normal NECK: No JVD; No carotid bruits LYMPHATICS: No lymphadenopathy CARDIAC: RRR, no murmurs, rubs, gallops RESPIRATORY:  Clear to auscultation without rales, wheezing or rhonchi  ABDOMEN: Soft, non-tender, non-distended MUSCULOSKELETAL:  No edema; No deformity  SKIN: Warm and dry NEUROLOGIC:  Alert and oriented x 3 PSYCHIATRIC:  Normal affect   ASSESSMENT:    1. Takotsubo cardiomyopathy   2. Benign essential HTN    PLAN:    In order of problems listed above:  1. No recurrence, LVEF has normalized, doing well. Her stress level is now much better.  2. BP ranges 110-140 mmHg at home. Diastolic BP's range in the 60-70's. Continue carvedilol and benazepril.  Labs from October 2020 reviewed with creatinine 0.8 mg/dL.   Medication Adjustments/Labs and Tests Ordered: Current medicines are reviewed at length with the patient today.  Concerns regarding medicines are outlined above.  Orders Placed This Encounter  Procedures  . EKG 12-Lead   Meds ordered this encounter  Medications  . benazepril (LOTENSIN) 10 MG tablet    Sig: Take 1 tablet (10 mg total) by mouth daily.    Dispense:  90 tablet    Refill:  3  . carvedilol (COREG) 6.25 MG tablet    Sig: Take 1 tablet (6.25 mg total) by mouth 2 (two) times daily with a meal.    Dispense:  180 tablet    Refill:  3  . furosemide (LASIX) 20 MG tablet    Sig: Take 1 tablet (20 mg total) by mouth daily.    Dispense:  90 tablet    Refill:  3  . potassium chloride SA (KLOR-CON) 20 MEQ tablet    Sig: Take 1 tablet (20 mEq total)  by mouth daily.    Dispense:  90 tablet    Refill:  3    Patient Instructions  Medication Instructions:  Your provider recommends that you  continue on your current medications as directed. Please refer to the Current Medication list given to you today.   *If you need a refill on your cardiac medications before your next appointment, please call your pharmacy*   Follow-Up: At [facility], you and your health needs are our priority.  As part of our continuing mission to provide you with exceptional heart care, we have created designated Provider Care Teams.  These Care Teams include your primary Cardiologist (physician) and Advanced Practice Providers (APPs -  Physician Assistants and Nurse Practitioners) who all work together to provide you with the care you need, when you need it. Your next appointment:   12 month(s) The format for your next appointment:   In Person Provider:   You may see Kirsten Mocha, MD or one of the following Advanced Practice Providers on your designated Care Team:    Richardson Dopp, PA-C  Robbie Lis, [place]      Signed, Kirsten Mocha, MD  11/21/2019 1:31 PM    [place]

## 2019-11-25 DIAGNOSIS — B349 Viral infection, unspecified: Secondary | ICD-10-CM | POA: Diagnosis not present

## 2019-11-25 DIAGNOSIS — I10 Essential (primary) hypertension: Secondary | ICD-10-CM | POA: Diagnosis not present

## 2019-11-25 DIAGNOSIS — J309 Allergic rhinitis, unspecified: Secondary | ICD-10-CM | POA: Diagnosis not present

## 2019-11-25 DIAGNOSIS — R110 Nausea: Secondary | ICD-10-CM | POA: Diagnosis not present

## 2019-11-29 DIAGNOSIS — K5010 Crohn's disease of large intestine without complications: Secondary | ICD-10-CM | POA: Diagnosis not present

## 2019-11-29 DIAGNOSIS — Z79899 Other long term (current) drug therapy: Secondary | ICD-10-CM | POA: Diagnosis not present

## 2020-01-03 ENCOUNTER — Other Ambulatory Visit: Payer: Self-pay | Admitting: Endocrinology

## 2020-01-03 DIAGNOSIS — Z1231 Encounter for screening mammogram for malignant neoplasm of breast: Secondary | ICD-10-CM

## 2020-01-12 DIAGNOSIS — Z85828 Personal history of other malignant neoplasm of skin: Secondary | ICD-10-CM | POA: Diagnosis not present

## 2020-01-12 DIAGNOSIS — L821 Other seborrheic keratosis: Secondary | ICD-10-CM | POA: Diagnosis not present

## 2020-02-07 ENCOUNTER — Other Ambulatory Visit: Payer: Self-pay

## 2020-02-07 ENCOUNTER — Ambulatory Visit
Admission: RE | Admit: 2020-02-07 | Discharge: 2020-02-07 | Disposition: A | Discharge status: Home / Self Care | Payer: Medicare Other | Source: Ambulatory Visit | Attending: Endocrinology | Admitting: Endocrinology

## 2020-02-07 DIAGNOSIS — Z1231 Encounter for screening mammogram for malignant neoplasm of breast: Secondary | ICD-10-CM | POA: Diagnosis not present

## 2020-03-07 DIAGNOSIS — K529 Noninfective gastroenteritis and colitis, unspecified: Secondary | ICD-10-CM | POA: Diagnosis not present

## 2020-03-07 DIAGNOSIS — Z1211 Encounter for screening for malignant neoplasm of colon: Secondary | ICD-10-CM | POA: Diagnosis not present

## 2020-03-07 DIAGNOSIS — K5010 Crohn's disease of large intestine without complications: Secondary | ICD-10-CM | POA: Diagnosis not present

## 2020-03-07 DIAGNOSIS — Z9889 Other specified postprocedural states: Secondary | ICD-10-CM | POA: Diagnosis not present

## 2020-04-19 DIAGNOSIS — E785 Hyperlipidemia, unspecified: Secondary | ICD-10-CM | POA: Diagnosis not present

## 2020-04-19 DIAGNOSIS — I10 Essential (primary) hypertension: Secondary | ICD-10-CM | POA: Diagnosis not present

## 2020-04-19 DIAGNOSIS — E538 Deficiency of other specified B group vitamins: Secondary | ICD-10-CM | POA: Diagnosis not present

## 2020-04-19 DIAGNOSIS — M859 Disorder of bone density and structure, unspecified: Secondary | ICD-10-CM | POA: Diagnosis not present

## 2020-04-24 DIAGNOSIS — Z85828 Personal history of other malignant neoplasm of skin: Secondary | ICD-10-CM | POA: Diagnosis not present

## 2020-04-24 DIAGNOSIS — D2261 Melanocytic nevi of right upper limb, including shoulder: Secondary | ICD-10-CM | POA: Diagnosis not present

## 2020-04-24 DIAGNOSIS — L821 Other seborrheic keratosis: Secondary | ICD-10-CM | POA: Diagnosis not present

## 2020-04-24 DIAGNOSIS — L812 Freckles: Secondary | ICD-10-CM | POA: Diagnosis not present

## 2020-04-26 DIAGNOSIS — Z0000 Encounter for general adult medical examination without abnormal findings: Secondary | ICD-10-CM | POA: Diagnosis not present

## 2020-04-26 DIAGNOSIS — R82998 Other abnormal findings in urine: Secondary | ICD-10-CM | POA: Diagnosis not present

## 2020-04-26 DIAGNOSIS — E785 Hyperlipidemia, unspecified: Secondary | ICD-10-CM | POA: Diagnosis not present

## 2020-04-26 DIAGNOSIS — I10 Essential (primary) hypertension: Secondary | ICD-10-CM | POA: Diagnosis not present

## 2020-04-26 DIAGNOSIS — E538 Deficiency of other specified B group vitamins: Secondary | ICD-10-CM | POA: Diagnosis not present

## 2020-05-07 DIAGNOSIS — H2513 Age-related nuclear cataract, bilateral: Secondary | ICD-10-CM | POA: Diagnosis not present

## 2020-06-18 DIAGNOSIS — N281 Cyst of kidney, acquired: Secondary | ICD-10-CM | POA: Diagnosis not present

## 2020-07-23 ENCOUNTER — Ambulatory Visit: Payer: Medicare Other | Admitting: Podiatry

## 2020-07-23 ENCOUNTER — Encounter: Payer: Self-pay | Admitting: Podiatry

## 2020-07-23 ENCOUNTER — Ambulatory Visit (INDEPENDENT_AMBULATORY_CARE_PROVIDER_SITE_OTHER): Payer: Medicare Other

## 2020-07-23 ENCOUNTER — Other Ambulatory Visit: Payer: Self-pay

## 2020-07-23 DIAGNOSIS — S99922A Unspecified injury of left foot, initial encounter: Secondary | ICD-10-CM

## 2020-07-23 DIAGNOSIS — S92505A Nondisplaced unspecified fracture of left lesser toe(s), initial encounter for closed fracture: Secondary | ICD-10-CM | POA: Diagnosis not present

## 2020-07-23 NOTE — Patient Instructions (Signed)
Toe Fracture A toe fracture is a break in one of the toe bones (phalanges). This may happen if you:  Drop a heavy object on your toe.  Stub your toe.  Twist your toe.  Exercise the same way too much. What are the signs or symptoms? The main symptoms are swelling and pain in the toe. You may also have:  Bruising.  Stiffness.  Numbness.  A change in the way the toe looks.  Broken bones that poke through the skin.  Blood under the toenail. How is this treated? Treatments may include:  Taping the broken toe to a toe that is next to it (buddy taping).  Wearing a shoe that has a wide, rigid sole to protect the toe and to limit its movement.  Wearing a cast.  Surgery. This may be needed if the: ? Pieces of broken bone are out of place. ? Bone pokes through the skin.  Physical therapy. Follow these instructions at home: If you have a shoe:  Wear the shoe as told by your doctor. Remove it only as told by your doctor.  Loosen the shoe if your toes tingle, become numb, or turn cold and blue.  Keep the shoe clean and dry. If you have a cast:  Do not put pressure on any part of the cast until it is fully hardened. This may take a few hours.  Do not stick anything inside the cast to scratch your skin.  Check the skin around the cast every day. Tell your doctor about any concerns.  You may put lotion on dry skin around the edges of the cast.  Do not put lotion on the skin under the cast.  Keep the cast clean and dry. Bathing  Do not take baths, swim, or use a hot tub until your doctor says it is okay. Ask your doctor if you can take showers.  If the shoe or cast is not waterproof: ? Do not let it get wet. ? Cover it with a watertight covering when you take a bath or a shower. Activity  Do not use your foot to support your body weight until your doctor says it is okay.  Use crutches as told by your doctor.  Ask your doctor what activities are safe for you  during recovery.  Avoid activities as told by your doctor.  Do exercises as told by your doctor or therapist. Driving  Do not drive or use heavy machinery while taking pain medicine.  Do not drive while wearing a cast on a foot that you use for driving. Managing pain, stiffness, and swelling   Put ice on the injured area if told by your doctor: ? Put ice in a plastic bag. ? Place a towel between your skin and the bag.  If you have a shoe, remove it as told by your doctor.  If you have a cast, place a towel between your cast and the bag. ? Leave the ice on for 20 minutes, 2-3 times per day.  Raise (elevate) the injured area above the level of your heart while you are sitting or lying down. General instructions  If your toe was taped to a toe that is next to it, follow your doctor's instructions for changing the gauze and tape. Change it more often: ? If the gauze and tape get wet. If this happens, dry the space between the toes. ? If the gauze and tape are too tight and they cause your toe to become pale   or to lose feeling (go numb).  If your doctor did not give you a protective shoe, wear sturdy shoes that support your foot. Your shoes should not: ? Pinch your toes. ? Fit tightly against your toes.  Do not use any tobacco products, including cigarettes, chewing tobacco, or e-cigarettes. These can delay bone healing. If you need help quitting, ask your doctor.  Take medicines only as told by your doctor.  Keep all follow-up visits as told by your doctor. This is important. Contact a doctor if:  Your pain medicine is not helping.  You have a fever.  You notice a bad smell coming from your cast. Get help right away if:  You lose feeling (have numbness) in your toe or foot, and it is getting worse.  Your toe or your foot tingles.  Your toe or your foot gets cold or turns blue.  You have redness or swelling in your toe or foot, and it is getting worse.  You have very  bad pain. Summary  A toe fracture is a break in one of the toe bones.  Use ice and raise your foot. This will help lessen pain and swelling.  Use crutches as told by your doctor. This information is not intended to replace advice given to you by your health care provider. Make sure you discuss any questions you have with your health care provider. Document Revised: 09/10/2017 Document Reviewed: 08/18/2017 Elsevier Patient Education  2020 Elsevier Inc.  

## 2020-07-30 NOTE — Progress Notes (Signed)
Subjective:   Patient ID: Kirsten Rice, female   DOB: 75 y.o.   MRN: [id number]   HPI 75 year old female presents the office if concerns of possible broken toes to her left second third toes.  She states on December 18 she hit her foot on a metal post of the bed she had swelling and discomfort.  She also has some bruising.  He has had no recent treatment.  She does states hard to wear shoes at times.  No other concerns today.   Review of Systems  All other systems reviewed and are negative.  Past Medical History:  Diagnosis Date  . Arthritis    "probably a little bit in my knees and hips" (01/31/2015)  . Basal cell carcinoma   . Chronic systolic CHF (congestive heart failure) ([place])    a. 01/2015 Echo: EF 25-35% in setting of takotsubo cardiomyopathy.  . Crohn's disease ([place])   . Deep vein thrombosis ([place])    pt denies: 04/08/13  . Essential hypertension   . History of pneumonia    "2-3 times" (01/31/2015)  . Hyperthyroidism    pt denies:  04/08/13  . Mitral valve prolapse    a. 01/2015 Echo: Ef 25-35%, triv MR.  . Myocardial infarct ([place])   . Ocular headache    "weekly" (01/31/2015)  . Persistent dry cough   . PONV (postoperative nausea and vomiting) 1970's   "when I had my appendix out"  . Raynaud's disease   . Squamous carcinoma   . Takotsubo cardiomyopathy    a. 2007 Cath: nl cors, apical ballooning w/ subsequent recovery of LV fxn;  b. 01/2015 NSTEMI/Cath: nl cors, EF 25-25% w/ apical ballooning;  c. 01/2015 echo: EF 30%, septal, apical, mid anterior, inf HK, mildly dil LA/RA.  Marland Kitchen Telangiectasia     Past Surgical History:  Procedure Laterality Date  . APPENDECTOMY  1971  . BASAL CELL CARCINOMA EXCISION     forehead, back, legs, arms" (01/31/2015)  . CARDIAC CATHETERIZATION  2006; 2007  . CARDIAC CATHETERIZATION N/A 01/31/2015   Procedure: Left Heart Cath and Coronary Angiography;  Surgeon: Jettie Booze, MD;  Location: [facility];  Service: Cardiovascular;   Laterality: N/A;  . COLONOSCOPY    . COLONOSCOPY  05/11/2012   Procedure: COLONOSCOPY;  Surgeon: Lafayette Dragon, MD;  Location: [facility];  Service: Endoscopy;  Laterality: N/A;  . SHOULDER ARTHROSCOPY W/ ROTATOR CUFF REPAIR Right 1996  . SQUAMOUS CELL CARCINOMA EXCISION Left early 1980's   "eyelid"  . TUBAL LIGATION  1973     Current Outpatient Medications:  .  acetaminophen (TYLENOL) 325 MG tablet, Take 650 mg by mouth every 6 (six) hours as needed for mild pain or headache., Disp: , Rfl:  .  aspirin 81 MG tablet, Take 81 mg by mouth daily.  , Disp: , Rfl:  .  balsalazide (COLAZAL) 750 MG capsule, Take 2,250 mg by mouth 3 (three) times daily. , Disp: , Rfl:  .  benazepril (LOTENSIN) 10 MG tablet, Take 1 tablet (10 mg total) by mouth daily., Disp: 90 tablet, Rfl: 3 .  bifidobacterium infantis (ALIGN) capsule, Take 1 capsule by mouth daily., Disp: , Rfl:  .  Biotin 2500 MCG CAPS, Take 2,500 mcg by mouth daily. , Disp: , Rfl:  .  budesonide (ENTOCORT EC) 3 MG 24 hr capsule, Take 9 mg by mouth daily., Disp: , Rfl:  .  Calcium-Vitamin D 600-200 MG-UNIT per tablet, Take 1 tablet by mouth daily. , Disp: ,  Rfl:  .  carvedilol (COREG) 6.25 MG tablet, Take 1 tablet (6.25 mg total) by mouth 2 (two) times daily with a meal., Disp: 180 tablet, Rfl: 3 .  Cholecalciferol (VITAMIN D3) 25 MCG (1000 UT) CAPS, Take by mouth., Disp: , Rfl:  .  Flaxseed, Linseed, (FLAXSEED OIL PO), Take by mouth., Disp: , Rfl:  .  furosemide (LASIX) 20 MG tablet, Take 1 tablet (20 mg total) by mouth daily., Disp: 90 tablet, Rfl: 3 .  nitroGLYCERIN (NITROSTAT) 0.4 MG SL tablet, DISSOLVE 1 TABLET UNDER THE TONGUE EVERY 5 MINUTES FOR UP TO 3 DOSES AS NEEDED FOR CHEST PAIN, Disp: 75 tablet, Rfl: 1 .  potassium chloride SA (KLOR-CON) 20 MEQ tablet, Take 1 tablet (20 mEq total) by mouth daily., Disp: 90 tablet, Rfl: 3 .  PRESCRIPTION MEDICATION, Apply 1 application topically daily as needed (rosasea). Finacea Cream, uses PRN,  Disp: , Rfl:  .  vitamin B-12 (CYANOCOBALAMIN) 1000 MCG tablet, Take 1,000 mcg by mouth daily., Disp: , Rfl:  .  vitamin C (ASCORBIC ACID) 500 MG tablet, Take 500 mg by mouth daily.  , Disp: , Rfl:  .  vitamin E 180 MG (400 UNITS) capsule, Take 1 capsule by mouth daily., Disp: , Rfl:   Allergies  Allergen Reactions  . Cafergot [Ergotamine-Caffeine] Other (See Comments)    Vagal paralysis  . Compazine [Prochlorperazine Edisylate] Other (See Comments)    Other reaction(s): stroke like sx paralysis of face  . Ergotamine-Caffeine     Other reaction(s): vagal response  . Ergotamine-Caffeine     Other reaction(s): Other (See Comments) Other reaction(s): vagal response  . Prochlorperazine     Other reaction(s): Other (See Comments) Patient states like had a stroke  . Prochlorperazine Edisylate Other (See Comments)    REACTION: extrapyrimadal syndrome  . Tetanus-Diphtheria Toxoids Td Swelling    REACTION: arm swells. Back in the 1970's when made with horse serum. Has had tetanus shot since then without problem.  . Other     Other reaction(s): Other (See Comments) Passed out  . Tetanus Toxoid Adsorbed Swelling    Horse serum back in 1970's. Pt has had tetanus shot since          Objective:  Physical Exam  General: AAO x3, NAD  Dermatological: Skin is warm, dry and supple bilateral.  There are no open sores, no preulcerative lesions, no rash or signs of infection present.  He has significant ecchymosis.  Vascular: Dorsalis Pedis artery and Posterior Tibial artery pedal pulses are 2/4 bilateral with immedate capillary fill time.There is no pain with calf compression, swelling, warmth, erythema.   Neruologic: Grossly intact via light touch bilateral.   Musculoskeletal: Tenderness palpation of the second third toes as well as to the distal metatarsals.  There is localized edema but there is no erythema or warmth.  Flexor and extensor tendons appear to be intact.  The toes are in  rectus position.  Muscular strength 5/5 in all groups tested bilateral.  Gait: Unassisted, Nonantalgic.       Assessment:   75 year old female with contusion toe, fracture     Plan:  -Treatment options discussed including all alternatives, risks, and complications -Etiology of symptoms were discussed -X-rays were obtained and reviewed with the patient.  Slight radiolucency present in the third proximal phalanx consistent with likely fracture. -Dispensed surgical shoe -Ice elevation -Limit weightbearing.  If she feels better she can transition to regular shoe as able.    Trula Slade DPM

## 2020-08-14 ENCOUNTER — Ambulatory Visit: Payer: Medicare Other | Admitting: Podiatry

## 2020-09-20 DIAGNOSIS — Z85828 Personal history of other malignant neoplasm of skin: Secondary | ICD-10-CM | POA: Diagnosis not present

## 2020-09-20 DIAGNOSIS — L570 Actinic keratosis: Secondary | ICD-10-CM | POA: Diagnosis not present

## 2020-10-24 DIAGNOSIS — I2510 Atherosclerotic heart disease of native coronary artery without angina pectoris: Secondary | ICD-10-CM | POA: Diagnosis not present

## 2020-10-24 DIAGNOSIS — I700 Atherosclerosis of aorta: Secondary | ICD-10-CM | POA: Diagnosis not present

## 2020-10-24 DIAGNOSIS — E785 Hyperlipidemia, unspecified: Secondary | ICD-10-CM | POA: Diagnosis not present

## 2020-10-24 DIAGNOSIS — I10 Essential (primary) hypertension: Secondary | ICD-10-CM | POA: Diagnosis not present

## 2020-11-02 ENCOUNTER — Ambulatory Visit: Payer: Medicare Other | Attending: Internal Medicine

## 2020-11-02 ENCOUNTER — Other Ambulatory Visit: Payer: Self-pay

## 2020-11-02 DIAGNOSIS — Z23 Encounter for immunization: Secondary | ICD-10-CM

## 2020-11-02 NOTE — Progress Notes (Signed)
   Covid-19 Vaccination Clinic  Name:  Kirsten Rice    MRN: [id number] DOB: 27-Aug-1945  11/02/2020  Kirsten Rice was observed post Covid-19 immunization for 15 minutes without incident. She was provided with Vaccine Information Sheet and instruction to access the V-Safe system.   Kirsten Rice was instructed to call 911 with any severe reactions post vaccine: Marland Kitchen Difficulty breathing  . Swelling of face and throat  . A fast heartbeat  . A bad rash all over body  . Dizziness and weakness   Immunizations Administered    Name Date Dose VIS Date Route   PFIZER Comrnaty(Gray TOP) Covid-19 Vaccine 11/02/2020  9:50 AM 0.3 mL 06/28/2020 Intramuscular   Manufacturer: [employer], [employer]   Lot: [id number]   NDC: [phone]

## 2020-11-05 ENCOUNTER — Other Ambulatory Visit (HOSPITAL_BASED_OUTPATIENT_CLINIC_OR_DEPARTMENT_OTHER): Payer: Self-pay

## 2020-11-05 MED ORDER — COVID-19 MRNA VACCINE (PFIZER) 30 MCG/0.3ML IM SUSP
INTRAMUSCULAR | 0 refills | Status: AC
  Filled 2020-11-05: qty 0.3, 1d supply, fill #0

## 2020-11-06 ENCOUNTER — Other Ambulatory Visit: Payer: Self-pay | Admitting: Cardiovascular Disease

## 2020-11-28 ENCOUNTER — Ambulatory Visit: Payer: Medicare Other | Admitting: Physician Assistant

## 2020-11-28 ENCOUNTER — Encounter: Payer: Self-pay | Admitting: Physician Assistant

## 2020-11-28 ENCOUNTER — Other Ambulatory Visit: Payer: Self-pay

## 2020-11-28 VITALS — BP 126/86 | HR 71 | Ht 67.0 in | Wt 112.6 lb

## 2020-11-28 DIAGNOSIS — I10 Essential (primary) hypertension: Secondary | ICD-10-CM

## 2020-11-28 DIAGNOSIS — I5181 Takotsubo syndrome: Secondary | ICD-10-CM

## 2020-11-28 MED ORDER — POTASSIUM CHLORIDE CRYS ER 20 MEQ PO TBCR: EXTENDED_RELEASE_TABLET | ORAL | 3 refills | Status: DC

## 2020-11-28 MED ORDER — BENAZEPRIL HCL 10 MG PO TABS: 10.0000 mg | ORAL_TABLET | Freq: Every day | ORAL | 3 refills | Status: DC

## 2020-11-28 MED ORDER — CARVEDILOL 6.25 MG PO TABS: 6.2500 mg | ORAL_TABLET | Freq: Two times a day (BID) | ORAL | 3 refills | Status: DC

## 2020-11-28 MED ORDER — NITROGLYCERIN 0.4 MG SL SUBL: SUBLINGUAL_TABLET | SUBLINGUAL | 1 refills | Status: DC

## 2020-11-28 MED ORDER — FUROSEMIDE 20 MG PO TABS: 20.0000 mg | ORAL_TABLET | Freq: Every day | ORAL | 3 refills | Status: DC

## 2020-11-28 NOTE — Progress Notes (Signed)
Cardiology Office Note:    Date:  11/28/2020   ID:  Kirsten Rice, DOB 05/04/1946, MRN [id number]  PCP:  Reynold Bowen, MD  [facility] Cardiologist:  Sherren Mocha, MD  [facility] Electrophysiologist:  None   Chief Complaint: yearly follow up   History of Present Illness:    Kirsten Rice is a 75 y.o. female with a hx of a hx of Takotsubo cardiomyopathy with recovered LVEF, HTN, hypothyroidism and chron's dx seen for follow up.    is a 75 y.o. female with a hx of Takotsubo cardiomyopathy, presenting for follow-up evaluation.  She initially presented in 2007 and had a recurrence in 2016.  She underwent cardiac catheterization most recently in 01/2015 demonstrating normal coronary arteries with LVEF 25 to 35% and a typical pattern of Takotsubo syndrome. Follow-up echo 02/2015  assessment showed normalization of LV function with an ejection fraction of 55-60%.  She was doing well on cardiac stand point when last seen by Dr.Cooper 11/2019.   Here today for follow up. She was 2.5 miles every day. Reports blood work last oct was normal by PCP. The patient denies nausea, vomiting, fever, chest pain, palpitations, shortness of breath, orthopnea, PND, dizziness, syncope, cough, congestion, abdominal pain, hematochezia, melena, lower extremity edema.  Past Medical History:  Diagnosis Date  . Arthritis    "probably a little bit in my knees and hips" (01/31/2015)  . Basal cell carcinoma   . Chronic systolic CHF (congestive heart failure) ([place])    a. 01/2015 Echo: EF 25-35% in setting of takotsubo cardiomyopathy.  . Crohn's disease ([place])   . Deep vein thrombosis ([place])    pt denies: 04/08/13  . Essential hypertension   . History of pneumonia    "2-3 times" (01/31/2015)  . Hyperthyroidism    pt denies:  04/08/13  . Mitral valve prolapse    a. 01/2015 Echo: Ef 25-35%, triv MR.  . Myocardial infarct ([place])   . Ocular headache    "weekly" (01/31/2015)  . Persistent dry cough   . PONV  (postoperative nausea and vomiting) 1970's   "when I had my appendix out"  . Raynaud's disease   . Squamous carcinoma   . Takotsubo cardiomyopathy    a. 2007 Cath: nl cors, apical ballooning w/ subsequent recovery of LV fxn;  b. 01/2015 NSTEMI/Cath: nl cors, EF 25-25% w/ apical ballooning;  c. 01/2015 echo: EF 30%, septal, apical, mid anterior, inf HK, mildly dil LA/RA.  Marland Kitchen Telangiectasia     Past Surgical History:  Procedure Laterality Date  . APPENDECTOMY  1971  . BASAL CELL CARCINOMA EXCISION     forehead, back, legs, arms" (01/31/2015)  . CARDIAC CATHETERIZATION  2006; 2007  . CARDIAC CATHETERIZATION N/A 01/31/2015   Procedure: Left Heart Cath and Coronary Angiography;  Surgeon: Jettie Booze, MD;  Location: [facility];  Service: Cardiovascular;  Laterality: N/A;  . COLONOSCOPY    . COLONOSCOPY  05/11/2012   Procedure: COLONOSCOPY;  Surgeon: Lafayette Dragon, MD;  [facility];  Service: Endoscopy;  Laterality: N/A;  . SHOULDER ARTHROSCOPY W/ ROTATOR CUFF REPAIR Right 1996  . SQUAMOUS CELL CARCINOMA EXCISION Left early 1980's   "eyelid"  . TUBAL LIGATION  1973    Current Medications: Current Meds  Medication Sig  . acetaminophen (TYLENOL) 325 MG tablet Take 650 mg by mouth every 6 (six) hours as needed for mild pain or headache.  Marland Kitchen aspirin 81 MG tablet Take 81 mg by mouth daily.  . balsalazide (  COLAZAL) 750 MG capsule Take 2,250 mg by mouth 3 (three) times daily.  . bifidobacterium infantis (ALIGN) capsule Take 1 capsule by mouth daily.  . Biotin 2500 MCG CAPS Take 2,500 mcg by mouth daily.   . Calcium-Vitamin D 600-200 MG-UNIT per tablet Take 1 tablet by mouth daily.   . Cholecalciferol (VITAMIN D3) 25 MCG (1000 UT) CAPS Take by mouth.  Marland Kitchen COVID-19 mRNA vaccine, Pfizer, 30 MCG/0.3ML injection Inject into the muscle.  . Flaxseed, Linseed, (FLAXSEED OIL PO) Take by mouth.  Marland Kitchen PRESCRIPTION MEDICATION Apply 1 application topically daily as needed (rosasea).  Finacea Cream, uses PRN  . vitamin B-12 (CYANOCOBALAMIN) 1000 MCG tablet Take 1,000 mcg by mouth daily.  . vitamin C (ASCORBIC ACID) 500 MG tablet Take 500 mg by mouth daily.  . vitamin E 180 MG (400 UNITS) capsule Take 1 capsule by mouth daily.  . [DISCONTINUED] benazepril (LOTENSIN) 10 MG tablet Take 1 tablet (10 mg total) by mouth daily.  . [DISCONTINUED] carvedilol (COREG) 6.25 MG tablet Take 1 tablet (6.25 mg total) by mouth 2 (two) times daily with a meal.  . [DISCONTINUED] furosemide (LASIX) 20 MG tablet Take 1 tablet (20 mg total) by mouth daily.  . [DISCONTINUED] nitroGLYCERIN (NITROSTAT) 0.4 MG SL tablet DISSOLVE 1 TABLET UNDER THE TONGUE EVERY 5 MINUTES FOR UP TO 3 DOSES AS NEEDED FOR CHEST PAIN  . [DISCONTINUED] potassium chloride SA (KLOR-CON) 20 MEQ tablet TAKE 1 TABLET(20 MEQ) BY MOUTH DAILY     Allergies:   Cafergot [ergotamine-caffeine], Compazine [prochlorperazine edisylate], Ergotamine-caffeine, Ergotamine-caffeine, Prochlorperazine, Prochlorperazine edisylate, Tetanus-diphtheria toxoids td, Other, and Tetanus toxoid adsorbed   Social History   Socioeconomic History  . Marital status: Widowed    Spouse name: Not on file  . Number of children: Not on file  . Years of education: Not on file  . Highest education level: Not on file  Occupational History  . Occupation: Retired    Comment: Marine scientist  Tobacco Use  . Smoking status: Never Smoker  . Smokeless tobacco: Never Used  Vaping Use  . Vaping Use: Never used  Substance and Sexual Activity  . Alcohol use: Yes    Comment: daily  . Drug use: No  . Sexual activity: Not on file  Other Topics Concern  . Not on file  Social History Narrative  . Not on file   Social Determinants of Health   Financial Resource Strain: Not on file  Food Insecurity: Not on file  Transportation Needs: Not on file  Physical Activity: Not on file  Stress: Not on file  Social Connections: Not on file     Family History: The patient's  family history includes Aortic stenosis in her father; Diabetes in her sister and sister; Hypertension in her father and mother; Hyperthyroidism in her sister, sister, sister, and sister. There is no history of Colon cancer or Breast cancer.   ROS:   Please see the history of present illness.    All other systems reviewed and are negative.  EKGs/Labs/Other Studies Reviewed:    The following studies were reviewed today:  Echo 02/2015 Study Conclusions   - Left ventricle: The cavity size was normal. Wall thickness was  normal. Systolic function was normal. The estimated ejection  fraction was in the range of 55% to 60%.  - Mitral valve: There was mild regurgitation.   Echo 01/2015 Study Conclusions   - Left ventricle: Septal, apical mid anterior and inferior  hypokinesis. Basal function presereved The cavity size was  severely dilated. Wall thickness was normal. The estimated  ejection fraction was 30%.  - Left atrium: The atrium was mildly dilated.  - Right atrium: The atrium was mildly dilated.  - Atrial septum: No defect or patent foramen ovale was identified  Left Heart Cath and Coronary Angiography  01/2015   Conclusion   There is severe left ventricular systolic dysfunction in a pattern of stress induced/Takatsubo cardiomyopathy.  No significant CAD.   Continue medical therapy for left ventricular dysfunction.     EKG:  EKG is  ordered today.  The ekg ordered today demonstrates NSR  Recent Labs: No results found for requested labs within last 8760 hours.  Recent Lipid Panel    Component Value Date/Time   CHOL 158 02/01/2015 0332   TRIG 65 02/01/2015 0332   HDL 66 02/01/2015 0332   CHOLHDL 2.4 02/01/2015 0332   VLDL 13 02/01/2015 0332   LDLCALC 79 02/01/2015 0332    Physical Exam:    VS:  BP 126/86   Pulse 71   Ht 5\' 7"  (1.702 m)   Wt 112 lb 9.6 oz (51.1 kg)   BMI 17.64 kg/m     Wt Readings from Last 3 Encounters:  11/28/20 112 lb 9.6 oz  (51.1 kg)  11/21/19 113 lb (51.3 kg)  11/16/18 108 lb (49 kg)     GEN: Well nourished, well developed in no acute distress HEENT: Normal NECK: No JVD; No carotid bruits LYMPHATICS: No lymphadenopathy CARDIAC: RRR, no murmurs, rubs, gallops RESPIRATORY:  Clear to auscultation without rales, wheezing or rhonchi  ABDOMEN: Soft, non-tender, non-distended MUSCULOSKELETAL:  No edema; No deformity  SKIN: Warm and dry NEUROLOGIC:  Alert and oriented x 3 PSYCHIATRIC:  Normal affect   ASSESSMENT AND PLAN:    1. Hx of Takotsubo Cardiomyopathy - LVEF normalized by last echo 02/2015. Euvolemic. Continue Benezepril, Coreg and Lasix. Reports normal blood work PCP 04/2020.  2. HTN - BP stable and well controlled on current medications.   Medication Adjustments/Labs and Tests Ordered: Current medicines are reviewed at length with the patient today.  Concerns regarding medicines are outlined above.  Orders Placed This Encounter  Procedures  . EKG 12-Lead   Meds ordered this encounter  Medications  . benazepril (LOTENSIN) 10 MG tablet    Sig: Take 1 tablet (10 mg total) by mouth daily.    Dispense:  90 tablet    Refill:  3  . furosemide (LASIX) 20 MG tablet    Sig: Take 1 tablet (20 mg total) by mouth daily.    Dispense:  90 tablet    Refill:  3  . carvedilol (COREG) 6.25 MG tablet    Sig: Take 1 tablet (6.25 mg total) by mouth 2 (two) times daily with a meal.    Dispense:  180 tablet    Refill:  3  . nitroGLYCERIN (NITROSTAT) 0.4 MG SL tablet    Sig: DISSOLVE 1 TABLET UNDER THE TONGUE EVERY 5 MINUTES FOR UP TO 3 DOSES AS NEEDED FOR CHEST PAIN    Dispense:  75 tablet    Refill:  1    **Patient requests 90 days supply**  . potassium chloride SA (KLOR-CON) 20 MEQ tablet    Sig: TAKE 1 TABLET(20 MEQ) BY MOUTH DAILY    Dispense:  90 tablet    Refill:  3    Patient Instructions  Medication Instructions:  Your physician recommends that you continue on your current medications as  directed. Please refer to the Current  Medication list given to you today.  *If you need a refill on your cardiac medications before your next appointment, please call your pharmacy*   Lab Work: None ordered  If you have labs (blood work) drawn today and your tests are completely normal, you will receive your results only by: Marland Kitchen MyChart Message (if you have MyChart) OR . A paper copy in the mail If you have any lab test that is abnormal or we need to change your treatment, we will call you to review the results.   Testing/Procedures: None ordered   Follow-Up: At [facility], you and your health needs are our priority.  As part of our continuing mission to provide you with exceptional heart care, we have created designated Provider Care Teams.  These Care Teams include your primary Cardiologist (physician) and Advanced Practice Providers (APPs -  Physician Assistants and Nurse Practitioners) who all work together to provide you with the care you need, when you need it.  We recommend signing up for the patient portal called "MyChart".  Sign up information is provided on this After Visit Summary.  MyChart is used to connect with patients for Virtual Visits (Telemedicine).  Patients are able to view lab/test results, encounter notes, upcoming appointments, etc.  Non-urgent messages can be sent to your provider as well.   To learn more about what you can do with MyChart, go to [email or web address].    Your next appointment:   12 month(s)  The format for your next appointment:   In Person  Provider:   You may see Sherren Mocha, MD or one of the following Advanced Practice Providers on your designated Care Team:    Richardson Dopp, PA-C  Robbie Lis, PA-C    Other Instructions      Signed, Leanor Kail, [place]  11/28/2020 2:16 PM    [place]

## 2020-11-28 NOTE — Patient Instructions (Addendum)

## 2020-12-06 ENCOUNTER — Other Ambulatory Visit: Payer: Self-pay | Admitting: Cardiovascular Disease

## 2020-12-10 DIAGNOSIS — N390 Urinary tract infection, site not specified: Secondary | ICD-10-CM | POA: Diagnosis not present

## 2020-12-10 DIAGNOSIS — N281 Cyst of kidney, acquired: Secondary | ICD-10-CM | POA: Diagnosis not present

## 2021-01-01 DIAGNOSIS — K5900 Constipation, unspecified: Secondary | ICD-10-CM | POA: Diagnosis not present

## 2021-01-01 DIAGNOSIS — Z79899 Other long term (current) drug therapy: Secondary | ICD-10-CM | POA: Diagnosis not present

## 2021-01-01 DIAGNOSIS — K5010 Crohn's disease of large intestine without complications: Secondary | ICD-10-CM | POA: Diagnosis not present

## 2021-01-04 ENCOUNTER — Other Ambulatory Visit: Payer: Self-pay | Admitting: Endocrinology

## 2021-01-04 DIAGNOSIS — Z1231 Encounter for screening mammogram for malignant neoplasm of breast: Secondary | ICD-10-CM

## 2021-01-19 ENCOUNTER — Emergency Department (HOSPITAL_BASED_OUTPATIENT_CLINIC_OR_DEPARTMENT_OTHER)
Admission: EM | Admit: 2021-01-19 | Discharge: 2021-01-19 | Disposition: A | Discharge status: Home / Self Care | Payer: Medicare Other | Attending: Emergency Medicine | Admitting: Emergency Medicine

## 2021-01-19 ENCOUNTER — Encounter (HOSPITAL_BASED_OUTPATIENT_CLINIC_OR_DEPARTMENT_OTHER): Payer: Self-pay | Admitting: *Deleted

## 2021-01-19 ENCOUNTER — Emergency Department (HOSPITAL_BASED_OUTPATIENT_CLINIC_OR_DEPARTMENT_OTHER): Payer: Medicare Other | Admitting: Radiology

## 2021-01-19 ENCOUNTER — Other Ambulatory Visit: Payer: Self-pay

## 2021-01-19 DIAGNOSIS — S8261XA Displaced fracture of lateral malleolus of right fibula, initial encounter for closed fracture: Secondary | ICD-10-CM | POA: Insufficient documentation

## 2021-01-19 DIAGNOSIS — M25571 Pain in right ankle and joints of right foot: Secondary | ICD-10-CM

## 2021-01-19 DIAGNOSIS — Z79899 Other long term (current) drug therapy: Secondary | ICD-10-CM | POA: Diagnosis not present

## 2021-01-19 DIAGNOSIS — M7989 Other specified soft tissue disorders: Secondary | ICD-10-CM | POA: Diagnosis not present

## 2021-01-19 DIAGNOSIS — Z7982 Long term (current) use of aspirin: Secondary | ICD-10-CM | POA: Diagnosis not present

## 2021-01-19 DIAGNOSIS — S99911A Unspecified injury of right ankle, initial encounter: Secondary | ICD-10-CM | POA: Diagnosis present

## 2021-01-19 DIAGNOSIS — I110 Hypertensive heart disease with heart failure: Secondary | ICD-10-CM | POA: Insufficient documentation

## 2021-01-19 DIAGNOSIS — Z85828 Personal history of other malignant neoplasm of skin: Secondary | ICD-10-CM | POA: Diagnosis not present

## 2021-01-19 DIAGNOSIS — M79671 Pain in right foot: Secondary | ICD-10-CM | POA: Diagnosis not present

## 2021-01-19 DIAGNOSIS — Y9389 Activity, other specified: Secondary | ICD-10-CM | POA: Diagnosis not present

## 2021-01-19 DIAGNOSIS — Y92511 Restaurant or cafe as the place of occurrence of the external cause: Secondary | ICD-10-CM | POA: Diagnosis not present

## 2021-01-19 DIAGNOSIS — I5032 Chronic diastolic (congestive) heart failure: Secondary | ICD-10-CM | POA: Insufficient documentation

## 2021-01-19 DIAGNOSIS — X501XXA Overexertion from prolonged static or awkward postures, initial encounter: Secondary | ICD-10-CM | POA: Diagnosis not present

## 2021-01-19 DIAGNOSIS — T1490XA Injury, unspecified, initial encounter: Secondary | ICD-10-CM

## 2021-01-19 NOTE — ED Provider Notes (Signed)
[facility] EMERGENCY DEPT Provider Note   CSN: [id number] Arrival date & time: 01/19/21  1523     History Chief Complaint  Patient presents with   Ankle Pain    Kirsten Rice is a 75 y.o. female.  She is a very active 75 year old.  She said she was eating lunch and when she stood up her foot had fallen asleep and she twisted her right ankle.  She did not fall to the ground.  Complaining of right lateral ankle pain when she ambulates.  No numbness.  She has seen orthopedics Dr. Lucia Gaskins in the past.  The history is provided by the patient.  Ankle Pain Location:  Ankle Time since incident:  4 hours Injury: yes   Mechanism of injury comment:  Twisted Ankle location:  R ankle Pain details:    Quality:  Shooting   Severity:  Moderate   Onset quality:  Sudden   Duration:  4 hours   Progression:  Unchanged Chronicity:  New Dislocation: no   Foreign body present:  No foreign bodies Relieved by:  Elevation and ice Worsened by:  Bearing weight Ineffective treatments:  None tried Associated symptoms: swelling   Associated symptoms: no fever, no muscle weakness, no neck pain, no numbness and no tingling       Past Medical History:  Diagnosis Date   Arthritis    "probably a little bit in my knees and hips" (01/31/2015)   Basal cell carcinoma    Chronic systolic CHF (congestive heart failure) ([place])    a. 01/2015 Echo: EF 25-35% in setting of takotsubo cardiomyopathy.   Crohn's disease ([place])    Deep vein thrombosis ([place])    pt denies: 04/08/13   Essential hypertension    History of pneumonia    "2-3 times" (01/31/2015)   Hyperthyroidism    pt denies:  04/08/13   Mitral valve prolapse    a. 01/2015 Echo: Ef 25-35%, triv MR.   Myocardial infarct [facility])    Ocular headache    "weekly" (01/31/2015)   Persistent dry cough    PONV (postoperative nausea and vomiting) 1970's   "when I had my appendix out"   Raynaud's disease    Squamous carcinoma    Takotsubo cardiomyopathy     a. 2007 Cath: nl cors, apical ballooning w/ subsequent recovery of LV fxn;  b. 01/2015 NSTEMI/Cath: nl cors, EF 25-25% w/ apical ballooning;  c. 01/2015 echo: EF 30%, septal, apical, mid anterior, inf HK, mildly dil LA/RA.   Telangiectasia     Patient Active Problem List   Diagnosis Date Noted   Migraine with aura and without status migrainosus, not intractable 07/08/2016   Benign essential HTN    Heart murmur    Cough    Chronic systolic CHF (congestive heart failure) ([place])    Chronic lung disease 02/02/2015   History of MI (myocardial infarction) 01/31/2015   Crohn's disease with complication ([place]) 88/41/6606   Primary osteoarthritis of left knee 11/13/2014   Arthritis of left knee 11/08/2014   Bronchiectasis without complication ([place]) 30/16/0109   Left knee pain 08/23/2014   Chronic night sweats 08/14/2014   Raynaud phenomenon 07/03/2014   Pulmonary infiltrate 07/03/2014   Unexplained night sweats 07/03/2014   Weight loss 07/03/2014   Chronic cough 07/03/2014   Pulmonary nodules 06/05/2014   Renal cyst 06/05/2014   Atheroma, skin 05/03/2014   Flank pain 03/06/2014   Difficult or painful urination 01/18/2014   Crohn's colitis ([place]) 11/15/2013   Abnormal chest x-ray  08/29/2013   Febrile 08/29/2013   Influenza with respiratory manifestation other than pneumonia 08/29/2013   Major depressive disorder, single episode, moderate ([place]) 05/23/2013   Ocular migraine 03/30/2013   Calcium blood increased 03/17/2013   Benign essential tremor 03/11/2013   Acne erythematosa 05/17/2012   H/O acute myocardial infarction 05/17/2012   Blepharitis 04/22/2012   Underimmunization status 04/09/2012   Bilateral dry eyes 12/12/2011   Dermatochalasis 12/12/2011   Nuclear cataract 12/12/2011   Colitis gravis ([place]) 05/13/2011   Health examination of defined subpopulation 04/29/2011   HLD (hyperlipidemia) 10/21/2010   Stress-induced cardiomyopathy- new drop in EF- 25-35% 05/17/2010   Takotsubo  cardiomyopathy 04/30/2009   MITRAL VALVE PROLAPSE 04/27/2009   TELANGIECTASIA 04/27/2009   Allergic rhinitis, seasonal 04/27/2009   DEEP VENOUS THROMBOPHLEBITIS, HX OF 04/27/2009   RAYNAUD'S SYNDROME, HX OF 04/27/2009   HYPERTHYROIDISM 02/21/2008   SORE THROAT 02/21/2008   GERD 02/21/2008    Past Surgical History:  Procedure Laterality Date   APPENDECTOMY  1971   BASAL CELL CARCINOMA EXCISION     forehead, back, legs, arms" (01/31/2015)   CARDIAC CATHETERIZATION  2006; 2007   CARDIAC CATHETERIZATION N/A 01/31/2015   Procedure: Left Heart Cath and Coronary Angiography;  Surgeon: Jettie Booze, MD;  Location: [facility];  Service: Cardiovascular;  Laterality: N/A;   COLONOSCOPY     COLONOSCOPY  05/11/2012   Procedure: COLONOSCOPY;  Surgeon: Lafayette Dragon, MD;  Location: [facility];  Service: Endoscopy;  Laterality: N/A;   SHOULDER ARTHROSCOPY W/ ROTATOR CUFF REPAIR Right 1996   SQUAMOUS CELL CARCINOMA EXCISION Left early 1980's   "eyelid"   TUBAL LIGATION  1973     OB History     Gravida  1   Para  1   Term      Preterm      AB      Living         SAB      IAB      Ectopic      Multiple      Live Births              Family History  Problem Relation Age of Onset   Aortic stenosis Father    Hypertension Father    Diabetes Sister    Hyperthyroidism Sister    Hypertension Mother    Diabetes Sister    Hyperthyroidism Sister    Hyperthyroidism Sister    Hyperthyroidism Sister    Colon cancer Neg Hx    Breast cancer Neg Hx     Social History   Tobacco Use   Smoking status: Never   Smokeless tobacco: Never  Vaping Use   Vaping Use: Never used  Substance Use Topics   Alcohol use: Yes    Alcohol/week: 1.0 standard drink    Types: 1 Glasses of wine per week    Comment: daily   Drug use: No    Home Medications Prior to Admission medications   Medication Sig Start Date End Date Taking? Authorizing Provider  aspirin 81 MG  tablet Take 81 mg by mouth daily.   Yes [provider]  balsalazide (COLAZAL) 750 MG capsule Take 2,250 mg by mouth 3 (three) times daily. 07/27/13  Yes Lafayette Dragon, MD  benazepril (LOTENSIN) 10 MG tablet Take 1 tablet (10 mg total) by mouth daily. 11/28/20 11/23/21 Yes Bhagat, Bhavinkumar, PA  bifidobacterium infantis (ALIGN) capsule Take 1 capsule by mouth daily.   Yes [provider]  Biotin 2500 MCG CAPS Take 2,500 mcg by mouth daily.    Yes [provider]  Calcium-Vitamin D 600-200 MG-UNIT per tablet Take 1 tablet by mouth daily.  02/01/13  Yes Lafayette Dragon, MD  carvedilol (COREG) 6.25 MG tablet Take 1 tablet (6.25 mg total) by mouth 2 (two) times daily with a meal. 11/28/20 11/23/21 Yes Bhagat, Bhavinkumar, PA  Cholecalciferol (VITAMIN D3) 25 MCG (1000 UT) CAPS Take by mouth.   Yes [provider]  Flaxseed, Linseed, (FLAXSEED OIL PO) Take by mouth.   Yes [provider]  furosemide (LASIX) 20 MG tablet Take 1 tablet (20 mg total) by mouth daily. 11/28/20 11/23/21 Yes Bhagat, Bhavinkumar, PA  potassium chloride SA (KLOR-CON) 20 MEQ tablet TAKE 1 TABLET(20 MEQ) BY MOUTH DAILY 11/28/20  Yes Bhagat, Bhavinkumar, PA  vitamin B-12 (CYANOCOBALAMIN) 1000 MCG tablet Take 1,000 mcg by mouth daily.   Yes [provider]  vitamin C (ASCORBIC ACID) 500 MG tablet Take 500 mg by mouth daily.   Yes [provider]  vitamin E 180 MG (400 UNITS) capsule Take 1 capsule by mouth daily.   Yes [provider]  acetaminophen (TYLENOL) 325 MG tablet Take 650 mg by mouth every 6 (six) hours as needed for mild pain or headache.    [provider]  COVID-19 mRNA vaccine, Pfizer, 30 MCG/0.3ML injection Inject into the muscle. 11/02/20   Carlyle Basques, MD  nitroGLYCERIN (NITROSTAT) 0.4 MG SL tablet DISSOLVE 1 TABLET UNDER THE TONGUE EVERY 5 MINUTES FOR UP TO 3 DOSES AS NEEDED FOR CHEST PAIN 11/28/20   Leanor Kail, PA  PRESCRIPTION  MEDICATION Apply 1 application topically daily as needed (rosasea). Finacea Cream, uses PRN    [provider]    Allergies    Cafergot [ergotamine-caffeine], Compazine [prochlorperazine edisylate], Ergotamine-caffeine, Ergotamine-caffeine, Prochlorperazine, Prochlorperazine edisylate, Tetanus-diphtheria toxoids td, Other, and Tetanus toxoid adsorbed  Review of Systems   Review of Systems  Constitutional:  Negative for fever.  Musculoskeletal:  Negative for neck pain.  Skin:  Negative for wound.  Neurological:  Negative for weakness and numbness.   Physical Exam Updated Vital Signs BP (!) 205/112   Pulse 70   Temp 97.9 F (36.6 C)   Resp 16   Ht 5\' 7"  (1.702 m)   Wt 51.3 kg   SpO2 98%   BMI 17.70 kg/m   Physical Exam Constitutional:      Appearance: She is well-developed.  HENT:     Head: Normocephalic and atraumatic.  Eyes:     Conjunctiva/sclera: Conjunctivae normal.  Musculoskeletal:        General: Swelling and tenderness present.     Cervical back: Neck supple.     Comments: She is swollen over her right lateral ankle.  Hip knee and calcaneus nontender.  Normal tenderness fifth metatarsal base.  Tender over lateral malleolus.  No medial malleolar tenderness.  Distal pulses motor and sensation intact.  Other extremities unaffected  Skin:    General: Skin is warm and dry.  Neurological:     General: No focal deficit present.     Mental Status: She is alert.     GCS: GCS eye subscore is 4. GCS verbal subscore is 5. GCS motor subscore is 6.     Sensory: No sensory deficit.     Motor: No weakness.    ED Results / Procedures / Treatments   Labs (all labs ordered are listed, but only abnormal results are displayed) Labs Reviewed -  No data to display  EKG None  Radiology DG Ankle Complete Right  Result Date: 01/19/2021 CLINICAL DATA:  Twisted ankle.  Pain. EXAM: RIGHT ANKLE - COMPLETE 3+ VIEW COMPARISON:  None. FINDINGS: There is an acute fracture of the  LATERAL malleolus, associated with moderate soft tissue swelling and ankle effusion. Mortise is intact. MEDIAL malleolus is unremarkable. IMPRESSION: Acute LATERAL malleolar fracture. Electronically Signed   By: Nolon Nations M.D.   On: 01/19/2021 16:39   DG Foot Complete Right  Result Date: 01/19/2021 CLINICAL DATA:  Twisted RIGHT ankle and foot.  Pain. EXAM: RIGHT FOOT COMPLETE - 3+ VIEW COMPARISON:  None. FINDINGS: There is hallux valgus deformity. No acute fracture or subluxation. No radiopaque foreign body or soft tissue gas. Ankle effusion noted. IMPRESSION: Ankle effusion. Electronically Signed   By: Nolon Nations M.D.   On: 01/19/2021 16:38    Procedures Procedures   Medications Ordered in ED Medications - No data to display  ED Course  I have reviewed the triage vital signs and the nursing notes.  Pertinent labs & imaging results that were available during my care of the patient were reviewed by me and considered in my medical decision making (see chart for details).  Clinical Course as of 01/20/21 0908  Sat Jan 19, 2021  1713 X-ray shows an acute lateral malleolar avulsion fracture. [MB]  2542 Discussed with orthopedics Dr. Berenice Primas.  He thought a cam walker boot and weightbearing as tolerated.  Follow-up in the office next Tuesday or Thursday.  Reviewed with patient and all questions answered. [MB]    Clinical Course User Index [MB] Hayden Rasmussen, MD   MDM Rules/Calculators/A&P                         Differential diagnosis includes fracture, dislocation, sprain.  X-ray ordered and interpreted by me as lateral malleolar avulsion fracture.  Orthopedics consulted  Final Clinical Impression(s) / ED Diagnoses Final diagnoses:  Closed low lateral malleolus fracture, right, initial encounter    Rx / DC Orders ED Discharge Orders     None        Hayden Rasmussen, MD 01/20/21 [id number]

## 2021-01-19 NOTE — Discharge Instructions (Addendum)
You were seen in the emergency department for right ankle pain after twisting it.  You have bilateral malleolar fracture of your ankle.  Orthopedics is recommending a cam walker boot while you are ambulating.  Ice to the affected area.  Follow-up in the office this Tuesday or Thursday.

## 2021-01-19 NOTE — ED Triage Notes (Signed)
Twisted her right ankle today at the restaurant.  Pt is ambulatory to triage.

## 2021-01-23 DIAGNOSIS — S93401A Sprain of unspecified ligament of right ankle, initial encounter: Secondary | ICD-10-CM | POA: Diagnosis not present

## 2021-02-18 ENCOUNTER — Ambulatory Visit: Payer: Medicare Other

## 2021-02-22 DIAGNOSIS — S93401A Sprain of unspecified ligament of right ankle, initial encounter: Secondary | ICD-10-CM | POA: Diagnosis not present

## 2021-02-23 DIAGNOSIS — L309 Dermatitis, unspecified: Secondary | ICD-10-CM | POA: Diagnosis not present

## 2021-02-27 DIAGNOSIS — Z85828 Personal history of other malignant neoplasm of skin: Secondary | ICD-10-CM | POA: Diagnosis not present

## 2021-02-27 DIAGNOSIS — L738 Other specified follicular disorders: Secondary | ICD-10-CM | POA: Diagnosis not present

## 2021-03-04 DIAGNOSIS — K5900 Constipation, unspecified: Secondary | ICD-10-CM | POA: Diagnosis not present

## 2021-03-04 DIAGNOSIS — K6289 Other specified diseases of anus and rectum: Secondary | ICD-10-CM | POA: Diagnosis not present

## 2021-03-07 DIAGNOSIS — Z124 Encounter for screening for malignant neoplasm of cervix: Secondary | ICD-10-CM | POA: Diagnosis not present

## 2021-03-07 DIAGNOSIS — Z01419 Encounter for gynecological examination (general) (routine) without abnormal findings: Secondary | ICD-10-CM | POA: Diagnosis not present

## 2021-03-07 DIAGNOSIS — Z681 Body mass index (BMI) 19 or less, adult: Secondary | ICD-10-CM | POA: Diagnosis not present

## 2021-03-12 DIAGNOSIS — M816 Localized osteoporosis [Lequesne]: Secondary | ICD-10-CM | POA: Diagnosis not present

## 2021-03-12 DIAGNOSIS — N958 Other specified menopausal and perimenopausal disorders: Secondary | ICD-10-CM | POA: Diagnosis not present

## 2021-03-14 ENCOUNTER — Ambulatory Visit
Admission: RE | Admit: 2021-03-14 | Discharge: 2021-03-14 | Disposition: A | Discharge status: Home / Self Care | Payer: Medicare Other | Source: Ambulatory Visit | Attending: Endocrinology | Admitting: Endocrinology

## 2021-03-14 ENCOUNTER — Other Ambulatory Visit: Payer: Self-pay

## 2021-03-14 DIAGNOSIS — Z1231 Encounter for screening mammogram for malignant neoplasm of breast: Secondary | ICD-10-CM | POA: Diagnosis not present

## 2021-03-18 DIAGNOSIS — R2689 Other abnormalities of gait and mobility: Secondary | ICD-10-CM | POA: Diagnosis not present

## 2021-03-18 DIAGNOSIS — M79671 Pain in right foot: Secondary | ICD-10-CM | POA: Diagnosis not present

## 2021-03-18 DIAGNOSIS — M25571 Pain in right ankle and joints of right foot: Secondary | ICD-10-CM | POA: Diagnosis not present

## 2021-04-03 DIAGNOSIS — M79671 Pain in right foot: Secondary | ICD-10-CM | POA: Diagnosis not present

## 2021-04-03 DIAGNOSIS — R2689 Other abnormalities of gait and mobility: Secondary | ICD-10-CM | POA: Diagnosis not present

## 2021-04-03 DIAGNOSIS — M25571 Pain in right ankle and joints of right foot: Secondary | ICD-10-CM | POA: Diagnosis not present

## 2021-04-09 ENCOUNTER — Ambulatory Visit: Payer: Medicare Other

## 2021-04-10 ENCOUNTER — Other Ambulatory Visit (HOSPITAL_BASED_OUTPATIENT_CLINIC_OR_DEPARTMENT_OTHER): Payer: Self-pay

## 2021-04-10 MED ORDER — INFLUENZA VAC A&B SA ADJ QUAD 0.5 ML IM PRSY
PREFILLED_SYRINGE | INTRAMUSCULAR | 0 refills | Status: AC
  Filled 2021-04-10: qty 0.5, 1d supply, fill #0

## 2021-04-24 DIAGNOSIS — R2689 Other abnormalities of gait and mobility: Secondary | ICD-10-CM | POA: Diagnosis not present

## 2021-04-24 DIAGNOSIS — R531 Weakness: Secondary | ICD-10-CM | POA: Diagnosis not present

## 2021-04-25 DIAGNOSIS — Z85828 Personal history of other malignant neoplasm of skin: Secondary | ICD-10-CM | POA: Diagnosis not present

## 2021-04-25 DIAGNOSIS — L821 Other seborrheic keratosis: Secondary | ICD-10-CM | POA: Diagnosis not present

## 2021-04-25 DIAGNOSIS — L820 Inflamed seborrheic keratosis: Secondary | ICD-10-CM | POA: Diagnosis not present

## 2021-04-25 DIAGNOSIS — L812 Freckles: Secondary | ICD-10-CM | POA: Diagnosis not present

## 2021-05-01 DIAGNOSIS — E785 Hyperlipidemia, unspecified: Secondary | ICD-10-CM | POA: Diagnosis not present

## 2021-05-01 DIAGNOSIS — I10 Essential (primary) hypertension: Secondary | ICD-10-CM | POA: Diagnosis not present

## 2021-05-01 DIAGNOSIS — R2689 Other abnormalities of gait and mobility: Secondary | ICD-10-CM | POA: Diagnosis not present

## 2021-05-01 DIAGNOSIS — R531 Weakness: Secondary | ICD-10-CM | POA: Diagnosis not present

## 2021-05-01 DIAGNOSIS — E538 Deficiency of other specified B group vitamins: Secondary | ICD-10-CM | POA: Diagnosis not present

## 2021-05-01 DIAGNOSIS — M859 Disorder of bone density and structure, unspecified: Secondary | ICD-10-CM | POA: Diagnosis not present

## 2021-05-08 DIAGNOSIS — I10 Essential (primary) hypertension: Secondary | ICD-10-CM | POA: Diagnosis not present

## 2021-05-08 DIAGNOSIS — R82998 Other abnormal findings in urine: Secondary | ICD-10-CM | POA: Diagnosis not present

## 2021-05-09 DIAGNOSIS — R2689 Other abnormalities of gait and mobility: Secondary | ICD-10-CM | POA: Diagnosis not present

## 2021-05-09 DIAGNOSIS — R531 Weakness: Secondary | ICD-10-CM | POA: Diagnosis not present

## 2021-05-22 ENCOUNTER — Encounter (HOSPITAL_COMMUNITY): Payer: Medicare Other

## 2021-05-23 DIAGNOSIS — R2689 Other abnormalities of gait and mobility: Secondary | ICD-10-CM | POA: Diagnosis not present

## 2021-05-23 DIAGNOSIS — R531 Weakness: Secondary | ICD-10-CM | POA: Diagnosis not present

## 2021-05-24 ENCOUNTER — Other Ambulatory Visit (HOSPITAL_COMMUNITY): Payer: Self-pay

## 2021-05-27 ENCOUNTER — Other Ambulatory Visit: Payer: Self-pay

## 2021-05-27 ENCOUNTER — Ambulatory Visit (HOSPITAL_COMMUNITY)
Admission: RE | Admit: 2021-05-27 | Discharge: 2021-05-27 | Disposition: A | Discharge status: Home / Self Care | Payer: Medicare Other | Source: Ambulatory Visit | Attending: Endocrinology | Admitting: Endocrinology

## 2021-05-27 DIAGNOSIS — M810 Age-related osteoporosis without current pathological fracture: Secondary | ICD-10-CM | POA: Diagnosis not present

## 2021-05-27 MED ORDER — DENOSUMAB 60 MG/ML ~~LOC~~ SOSY
PREFILLED_SYRINGE | SUBCUTANEOUS | Status: AC
  Administered 2021-05-27: 60 mg
  Filled 2021-05-27: qty 1

## 2021-06-03 DIAGNOSIS — R2689 Other abnormalities of gait and mobility: Secondary | ICD-10-CM | POA: Diagnosis not present

## 2021-06-03 DIAGNOSIS — R531 Weakness: Secondary | ICD-10-CM | POA: Diagnosis not present

## 2021-06-10 DIAGNOSIS — R531 Weakness: Secondary | ICD-10-CM | POA: Diagnosis not present

## 2021-06-10 DIAGNOSIS — R2689 Other abnormalities of gait and mobility: Secondary | ICD-10-CM | POA: Diagnosis not present

## 2021-06-17 DIAGNOSIS — R531 Weakness: Secondary | ICD-10-CM | POA: Diagnosis not present

## 2021-06-17 DIAGNOSIS — R2689 Other abnormalities of gait and mobility: Secondary | ICD-10-CM | POA: Diagnosis not present

## 2021-06-20 DIAGNOSIS — N281 Cyst of kidney, acquired: Secondary | ICD-10-CM | POA: Diagnosis not present

## 2021-06-24 DIAGNOSIS — R531 Weakness: Secondary | ICD-10-CM | POA: Diagnosis not present

## 2021-06-24 DIAGNOSIS — R2689 Other abnormalities of gait and mobility: Secondary | ICD-10-CM | POA: Diagnosis not present

## 2021-08-23 DIAGNOSIS — H5213 Myopia, bilateral: Secondary | ICD-10-CM | POA: Diagnosis not present

## 2021-09-05 DIAGNOSIS — K5900 Constipation, unspecified: Secondary | ICD-10-CM | POA: Diagnosis not present

## 2021-09-05 DIAGNOSIS — K5010 Crohn's disease of large intestine without complications: Secondary | ICD-10-CM | POA: Diagnosis not present

## 2021-09-09 DIAGNOSIS — M79642 Pain in left hand: Secondary | ICD-10-CM | POA: Diagnosis not present

## 2021-09-16 DIAGNOSIS — M79642 Pain in left hand: Secondary | ICD-10-CM | POA: Diagnosis not present

## 2021-09-26 DIAGNOSIS — S62307A Unspecified fracture of fifth metacarpal bone, left hand, initial encounter for closed fracture: Secondary | ICD-10-CM | POA: Diagnosis not present

## 2021-09-26 DIAGNOSIS — M79642 Pain in left hand: Secondary | ICD-10-CM | POA: Diagnosis not present

## 2021-09-27 DIAGNOSIS — M25642 Stiffness of left hand, not elsewhere classified: Secondary | ICD-10-CM | POA: Diagnosis not present

## 2021-10-17 DIAGNOSIS — S62307A Unspecified fracture of fifth metacarpal bone, left hand, initial encounter for closed fracture: Secondary | ICD-10-CM | POA: Diagnosis not present

## 2021-10-17 DIAGNOSIS — M79642 Pain in left hand: Secondary | ICD-10-CM | POA: Diagnosis not present

## 2021-10-31 DIAGNOSIS — I10 Essential (primary) hypertension: Secondary | ICD-10-CM | POA: Diagnosis not present

## 2021-10-31 DIAGNOSIS — I2510 Atherosclerotic heart disease of native coronary artery without angina pectoris: Secondary | ICD-10-CM | POA: Diagnosis not present

## 2021-10-31 DIAGNOSIS — E785 Hyperlipidemia, unspecified: Secondary | ICD-10-CM | POA: Diagnosis not present

## 2021-10-31 DIAGNOSIS — I700 Atherosclerosis of aorta: Secondary | ICD-10-CM | POA: Diagnosis not present

## 2021-11-14 DIAGNOSIS — M79642 Pain in left hand: Secondary | ICD-10-CM | POA: Diagnosis not present

## 2021-11-14 DIAGNOSIS — S62307A Unspecified fracture of fifth metacarpal bone, left hand, initial encounter for closed fracture: Secondary | ICD-10-CM | POA: Diagnosis not present

## 2021-11-22 ENCOUNTER — Other Ambulatory Visit: Payer: Self-pay | Admitting: Physician Assistant

## 2021-12-02 DIAGNOSIS — K5010 Crohn's disease of large intestine without complications: Secondary | ICD-10-CM | POA: Diagnosis not present

## 2021-12-02 DIAGNOSIS — Z8679 Personal history of other diseases of the circulatory system: Secondary | ICD-10-CM | POA: Diagnosis not present

## 2021-12-02 DIAGNOSIS — Z1211 Encounter for screening for malignant neoplasm of colon: Secondary | ICD-10-CM | POA: Diagnosis not present

## 2021-12-12 ENCOUNTER — Encounter: Payer: Self-pay | Admitting: Cardiovascular Disease

## 2021-12-12 ENCOUNTER — Ambulatory Visit: Payer: Medicare Other | Admitting: Cardiovascular Disease

## 2021-12-12 VITALS — BP 150/92 | HR 65 | Ht 67.0 in | Wt 116.2 lb

## 2021-12-12 DIAGNOSIS — I10 Essential (primary) hypertension: Secondary | ICD-10-CM

## 2021-12-12 DIAGNOSIS — I5181 Takotsubo syndrome: Secondary | ICD-10-CM | POA: Diagnosis not present

## 2021-12-12 MED ORDER — BENAZEPRIL HCL 10 MG PO TABS: 10.0000 mg | ORAL_TABLET | Freq: Every day | ORAL | 3 refills | Status: DC

## 2021-12-12 MED ORDER — CARVEDILOL 6.25 MG PO TABS: ORAL_TABLET | ORAL | 3 refills | Status: DC

## 2021-12-12 MED ORDER — POTASSIUM CHLORIDE CRYS ER 20 MEQ PO TBCR: EXTENDED_RELEASE_TABLET | ORAL | 3 refills | Status: DC

## 2021-12-12 MED ORDER — FUROSEMIDE 20 MG PO TABS: 20.0000 mg | ORAL_TABLET | Freq: Every day | ORAL | 3 refills | Status: DC

## 2021-12-12 NOTE — Progress Notes (Signed)
Cardiology Office Note:    Date:  12/12/2021   ID:  Kirsten Rice, DOB [birth year], MRN [id number]  PCP:  Reynold Bowen, MD   [facility] Providers Cardiologist:  Sherren Mocha, MD     Referring MD: Reynold Bowen, MD   Chief Complaint  Patient presents with   Hypertension    History of Present Illness:    Kirsten Rice is a 76 y.o. female with a hx of Takotsubo syndrome, presenting for follow-up evaluation.  Comorbidities include hypertension and hypothyroidism.  The patient initially presented in 2007 with acute Takotsubo syndrome and severe LV dysfunction.  She had a recurrence in 2016 when her LVEF was estimated at 25 to 35%.  Follow-up echocardiogram demonstrated improvement in LVEF of 55 to 60%.  The patient is here alone today.  She is doing well with no exertional chest pain or pressure.  She denies edema, orthopnea, or PND.  She does have occasional heart palpitations associated with pressure in her chest at nighttime when she lies down.  Past Medical History:  Diagnosis Date   Arthritis    "probably a little bit in my knees and hips" (01/31/2015)   Basal cell carcinoma    Chronic systolic CHF (congestive heart failure) ([place])    a. 01/2015 Echo: EF 25-35% in setting of takotsubo cardiomyopathy.   Crohn's disease ([place])    Deep vein thrombosis ([place])    pt denies: 04/08/13   Essential hypertension    History of pneumonia    "2-3 times" (01/31/2015)   Hyperthyroidism    pt denies:  04/08/13   Mitral valve prolapse    a. 01/2015 Echo: Ef 25-35%, triv MR.   Myocardial infarct [facility])    Ocular headache    "weekly" (01/31/2015)   Persistent dry cough    PONV (postoperative nausea and vomiting) 1970's   "when I had my appendix out"   Raynaud's disease    Squamous carcinoma    Takotsubo cardiomyopathy    a. 2007 Cath: nl cors, apical ballooning w/ subsequent recovery of LV fxn;  b. 01/2015 NSTEMI/Cath: nl cors, EF 25-25% w/ apical ballooning;  c. 01/2015 echo: EF 30%, septal,  apical, mid anterior, inf HK, mildly dil LA/RA.   Telangiectasia     Past Surgical History:  Procedure Laterality Date   APPENDECTOMY  1971   BASAL CELL CARCINOMA EXCISION     forehead, back, legs, arms" (01/31/2015)   CARDIAC CATHETERIZATION  2006; 2007   CARDIAC CATHETERIZATION N/A 01/31/2015   Procedure: Left Heart Cath and Coronary Angiography;  Surgeon: Jettie Booze, MD;  Location: [facility];  Service: Cardiovascular;  Laterality: N/A;   COLONOSCOPY     COLONOSCOPY  05/11/2012   Procedure: COLONOSCOPY;  Surgeon: Lafayette Dragon, MD;  [facility]: [facility];  Service: Endoscopy;  Laterality: N/A;   SHOULDER ARTHROSCOPY W/ ROTATOR CUFF REPAIR Right 1996   SQUAMOUS CELL CARCINOMA EXCISION Left early 1980's   "eyelid"   TUBAL LIGATION  1973    Current Medications: Current Meds  Medication Sig   acetaminophen (TYLENOL) 325 MG tablet Take 650 mg by mouth every 6 (six) hours as needed for mild pain or headache.   aspirin 81 MG tablet Take 81 mg by mouth daily.   balsalazide (COLAZAL) 750 MG capsule Take 2,250 mg by mouth 3 (three) times daily.   bifidobacterium infantis (ALIGN) capsule Take 1 capsule by mouth daily.   Biotin 2500 MCG CAPS Take 2,500 mcg by mouth daily.    Calcium-Vitamin  D 600-200 MG-UNIT per tablet Take 1 tablet by mouth daily.    Cholecalciferol (VITAMIN D3) 25 MCG (1000 UT) CAPS Take by mouth.   COVID-19 mRNA vaccine, Pfizer, 30 MCG/0.3ML injection Inject into the muscle.   cyclobenzaprine (FLEXERIL) 10 MG tablet Take 10 mg by mouth 2 (two) times daily as needed.   denosumab (PROLIA) 60 MG/ML SOSY injection Inject into the skin.   Flaxseed, Linseed, (FLAXSEED OIL PO) Take by mouth.   influenza vaccine adjuvanted (FLUAD) 0.5 ML injection Inject into the muscle.   montelukast (SINGULAIR) 10 MG tablet montelukast 10 mg tablet  TAKE 1 TABLET BY MOUTH EVERY DAY   nitroGLYCERIN (NITROSTAT) 0.4 MG SL tablet DISSOLVE 1 TABLET UNDER THE TONGUE EVERY 5  MINUTES FOR UP TO 3 DOSES AS NEEDED FOR CHEST PAIN   PRESCRIPTION MEDICATION Apply 1 application topically daily as needed (rosasea). Finacea Cream, uses PRN   vitamin B-12 (CYANOCOBALAMIN) 1000 MCG tablet Take 1,000 mcg by mouth daily.   vitamin C (ASCORBIC ACID) 500 MG tablet Take 500 mg by mouth daily.   [DISCONTINUED] benazepril (LOTENSIN) 10 MG tablet Take 1 tablet (10 mg total) by mouth daily.   [DISCONTINUED] carvedilol (COREG) 6.25 MG tablet TAKE 1 TABLET(6.25 MG) BY MOUTH TWICE DAILY WITH A MEAL   [DISCONTINUED] furosemide (LASIX) 20 MG tablet Take 1 tablet (20 mg total) by mouth daily.   [DISCONTINUED] potassium chloride SA (KLOR-CON) 20 MEQ tablet TAKE 1 TABLET(20 MEQ) BY MOUTH DAILY     Allergies:   Cafergot [ergotamine-caffeine], Compazine [prochlorperazine edisylate], Ergotamine-caffeine, Ergotamine-caffeine, Prochlorperazine, Prochlorperazine edisylate, Tetanus-diphtheria toxoids td, Other, and Tetanus toxoid adsorbed   Social History   Socioeconomic History   Marital status: Widowed    Spouse name: Not on file   Number of children: Not on file   Years of education: Not on file   Highest education level: Not on file  Occupational History   Occupation: Retired    Comment: Nurse  Tobacco Use   Smoking status: Never   Smokeless tobacco: Never  Vaping Use   Vaping Use: Never used  Substance and Sexual Activity   Alcohol use: Yes    Alcohol/week: 1.0 standard drink    Types: 1 Glasses of wine per week    Comment: daily   Drug use: No   Sexual activity: Not on file  Other Topics Concern   Not on file  Social History Narrative   Not on file   Social Determinants of Health   Financial Resource Strain: Not on file  Food Insecurity: Not on file  Transportation Needs: Not on file  Physical Activity: Not on file  Stress: Not on file  Social Connections: Not on file     Family History: The patient's family history includes Aortic stenosis in her father; Diabetes  in her sister and sister; Hypertension in her father and mother; Hyperthyroidism in her sister, sister, sister, and sister. There is no history of Colon cancer or Breast cancer.  ROS:   Please see the history of present illness.    All other systems reviewed and are negative.  EKGs/Labs/Other Studies Reviewed:    EKG:  EKG is ordered today.  The ekg ordered today demonstrates NSR 65 bpm, within normal limits  Recent Labs: No results found for requested labs within last 8760 hours.  Recent Lipid Panel    Component Value Date/Time   CHOL 158 02/01/2015 0332   TRIG 65 02/01/2015 0332   HDL 66 02/01/2015 0332   CHOLHDL 2.4  02/01/2015 0332   VLDL 13 02/01/2015 0332   LDLCALC 79 02/01/2015 0332     Risk Assessment/Calculations:           Physical Exam:    VS:  BP (!) 150/92   Pulse 65   Ht '5\' 7"'$  (1.702 m)   Wt 116 lb 3.2 oz (52.7 kg)   SpO2 95%   BMI 18.20 kg/m     Wt Readings from Last 3 Encounters:  12/12/21 116 lb 3.2 oz (52.7 kg)  01/19/21 113 lb (51.3 kg)  11/28/20 112 lb 9.6 oz (51.1 kg)     GEN:  Well nourished, well developed in no acute distress HEENT: Normal NECK: No JVD; No carotid bruits LYMPHATICS: No lymphadenopathy CARDIAC: RRR, no murmurs, rubs, gallops RESPIRATORY:  Clear to auscultation without rales, wheezing or rhonchi  ABDOMEN: Soft, non-tender, non-distended MUSCULOSKELETAL:  No edema; No deformity  SKIN: Warm and dry NEUROLOGIC:  Alert and oriented x 3 PSYCHIATRIC:  Normal affect   ASSESSMENT:    1. Takotsubo cardiomyopathy   2. Benign essential HTN    PLAN:    In order of problems listed above:  The patient appears clinically stable.  I am going to order a repeat echocardiogram since it has been 7 years since her last study.  She is experiencing some palpitations and chest discomfort on occasion.  Her exercise capacity appears to be good as she can walk up to 2-1/2 miles without rest.  She will continue on a combination of carvedilol  and benazepril. Home readings are in an excellent range averaging about 110/70 mmHg.  She appears to have whitecoat hypertension.  She will continue on her current regimen.  She monitors her blood pressure periodically and understands to contact our office if she is seeing any readings greater than 140/90 mmHg.     Medication Adjustments/Labs and Tests Ordered: Current medicines are reviewed at length with the patient today.  Concerns regarding medicines are outlined above.  Orders Placed This Encounter  Procedures   EKG 12-Lead   ECHOCARDIOGRAM COMPLETE   Meds ordered this encounter  Medications   benazepril (LOTENSIN) 10 MG tablet    Sig: Take 1 tablet (10 mg total) by mouth daily.    Dispense:  90 tablet    Refill:  3   carvedilol (COREG) 6.25 MG tablet    Sig: TAKE 1 TABLET(6.25 MG) BY MOUTH TWICE DAILY WITH A MEAL    Dispense:  180 tablet    Refill:  3   furosemide (LASIX) 20 MG tablet    Sig: Take 1 tablet (20 mg total) by mouth daily.    Dispense:  90 tablet    Refill:  3   potassium chloride SA (KLOR-CON M) 20 MEQ tablet    Sig: TAKE 1 TABLET(20 MEQ) BY MOUTH DAILY    Dispense:  90 tablet    Refill:  3    Patient Instructions  Medication Instructions:  Your physician recommends that you continue on your current medications as directed. Please refer to the Current Medication list given to you today.  *If you need a refill on your cardiac medications before your next appointment, please call your pharmacy*   Lab Work: NONE If you have labs (blood work) drawn today and your tests are completely normal, you will receive your results only by: [place] (if you have MyChart) OR A paper copy in the mail If you have any lab test that is abnormal or we need to change your  treatment, we will call you to review the results.   Testing/Procedures: ECHO Your physician has requested that you have an echocardiogram. Echocardiography is a painless test that uses sound  waves to create images of your heart. It provides your doctor with information about the size and shape of your heart and how well your heart's chambers and valves are working. This procedure takes approximately one hour. There are no restrictions for this procedure.  Follow-Up: At [facility], you and your health needs are our priority.  As part of our continuing mission to provide you with exceptional heart care, we have created designated Provider Care Teams.  These Care Teams include your primary Cardiologist (physician) and Advanced Practice Providers (APPs -  Physician Assistants and Nurse Practitioners) who all work together to provide you with the care you need, when you need it.  Your next appointment:   1 year(s)  The format for your next appointment:   In Person  Provider:   Sherren Mocha, MD       Important Information About Sugar         Signed, Sherren Mocha, MD  12/12/2021 4:56 PM    [place]

## 2021-12-12 NOTE — Patient Instructions (Signed)
Medication Instructions:  Your physician recommends that you continue on your current medications as directed. Please refer to the Current Medication list given to you today.  *If you need a refill on your cardiac medications before your next appointment, please call your pharmacy*   Lab Work: NONE If you have labs (blood work) drawn today and your tests are completely normal, you will receive your results only by: [place] (if you have MyChart) OR A paper copy in the mail If you have any lab test that is abnormal or we need to change your treatment, we will call you to review the results.   Testing/Procedures: ECHO Your physician has requested that you have an echocardiogram. Echocardiography is a painless test that uses sound waves to create images of your heart. It provides your doctor with information about the size and shape of your heart and how well your heart's chambers and valves are working. This procedure takes approximately one hour. There are no restrictions for this procedure.  Follow-Up: At [facility], you and your health needs are our priority.  As part of our continuing mission to provide you with exceptional heart care, we have created designated Provider Care Teams.  These Care Teams include your primary Cardiologist (physician) and Advanced Practice Providers (APPs -  Physician Assistants and Nurse Practitioners) who all work together to provide you with the care you need, when you need it.  Your next appointment:   1 year(s)  The format for your next appointment:   In Person  Provider:   Sherren Mocha, MD       Important Information About Sugar

## 2022-01-02 ENCOUNTER — Ambulatory Visit (HOSPITAL_COMMUNITY): Payer: Medicare Other | Attending: Internal Medicine

## 2022-01-02 DIAGNOSIS — I5181 Takotsubo syndrome: Secondary | ICD-10-CM

## 2022-01-02 LAB — ECHOCARDIOGRAM COMPLETE
Area-P 1/2: 4.79 cm2
S' Lateral: 2.8 cm

## 2022-01-27 ENCOUNTER — Other Ambulatory Visit (HOSPITAL_COMMUNITY): Payer: Self-pay

## 2022-01-27 ENCOUNTER — Other Ambulatory Visit: Payer: Self-pay | Admitting: Physician Assistant

## 2022-01-28 ENCOUNTER — Ambulatory Visit (HOSPITAL_COMMUNITY)
Admission: RE | Admit: 2022-01-28 | Discharge: 2022-01-28 | Disposition: A | Discharge status: Home / Self Care | Payer: Medicare Other | Source: Ambulatory Visit | Attending: Endocrinology | Admitting: Endocrinology

## 2022-01-28 DIAGNOSIS — I5022 Chronic systolic (congestive) heart failure: Secondary | ICD-10-CM | POA: Insufficient documentation

## 2022-01-28 DIAGNOSIS — I10 Essential (primary) hypertension: Secondary | ICD-10-CM | POA: Diagnosis not present

## 2022-01-28 DIAGNOSIS — I5181 Takotsubo syndrome: Secondary | ICD-10-CM | POA: Insufficient documentation

## 2022-01-28 DIAGNOSIS — M810 Age-related osteoporosis without current pathological fracture: Secondary | ICD-10-CM | POA: Diagnosis not present

## 2022-01-28 MED ORDER — DENOSUMAB 60 MG/ML ~~LOC~~ SOSY
PREFILLED_SYRINGE | SUBCUTANEOUS | Status: AC
  Administered 2022-01-28: 60 mg via SUBCUTANEOUS
  Filled 2022-01-28: qty 1

## 2022-01-28 MED ORDER — DENOSUMAB 60 MG/ML ~~LOC~~ SOSY: 60.0000 mg | PREFILLED_SYRINGE | Freq: Once | SUBCUTANEOUS | Status: AC

## 2022-02-14 ENCOUNTER — Other Ambulatory Visit: Payer: Self-pay | Admitting: Endocrinology

## 2022-02-14 DIAGNOSIS — Z1231 Encounter for screening mammogram for malignant neoplasm of breast: Secondary | ICD-10-CM

## 2022-02-17 DIAGNOSIS — F330 Major depressive disorder, recurrent, mild: Secondary | ICD-10-CM | POA: Diagnosis not present

## 2022-02-17 DIAGNOSIS — I10 Essential (primary) hypertension: Secondary | ICD-10-CM | POA: Diagnosis not present

## 2022-02-17 DIAGNOSIS — E785 Hyperlipidemia, unspecified: Secondary | ICD-10-CM | POA: Diagnosis not present

## 2022-03-06 DIAGNOSIS — Z79899 Other long term (current) drug therapy: Secondary | ICD-10-CM | POA: Diagnosis not present

## 2022-03-06 DIAGNOSIS — K5010 Crohn's disease of large intestine without complications: Secondary | ICD-10-CM | POA: Diagnosis not present

## 2022-03-19 ENCOUNTER — Ambulatory Visit
Admission: RE | Admit: 2022-03-19 | Discharge: 2022-03-19 | Disposition: A | Discharge status: Home / Self Care | Payer: Medicare Other | Source: Ambulatory Visit | Attending: Endocrinology | Admitting: Endocrinology

## 2022-03-19 DIAGNOSIS — Z1231 Encounter for screening mammogram for malignant neoplasm of breast: Secondary | ICD-10-CM | POA: Diagnosis not present

## 2022-03-20 DIAGNOSIS — Z01419 Encounter for gynecological examination (general) (routine) without abnormal findings: Secondary | ICD-10-CM | POA: Diagnosis not present

## 2022-03-20 DIAGNOSIS — D251 Intramural leiomyoma of uterus: Secondary | ICD-10-CM | POA: Diagnosis not present

## 2022-03-20 DIAGNOSIS — Z681 Body mass index (BMI) 19 or less, adult: Secondary | ICD-10-CM | POA: Diagnosis not present

## 2022-03-20 DIAGNOSIS — R1031 Right lower quadrant pain: Secondary | ICD-10-CM | POA: Diagnosis not present

## 2022-04-07 DIAGNOSIS — H5201 Hypermetropia, right eye: Secondary | ICD-10-CM | POA: Diagnosis not present

## 2022-04-07 DIAGNOSIS — H16143 Punctate keratitis, bilateral: Secondary | ICD-10-CM | POA: Diagnosis not present

## 2022-04-07 DIAGNOSIS — H52222 Regular astigmatism, left eye: Secondary | ICD-10-CM | POA: Diagnosis not present

## 2022-04-07 DIAGNOSIS — H524 Presbyopia: Secondary | ICD-10-CM | POA: Diagnosis not present

## 2022-04-23 ENCOUNTER — Other Ambulatory Visit (HOSPITAL_BASED_OUTPATIENT_CLINIC_OR_DEPARTMENT_OTHER): Payer: Self-pay

## 2022-04-23 MED ORDER — INFLUENZA VAC A&B SA ADJ QUAD 0.5 ML IM PRSY
PREFILLED_SYRINGE | INTRAMUSCULAR | 0 refills | Status: AC
  Filled 2022-04-23: qty 0.5, 1d supply, fill #0

## 2022-05-08 DIAGNOSIS — Z1212 Encounter for screening for malignant neoplasm of rectum: Secondary | ICD-10-CM | POA: Diagnosis not present

## 2022-05-08 DIAGNOSIS — I10 Essential (primary) hypertension: Secondary | ICD-10-CM | POA: Diagnosis not present

## 2022-05-08 DIAGNOSIS — M810 Age-related osteoporosis without current pathological fracture: Secondary | ICD-10-CM | POA: Diagnosis not present

## 2022-05-08 DIAGNOSIS — L812 Freckles: Secondary | ICD-10-CM | POA: Diagnosis not present

## 2022-05-08 DIAGNOSIS — D225 Melanocytic nevi of trunk: Secondary | ICD-10-CM | POA: Diagnosis not present

## 2022-05-08 DIAGNOSIS — R7989 Other specified abnormal findings of blood chemistry: Secondary | ICD-10-CM | POA: Diagnosis not present

## 2022-05-08 DIAGNOSIS — Z85828 Personal history of other malignant neoplasm of skin: Secondary | ICD-10-CM | POA: Diagnosis not present

## 2022-05-08 DIAGNOSIS — E538 Deficiency of other specified B group vitamins: Secondary | ICD-10-CM | POA: Diagnosis not present

## 2022-05-08 DIAGNOSIS — L821 Other seborrheic keratosis: Secondary | ICD-10-CM | POA: Diagnosis not present

## 2022-05-08 DIAGNOSIS — E785 Hyperlipidemia, unspecified: Secondary | ICD-10-CM | POA: Diagnosis not present

## 2022-05-20 DIAGNOSIS — Z0000 Encounter for general adult medical examination without abnormal findings: Secondary | ICD-10-CM | POA: Diagnosis not present

## 2022-05-20 DIAGNOSIS — Z1389 Encounter for screening for other disorder: Secondary | ICD-10-CM | POA: Diagnosis not present

## 2022-05-20 DIAGNOSIS — R82998 Other abnormal findings in urine: Secondary | ICD-10-CM | POA: Diagnosis not present

## 2022-05-20 DIAGNOSIS — I2510 Atherosclerotic heart disease of native coronary artery without angina pectoris: Secondary | ICD-10-CM | POA: Diagnosis not present

## 2022-05-20 DIAGNOSIS — I10 Essential (primary) hypertension: Secondary | ICD-10-CM | POA: Diagnosis not present

## 2022-05-20 DIAGNOSIS — I700 Atherosclerosis of aorta: Secondary | ICD-10-CM | POA: Diagnosis not present

## 2022-05-20 DIAGNOSIS — Z23 Encounter for immunization: Secondary | ICD-10-CM | POA: Diagnosis not present

## 2022-05-20 DIAGNOSIS — Z1331 Encounter for screening for depression: Secondary | ICD-10-CM | POA: Diagnosis not present

## 2022-06-09 DIAGNOSIS — N390 Urinary tract infection, site not specified: Secondary | ICD-10-CM | POA: Diagnosis not present

## 2022-06-09 DIAGNOSIS — M549 Dorsalgia, unspecified: Secondary | ICD-10-CM | POA: Diagnosis not present

## 2022-06-09 DIAGNOSIS — N760 Acute vaginitis: Secondary | ICD-10-CM | POA: Diagnosis not present

## 2022-06-26 DIAGNOSIS — N281 Cyst of kidney, acquired: Secondary | ICD-10-CM | POA: Diagnosis not present

## 2022-07-29 ENCOUNTER — Other Ambulatory Visit: Payer: Self-pay | Admitting: Cardiovascular Disease

## 2022-08-04 ENCOUNTER — Other Ambulatory Visit (HOSPITAL_COMMUNITY): Payer: Self-pay | Admitting: *Deleted

## 2022-08-05 ENCOUNTER — Ambulatory Visit (HOSPITAL_COMMUNITY)
Admission: RE | Admit: 2022-08-05 | Discharge: 2022-08-05 | Disposition: A | Discharge status: Home / Self Care | Payer: Medicare Other | Source: Ambulatory Visit | Attending: Endocrinology | Admitting: Endocrinology

## 2022-08-05 DIAGNOSIS — M810 Age-related osteoporosis without current pathological fracture: Secondary | ICD-10-CM | POA: Diagnosis not present

## 2022-08-05 MED ORDER — DENOSUMAB 60 MG/ML ~~LOC~~ SOSY
60.0000 mg | PREFILLED_SYRINGE | Freq: Once | SUBCUTANEOUS | Status: AC
  Administered 2022-08-05: 60 mg via SUBCUTANEOUS

## 2022-08-05 MED ORDER — DENOSUMAB 60 MG/ML ~~LOC~~ SOSY
PREFILLED_SYRINGE | SUBCUTANEOUS | Status: AC
  Filled 2022-08-05: qty 1

## 2022-08-28 DIAGNOSIS — R399 Unspecified symptoms and signs involving the genitourinary system: Secondary | ICD-10-CM | POA: Diagnosis not present

## 2022-09-11 DIAGNOSIS — L570 Actinic keratosis: Secondary | ICD-10-CM | POA: Diagnosis not present

## 2022-09-12 DIAGNOSIS — R059 Cough, unspecified: Secondary | ICD-10-CM | POA: Diagnosis not present

## 2022-09-12 DIAGNOSIS — J029 Acute pharyngitis, unspecified: Secondary | ICD-10-CM | POA: Diagnosis not present

## 2022-09-12 DIAGNOSIS — B349 Viral infection, unspecified: Secondary | ICD-10-CM | POA: Diagnosis not present

## 2022-10-30 DIAGNOSIS — K5010 Crohn's disease of large intestine without complications: Secondary | ICD-10-CM | POA: Diagnosis not present

## 2022-11-02 ENCOUNTER — Other Ambulatory Visit: Payer: Self-pay | Admitting: Cardiovascular Disease

## 2022-11-28 DIAGNOSIS — E785 Hyperlipidemia, unspecified: Secondary | ICD-10-CM | POA: Diagnosis not present

## 2022-11-28 DIAGNOSIS — I2510 Atherosclerotic heart disease of native coronary artery without angina pectoris: Secondary | ICD-10-CM | POA: Diagnosis not present

## 2022-11-28 DIAGNOSIS — I700 Atherosclerosis of aorta: Secondary | ICD-10-CM | POA: Diagnosis not present

## 2022-11-28 DIAGNOSIS — I10 Essential (primary) hypertension: Secondary | ICD-10-CM | POA: Diagnosis not present

## 2022-12-08 ENCOUNTER — Other Ambulatory Visit: Payer: Self-pay | Admitting: Cardiovascular Disease

## 2022-12-16 ENCOUNTER — Ambulatory Visit: Payer: Medicare Other | Admitting: Student

## 2022-12-17 NOTE — Progress Notes (Unsigned)
Office Visit    Patient Name: RAGENA DOVALE Date of Encounter: 12/18/2022  PCP:  Adrian Prince, MD   [facility]  Cardiologist:  Tonny Bollman, MD  Advanced Practice Provider:  No care team member to display Electrophysiologist:  None    HPI    Kirsten Rice is a 77 y.o. female with past medical history of Takotsubo syndrome, hypertension, hypothyroidism, severe LV dysfunction presents today for follow-up appointment.  Patient was seen by Dr. Excell Seltzer 11/2021.  History includes initially presenting in 2007 with acute Takotsubo syndrome and severe LV dysfunction.  Had recurrence in 2016 where her LVEF was estimated to be 25 to 35%.  Follow-up echocardiogram demonstrated improvement in LVEF of 55 to 60%.  Patient presented for follow-up and was not having any exertional chest pain/pressure.  No edema, orthopnea, PND.  She did endorse an occasional heart palpitation associated with pressure in her chest at nighttime when she lays down.  No other heart related symptoms.  Today, she tells me that sometimes she has ankle swelling with prolonged standing but the puffiness resolves after night of rest.  Occasionally she has palpitations when she first goes to bed. Sometimes racing at night no pain, once a week or so. She checks her BP at home and its 110-130 systolic. She does some walking 2.5 miles when its nice outside and she does piliates at [employer]. She plans to preserve the muscle mass that she has. She was on prednisone for years due to Chron's disease. She also has a history of essential tremors and Reynolds. She does have some neuropathy which she notices at night.  She does not have a diabetes diagnosis.  Discussed possible medications but she would like to avoid adding any medications at this time. Could discuss with her PCP.   Reports no shortness of breath nor dyspnea on exertion. Reports no chest pain, pressure, or tightness. No edema, orthopnea, PND.    Past  Medical History    Past Medical History:  Diagnosis Date   Arthritis    "probably a little bit in my knees and hips" (01/31/2015)   Basal cell carcinoma    Chronic systolic CHF (congestive heart failure) (HCC)    a. 01/2015 Echo: EF 25-35% in setting of takotsubo cardiomyopathy.   Crohn's disease (HCC)    Deep vein thrombosis (HCC)    pt denies: 04/08/13   Essential hypertension    History of pneumonia    "2-3 times" (01/31/2015)   Hyperthyroidism    pt denies:  04/08/13   Mitral valve prolapse    a. 01/2015 Echo: Ef 25-35%, triv MR.   Myocardial infarct [facility])    Ocular headache    "weekly" (01/31/2015)   Persistent dry cough    PONV (postoperative nausea and vomiting) 1970's   "when I had my appendix out"   Raynaud's disease    Squamous carcinoma    Takotsubo cardiomyopathy    a. 2007 Cath: nl cors, apical ballooning w/ subsequent recovery of LV fxn;  b. 01/2015 NSTEMI/Cath: nl cors, EF 25-25% w/ apical ballooning;  c. 01/2015 echo: EF 30%, septal, apical, mid anterior, inf HK, mildly dil LA/RA.   Telangiectasia    Past Surgical History:  Procedure Laterality Date   APPENDECTOMY  1971   BASAL CELL CARCINOMA EXCISION     forehead, back, legs, arms" (01/31/2015)   CARDIAC CATHETERIZATION  2006; 2007   CARDIAC CATHETERIZATION N/A 01/31/2015   Procedure: Left Heart Cath and Coronary  Angiography;  Surgeon: Corky Crafts, MD;  Location: [facility] INVASIVE CV LAB;  Service: Cardiovascular;  Laterality: N/A;   COLONOSCOPY     COLONOSCOPY  05/11/2012   Procedure: COLONOSCOPY;  Surgeon: Hart Carwin, MD;  Location: [facility] ENDOSCOPY;  Service: Endoscopy;  Laterality: N/A;   SHOULDER ARTHROSCOPY W/ ROTATOR CUFF REPAIR Right 1996   SQUAMOUS CELL CARCINOMA EXCISION Left early 1980's   "eyelid"   TUBAL LIGATION  1973    Allergies  Allergies  Allergen Reactions   Cafergot [Ergotamine-Caffeine] Other (See Comments)    Vagal paralysis   Compazine [Prochlorperazine Edisylate] Other (See  Comments)    Other reaction(s): stroke like sx paralysis of face   Ergotamine-Caffeine     Other reaction(s): vagal response   Ergotamine-Caffeine     Other reaction(s): Other (See Comments) Other reaction(s): vagal response   Prochlorperazine     Other reaction(s): Other (See Comments) Patient states like had a stroke   Prochlorperazine Edisylate Other (See Comments)    REACTION: extrapyrimadal syndrome   Tetanus-Diphtheria Toxoids Td Swelling    REACTION: arm swells. Back in the 1970's when made with horse serum. Has had tetanus shot since then without problem.   Other     Other reaction(s): Other (See Comments) Passed out   Tetanus Toxoid Adsorbed Swelling    Horse serum back in 1970's. Pt has had tetanus shot since    EKGs/Labs/Other Studies Reviewed:   The following studies were reviewed today: Cardiac Studies & Procedures   CARDIAC CATHETERIZATION  CARDIAC CATHETERIZATION 01/31/2015  Narrative  There is severe left ventricular systolic dysfunction in a pattern of stress induced/Takatsubo cardiomyopathy.  No significant CAD.  Continue medical therapy for left ventricular dysfunction.  CC: Dr. Tonny Bollman.  Findings Coronary Findings Diagnostic  Dominance: Right  Left Anterior Descending  Second Diagonal Branch The vessel is small in size.  Third Diagonal Branch The vessel is small in size.  Intervention  No interventions have been documented.     ECHOCARDIOGRAM  ECHOCARDIOGRAM COMPLETE 01/02/2022  Narrative ECHOCARDIOGRAM REPORT    Patient Name:   [name]   Date of Exam: 01/02/2022 Medical Rec #:  [id number]     Height:       67.0 in Accession #:    [id number]    Weight:       116.2 lb Date of Birth:  [birth year]     BSA:          1.605 m Patient Age:    75 years      BP:           152/92 mmHg Patient Gender: F             HR:           65 bpm. Exam Location:  [address]  Procedure: 2D Echo, 3D Echo, Cardiac Doppler, Color Doppler  and Strain Analysis  Indications:    I51.81 Takotsubo cardiomyopathy  History:        Patient has prior history of Echocardiogram examinations, most recent 03/12/2015. Previous Myocardial Infarction, Mitral Valve Prolapse; Risk Factors:Hypertension. Chronic systolic heart failure.  Sonographer:    Sedonia Small Rodgers-Jones RDCS Referring Phys: [id number] MICHAEL COOPER  IMPRESSIONS   1. Global longitudinal strain is -22.8%. Left ventricular ejection fraction, by estimation, is 60 to 65%. The left ventricle has normal function. The left ventricle has no regional wall motion abnormalities. Left ventricular diastolic parameters were normal. 2. Right ventricular systolic function is normal. The right ventricular size  is normal. 3. Mild mitral valve regurgitation. 4. The aortic valve is normal in structure. Aortic valve regurgitation is not visualized. 5. The inferior vena cava is dilated in size with <50% respiratory variability, suggesting right atrial pressure of 15 mmHg.  FINDINGS Left Ventricle: Global longitudinal strain is -22.8%. Left ventricular ejection fraction, by estimation, is 60 to 65%. The left ventricle has normal function. The left ventricle has no regional wall motion abnormalities. The left ventricular internal cavity size was normal in size. There is no left ventricular hypertrophy. Left ventricular diastolic parameters were normal.  Right Ventricle: The right ventricular size is normal. Right vetricular wall thickness was not assessed. Right ventricular systolic function is normal.  Left Atrium: Left atrial size was normal in size.  Right Atrium: Right atrial size was normal in size.  Pericardium: Trivial pericardial effusion is present.  Mitral Valve: There is mild thickening of the mitral valve leaflet(s). Mild mitral valve regurgitation.  Tricuspid Valve: The tricuspid valve is normal in structure. Tricuspid valve regurgitation is trivial.  Aortic Valve: The aortic  valve is normal in structure. Aortic valve regurgitation is not visualized.  Pulmonic Valve: The pulmonic valve was normal in structure. Pulmonic valve regurgitation is mild.  Aorta: The aortic root is normal in size and structure.  Venous: The inferior vena cava is dilated in size with less than 50% respiratory variability, suggesting right atrial pressure of 15 mmHg.  IAS/Shunts: No atrial level shunt detected by color flow Doppler.   LEFT VENTRICLE PLAX 2D LVIDd:         4.10 cm   Diastology LVIDs:         2.80 cm   LV e' medial:    8.38 cm/s LV PW:         0.90 cm   LV E/e' medial:  10.3 LV IVS:        0.90 cm   LV e' lateral:   11.20 cm/s LVOT diam:     1.70 cm   LV E/e' lateral: 7.7 LV SV:         42 LV SV Index:   26        2D Longitudinal Strain LVOT Area:     2.27 cm  2D Strain GLS (A2C):   -22.3 % 2D Strain GLS (A3C):   -22.5 % 2D Strain GLS (A4C):   -23.4 % 2D Strain GLS Avg:     -22.8 %  3D Volume EF: 3D EF:        53 % LV EDV:       84 ml LV ESV:       40 ml LV SV:        45 ml  RIGHT VENTRICLE             IVC RV Basal diam:  2.90 cm     IVC diam: 2.10 cm RV S prime:     17.50 cm/s TAPSE (M-mode): 1.7 cm  LEFT ATRIUM             Index        RIGHT ATRIUM           Index LA diam:        3.70 cm 2.30 cm/m   RA Area:     13.20 cm LA Vol (A2C):   21.9 ml 13.64 ml/m  RA Volume:   29.80 ml  18.56 ml/m LA Vol (A4C):   38.5 ml 23.98 ml/m LA Biplane Vol: 28.6 ml 17.81  ml/m AORTIC VALVE LVOT Vmax:   73.65 cm/s LVOT Vmean:  50.300 cm/s LVOT VTI:    0.187 m  AORTA Ao Root diam: 3.10 cm Ao Asc diam:  3.30 cm  MITRAL VALVE               TRICUSPID VALVE MV Area (PHT): 4.79 cm    TR Peak grad:   26.4 mmHg MV Decel Time: 159 msec    TR Vmax:        257.00 cm/s MV E velocity: 86.20 cm/s MV A velocity: 49.80 cm/s  SHUNTS MV E/A ratio:  1.73        Systemic VTI:  0.19 m Systemic Diam: 1.70 cm  Dietrich Pates MD Electronically signed by Dietrich Pates MD Signature  Date/Time: 01/02/2022/2:34:53 PM    Final              EKG:  EKG is  ordered today.  The ekg ordered today demonstrates sinus bradycardia, rate 57 bpm  Recent Labs: No results found for requested labs within last 365 days.  Recent Lipid Panel    Component Value Date/Time   CHOL 158 02/01/2015 0332   TRIG 65 02/01/2015 0332   HDL 66 02/01/2015 0332   CHOLHDL 2.4 02/01/2015 0332   VLDL 13 02/01/2015 0332   LDLCALC 79 02/01/2015 0332   Home Medications   Current Meds  Medication Sig   acetaminophen (TYLENOL) 325 MG tablet Take 650 mg by mouth every 6 (six) hours as needed for mild pain or headache.   aspirin 81 MG tablet Take 81 mg by mouth daily.   balsalazide (COLAZAL) 750 MG capsule Take 2,250 mg by mouth 3 (three) times daily.   bifidobacterium infantis (ALIGN) capsule Take 1 capsule by mouth daily.   Biotin 2500 MCG CAPS Take 2,500 mcg by mouth daily.    Calcium-Vitamin D 600-200 MG-UNIT per tablet Take 1 tablet by mouth daily.    Cholecalciferol (VITAMIN D3) 25 MCG (1000 UT) CAPS Take by mouth.   COVID-19 mRNA vaccine, Pfizer, 30 MCG/0.3ML injection Inject into the muscle.   denosumab (PROLIA) 60 MG/ML SOSY injection Inject into the skin.   Flaxseed, Linseed, (FLAXSEED OIL PO) Take by mouth.   influenza vaccine adjuvanted (FLUAD) 0.5 ML injection Inject into the muscle.   influenza vaccine adjuvanted (FLUAD) 0.5 ML injection Inject into the muscle.   montelukast (SINGULAIR) 10 MG tablet montelukast 10 mg tablet  TAKE 1 TABLET BY MOUTH EVERY DAY   vitamin B-12 (CYANOCOBALAMIN) 1000 MCG tablet Take 1,000 mcg by mouth daily.   vitamin C (ASCORBIC ACID) 500 MG tablet Take 500 mg by mouth daily.   vitamin E 180 MG (400 UNITS) capsule Take 1 capsule by mouth daily.   [DISCONTINUED] benazepril (LOTENSIN) 10 MG tablet TAKE 1 TABLET(10 MG) BY MOUTH DAILY   [DISCONTINUED] carvedilol (COREG) 6.25 MG tablet TAKE 1 TABLET(6.25 MG) BY MOUTH TWICE DAILY WITH A MEAL   [DISCONTINUED]  furosemide (LASIX) 20 MG tablet Take 1 tablet (20 mg total) by mouth daily. Please keep scheduled appointment for future refills. Thank you.   [DISCONTINUED] nitroGLYCERIN (NITROSTAT) 0.4 MG SL tablet DISSOLVE 1 TABLET UNDER THE TONGUE EVERY 5 MINUTES FOR UP TO 3 DOSES AS NEEDED FOR CHEST PAIN   [DISCONTINUED] potassium chloride SA (KLOR-CON M) 20 MEQ tablet TAKE 1 TABLET(20 MEQ) BY MOUTH DAILY     Review of Systems      All other systems reviewed and are otherwise negative except as noted above.  Physical Exam    VS:  BP (!) 168/98   Pulse (!) 57   Ht 5\' 7"  (1.702 m)   Wt 118 lb 3.2 oz (53.6 kg)   SpO2 98%   BMI 18.51 kg/m  , BMI Body mass index is 18.51 kg/m.  Wt Readings from Last 3 Encounters:  12/18/22 118 lb 3.2 oz (53.6 kg)  12/12/21 116 lb 3.2 oz (52.7 kg)  01/19/21 113 lb (51.3 kg)     GEN: Well nourished, well developed, in no acute distress. HEENT: normal. Neck: Supple, no JVD, carotid bruits, or masses. Cardiac: RRR, no murmurs, rubs, or gallops. No clubbing, cyanosis, edema.  Radials/PT 2+ and equal bilaterally.  Respiratory:  Respirations regular and unlabored, clear to auscultation bilaterally. GI: Soft, nontender, nondistended. MS: No deformity or atrophy. Skin: Warm and dry, no rash. Neuro:  Strength and sensation are intact. Psych: Normal affect.  Assessment & Plan    Takotsubo cardiomyopathy -No recent issues, continue current medications -Currently, she is taking aspirin 81 mg daily, benazepril 10 mg daily, carvedilol 6.25 mg daily (prefers not to change to twice daily dosing), Lasix 20 mg daily, potassium 20 mEq daily  Benign essential hypertension -Blood pressures this morning but she has not taken her morning medications -Would continue to monitor blood pressure at home -Continue low-sodium, heart healthy diet -She remains physically active and walks several times a week as well as does a Pilates class at [employer]  3.  Hyperlipidemia -Her LDL is slightly above at 112, total cholesterol 210, triglycerides 60 -would like to avoid additional medications at this time especially statins    Disposition: Follow up 1 year with Tonny Bollman, MD or APP.  Signed, Sharlene Dory, PA-C 12/18/2022, 9:40 AM [facility]

## 2022-12-18 ENCOUNTER — Ambulatory Visit: Payer: Medicare Other | Attending: Student | Admitting: Physician Assistant

## 2022-12-18 ENCOUNTER — Encounter: Payer: Self-pay | Admitting: Physician Assistant

## 2022-12-18 VITALS — BP 168/98 | HR 57 | Ht 67.0 in | Wt 118.2 lb

## 2022-12-18 DIAGNOSIS — E785 Hyperlipidemia, unspecified: Secondary | ICD-10-CM

## 2022-12-18 DIAGNOSIS — I5181 Takotsubo syndrome: Secondary | ICD-10-CM | POA: Diagnosis not present

## 2022-12-18 DIAGNOSIS — I10 Essential (primary) hypertension: Secondary | ICD-10-CM

## 2022-12-18 MED ORDER — BENAZEPRIL HCL 10 MG PO TABS: ORAL_TABLET | ORAL | 3 refills | Status: DC

## 2022-12-18 MED ORDER — CARVEDILOL 6.25 MG PO TABS: ORAL_TABLET | ORAL | 3 refills | Status: DC

## 2022-12-18 MED ORDER — POTASSIUM CHLORIDE CRYS ER 20 MEQ PO TBCR: EXTENDED_RELEASE_TABLET | ORAL | 3 refills | Status: DC

## 2022-12-18 MED ORDER — FUROSEMIDE 20 MG PO TABS: 20.0000 mg | ORAL_TABLET | Freq: Every day | ORAL | 3 refills | Status: DC

## 2022-12-18 MED ORDER — NITROGLYCERIN 0.4 MG SL SUBL: SUBLINGUAL_TABLET | SUBLINGUAL | 3 refills | Status: DC

## 2022-12-18 NOTE — Patient Instructions (Signed)
Medication Instructions:   Your physician recommends that you continue on your current medications as directed. Please refer to the Current Medication list given to you today.   *If you need a refill on your cardiac medications before your next appointment, please call your pharmacy*   Lab Work: NONE ORDERED  TODAY    If you have labs (blood work) drawn today and your tests are completely normal, you will receive your results only by: MyChart Message (if you have MyChart) OR A paper copy in the mail If you have any lab test that is abnormal or we need to change your treatment, we will call you to review the results.   Testing/Procedures: NONE ORDERED  TODAY      Follow-Up: At [facility], you and your health needs are our priority.  As part of our continuing mission to provide you with exceptional heart care, we have created designated Provider Care Teams.  These Care Teams include your primary Cardiologist (physician) and Advanced Practice Providers (APPs -  Physician Assistants and Nurse Practitioners) who all work together to provide you with the care you need, when you need it.  We recommend signing up for the patient portal called "MyChart".  Sign up information is provided on this After Visit Summary.  MyChart is used to connect with patients for Virtual Visits (Telemedicine).  Patients are able to view lab/test results, encounter notes, upcoming appointments, etc.  Non-urgent messages can be sent to your provider as well.   To learn more about what you can do with MyChart, go to [email or web address].    Your next appointment:   1 year(s)  Provider:   Tonny Bollman, MD     Other Instructions  Heart-Healthy Eating Plan Eating a healthy diet is important for the health of your heart. A heart-healthy eating plan includes: Eating less unhealthy fats. Eating more healthy fats. Eating less salt in your food. Salt is also called sodium. Making other changes in  your diet. Talk with your doctor or a diet specialist (dietitian) to create an eating plan that is right for you. What is my plan? Your doctor may recommend an eating plan that includes: Total fat: ______% or less of total calories a day. Saturated fat: ______% or less of total calories a day. Cholesterol: less than _________mg a day. Sodium: less than _________mg a day. What are tips for following this plan? Cooking Avoid frying your food. Try to bake, boil, grill, or broil it instead. You can also reduce fat by: Removing the skin from poultry. Removing all visible fats from meats. Steaming vegetables in water or broth. Meal planning  At meals, divide your plate into four equal parts: Fill one-half of your plate with vegetables and green salads. Fill one-fourth of your plate with whole grains. Fill one-fourth of your plate with lean protein foods. Eat 2-4 cups of vegetables per day. One cup of vegetables is: 1 cup (91 g) broccoli or cauliflower florets. 2 medium carrots. 1 large bell pepper. 1 large sweet potato. 1 large tomato. 1 medium white potato. 2 cups (150 g) raw leafy greens. Eat 1-2 cups of fruit per day. One cup of fruit is: 1 small apple 1 large banana 1 cup (237 g) mixed fruit, 1 large orange,  cup (82 g) dried fruit, 1 cup (240 mL) 100% fruit juice. Eat more foods that have soluble fiber. These are apples, broccoli, carrots, beans, peas, and barley. Try to get 20-30 g of fiber per day. Eat 4-5  servings of nuts, legumes, and seeds per week: 1 serving of dried beans or legumes equals  cup (90 g) cooked. 1 serving of nuts is  oz (12 almonds, 24 pistachios, or 7 walnut halves). 1 serving of seeds equals  oz (8 g). General information Eat more home-cooked food. Eat less restaurant, buffet, and fast food. Limit or avoid alcohol. Limit foods that are high in starch and sugar. Avoid fried foods. Lose weight if you are overweight. Keep track of how much salt  (sodium) you eat. This is important if you have high blood pressure. Ask your doctor to tell you more about this. Try to add vegetarian meals each week. Fats Choose healthy fats. These include olive oil and canola oil, flaxseeds, walnuts, almonds, and seeds. Eat more omega-3 fats. These include salmon, mackerel, sardines, tuna, flaxseed oil, and ground flaxseeds. Try to eat fish at least 2 times each week. Check food labels. Avoid foods with trans fats or high amounts of saturated fat. Limit saturated fats. These are often found in animal products, such as meats, butter, and cream. These are also found in plant foods, such as palm oil, palm kernel oil, and coconut oil. Avoid foods with partially hydrogenated oils in them. These have trans fats. Examples are stick margarine, some tub margarines, cookies, crackers, and other baked goods. What foods should I eat? Fruits All fresh, canned (in natural juice), or frozen fruits. Vegetables Fresh or frozen vegetables (raw, steamed, roasted, or grilled). Green salads. Grains Most grains. Choose whole wheat and whole grains most of the time. Rice and pasta, including brown rice and pastas made with whole wheat. Meats and other proteins Lean, well-trimmed beef, veal, pork, and lamb. Chicken and [place] without skin. All fish and shellfish. Wild duck, rabbit, pheasant, and venison. Egg whites or low-cholesterol egg substitutes. Dried beans, peas, lentils, and tofu. Seeds and most nuts. Dairy Low-fat or nonfat cheeses, including ricotta and mozzarella. Skim or 1% milk that is liquid, powdered, or evaporated. Buttermilk that is made with low-fat milk. Nonfat or low-fat yogurt. Fats and oils Non-hydrogenated (trans-free) margarines. Vegetable oils, including soybean, sesame, sunflower, olive, peanut, safflower, corn, canola, and cottonseed. Salad dressings or mayonnaise made with a vegetable oil. Beverages Mineral water. Coffee and tea. Diet carbonated  beverages. Sweets and desserts Sherbet, gelatin, and fruit ice. Small amounts of dark chocolate. Limit all sweets and desserts. Seasonings and condiments All seasonings and condiments. The items listed above may not be a complete list of foods and drinks you can eat. Contact a dietitian for more options. What foods should I avoid? Fruits Canned fruit in heavy syrup. Fruit in cream or butter sauce. Fried fruit. Limit coconut. Vegetables Vegetables cooked in cheese, cream, or butter sauce. Fried vegetables. Grains Breads that are made with saturated or trans fats, oils, or whole milk. Croissants. Sweet rolls. Donuts. High-fat crackers, such as cheese crackers. Meats and other proteins Fatty meats, such as hot dogs, ribs, sausage, bacon, rib-eye roast or steak. High-fat deli meats, such as salami and bologna. Caviar. Domestic duck and goose. Organ meats, such as liver. Dairy Cream, sour cream, cream cheese, and creamed cottage cheese. Whole-milk cheeses. Whole or 2% milk that is liquid, evaporated, or condensed. Whole buttermilk. Cream sauce or high-fat cheese sauce. Yogurt that is made from whole milk. Fats and oils Meat fat, or shortening. Cocoa butter, hydrogenated oils, palm oil, coconut oil, palm kernel oil. Solid fats and shortenings, including bacon fat, salt pork, lard, and butter. Nondairy cream substitutes. Salad  dressings with cheese or sour cream. Beverages Regular sodas and juice drinks with added sugar. Sweets and desserts Frosting. Pudding. Cookies. Cakes. Pies. Milk chocolate or white chocolate. Buttered syrups. Full-fat ice cream or ice cream drinks. The items listed above may not be a complete list of foods and drinks to avoid. Contact a dietitian for more information. Summary Heart-healthy meal planning includes eating less unhealthy fats, eating more healthy fats, and making other changes in your diet. Eat a balanced diet. This includes fruits and vegetables, low-fat or  nonfat dairy, lean protein, nuts and legumes, whole grains, and heart-healthy oils and fats. This information is not intended to replace advice given to you by your health care provider. Make sure you discuss any questions you have with your health care provider. Document Revised: 08/12/2021 Document Reviewed: 08/12/2021 Elsevier Patient Education  2024 Elsevier Inc[employer] Low-Sodium Eating Plan Salt (sodium) helps you keep a healthy balance of fluids in your body. Too much sodium can raise your blood pressure. It can also cause fluid and waste to be held in your body. Your health care provider or dietitian may recommend a low-sodium eating plan if you have high blood pressure (hypertension), kidney disease, liver disease, or heart failure. Eating less sodium can help lower your blood pressure and reduce swelling. It can also protect your heart, liver, and kidneys. What are tips for following this plan? Reading food labels  Check food labels for the amount of sodium per serving. If you eat more than one serving, you must multiply the listed amount by the number of servings. Choose foods with less than 140 milligrams (mg) of sodium per serving. Avoid foods with 300 mg of sodium or more per serving. Always check how much sodium is in a product, even if the label says "unsalted" or "no salt added." Shopping  Buy products labeled as "low-sodium" or "no salt added." Buy fresh foods. Avoid canned foods and pre-made or frozen meals. Avoid canned, cured, or processed meats. Buy breads that have less than 80 mg of sodium per slice. Cooking  Eat more home-cooked food. Try to eat less restaurant, buffet, and fast food. Try not to add salt when you cook. Use salt-free seasonings or herbs instead of table salt or sea salt. Check with your provider or pharmacist before using salt substitutes. Cook with plant-based oils, such as canola, sunflower, or olive oil. Meal planning When eating at a restaurant, ask  if your food can be made with less salt or no salt. Avoid dishes labeled as brined, pickled, cured, or smoked. Avoid dishes made with soy sauce, miso, or teriyaki sauce. Avoid foods that have monosodium glutamate (MSG) in them. MSG may be added to some restaurant food, sauces, soups, bouillon, and canned foods. Make meals that can be grilled, baked, poached, roasted, or steamed. These are often made with less sodium. General information Try to limit your sodium intake to 1,500-2,300 mg each day, or the amount told by your provider. What foods should I eat? Fruits Fresh, frozen, or canned fruit. Fruit juice. Vegetables Fresh or frozen vegetables. "No salt added" canned vegetables. "No salt added" tomato sauce and paste. Low-sodium or reduced-sodium tomato and vegetable juice. Grains Low-sodium cereals, such as oats, puffed wheat and rice, and shredded wheat. Low-sodium crackers. Unsalted rice. Unsalted pasta. Low-sodium bread. Whole grain breads and whole grain pasta. Meats and other proteins Fresh or frozen meat, poultry, seafood, and fish. These should have no added salt. Low-sodium canned tuna and salmon. Unsalted nuts. Dried  peas, beans, and lentils without added salt. Unsalted canned beans. Eggs. Unsalted nut butters. Dairy Milk. Soy milk. Cheese that is naturally low in sodium, such as ricotta cheese, fresh mozzarella, or Swiss cheese. Low-sodium or reduced-sodium cheese. Cream cheese. Yogurt. Seasonings and condiments Fresh and dried herbs and spices. Salt-free seasonings. Low-sodium mustard and ketchup. Sodium-free salad dressing. Sodium-free light mayonnaise. Fresh or refrigerated horseradish. Lemon juice. Vinegar. Other foods Homemade, reduced-sodium, or low-sodium soups. Unsalted popcorn and pretzels. Low-salt or salt-free chips. The items listed above may not be all the foods and drinks you can have. Talk to a dietitian to learn more. What foods should I  avoid? Vegetables Sauerkraut, pickled vegetables, and relishes. Olives. [place] fries. Onion rings. Regular canned vegetables, except low-sodium or reduced-sodium items. Regular canned tomato sauce and paste. Regular tomato and vegetable juice. Frozen vegetables in sauces. Grains Instant hot cereals. Bread stuffing, pancake, and biscuit mixes. Croutons. Seasoned rice or pasta mixes. Noodle soup cups. Boxed or frozen macaroni and cheese. Regular salted crackers. Self-rising flour. Meats and other proteins Meat or fish that is salted, canned, smoked, spiced, or pickled. Precooked or cured meat, such as sausages or meat loaves. Tomasa Blase. Ham. Pepperoni. Hot dogs. Corned beef. Chipped beef. Salt pork. Jerky. Pickled herring, anchovies, and sardines. Regular canned tuna. Salted nuts. Dairy Processed cheese and cheese spreads. Hard cheeses. Cheese curds. Blue cheese. Feta cheese. String cheese. Regular cottage cheese. Buttermilk. Canned milk. Fats and oils Salted butter. Regular margarine. Ghee. Bacon fat. Seasonings and condiments Onion salt, garlic salt, seasoned salt, table salt, and sea salt. Canned and packaged gravies. Worcestershire sauce. Tartar sauce. Barbecue sauce. Teriyaki sauce. Soy sauce, including reduced-sodium soy sauce. Steak sauce. Fish sauce. Oyster sauce. Cocktail sauce. Horseradish that you find on the shelf. Regular ketchup and mustard. Meat flavorings and tenderizers. Bouillon cubes. Hot sauce. Pre-made or packaged marinades. Pre-made or packaged taco seasonings. Relishes. Regular salad dressings. Salsa. Other foods Salted popcorn and pretzels. Corn chips and puffs. Potato and tortilla chips. Canned or dried soups. Pizza. Frozen entrees and pot pies. The items listed above may not be all the foods and drinks you should avoid. Talk to a dietitian to learn more. This information is not intended to replace advice given to you by your health care provider. Make sure you discuss any  questions you have with your health care provider. Document Revised: 07/24/2022 Document Reviewed: 07/24/2022 Elsevier Patient Education  2024 [employer].

## 2023-01-06 DIAGNOSIS — I10 Essential (primary) hypertension: Secondary | ICD-10-CM | POA: Diagnosis not present

## 2023-01-06 DIAGNOSIS — E785 Hyperlipidemia, unspecified: Secondary | ICD-10-CM | POA: Diagnosis not present

## 2023-01-08 DIAGNOSIS — N281 Cyst of kidney, acquired: Secondary | ICD-10-CM | POA: Diagnosis not present

## 2023-01-30 ENCOUNTER — Other Ambulatory Visit (HOSPITAL_COMMUNITY): Payer: Self-pay | Admitting: *Deleted

## 2023-02-03 ENCOUNTER — Ambulatory Visit (HOSPITAL_COMMUNITY)
Admission: RE | Admit: 2023-02-03 | Discharge: 2023-02-03 | Disposition: A | Discharge status: Home / Self Care | Payer: Medicare Other | Source: Ambulatory Visit | Attending: Endocrinology | Admitting: Endocrinology

## 2023-02-03 DIAGNOSIS — M810 Age-related osteoporosis without current pathological fracture: Secondary | ICD-10-CM | POA: Insufficient documentation

## 2023-02-03 MED ORDER — DENOSUMAB 60 MG/ML ~~LOC~~ SOSY: 60.0000 mg | PREFILLED_SYRINGE | Freq: Once | SUBCUTANEOUS | Status: AC

## 2023-02-03 MED ORDER — DENOSUMAB 60 MG/ML ~~LOC~~ SOSY
PREFILLED_SYRINGE | SUBCUTANEOUS | Status: AC
  Administered 2023-02-03: 60 mg via SUBCUTANEOUS
  Filled 2023-02-03: qty 1

## 2023-02-16 ENCOUNTER — Other Ambulatory Visit: Payer: Self-pay | Admitting: Endocrinology

## 2023-02-16 DIAGNOSIS — Z1231 Encounter for screening mammogram for malignant neoplasm of breast: Secondary | ICD-10-CM

## 2023-03-02 DIAGNOSIS — N390 Urinary tract infection, site not specified: Secondary | ICD-10-CM | POA: Diagnosis not present

## 2023-03-24 ENCOUNTER — Ambulatory Visit: Payer: Medicare Other

## 2023-03-27 ENCOUNTER — Other Ambulatory Visit: Payer: Self-pay | Admitting: Adult Health

## 2023-03-27 ENCOUNTER — Other Ambulatory Visit: Payer: Medicare Other

## 2023-03-27 ENCOUNTER — Ambulatory Visit
Admission: RE | Admit: 2023-03-27 | Discharge: 2023-03-27 | Disposition: A | Discharge status: Home / Self Care | Payer: Medicare Other | Source: Ambulatory Visit | Attending: Adult Health | Admitting: Adult Health

## 2023-03-27 DIAGNOSIS — R1031 Right lower quadrant pain: Secondary | ICD-10-CM | POA: Diagnosis not present

## 2023-03-27 DIAGNOSIS — K5190 Ulcerative colitis, unspecified, without complications: Secondary | ICD-10-CM | POA: Diagnosis not present

## 2023-03-27 MED ORDER — IOPAMIDOL (ISOVUE-300) INJECTION 61%
200.0000 mL | Freq: Once | INTRAVENOUS | Status: AC | PRN
  Administered 2023-03-27: 100 mL via INTRAVENOUS

## 2023-04-07 DIAGNOSIS — L718 Other rosacea: Secondary | ICD-10-CM | POA: Diagnosis not present

## 2023-04-08 ENCOUNTER — Ambulatory Visit
Admission: RE | Admit: 2023-04-08 | Discharge: 2023-04-08 | Disposition: A | Discharge status: Home / Self Care | Payer: Medicare Other | Source: Ambulatory Visit | Attending: Endocrinology | Admitting: Endocrinology

## 2023-04-08 DIAGNOSIS — Z1231 Encounter for screening mammogram for malignant neoplasm of breast: Secondary | ICD-10-CM

## 2023-04-10 DIAGNOSIS — H02831 Dermatochalasis of right upper eyelid: Secondary | ICD-10-CM | POA: Diagnosis not present

## 2023-04-10 DIAGNOSIS — H02834 Dermatochalasis of left upper eyelid: Secondary | ICD-10-CM | POA: Diagnosis not present

## 2023-04-10 DIAGNOSIS — H353131 Nonexudative age-related macular degeneration, bilateral, early dry stage: Secondary | ICD-10-CM | POA: Diagnosis not present

## 2023-04-10 DIAGNOSIS — H2513 Age-related nuclear cataract, bilateral: Secondary | ICD-10-CM | POA: Diagnosis not present

## 2023-04-13 ENCOUNTER — Other Ambulatory Visit (HOSPITAL_BASED_OUTPATIENT_CLINIC_OR_DEPARTMENT_OTHER): Payer: Self-pay

## 2023-04-13 DIAGNOSIS — R109 Unspecified abdominal pain: Secondary | ICD-10-CM | POA: Diagnosis not present

## 2023-04-13 MED ORDER — INFLUENZA VAC A&B SURF ANT ADJ 0.5 ML IM SUSY
0.5000 mL | PREFILLED_SYRINGE | Freq: Once | INTRAMUSCULAR | 0 refills | Status: AC
  Filled 2023-04-13: qty 0.5, 1d supply, fill #0

## 2023-04-16 DIAGNOSIS — Z681 Body mass index (BMI) 19 or less, adult: Secondary | ICD-10-CM | POA: Diagnosis not present

## 2023-04-16 DIAGNOSIS — Z124 Encounter for screening for malignant neoplasm of cervix: Secondary | ICD-10-CM | POA: Diagnosis not present

## 2023-04-16 DIAGNOSIS — Z01419 Encounter for gynecological examination (general) (routine) without abnormal findings: Secondary | ICD-10-CM | POA: Diagnosis not present

## 2023-04-20 DIAGNOSIS — M8588 Other specified disorders of bone density and structure, other site: Secondary | ICD-10-CM | POA: Diagnosis not present

## 2023-05-20 DIAGNOSIS — L738 Other specified follicular disorders: Secondary | ICD-10-CM | POA: Diagnosis not present

## 2023-05-20 DIAGNOSIS — L080 Pyoderma: Secondary | ICD-10-CM | POA: Diagnosis not present

## 2023-05-25 DIAGNOSIS — E538 Deficiency of other specified B group vitamins: Secondary | ICD-10-CM | POA: Diagnosis not present

## 2023-05-25 DIAGNOSIS — M810 Age-related osteoporosis without current pathological fracture: Secondary | ICD-10-CM | POA: Diagnosis not present

## 2023-05-25 DIAGNOSIS — E785 Hyperlipidemia, unspecified: Secondary | ICD-10-CM | POA: Diagnosis not present

## 2023-05-25 DIAGNOSIS — Z1212 Encounter for screening for malignant neoplasm of rectum: Secondary | ICD-10-CM | POA: Diagnosis not present

## 2023-05-25 DIAGNOSIS — I10 Essential (primary) hypertension: Secondary | ICD-10-CM | POA: Diagnosis not present

## 2023-06-01 DIAGNOSIS — Z1331 Encounter for screening for depression: Secondary | ICD-10-CM | POA: Diagnosis not present

## 2023-06-01 DIAGNOSIS — K5190 Ulcerative colitis, unspecified, without complications: Secondary | ICD-10-CM | POA: Diagnosis not present

## 2023-06-01 DIAGNOSIS — I10 Essential (primary) hypertension: Secondary | ICD-10-CM | POA: Diagnosis not present

## 2023-06-01 DIAGNOSIS — Z1339 Encounter for screening examination for other mental health and behavioral disorders: Secondary | ICD-10-CM | POA: Diagnosis not present

## 2023-06-01 DIAGNOSIS — R7301 Impaired fasting glucose: Secondary | ICD-10-CM | POA: Diagnosis not present

## 2023-06-01 DIAGNOSIS — Z0000 Encounter for general adult medical examination without abnormal findings: Secondary | ICD-10-CM | POA: Diagnosis not present

## 2023-07-01 DIAGNOSIS — H02535 Eyelid retraction left lower eyelid: Secondary | ICD-10-CM | POA: Diagnosis not present

## 2023-07-01 DIAGNOSIS — H16213 Exposure keratoconjunctivitis, bilateral: Secondary | ICD-10-CM | POA: Diagnosis not present

## 2023-07-01 DIAGNOSIS — H02423 Myogenic ptosis of bilateral eyelids: Secondary | ICD-10-CM | POA: Diagnosis not present

## 2023-07-01 DIAGNOSIS — H02532 Eyelid retraction right lower eyelid: Secondary | ICD-10-CM | POA: Diagnosis not present

## 2023-07-01 DIAGNOSIS — H0279 Other degenerative disorders of eyelid and periocular area: Secondary | ICD-10-CM | POA: Diagnosis not present

## 2023-07-02 DIAGNOSIS — N281 Cyst of kidney, acquired: Secondary | ICD-10-CM | POA: Diagnosis not present

## 2023-08-05 ENCOUNTER — Other Ambulatory Visit (HOSPITAL_BASED_OUTPATIENT_CLINIC_OR_DEPARTMENT_OTHER): Payer: Self-pay

## 2023-08-05 MED ORDER — COVID-19 MRNA VAC-TRIS(PFIZER) 30 MCG/0.3ML IM SUSY
0.3000 mL | PREFILLED_SYRINGE | Freq: Once | INTRAMUSCULAR | 0 refills | Status: AC
  Filled 2023-08-05: qty 0.3, 1d supply, fill #0

## 2023-08-10 ENCOUNTER — Other Ambulatory Visit (HOSPITAL_COMMUNITY): Payer: Self-pay | Admitting: *Deleted

## 2023-08-11 ENCOUNTER — Ambulatory Visit (HOSPITAL_COMMUNITY)
Admission: RE | Admit: 2023-08-11 | Discharge: 2023-08-11 | Disposition: A | Discharge status: Home / Self Care | Payer: Medicare Other | Source: Ambulatory Visit | Attending: Endocrinology | Admitting: Endocrinology

## 2023-08-11 DIAGNOSIS — M810 Age-related osteoporosis without current pathological fracture: Secondary | ICD-10-CM | POA: Insufficient documentation

## 2023-08-11 MED ORDER — DENOSUMAB 60 MG/ML ~~LOC~~ SOSY
60.0000 mg | PREFILLED_SYRINGE | Freq: Once | SUBCUTANEOUS | Status: AC
  Administered 2023-08-11: 60 mg via SUBCUTANEOUS

## 2023-08-11 MED ORDER — DENOSUMAB 60 MG/ML ~~LOC~~ SOSY
PREFILLED_SYRINGE | SUBCUTANEOUS | Status: AC
  Filled 2023-08-11: qty 1

## 2023-10-19 DIAGNOSIS — L820 Inflamed seborrheic keratosis: Secondary | ICD-10-CM | POA: Diagnosis not present

## 2023-11-05 DIAGNOSIS — K219 Gastro-esophageal reflux disease without esophagitis: Secondary | ICD-10-CM | POA: Diagnosis not present

## 2023-11-05 DIAGNOSIS — K50111 Crohn's disease of large intestine with rectal bleeding: Secondary | ICD-10-CM | POA: Diagnosis not present

## 2023-12-03 DIAGNOSIS — I10 Essential (primary) hypertension: Secondary | ICD-10-CM | POA: Diagnosis not present

## 2023-12-11 NOTE — Progress Notes (Signed)
 Cardiology Office Note:    Date:  12/15/2023  ID:  Kirsten Rice, DOB September 23, 1945, MRN [id number] PCP: Rosslyn Coons, MD  [facility] Providers Cardiologist:  Arnoldo Lapping, MD     Patient Profile:     Recurrent Takotsubo syndrome Diagnosed with Takotsubo cardiomyopathy in 2007 Recurrence of Takotsubo syndrome in 01/2015 LHC 01/31/2015: No CAD Mitral valve prolapse TTE 01/02/2022: EF 60-65, no RWMA, normal RVSF, mild MR Hypertension Hyperlipidemia      Discussed the use of AI scribe software for clinical note transcription with the patient, who gave verbal consent to proceed. History of Present Illness Kirsten Rice is a 78 y.o. female who returns for follow up of stress induced CM. She was last seen in 11/2022 by Lovette Rud, PA-C. 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   She is here alone.  She has been doing well without chest pain, shortness of breath, or syncope. Her blood pressure at home ranges from 110-124/70-80 mmHg, and her heart rate is typically in the 60s. She remains physically active, walking two and a half miles daily and participating in gym activities like Pilates at [employer]. In terms of social history, she does not smoke and drinks alcohol  in moderation, typically having a glass of wine while cooking. She emphasizes her efforts to maintain a healthy diet and lifestyle.  ROS-See HPI    Studies Reviewed:  EKG Interpretation Date/Time:  Tuesday Dec 15 2023 08:50:54 EDT Ventricular Rate:  65 PR Interval:  192 QRS Duration:  76 QT Interval:  396 QTC Calculation: 411 R Axis:   6  Text Interpretation: Normal sinus rhythm Normal ECG No significant change since last tracing Confirmed by Marlyse Single [phone]) on 12/15/2023 8:53:15 AM   Results LABS - From primary care reviewed via [facility] Total cholesterol: 211 (05/28/2023) Triglycerides: 60 (05/28/2023) HDL: 101 (05/28/2023) LDL: 99 (05/28/2023) Hemoglobin: 13.7  (05/28/2023) Creatinine: 0.69 (05/28/2023) Potassium: 4.7 (05/28/2023) ALT: 11 (05/28/2023)   Risk Assessment/Calculations:   HYPERTENSION CONTROL Vitals:   12/15/23 0846 12/15/23 0904  BP: (!) 148/84 (!) 170/90    The patient's blood pressure is elevated above target today.  In order to address the patient's elevated BP: Blood pressure will be monitored at home to determine if medication changes need to be made.; The blood pressure is usually elevated in clinic.  Blood pressures monitored at home have been optimal.         Physical Exam:  VS:  BP (!) 170/90   Pulse 61   Ht 5\' 7"  (1.702 m)   Wt 116 lb 6.4 oz (52.8 kg)   SpO2 98%   BMI 18.23 kg/m    Wt Readings from Last 3 Encounters:  12/15/23 116 lb 6.4 oz (52.8 kg)  12/18/22 118 lb 3.2 oz (53.6 kg)  12/12/21 116 lb 3.2 oz (52.7 kg)    Constitutional:      Appearance: Healthy appearance. Not in distress.  Neck:     Vascular: No carotid bruit. JVD normal.  Pulmonary:     Breath sounds: Normal breath sounds. No wheezing. No rales.  Cardiovascular:     Normal rate. Regular rhythm.     Murmurs: There is no murmur.  Edema:    Peripheral edema absent.  Abdominal:     Palpations: Abdomen is soft.        Assessment and Plan: Assessment & Plan Takotsubo cardiomyopathy Stress-induced cardiomyopathy documented in 2007 and 2016. Currently asymptomatic with no chest pain,  dyspnea, or syncope. She remains active and manages stress through exercise and lifestyle modifications. - Refill nitroglycerin  prescription for emergency use - Continue carvedilol  6.25 mg twice daily, benazepril  10 mg daily, furosemide  20 mg daily, potassium 20 mEq daily (refills provided for 1 year) - Follow-up 1 year Benign essential HTN Blood pressure elevated in clinical settings, consistent with white coat hypertension. Home readings are normotensive (110-124/70-80 mmHg). - Continue antihypertensive medications: Benazepril  10 mg, carvedilol  6.25 mg  twice daily, furosemide  20 mg daily  - Continue to monitor blood pressure at home   Nonrheumatic mitral valve regurgitation Mild mitral regurgitation on echocardiogram in June 2023.  Consider repeat echocardiogram 1-2 years.        Dispo:  Return in about 1 year (around 12/14/2024) for Routine Follow Up, w/ Dr. Arlester Ladd, or Marlyse Single, PA-C. Signed, Marlyse Single, PA-C

## 2023-12-15 ENCOUNTER — Ambulatory Visit: Attending: Physician Assistant | Admitting: Physician Assistant

## 2023-12-15 ENCOUNTER — Encounter: Payer: Self-pay | Admitting: Physician Assistant

## 2023-12-15 VITALS — BP 170/90 | HR 61 | Ht 67.0 in | Wt 116.4 lb

## 2023-12-15 DIAGNOSIS — I10 Essential (primary) hypertension: Secondary | ICD-10-CM

## 2023-12-15 DIAGNOSIS — I5181 Takotsubo syndrome: Secondary | ICD-10-CM

## 2023-12-15 DIAGNOSIS — I340 Nonrheumatic mitral (valve) insufficiency: Secondary | ICD-10-CM

## 2023-12-15 MED ORDER — FUROSEMIDE 20 MG PO TABS: 20.0000 mg | ORAL_TABLET | Freq: Every day | ORAL | 3 refills | Status: DC

## 2023-12-15 MED ORDER — POTASSIUM CHLORIDE CRYS ER 20 MEQ PO TBCR: EXTENDED_RELEASE_TABLET | ORAL | 3 refills | Status: AC

## 2023-12-15 MED ORDER — NITROGLYCERIN 0.4 MG SL SUBL: 0.4000 mg | SUBLINGUAL_TABLET | SUBLINGUAL | 11 refills | Status: AC | PRN

## 2023-12-15 MED ORDER — BENAZEPRIL HCL 10 MG PO TABS: ORAL_TABLET | ORAL | 3 refills | Status: AC

## 2023-12-15 MED ORDER — CARVEDILOL 6.25 MG PO TABS
ORAL_TABLET | ORAL | 3 refills | Status: AC
Start: 2023-12-15 — End: ?

## 2023-12-15 NOTE — Assessment & Plan Note (Signed)
 Stress-induced cardiomyopathy documented in 2007 and 2016. Currently asymptomatic with no chest pain, dyspnea, or syncope. She remains active and manages stress through exercise and lifestyle modifications. - Refill nitroglycerin  prescription for emergency use - Continue carvedilol  6.25 mg twice daily, benazepril  10 mg daily, furosemide  20 mg daily, potassium 20 mEq daily (refills provided for 1 year) - Follow-up 1 year

## 2023-12-15 NOTE — Patient Instructions (Signed)
 Medication Instructions:  Your physician recommends that you continue on your current medications as directed. Please refer to the Current Medication list given to you today.  *If you need a refill on your cardiac medications before your next appointment, please call your pharmacy*  Lab Work: None ordered  If you have labs (blood work) drawn today and your tests are completely normal, you will receive your results only by: MyChart Message (if you have MyChart) OR A paper copy in the mail If you have any lab test that is abnormal or we need to change your treatment, we will call you to review the results.  Testing/Procedures: None ordered  Follow-Up: At [facility], you and your health needs are our priority.  As part of our continuing mission to provide you with exceptional heart care, our providers are all part of one team.  This team includes your primary Cardiologist (physician) and Advanced Practice Providers or APPs (Physician Assistants and Nurse Practitioners) who all work together to provide you with the care you need, when you need it.  Your next appointment:   1 year(s)  Provider:   Arnoldo Lapping, MD or Marlyse Single, PA-C          We recommend signing up for the patient portal called "MyChart".  Sign up information is provided on this After Visit Summary.  MyChart is used to connect with patients for Virtual Visits (Telemedicine).  Patients are able to view lab/test results, encounter notes, upcoming appointments, etc.  Non-urgent messages can be sent to your provider as well.   To learn more about what you can do with MyChart, go to [email or web address].   Other Instructions

## 2023-12-15 NOTE — Assessment & Plan Note (Signed)
 Blood pressure elevated in clinical settings, consistent with white coat hypertension. Home readings are normotensive (110-124/70-80 mmHg). - Continue antihypertensive medications: Benazepril  10 mg, carvedilol  6.25 mg twice daily, furosemide  20 mg daily  - Continue to monitor blood pressure at home

## 2024-01-31 ENCOUNTER — Other Ambulatory Visit: Payer: Self-pay | Admitting: Physician Assistant

## 2024-02-01 ENCOUNTER — Telehealth (HOSPITAL_COMMUNITY): Payer: Self-pay | Admitting: Pharmacy Technician

## 2024-02-01 NOTE — Telephone Encounter (Signed)
 Auth Submission: NO AUTH NEEDED Site of care: Site of care: [facility] Payer: BCBS MEDICARE Medication & CPT/J Code(s) submitted: Prolia  (Denosumab ) [id number] Diagnosis Code: M81.0 Route of submission (phone, fax, portal):  Phone # Fax # Auth type: Buy/Bill HB Units/visits requested: 60MG  x 2 doses, q 6 mths Reference number: [id number]  Approval from: 02/01/24 to 07/20/24   Per Almarie T, as long as medication is not needed for oncology, no auth needed.  Neidra Girvan, CPhT [facility] [phone]

## 2024-02-03 ENCOUNTER — Other Ambulatory Visit (HOSPITAL_COMMUNITY): Payer: Self-pay | Admitting: *Deleted

## 2024-02-08 ENCOUNTER — Ambulatory Visit (HOSPITAL_COMMUNITY)
Admission: RE | Admit: 2024-02-08 | Discharge: 2024-02-08 | Disposition: A | Discharge status: Home / Self Care | Source: Ambulatory Visit | Attending: Endocrinology | Admitting: Endocrinology

## 2024-02-08 DIAGNOSIS — M810 Age-related osteoporosis without current pathological fracture: Secondary | ICD-10-CM | POA: Diagnosis not present

## 2024-02-08 MED ORDER — DENOSUMAB 60 MG/ML ~~LOC~~ SOSY
60.0000 mg | PREFILLED_SYRINGE | Freq: Once | SUBCUTANEOUS | Status: AC
  Administered 2024-02-08: 60 mg via SUBCUTANEOUS

## 2024-02-08 MED ORDER — DENOSUMAB 60 MG/ML ~~LOC~~ SOSY
PREFILLED_SYRINGE | SUBCUTANEOUS | Status: AC
  Filled 2024-02-08: qty 1

## 2024-02-17 ENCOUNTER — Other Ambulatory Visit: Payer: Self-pay | Admitting: Physician Assistant

## 2024-02-18 DIAGNOSIS — L820 Inflamed seborrheic keratosis: Secondary | ICD-10-CM | POA: Diagnosis not present

## 2024-02-18 DIAGNOSIS — Z85828 Personal history of other malignant neoplasm of skin: Secondary | ICD-10-CM | POA: Diagnosis not present

## 2024-02-18 DIAGNOSIS — L718 Other rosacea: Secondary | ICD-10-CM | POA: Diagnosis not present

## 2024-03-02 ENCOUNTER — Other Ambulatory Visit: Payer: Self-pay | Admitting: Physician Assistant

## 2024-03-04 ENCOUNTER — Other Ambulatory Visit: Payer: Self-pay | Admitting: Physician Assistant

## 2024-03-04 MED ORDER — FUROSEMIDE 20 MG PO TABS
20.0000 mg | ORAL_TABLET | Freq: Every day | ORAL | 1 refills | Status: AC
Start: 2024-03-04 — End: ?

## 2024-03-07 ENCOUNTER — Other Ambulatory Visit: Payer: Self-pay | Admitting: Endocrinology

## 2024-03-07 DIAGNOSIS — Z1231 Encounter for screening mammogram for malignant neoplasm of breast: Secondary | ICD-10-CM

## 2024-03-15 DIAGNOSIS — K50111 Crohn's disease of large intestine with rectal bleeding: Secondary | ICD-10-CM | POA: Diagnosis not present

## 2024-03-16 DIAGNOSIS — K50111 Crohn's disease of large intestine with rectal bleeding: Secondary | ICD-10-CM | POA: Diagnosis not present

## 2024-04-11 ENCOUNTER — Ambulatory Visit

## 2024-04-13 DIAGNOSIS — L812 Freckles: Secondary | ICD-10-CM | POA: Diagnosis not present

## 2024-04-13 DIAGNOSIS — Z85828 Personal history of other malignant neoplasm of skin: Secondary | ICD-10-CM | POA: Diagnosis not present

## 2024-04-13 DIAGNOSIS — L820 Inflamed seborrheic keratosis: Secondary | ICD-10-CM | POA: Diagnosis not present

## 2024-04-13 DIAGNOSIS — I788 Other diseases of capillaries: Secondary | ICD-10-CM | POA: Diagnosis not present

## 2024-04-13 DIAGNOSIS — L821 Other seborrheic keratosis: Secondary | ICD-10-CM | POA: Diagnosis not present

## 2024-04-18 ENCOUNTER — Ambulatory Visit
Admission: RE | Admit: 2024-04-18 | Discharge: 2024-04-18 | Disposition: A | Source: Ambulatory Visit | Attending: Endocrinology | Admitting: Endocrinology

## 2024-04-18 DIAGNOSIS — Z1231 Encounter for screening mammogram for malignant neoplasm of breast: Secondary | ICD-10-CM

## 2024-04-20 ENCOUNTER — Other Ambulatory Visit (HOSPITAL_BASED_OUTPATIENT_CLINIC_OR_DEPARTMENT_OTHER): Payer: Self-pay

## 2024-04-20 MED ORDER — FLUZONE HIGH-DOSE 0.5 ML IM SUSY
0.5000 mL | PREFILLED_SYRINGE | Freq: Once | INTRAMUSCULAR | 0 refills | Status: AC
Start: 2024-04-20 — End: 2024-04-21
  Filled 2024-04-20: qty 0.5, 1d supply, fill #0

## 2024-05-04 ENCOUNTER — Ambulatory Visit (HOSPITAL_BASED_OUTPATIENT_CLINIC_OR_DEPARTMENT_OTHER): Admitting: Certified Nurse Midwife

## 2024-05-04 ENCOUNTER — Encounter (HOSPITAL_BASED_OUTPATIENT_CLINIC_OR_DEPARTMENT_OTHER): Payer: Self-pay | Admitting: Certified Nurse Midwife

## 2024-05-04 VITALS — BP 162/91 | HR 69 | Ht 67.0 in | Wt 116.2 lb

## 2024-05-04 DIAGNOSIS — Z1331 Encounter for screening for depression: Secondary | ICD-10-CM | POA: Diagnosis not present

## 2024-05-04 DIAGNOSIS — Z01419 Encounter for gynecological examination (general) (routine) without abnormal findings: Secondary | ICD-10-CM

## 2024-05-04 NOTE — Progress Notes (Signed)
 79 y.o. G1P1 Widowed White or Caucasian female here for annual exam.    No LMP recorded. Patient is postmenopausal.          Sexually active: No.  The current method of family planning is abstinence.     Exercising: Yes.    Gym/ health club routine includes light weights, low impact aerobics, and walking on track . Smoker:  no  Health Maintenance: Pap:  06/05/2014 History of abnormal Pap:  no MMG:  04/18/2024 Birads 1 neg Colonoscopy:  05/11/2012 BMD:   02/01/2013 Osteopenia    reports that she has never smoked. She has never used smokeless tobacco. She reports current alcohol  use of about 1.0 standard drink of alcohol  per week. She reports that she does not use drugs.  Past Medical History:  Diagnosis Date   Arthritis    probably a little bit in my knees and hips (01/31/2015)   Basal cell carcinoma    Chronic systolic CHF (congestive heart failure) (HCC)    a. 01/2015 Echo: EF 25-35% in setting of takotsubo cardiomyopathy.   Crohn's disease (HCC)    Deep vein thrombosis (HCC)    pt denies: 04/08/13   Essential hypertension    History of pneumonia    2-3 times (01/31/2015)   Hyperthyroidism    pt denies:  04/08/13   Mitral valve prolapse    a. 01/2015 Echo: Ef 25-35%, triv MR.   Myocardial infarct [facility])    Ocular headache    weekly (01/31/2015)   Persistent dry cough    PONV (postoperative nausea and vomiting) 1970's   when I had my appendix out   Raynaud's disease    Squamous carcinoma    Takotsubo cardiomyopathy    a. 2007 Cath: nl cors, apical ballooning w/ subsequent recovery of LV fxn;  b. 01/2015 NSTEMI/Cath: nl cors, EF 25-25% w/ apical ballooning;  c. 01/2015 echo: EF 30%, septal, apical, mid anterior, inf HK, mildly dil LA/RA.   Telangiectasia     Past Surgical History:  Procedure Laterality Date   APPENDECTOMY  1971   BASAL CELL CARCINOMA EXCISION     forehead, back, legs, arms (01/31/2015)   CARDIAC CATHETERIZATION  2006; 2007   CARDIAC CATHETERIZATION  N/A 01/31/2015   Procedure: Left Heart Cath and Coronary Angiography;  Surgeon: Candyce GORMAN Reek, MD;  Location: [facility] INVASIVE CV LAB;  Service: Cardiovascular;  Laterality: N/A;   COLONOSCOPY     COLONOSCOPY  05/11/2012   Procedure: COLONOSCOPY;  Surgeon: Princella CHRISTELLA Nida, MD;  Location: [facility] ENDOSCOPY;  Service: Endoscopy;  Laterality: N/A;   SHOULDER ARTHROSCOPY W/ ROTATOR CUFF REPAIR Right 1996   SQUAMOUS CELL CARCINOMA EXCISION Left early 1980's   eyelid   TUBAL LIGATION  1973    Current Outpatient Medications  Medication Sig Dispense Refill   acetaminophen  (TYLENOL ) 325 MG tablet Take 650 mg by mouth every 6 (six) hours as needed for mild pain or headache.     aspirin  81 MG tablet Take 81 mg by mouth daily.     balsalazide (COLAZAL ) 750 MG capsule Take 2,250 mg by mouth 3 (three) times daily.     benazepril  (LOTENSIN ) 10 MG tablet TAKE 1 TABLET(10 MG) BY MOUTH DAILY 90 tablet 3   bifidobacterium infantis (ALIGN) capsule Take 1 capsule by mouth daily.     Biotin  2500 MCG CAPS Take 2,500 mcg by mouth daily.      Calcium -Vitamin D  600-200 MG-UNIT per tablet Take 1 tablet by mouth daily.  carvedilol  (COREG ) 6.25 MG tablet TAKE 1 TABLET(6.25 MG) BY MOUTH TWICE DAILY WITH A MEAL 180 tablet 3   Cholecalciferol (VITAMIN D3) 25 MCG (1000 UT) CAPS Take by mouth.     COVID-19 mRNA vaccine, Pfizer, 30 MCG/0.3ML injection Inject into the muscle. 0.3 mL 0   denosumab  (PROLIA ) 60 MG/ML SOSY injection Inject into the skin.     Flaxseed, Linseed, (FLAXSEED OIL PO) Take by mouth.     furosemide  (LASIX ) 20 MG tablet Take 1 tablet (20 mg total) by mouth daily. 90 tablet 1   influenza vaccine adjuvanted (FLUAD ) 0.5 ML injection Inject into the muscle. 0.5 mL 0   nitroGLYCERIN  (NITROSTAT ) 0.4 MG SL tablet Place 1 tablet (0.4 mg total) under the tongue every 5 (five) minutes as needed for chest pain. 25 tablet 11   potassium chloride  SA (KLOR-CON  M) 20 MEQ tablet Take 1 tablet by mouth daily 90 tablet 3    vitamin B-12 (CYANOCOBALAMIN ) 1000 MCG tablet Take 1,000 mcg by mouth daily.     vitamin C  (ASCORBIC ACID ) 500 MG tablet Take 500 mg by mouth daily.     influenza vaccine adjuvanted (FLUAD ) 0.5 ML injection Inject into the muscle. 0.5 mL 0   montelukast (SINGULAIR) 10 MG tablet montelukast 10 mg tablet  TAKE 1 TABLET BY MOUTH EVERY DAY (Patient not taking: Reported on 05/04/2024)     No current facility-administered medications for this visit.    Family History  Problem Relation Age of Onset   Aortic stenosis Father    Hypertension Father    Diabetes Sister    Hyperthyroidism Sister    Hypertension Mother    Diabetes Sister    Hyperthyroidism Sister    Hyperthyroidism Sister    Hyperthyroidism Sister    Colon cancer Neg Hx    Breast cancer Neg Hx     ROS: Constitutional: negative Genitourinary:negative  Exam:   BP (!) 162/91 (BP Location: Left Arm, Patient Position: Sitting, Cuff Size: Normal)   Pulse 69   Ht 5' 7 (1.702 m)   Wt 116 lb 3.2 oz (52.7 kg)   SpO2 100%   BMI 18.20 kg/m   Height: 5' 7 (170.2 cm)  General appearance: alert, cooperative and appears stated age Head: Normocephalic, without obvious abnormality, atraumatic Lungs: clear to auscultation bilaterally Breasts: normal appearance, no masses or tenderness, Inspection negative, No nipple retraction or dimpling, No nipple discharge or bleeding, No axillary or supraclavicular adenopathy Heart: regular rate and rhythm Abdomen: soft, non-tender; bowel sounds normal; no masses,  no organomegaly Extremities: extremities normal, atraumatic, no cyanosis or edema Skin: Skin color, texture, turgor normal. No rashes or lesions Lymph nodes: Cervical, supraclavicular, and axillary nodes normal. No abnormal inguinal nodes palpated Neurologic: Grossly normal   Pelvic: External genitalia:  no lesions              Urethra:  normal appearing urethra with no masses, tenderness or lesions              Bartholins and  Skenes: normal                 Vagina: normal appearing vagina with normal color and no discharge, no lesions              Cervix: no lesions              Pap taken: No. Bimanual Exam:  Uterus:  normal size, contour, position, consistency, mobility, non-tender  Adnexa: no mass, fullness, tenderness               Rectovaginal: Confirms               Anus:  normal sphincter tone, no lesions  Chaperone,  CMA, was present for exam.  Assessment/Plan:  1. Women's annual routine gynecological examination (Primary)  - Continue annual screening mammograms and self breast awareness - Pt stays active and eats a healthy well-balanced diet - Discussed vaccines - RTO 1 year for annual gyn exam and prn if issues arise.  Arland MARLA Roller

## 2024-05-05 NOTE — Progress Notes (Signed)
 Kirsten Rice                                          MRN: [id number]   05/05/2024   The [facility] Quality Team Specialist reviewed this patient medical record for the purposes of chart review for care gap closure. The following were reviewed: chart review for care gap closure-controlling blood pressure.    [facility] Quality Team

## 2024-05-23 DIAGNOSIS — G250 Essential tremor: Secondary | ICD-10-CM | POA: Diagnosis not present

## 2024-05-23 DIAGNOSIS — I10 Essential (primary) hypertension: Secondary | ICD-10-CM | POA: Diagnosis not present

## 2024-05-27 DIAGNOSIS — H02533 Eyelid retraction right eye, unspecified eyelid: Secondary | ICD-10-CM | POA: Diagnosis not present

## 2024-05-27 DIAGNOSIS — H25813 Combined forms of age-related cataract, bilateral: Secondary | ICD-10-CM | POA: Diagnosis not present

## 2024-05-27 DIAGNOSIS — H02831 Dermatochalasis of right upper eyelid: Secondary | ICD-10-CM | POA: Diagnosis not present

## 2024-05-27 DIAGNOSIS — H02536 Eyelid retraction left eye, unspecified eyelid: Secondary | ICD-10-CM | POA: Diagnosis not present

## 2024-05-31 DIAGNOSIS — E538 Deficiency of other specified B group vitamins: Secondary | ICD-10-CM | POA: Diagnosis not present

## 2024-06-07 DIAGNOSIS — I10 Essential (primary) hypertension: Secondary | ICD-10-CM | POA: Diagnosis not present

## 2024-06-07 DIAGNOSIS — I2510 Atherosclerotic heart disease of native coronary artery without angina pectoris: Secondary | ICD-10-CM | POA: Diagnosis not present

## 2024-06-07 DIAGNOSIS — R82998 Other abnormal findings in urine: Secondary | ICD-10-CM | POA: Diagnosis not present

## 2024-06-09 DIAGNOSIS — L821 Other seborrheic keratosis: Secondary | ICD-10-CM | POA: Diagnosis not present

## 2024-06-09 DIAGNOSIS — L718 Other rosacea: Secondary | ICD-10-CM | POA: Diagnosis not present

## 2024-06-09 DIAGNOSIS — B078 Other viral warts: Secondary | ICD-10-CM | POA: Diagnosis not present

## 2024-06-09 DIAGNOSIS — Z85828 Personal history of other malignant neoplasm of skin: Secondary | ICD-10-CM | POA: Diagnosis not present

## 2024-08-26 ENCOUNTER — Other Ambulatory Visit (HOSPITAL_BASED_OUTPATIENT_CLINIC_OR_DEPARTMENT_OTHER): Payer: Self-pay
